# Patient Record
Sex: Female | Born: 1960 | Race: White | Hispanic: No | Smoking: Never smoker
Health system: Southern US, Community
[De-identification: ages and names within clinical notes are randomized; demographics above are authoritative.]

## PROBLEM LIST (undated history)

## (undated) DIAGNOSIS — I10 Essential (primary) hypertension: Secondary | ICD-10-CM

## (undated) DIAGNOSIS — H8109 Meniere's disease, unspecified ear: Secondary | ICD-10-CM

## (undated) DIAGNOSIS — F32A Depression, unspecified: Secondary | ICD-10-CM

## (undated) DIAGNOSIS — D649 Anemia, unspecified: Secondary | ICD-10-CM

## (undated) DIAGNOSIS — E039 Hypothyroidism, unspecified: Secondary | ICD-10-CM

## (undated) DIAGNOSIS — K529 Noninfective gastroenteritis and colitis, unspecified: Secondary | ICD-10-CM

## (undated) DIAGNOSIS — M1990 Unspecified osteoarthritis, unspecified site: Secondary | ICD-10-CM

## (undated) DIAGNOSIS — R52 Pain, unspecified: Secondary | ICD-10-CM

## (undated) DIAGNOSIS — G8929 Other chronic pain: Secondary | ICD-10-CM

## (undated) DIAGNOSIS — E1140 Type 2 diabetes mellitus with diabetic neuropathy, unspecified: Secondary | ICD-10-CM

## (undated) DIAGNOSIS — E7800 Pure hypercholesterolemia, unspecified: Secondary | ICD-10-CM

## (undated) DIAGNOSIS — F419 Anxiety disorder, unspecified: Secondary | ICD-10-CM

## (undated) DIAGNOSIS — K7460 Unspecified cirrhosis of liver: Secondary | ICD-10-CM

## (undated) DIAGNOSIS — M545 Low back pain, unspecified: Secondary | ICD-10-CM

## (undated) DIAGNOSIS — J189 Pneumonia, unspecified organism: Secondary | ICD-10-CM

## (undated) DIAGNOSIS — K5792 Diverticulitis of intestine, part unspecified, without perforation or abscess without bleeding: Secondary | ICD-10-CM

## (undated) DIAGNOSIS — K589 Irritable bowel syndrome without diarrhea: Secondary | ICD-10-CM

## (undated) DIAGNOSIS — K219 Gastro-esophageal reflux disease without esophagitis: Secondary | ICD-10-CM

## (undated) DIAGNOSIS — G629 Polyneuropathy, unspecified: Secondary | ICD-10-CM

## (undated) DIAGNOSIS — K760 Fatty (change of) liver, not elsewhere classified: Secondary | ICD-10-CM

## (undated) DIAGNOSIS — F329 Major depressive disorder, single episode, unspecified: Secondary | ICD-10-CM

## (undated) HISTORY — DX: Anxiety disorder, unspecified: F41.9

## (undated) HISTORY — DX: Other chronic pain: G89.29

## (undated) HISTORY — DX: Depression, unspecified: F32.A

## (undated) HISTORY — DX: Low back pain, unspecified: M54.50

## (undated) HISTORY — DX: Gastro-esophageal reflux disease without esophagitis: K21.9

## (undated) HISTORY — DX: Diverticulitis of intestine, part unspecified, without perforation or abscess without bleeding: K57.92

## (undated) HISTORY — PX: COLONOSCOPY: SHX174

## (undated) HISTORY — DX: Irritable bowel syndrome, unspecified: K58.9

## (undated) HISTORY — PX: OVARIAN CYST REMOVAL: SHX89

## (undated) HISTORY — DX: Fatty (change of) liver, not elsewhere classified: K76.0

## (undated) HISTORY — PX: ABDOMINAL HYSTERECTOMY: SHX81

## (undated) HISTORY — DX: Low back pain: M54.5

## (undated) HISTORY — DX: Essential (primary) hypertension: I10

## (undated) HISTORY — DX: Type 2 diabetes mellitus with diabetic neuropathy, unspecified: E11.40

## (undated) HISTORY — PX: URETERAL STENT PLACEMENT: SHX822

---
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
## 1994-10-06 HISTORY — PX: ECTOPIC PREGNANCY SURGERY: SHX613

## 2001-03-12 ENCOUNTER — Inpatient Hospital Stay (HOSPITAL_COMMUNITY): Admission: EM | Admit: 2001-03-12 | Discharge: 2001-03-15 | Payer: Self-pay | Admitting: Psychiatry

## 2001-06-03 ENCOUNTER — Inpatient Hospital Stay (HOSPITAL_COMMUNITY): Admission: EM | Admit: 2001-06-03 | Discharge: 2001-06-09 | Payer: Self-pay | Admitting: *Deleted

## 2002-09-20 ENCOUNTER — Emergency Department (HOSPITAL_COMMUNITY): Admission: EM | Admit: 2002-09-20 | Discharge: 2002-09-20 | Payer: Self-pay | Admitting: Emergency Medicine

## 2003-02-19 ENCOUNTER — Encounter: Payer: Self-pay | Admitting: Emergency Medicine

## 2003-02-19 ENCOUNTER — Emergency Department (HOSPITAL_COMMUNITY): Admission: EM | Admit: 2003-02-19 | Discharge: 2003-02-19 | Payer: Self-pay | Admitting: Emergency Medicine

## 2003-07-04 ENCOUNTER — Encounter: Payer: Self-pay | Admitting: *Deleted

## 2003-07-04 ENCOUNTER — Emergency Department (HOSPITAL_COMMUNITY): Admission: EM | Admit: 2003-07-04 | Discharge: 2003-07-04 | Payer: Self-pay | Admitting: *Deleted

## 2003-07-08 ENCOUNTER — Emergency Department (HOSPITAL_COMMUNITY): Admission: EM | Admit: 2003-07-08 | Discharge: 2003-07-08 | Payer: Self-pay | Admitting: Emergency Medicine

## 2003-09-02 ENCOUNTER — Emergency Department (HOSPITAL_COMMUNITY): Admission: EM | Admit: 2003-09-02 | Discharge: 2003-09-03 | Payer: Self-pay | Admitting: *Deleted

## 2003-10-07 HISTORY — PX: PARTIAL HYSTERECTOMY: SHX80

## 2003-10-07 HISTORY — PX: OTHER SURGICAL HISTORY: SHX169

## 2003-10-19 ENCOUNTER — Emergency Department (HOSPITAL_COMMUNITY): Admission: EM | Admit: 2003-10-19 | Discharge: 2003-10-20 | Payer: Self-pay | Admitting: *Deleted

## 2003-11-10 ENCOUNTER — Encounter: Payer: Self-pay | Admitting: Cardiology

## 2003-11-14 ENCOUNTER — Ambulatory Visit (HOSPITAL_COMMUNITY): Admission: RE | Admit: 2003-11-14 | Discharge: 2003-11-14 | Payer: Self-pay | Admitting: Urology

## 2003-11-15 ENCOUNTER — Ambulatory Visit (HOSPITAL_COMMUNITY): Admission: RE | Admit: 2003-11-15 | Discharge: 2003-11-15 | Payer: Self-pay | Admitting: Cardiology

## 2003-12-05 ENCOUNTER — Observation Stay (HOSPITAL_COMMUNITY): Admission: RE | Admit: 2003-12-05 | Discharge: 2003-12-06 | Payer: Self-pay | Admitting: Urology

## 2004-03-01 ENCOUNTER — Emergency Department (HOSPITAL_COMMUNITY): Admission: EM | Admit: 2004-03-01 | Discharge: 2004-03-01 | Payer: Self-pay | Admitting: Emergency Medicine

## 2005-04-16 ENCOUNTER — Ambulatory Visit: Payer: Self-pay | Admitting: Internal Medicine

## 2005-04-17 ENCOUNTER — Ambulatory Visit (HOSPITAL_COMMUNITY): Admission: RE | Admit: 2005-04-17 | Discharge: 2005-04-17 | Payer: Self-pay | Admitting: Internal Medicine

## 2005-05-01 ENCOUNTER — Ambulatory Visit: Payer: Self-pay | Admitting: Internal Medicine

## 2005-05-12 ENCOUNTER — Ambulatory Visit: Payer: Self-pay | Admitting: Internal Medicine

## 2006-10-06 HISTORY — PX: UPPER GASTROINTESTINAL ENDOSCOPY: SHX188

## 2007-07-20 ENCOUNTER — Ambulatory Visit: Payer: Self-pay | Admitting: Internal Medicine

## 2007-07-29 ENCOUNTER — Ambulatory Visit (HOSPITAL_COMMUNITY): Admission: RE | Admit: 2007-07-29 | Discharge: 2007-07-29 | Payer: Self-pay | Admitting: Internal Medicine

## 2007-08-04 ENCOUNTER — Ambulatory Visit (HOSPITAL_COMMUNITY): Admission: RE | Admit: 2007-08-04 | Discharge: 2007-08-04 | Payer: Self-pay | Admitting: Internal Medicine

## 2007-08-04 ENCOUNTER — Ambulatory Visit: Payer: Self-pay | Admitting: Internal Medicine

## 2007-08-04 ENCOUNTER — Encounter: Payer: Self-pay | Admitting: Internal Medicine

## 2007-08-12 ENCOUNTER — Ambulatory Visit (HOSPITAL_COMMUNITY): Admission: RE | Admit: 2007-08-12 | Discharge: 2007-08-12 | Payer: Self-pay | Admitting: Internal Medicine

## 2007-10-07 HISTORY — PX: OTHER SURGICAL HISTORY: SHX169

## 2008-09-06 ENCOUNTER — Encounter: Payer: Self-pay | Admitting: Cardiology

## 2008-09-13 ENCOUNTER — Encounter: Payer: Self-pay | Admitting: Cardiology

## 2008-12-28 ENCOUNTER — Encounter: Payer: Self-pay | Admitting: Cardiology

## 2009-01-11 ENCOUNTER — Encounter: Payer: Self-pay | Admitting: Cardiology

## 2010-05-06 HISTORY — PX: CHOLECYSTECTOMY: SHX55

## 2010-07-24 ENCOUNTER — Encounter (INDEPENDENT_AMBULATORY_CARE_PROVIDER_SITE_OTHER): Payer: Self-pay | Admitting: *Deleted

## 2010-10-27 ENCOUNTER — Encounter: Payer: Self-pay | Admitting: Internal Medicine

## 2010-11-05 NOTE — Letter (Signed)
Summary: Recall, Screening Colonoscopy Only  [facility]  [address]   [place], [place] [address]   Phone: [phone]  Fax: [phone]    July 24, 2010  GARGI BERCH [address] [place], [place]  [address] [birth year]   Dear Ms. Fugere,   Our records indicate it is time to schedule your colonoscopy.    Please call our office at [phone] and ask for the nurse.   Thank you,    Burnadette Peter, LPN Waldon Merl, [facility] Ph: [phone]   Fax: [phone]

## 2011-02-18 NOTE — H&P (Signed)
NAMEILHAM, ROUGHTON                ACCOUNT NO.:  [id number]   MEDICAL RECORD NO.:  [id number]         PATIENT TYPE:  AMB   LOCATION:  DAY                           FACILITY:  [facility]   PHYSICIAN:  R. Garfield Cornea, M.D. DATE OF BIRTH:  [birth year]   DATE OF ADMISSION:  DATE OF DISCHARGE:  LH                              HISTORY & PHYSICAL   CHIEF COMPLAINT:  Left upper quadrant pain, abdominal bloating,  diarrhea.   HISTORY OF PRESENT ILLNESS:  Ms. Jacquet is a 50 year old Caucasian  female.  She was previously a patient of Dr. Laural Golden with history of IBS.  She was noted to have Hemoccult-positive stool back in 2006.  She also  has history of chronic GERD.  She was advised to have a colonoscopy and  EGD; however, she has not yet done this.  She also had bout of hepatitis  with acute hepatitis and transaminitis possibly due to the fact that she  was on Lipitor at that time.  HCV RNA was negative. She tells me about 5  weeks ago she developed severe left upper quadrant pain along with  abdominal bloating.  She describes the pain as constant sticking pain.  She rates it 9/10 at worst. She was admitted to the hospital 3 months  ago with similar symptoms that also included nausea and diarrhea as well  as left lower quadrant pain.  She was followed by Dr. Woody Seller at [facility].  She tells me she has upwards of five to seven bowel  movements per day.  Denies any rectal bleeding or melena.  Denies any  mucus in her stool.  She continues to complain of nausea and has been  having vomiting as well.  She is diabetic. Her blood sugars have been  elevated. She describes them in the 160-200 range. She is followed by  Dr. Woody Seller for this as well.  The pain is worsened with eating or  drinking. She said it is relieved with lying prone.  She takes  omeprazole 20 mg daily for her chronic GERD which is well controlled at  this time. She also takes hyoscyamine b.i.d.  This does not seem to help  with her diarrhea.  She denies any foreign travel.  She has chronically  been on antibiotics over the last couple of months. She rarely takes  ibuprofen.  She denies any foreign travel.   PAST MEDICAL AND SURGICAL HISTORY:  1. She has chronic low back pain with her history of herniated disk.  2. Diabetes mellitus.  3. Hypertension.  4. Hyperlipidemia.  5. Anxiety.  6. Chronic GERD for over 4 years.  7. Diabetic neuropathy.  8. History of ectopic pregnancy.  9. Bladder tack.  10.Partial hysterectomy.   CURRENT MEDICATIONS:  1. Xanax  0.5 mg nightly p.r.n.  2. Ibuprofen 600 mg p.r.n.  3. Imitrex p.r.n.  4. Multivitamin daily.  5. Glyburide/metformin 5/500 mg b.i.d.  6. Gabapentin 300 mg 3 times a day.  7. Omeprazole 20 mg daily.  8. Metoprolol 25 mg daily.  9. Hyoscyamine 0.37 mg b.i.d.  10.Tekturna 30 mg  daily.  11.Triamterene/hydrochlorothiazide 37.5/25 mg daily.  12.Meninx daily.  13.TENS unit p.r.n.  14.A fluid pill.  She does not know the name but takes 1/2  p.r.n.   ALLERGIES:  No known drug allergies.   FAMILY HISTORY:  No known family history of carcinoma, liver or chronic  GI problems.  Mother, age 24. has history of coronary artery disease and  respiratory disease; and father deceased at age 53 secondary to MI.  Brother had a history of a lymphoma in remission. Sisters in good  health.   SOCIAL HISTORY:  Ms. Olden is divorced.  She has two healthy children,  ages 55 and 56.  She is a part-time school substitute.  Denies any  tobacco or drug use.  She occasionally drinks a glass of wine.   REVIEW OF SYSTEMS:  See HPI.  CONSTITUTIONAL:  Weight has remained  stable.  Otherwise negative.   PHYSICAL EXAMINATION:  VITAL SIGNS:  Weight 190.5 pounds, height 63  inches.  Temperature 98.6,  blood pressure 124/94, pulse 60.  GENERAL:  Ms. Petterson is an obese Caucasian female who is alert, pleasant,  cooperative, no acute distress.  HEENT:  Conjunctivae pink.  Oropharynx  pink and moist without lesions.  NECK:  Supple without mass or thyromegaly.  HEART:  Regular rate and  rhythm.  Normal S1 and S2. No murmurs, clicks,  rubs or gallops.  LUNGS:  clear to auscultation bilaterally.  ABDOMEN:  Positive bowel sounds x4.  No bruits auscultated.  Soft,  mildly distended.  She has been moderate tenderness left upper quadrant  on deep palpation.  There is no rebound tenderness or guarding.  She  also had tenderness to the left lower rib cage on palpation.  EXTREMITIES:  Without clubbing or edema bilaterally.  SKIN:  Pink, warm and dry.  RECTAL:  No external lesions visualized.  Good sphincter tone.  No  internal masses palpated. Small amount of light brown stool obtained  from vault which is hemoccult positive.   IMPRESSION:  Ms. Weisse is a 50 year old Caucasian female with 5-week  history of left upper quadrant pain along with diarrhea, abdominal  bloating.  I suspect her symptoms could all be related to irritable  bowel syndrome; however, she is found hemoccult positive on exam today.  She also has history of being Hemoccult positive back 2 years ago.  She  never proceeded with colonoscopy as advised.  I would like for her to  proceed with colonoscopy at this time for further evaluation to rule out  inflammatory bowel disease and colorectal carcinoma.  She also has  chronic gastroesophageal reflux disease and should undergo colonoscopy  to live for erosive reflux esophagitis, gastritis, or peptic ulcer  disease as the culprit of her symptoms as well.  We should also rule out  pancreatitis and diverticulitis, although I feel that these are least  likely.   PLAN:  1. Check CBC, LFTs, amylase, lipase, and MET-7.  2. Stool studies to include ova and parasite, culture and sensitivity,      C. difficile and lactoferrin.  3. Colonoscopy and EGD with Dr. Gala Romney in the near future.  I discussed      both procedures including risks, benefits, to include but not       limited to blood, infection, perforation, drug reaction.  This plan      accepted.      Consent obtained.  She should take one-half of her metformin the      day prior  to the procedure.  4. Vicodin 5/500 mg one p.o. q. 4-6 h p.r.n. severe pain, #10 with no      refill.   I would like to thank Dr. Woody Seller for allowing [place] to participate in the  care of Ms. Dobbs.      Vickey Huger, N.P.      Bridgette Habermann, M.D.  Electronically Signed    KJ/MEDQ  D:  07/20/2007  T:  07/20/2007  Job:  [id number]

## 2011-02-18 NOTE — Op Note (Signed)
[name], [name]                ACCOUNT NO.:  [id number]   MEDICAL RECORD NO.:  [id number]          PATIENT TYPE:  AMB   LOCATION:  DAY                           FACILITY:  [facility]   PHYSICIAN:  R. Garfield Cornea, M.D. DATE OF BIRTH:  05/04/1961   DATE OF PROCEDURE:  08/04/2007  DATE OF DISCHARGE:                               OPERATIVE REPORT   PROCEDURE:  Esophagogastroduodenoscopy with small bowel biopsy followed  by ileocolonoscopy and segmental biopsies, snare polypectomy.   INDICATIONS FOR PROCEDURE:  A 50 year old lady with upper abdominal  pain, 82-monthhistory of watery nonbloody diarrhea, Hemoccult positive.  She has diabetes. EGD and colonoscopy now being done.  This approach has  discussed with the patient at length.  Potential risks, benefits and  alternatives have been reviewed, questions answered.  She is agreeable.  Please see documentation in the medical record.   PROCEDURE NOTE:  O2 saturation, blood pressure, pulse and respirations  were monitored throughout the entire procedure.   CONSCIOUS SEDATION:  Versed 6 mg IV, Demerol 150 mg IV in divided doses.   INSTRUMENT:  Pentax video chip system.   Cetacaine spray for topical pharyngeal anesthesia.   FINDINGS:  EGD:  Examination tubular esophagus revealed no mucosal  abnormalities, EG junction easily traversed.  Stomach:  Gas cavity was emptied, insufflated well with air.  Thorough  examination of gastric mucosa including retroflex view of the proximal  stomach, esophagogastric junction demonstrated only a small hiatal  hernia.  Pylorus patent, easily traversed, examination of the bulb,  second, third portion revealed some white tiny specks overlying the  small bowel mucosa which not would not wash or rub off, please see  photos.  There was no scalloping or other abnormality.   THERAPEUTIC/DIAGNOSTIC MANEUVERS PERFORMED:  Multiple biopsies D2 and D3  were taken.  The patient tolerated the procedure well and was  prepared  for colonoscopy.   Digital rectal exam revealed no abnormalities.   ENDOSCOPIC FINDINGS:  The prep was suboptimal but doable.  Colon:  Colonic mucosa was surveyed from rectosigmoid junction through  the left, transverse, right colon to this appendiceal orifice and  ileocecal valve and cecum.  These structures well seen and photographed  for the record.  The ileocecal valve was patulous, but otherwise  appeared normal.  The terminal ileum was easily intubated 10 cm.  From  this level scope was slowly withdrawn.  All previously mentioned mucosal  surfaces were again seen.  The patient had sigmoid diverticula.  The  remainder of the colonic mucosa appeared normal except for 4 mm  pedunculated polyp mid ascending colon which was cold snared, recovered  through scope.  The terminal ileal mucosa appeared normal.  Single  biopsies of the ascending, descending and sigmoid were taken to rule out  microscopic colitis.  Scope was then pulled down to the rectum where  thorough examination of rectal mucosa including retroflex view of the  anal verge demonstrated no abnormalities.  Rectal mucosa was biopsied  separately.  The patient tolerated both procedures well and was reacted  in endoscopy.   IMPRESSION:  Esophagogastroduodenoscopy:  Normal esophagus, small hiatal  hernia, otherwise normal stomach, patent pylorus D1 through D3 closely  examined and tiny white specks, hundreds of them lining part of the  mucosa D2 and D3.  These could be prominent lymphatics versus other  abnormality status post biopsy.   COLONOSCOPY FINDINGS:  1. Normal rectum.  2. Sigmoid diverticula.  Remainder of colonic mucosa appeared normal      aside from ascending colon polyp which was removed cold snare,      status post segmental biopsy.  3. Patulous but otherwise normal-appearing ileocecal valve, normal      terminal ileum.   RECOMMENDATIONS:  Follow up on pending histology, make further   recommendations to follow in very near future.      [name], M.D.  Electronically Signed     RMR/MEDQ  D:  08/04/2007  T:  08/04/2007  Job:  [id number]   cc:   [name]  Fax: [phone]

## 2011-02-21 NOTE — Discharge Summary (Signed)
[facility]  Patient:    [name], [name]                         MRN: [id number] Adm. Date:  [id number] Disc. Date: [id number] Attending:  Carlton Adam A Dictator:   Harlin Rain. Orsini, N.P.                           Discharge Summary  PATIENT IDENTIFICATION:  This is a 50 year old divorced white female voluntarily admitted for depression and suicide attempt with patient overdosing on multiple medications.  The patient presents with a history of depression for approximately six months, having several stressors with relationships, being unemployed and poor family support. The patient feels very hopeless and worthless. The patient states she took whatever pill she could get; took three Paxil, two or three trazodone, five or six unknown white pills that her boyfriend had bought on the streets.  The patient was asleep and found by her child.  She was taken to the emergency room.  The patient reports that she only wanted "to sleep and not wake up."  There was no suicide note left.  The patient reports several other prior overdoses, never been hospitalized, with the last one prior to this admission.  The patient reports she sleeps when she takes her medications.  Her appetite has been good.  She denies any psychotic symptoms.  The patient goes to [facility] on an outpatient basis and sees Rhoderick Moody, N.P., for the past six months.  PAST MEDICAL HISTORY:  Primary care Dariona Postma is Dr. Evie Lacks, in [place], [place].  Medical problems:  None.  MEDICATIONS: 1. Paxil CR 50 mg q.d. 2. Trazodone 50 mg q.h.s.  ALLERGIES:  No known drug allergies.  PHYSICAL FINDINGS:  Urine drug screen was positive for cocaine.  CMET and CBC were within normal limits.  Urine showed positive protein, positive blood.  EKG was normal.  MENTAL STATUS EXAMINATION:  She is alert, overweight Caucasian female, casually dressed, good eye contact and cooperative.  Speech is normal and relevant.  Mood is depressed.  Affect is apathetic.  Thought processes are coherent. There is no evidence of psychosis.  No hallucinations.  No current homicidal or suicidal ideations, no paranoia.  Cognitive functioning is intact. She is oriented x 3.  Memory is good.  Judgement is poor.  Insight is poor.  Poor impulse control.  Appears to be reliable.  ADMISSION DIAGNOSES: Axis I.   1. Major depression with suicide attempt.           2. Polysubstance abuse. Axis II.  Deferred. Axis III. None. Axis IV.  Severe with problems related to primary support group,           occupation and Conservation officer, nature. Axis V.   Current is 41, past year is 60.  HOSPITAL COURSE:  This is a voluntary admission.  The patient will be monitored every 15 minutes and will resume her routine medications and obtain further labs. Will get a urine culture and sensitivity.  The patient initially was very sad and depressed and remaining apathetic with a flat affect. Discussions were undertaken with her about overdosing that were resulted in her admission.  The patient reported that she was not having thoughts about herself and talked about her multiple psychosocial stressors. The patient began to improve, becoming less depressed and brighter affect. She was denying any suicidal ideation and was wanting  to go home to be with her children and felt better able to handle her stressors.  Her Paxil was increased.  CONDITION ON DISCHARGE:  The patient was feeling much better. She had a good visit with her children and felt that she was ready for discharge and was going to follow up with outpatient therapy at [facility].  The patient was denying any homicidal or suicidal ideations.  FOLLOW-UP:  This is to be at [facility] on March 16, 2001, at 9:30 to see Lorin Glass.  DISCHARGE MEDICATIONS: 1. Paxil CR 25 mg two in the morning. 2. Trazodone 50 mg one to two p.r.n. for  sleep.  DISCHARGE DIAGNOSES: Axis I.   1. Major depression with suicide attempt.           2. Polysubstance abuse. Axis II.  Deferred. Axis III. None. Axis IV.  Severe psychosocial stressors. Axis V.   Current is 60, past year is 69. DD:  04/19/01 TD:  04/19/01 Job: [id number]

## 2011-02-21 NOTE — Procedures (Signed)
NAME:  [name], [name]                          ACCOUNT NO.:  [id number]   MEDICAL RECORD NO.:  [id number]                   PATIENT TYPE:  OUT   LOCATION:  RAD                                  FACILITY:  [facility]   PHYSICIAN:  Jacqulyn Ducking, M.D.               DATE OF BIRTH:  November 11, 1960   DATE OF PROCEDURE:  DATE OF DISCHARGE:                                  ECHOCARDIOGRAM   </REFERRING PHYSICIANS>  Dr. Oleta Mouse.  Dr. Brandon Melnick D. Fanta.   CLINICAL DATA:  A 50 year old woman with chest pain; urologic surgery  planned.   FINDINGS:  1. Baseline echocardiographic images revealed normal chamber dimensions.     The aortic, mitral and tricuspid valves are normal.  Left ventricular     size is normal with normal wall thickness and normal function.  2. Upright treadmill exercise performed to a workload of 10 mets, and a     heart rate of 194, 109% of age-predicted maximum.  Exercise discontinued     due to dyspnea and fatigue; no chest pain reported.  Blood pressure increased from a resting value of 130/85 to 200/100.  At peak  exercise, a minimally hypertensive response.  No arrhythmias noted.   BASELINE EKG:  1. Normal sinus rhythm.  2. Within normal limits.   STRESS EKG:  Insignificant upsloping ST-segment depression.   POST-EXERCISE ECHOCARDIOGRAPHIC IMAGES:  Hyperdynamic function in all  segments.   IMPRESSION:  1. Negative and adequate stress echocardiogram revealing adequate exercise     tolerance, a normal stress EKG, a mildly hypertensive response to     exertion, and no echocardiographic evidence for ischemic or infarction.  2. Other findings as noted.      ___________________________________________                                            Jacqulyn Ducking, M.D.   RR/MEDQ  D:  11/15/2003  T:  11/15/2003  Job:  [id number]

## 2011-02-21 NOTE — Op Note (Signed)
NAME:  [name], [name]                          ACCOUNT NO.:  [id number]   MEDICAL RECORD NO.:  [id number]                   PATIENT TYPE:  AMB   LOCATION:  DAY                                  FACILITY:  [facility]   PHYSICIAN:  Marissa Nestle, M.D.            DATE OF BIRTH:  1961/05/05   DATE OF PROCEDURE:  DATE OF DISCHARGE:                                 OPERATIVE REPORT   PREOPERATIVE DIAGNOSES:  Stress urinary incontinence.   POSTOPERATIVE DIAGNOSES:  Stress urinary incontinence.   PROCEDURE:  Tension free vaginal tape.   ANESTHESIA:  General.   DESCRIPTION OF PROCEDURE:  The patient was given general anesthesia, placed  in lithotomy position, abdomen prepped and draped.  The suprapubic areas  were marked at the level of the pubic tubercle.  A #20 Foley catheter  inserted and then mid urethra infiltrated with 10 mL of normal saline in the  periureteral area.  A 1 cm long incision of the urethra is made ending about  1.5 cm proximal to the ureteral meatus with blunt dissection.  The  periurethral area was developed enough to accommodate the tip of the trocar.  The trocar with the blue guard is introduced and I have removed the Foley  catheter and inserted a catheter with a Stylet in it to deflect the bladder  neck to the right side and the trocar is introduced left of the mid urethra  into the retropubic space and hugging the back of the pubic symphysis came  out at the level of the pubic tubercle.  With the trocar in place, the Foley  catheter was removed, the bladder was filled up with 300 mL of saline.  It  was inspected to make sure the trocar is outside the bladder.  Once that was  established, the trocar along with the cord is pulled in the suprapubic  area.  Part of the trocar is retained, the other part is cut and the trocar  is introduced in the same exact fashion on the right side. The blue guard is  in on both sides, I have already checked the results in the bladder.  The end  of the tension free vaginal tape is attached to the blue cord on the vaginal  side and both ends of the blue cord pulled up in the suprapubic area pulling  the ends of the tape up in the suprapubic area.  The tape is separated in  the midline and with the Mayo scissor in between the tape and the Foley  catheter, the redundancy from the tape is removed now the tape is lying  gently against the floor of the urethra over the Mayo scissors. After  removing the redundancy from the tape, adjusting the tension of the tape,  the plastic ring of the tape was removed.  The scissor is removed and the  position of the tape is checked, it is lying gently  against the urethra. The  vaginal wound is irrigated with saline and closed with three stitches of 3-0  Vicryl.  The redundant part of the tape in the suprapubic area cut flush  with the skin and Steri-Strips applied to the suprapubic incisions. The  bladder is emptied, Foley catheter is removed. The patient left the  operating room in satisfactory condition. She was given 500 mg of Levaquin  during the procedure.      ___________________________________________                                            Marissa Nestle, M.D.   MIJ/MEDQ  D:  12/05/2003  T:  12/05/2003  Job:  [id number]

## 2011-02-21 NOTE — H&P (Signed)
NAME:  [name], [name]                            ACCOUNT NO.:  [id number]   MEDICAL RECORD NO.:  [id number]                  PATIENT TYPE:   LOCATION:                                       FACILITY:   PHYSICIAN:  Marissa Nestle, M.D.            DATE OF BIRTH:   DATE OF ADMISSION:  DATE OF DISCHARGE:                                HISTORY & PHYSICAL   CHIEF COMPLAINT:  Urinary incontinence.   HISTORY OF PRESENT ILLNESS:  This 50 year old female was seen by me January  3rd with mild symptoms of incontinence, both stress and urge incontinence.  She was treated with Detrol, but had more problem with stress incontinence.  She has to use several pads every day and I have recommended that she  undergo TVT procedure.  She was scheduled and came to have the procedure;  and was found to have abnormal EKG, so the procedure was cancelled.  A  cardiology consultation was obtained and now she has been cleared,  cardiology-wise and they want me to, that I go ahead and proceed with the  surgery.  So, she is coming as an outpatient tomorrow to undergo TVT.  I  have discussed the procedure, its limitation and complications.  No  guarantees about the results. I have discussed especially urinary retention,  leading to removal of the tape.  She understands and wants me to proceed.   PAST HISTORY:  1. Ectopic pregnancy 1996.  2. Anxiety.   MEDICATIONS:  Ativan and Ambien.   SOCIAL HISTORY:  She does not smoke or drink.   REVIEW OF SYSTEMS:  Low grade hypertension, but she is not taking any  medications.  No chest pain, orthopnea, PND, nausea, or vomiting.   PHYSICAL EXAMINATION:  VITAL SIGNS:  On examination blood pressure 150/90.  Temperature is normal.  CENTRAL NERVOUS SYSTEM:  No gross neurological deficit.  HEAD AND NECK, AND ENT:  Negative.  CHEST:  Symmetrical.  HEART:  Regular sinus rhythm.  ABDOMEN:  Soft and flat.  Liver, spleen, and kidneys are not palpable.  No  CVA tenderness.  PELVIC:  No adnexal mass or tenderness.   IMPRESSION:  Stress urinary incontinence.   PLAN:  TVT under anesthesia as an outpatient.    ___________________________________________                                         Marissa Nestle, M.D.   MIJ/MEDQ  D:  12/04/2003  T:  12/04/2003  Job:  [id number]

## 2011-02-21 NOTE — H&P (Signed)
[place]  Patient:    [name], [name]                         MRN: [id number] Adm. Date:  03/12/01 Attending:  Geralyn Flash A. Sabra Heck, M.D. Dictator:   Harlin Rain. Kendrick Fries, N.P.                   Psychiatric Admission Assessment  DATE OF ADMISSION:  March 12, 2001  PATIENT IDENTIFICATION:  This is a 50 year old divorced white female voluntarily admitted on March 12, 2001, for depression and suicide attempt, the patient overdosing on multiple medications.  HISTORY OF PRESENT ILLNESS:  The patient presents with a history of depression for approximately six months.  The patient has been experiencing several stressors with relationship problems, the patient being unemployed, poor family support.  The patient is feeling very hopeless and worthless.  The patient states she took whatever pills she could get; she took three Paxil, two to three trazodone, and five to six unknown white pills that her boyfriend had bought off the street.  The patient went to sleep.  She was found by her child.  Child called her sisters and 55 and the patient was taken to the emergency room.  The patient reports that she "wanted to sleep and not wake up."  She did not leave a note.  The patient reports she has had several prior overdoses, has never been hospitalized; she had one last Friday and six weeks prior.  The patient does state that she sleeps when she takes her medications, that her appetite has been good.  She denies any auditory or visual hallucinations, no paranoia.  The patient promises safety on the unit.  PAST PSYCHIATRIC HISTORY:  The patient goes to [facility].  She has seen Chapman Moss, nurse practitioner, for the past six months, has been seeing her for depressive symptoms.  This is her first hospitalization.  The patient, as stated, does report several other overdoses. The patient just usually sleeps off her medications and she states  her intention is to sleep and not wake up.  SUBSTANCE ABUSE HISTORY:  She is a nonsmoker.  She reports a mixed drink at supper.  She states alcohol is not a problem.  She uses cocaine about $40 worth, she used two days ago.  She smokes the cocaine.  PAST MEDICAL HISTORY:  Primary care Traylen Eckels is Dr. Evie Lacks in [place].  He is an Equities trader.  Medical problems: None.  Medications: Paxil CR 50 mg every day, she has been on that for about a month, has no side effects.  Trazodone 50 mg q.h.s., she finds this effective for sleep.  Drug allergies: No known drug allergies.  Physical examination was performed at [facility]. Urine drug screen was positive for cocaine.  CMET and CBC were within normal limits.  EKG was normal.  Urine showed positive protein and positive blood.  SOCIAL HISTORY:  She is a 50 year old divorced white female.  This was her second marriage.  She has been divorced for two and a half years.  She was married for nine years with the second marriage.  She has two children ages 35 and 48.  She lives with her children.  She has been living off child support. She has no legal problems.  She has completed two years of college.  She lost her job in February when she had to take time off to take care  of her children.  Family history: None.  MENTAL STATUS EXAMINATION:  Alert, overweight Caucasian female, casually dressed.  Good eye contact, cooperative.  Speech is normal and relevant.  Mood is depressed.  Affect is apathetic.  Thought processes are coherent.  No evidence of psychosis, no auditory or visual hallucinations, no current suicidal or homicidal ideation, no paranoia.  Cognitive functioning is intact. Oriented x 3.  Memory is good.  Judgment is poor.  Insight is poor.  Poor impulse control.  Appears to be reliable.  ADMISSION DIAGNOSES: Axis I:    1. Major depression with suicidal attempt.            2. Polysubstance abuse. Axis II:   Deferred. Axis III:   None. Axis IV:   Severe with problems related to primary support group, occupation,            and Conservation officer, nature. Axis V:    Current is 74, this past year is 64.  INITIAL PLAN OF CARE:  Voluntary admission to [facility] for depression and suicide attempt.  Contract for safety, check every 15 minutes; the patient promises safety.  Will resume her routine medications.  Will obtain labs.  Will repeat her urine and get a culture and sensitivity.  Plan is to return the patient to her prior living arrangement and to improve her mood and thinking so the patient can be safe.  ESTIMATED LENGTH OF STAY:  Three to five days. DD:  03/12/01 TD:  03/12/01 Job: [id number]

## 2011-07-21 ENCOUNTER — Encounter (INDEPENDENT_AMBULATORY_CARE_PROVIDER_SITE_OTHER): Payer: Self-pay | Admitting: Internal Medicine

## 2011-07-21 ENCOUNTER — Ambulatory Visit (INDEPENDENT_AMBULATORY_CARE_PROVIDER_SITE_OTHER): Payer: Medicaid Other | Admitting: Internal Medicine

## 2011-07-21 DIAGNOSIS — K7689 Other specified diseases of liver: Secondary | ICD-10-CM

## 2011-07-21 DIAGNOSIS — R7401 Elevation of levels of liver transaminase levels: Secondary | ICD-10-CM

## 2011-07-21 DIAGNOSIS — R7402 Elevation of levels of lactic acid dehydrogenase (LDH): Secondary | ICD-10-CM

## 2011-07-21 DIAGNOSIS — K760 Fatty (change of) liver, not elsewhere classified: Secondary | ICD-10-CM

## 2011-07-21 NOTE — Patient Instructions (Signed)
Imodium OTC 2 mg at bedtime for a few days. If diarrhea persist take acting does every morning. Physician will contact you with results of your bloodwork

## 2011-07-22 ENCOUNTER — Telehealth (INDEPENDENT_AMBULATORY_CARE_PROVIDER_SITE_OTHER): Payer: Self-pay | Admitting: *Deleted

## 2011-07-22 LAB — HEPATIC FUNCTION PANEL
ALT: 47 U/L — ABNORMAL HIGH (ref 0–35)
AST: 105 U/L — ABNORMAL HIGH (ref 0–37)
Albumin: 4 g/dL (ref 3.5–5.2)
Alkaline Phosphatase: 57 U/L (ref 39–117)
Bilirubin, Direct: 0.1 mg/dL (ref 0.0–0.3)
Indirect Bilirubin: 0.2 mg/dL (ref 0.0–0.9)
Total Bilirubin: 0.3 mg/dL (ref 0.3–1.2)
Total Protein: 7.9 g/dL (ref 6.0–8.3)

## 2011-07-22 NOTE — Progress Notes (Signed)
Presenting complaint;diarrhea and fatty liver. Subjective; patient is 50 year old Caucasian female who is in for yearly visit. She continues to complain of diarrhea. Her last colonoscopy was in October 2008 with removal of 4 mm tubular adenoma. She is felt to have irritable bowel syndrome but had no benefit with dicyclomine, hyoscyamine or high fiber diet. She has 6-8 bowel movements daily.46 of these occur every morning between 4 and 8 AM. All of her stools are loose and watery. She has urgency. She denies melena or rectal bleeding or nocturnal diarrhea. She is not having any more abdominal pain or nausea and vomiting that she had last year. She says her heartburn is well controlled with therapy but off PPI her symptoms relapse within 48 hours. She has gained 5 pounds in the last 15 months. She believes is because of steroid shots for Mnire's disease.she says she's not able to do much exercise because of peripheral neuropathy. . Current medications Current Outpatient Prescriptions  Medication Sig Dispense Refill  . aliskiren (TEKTURNA) 300 MG tablet Take 300 mg by mouth daily.        . diazepam (VALIUM) 2 MG tablet Take 2 mg by mouth as needed.        Marland Kitchen estrogens, conjugated, (PREMARIN) 0.9 MG tablet Take 0.9 mg by mouth daily. Take daily for 21 days then do not take for 7 days.       Marland Kitchen glyBURIDE-metformin (GLUCOVANCE) 5-500 MG per tablet Take 1 tablet by mouth 3 (three) times daily before meals.        . meclizine (ANTIVERT) 25 MG tablet Take 25 mg by mouth as needed.        . Multiple Vitamin (ONE-DAILY MULTI VITAMINS PO) Take by mouth 1 day or 1 dose.        Marland Kitchen acetaZOLAMIDE (DIAMOX) 250 MG tablet Take 250 mg by mouth daily.        . DULoxetine (CYMBALTA) 30 MG capsule Take 30 mg by mouth daily.        . fluticasone (FLONASE) 50 MCG/ACT nasal spray Place 1 spray into the nose daily.        . metoprolol (LOPRESSOR) 50 MG tablet Take 50 mg by mouth 2 (two) times daily.        Marland Kitchen omeprazole  (PRILOSEC) 40 MG capsule Take 40 mg by mouth daily.        Past medical history. Is been diabetic for 4 years. Recent hemoglobin A1c was 7.1. Chronic GERD.EGD 4 years ago was normal. Hyperlipidemia. Stress disorder Hypertension. peripheral neuropathy. Chronic diarrhea felt to be due to IBS. Last colonoscopy was in October 2008 with removal of 4 mm tubular adenoma. Sigmoid colon biopsy negative for colitis. He also had duodenal biopsy at the time of EGD negative for celiac disease. Elevated transaminases felt to be due to fatty liver. Markers for hepatitis B and C  have been negative. Chronic  low back pain due to herniated disc.  Surgery for ectopic pregnancy, tacking up of bladder and hysterectomy Allergies to Flagyl. Objective; BP 120/74  Pulse 76  Temp(Src) 98.2 F (36.8 C) (Oral)  Resp 14  Ht 5' 2"  (1.575 m)  Wt 184 lb (83.462 kg)  BMI 33.65 kg/m2 Conjunctiva is pink. Sclerae nonicteric. Oral pharyngeal mucosa is normal. No neck masses or thyromegaly noted. Abdomen is full bowel sounds are hyperactive. Palpation reveals soft abdomen without tenderness or masses. Liver edge is indistinct. No peripheral edema noted. Assessment #1. Chronic diarrhea. She has had fairly extensive workup  in the past and felt to have IBS although she is not responded to therapy.  Diabetic diarrhea less likely given her symptoms. #2. Mildly elevated transaminases secondary to fatty liver. No stigmata of chronic liver disease. Plan Imodium OTC 2 mg daily at bedtime and if she has no improvement she'll take a second dose in the morning. LFTs.

## 2011-07-22 NOTE — Telephone Encounter (Signed)
Patient was seen in our office on 07-21-11 ,she was not sure about the medications she was taking and the mg of each. After getting home she called and gave a list of them and the correct mg, I have updated this in her chart.

## 2011-08-12 ENCOUNTER — Telehealth (INDEPENDENT_AMBULATORY_CARE_PROVIDER_SITE_OTHER): Payer: Self-pay | Admitting: *Deleted

## 2011-08-12 DIAGNOSIS — K760 Fatty (change of) liver, not elsewhere classified: Secondary | ICD-10-CM

## 2011-08-12 DIAGNOSIS — R112 Nausea with vomiting, unspecified: Secondary | ICD-10-CM

## 2011-08-12 DIAGNOSIS — K589 Irritable bowel syndrome without diarrhea: Secondary | ICD-10-CM

## 2011-08-12 NOTE — Telephone Encounter (Signed)
Per NUR the patient will need labs drawn in 6 months, labs noted for May 2013.

## 2011-09-17 HISTORY — PX: HERNIA REPAIR: SHX51

## 2011-12-09 DIAGNOSIS — H8109 Meniere's disease, unspecified ear: Secondary | ICD-10-CM | POA: Insufficient documentation

## 2012-01-08 ENCOUNTER — Encounter (INDEPENDENT_AMBULATORY_CARE_PROVIDER_SITE_OTHER): Payer: Self-pay | Admitting: *Deleted

## 2012-02-05 ENCOUNTER — Encounter (INDEPENDENT_AMBULATORY_CARE_PROVIDER_SITE_OTHER): Payer: Self-pay | Admitting: *Deleted

## 2012-02-09 ENCOUNTER — Other Ambulatory Visit (INDEPENDENT_AMBULATORY_CARE_PROVIDER_SITE_OTHER): Payer: Self-pay | Admitting: Internal Medicine

## 2012-02-09 ENCOUNTER — Other Ambulatory Visit (INDEPENDENT_AMBULATORY_CARE_PROVIDER_SITE_OTHER): Payer: Self-pay | Admitting: *Deleted

## 2012-02-09 DIAGNOSIS — R112 Nausea with vomiting, unspecified: Secondary | ICD-10-CM

## 2012-02-09 DIAGNOSIS — K589 Irritable bowel syndrome without diarrhea: Secondary | ICD-10-CM

## 2012-02-09 DIAGNOSIS — K760 Fatty (change of) liver, not elsewhere classified: Secondary | ICD-10-CM

## 2012-02-09 LAB — HEPATIC FUNCTION PANEL
ALT: 37 U/L — ABNORMAL HIGH (ref 0–35)
AST: 85 U/L — ABNORMAL HIGH (ref 0–37)
Albumin: 3.8 g/dL (ref 3.5–5.2)
Alkaline Phosphatase: 51 U/L (ref 39–117)
Bilirubin, Direct: <0.1 mg/dL (ref 0.0–0.3)
Total Bilirubin: 0.2 mg/dL — ABNORMAL LOW (ref 0.3–1.2)
Total Protein: 7.6 g/dL (ref 6.0–8.3)

## 2012-02-24 ENCOUNTER — Ambulatory Visit (INDEPENDENT_AMBULATORY_CARE_PROVIDER_SITE_OTHER): Payer: Medicaid Other | Admitting: Internal Medicine

## 2012-02-24 ENCOUNTER — Encounter (INDEPENDENT_AMBULATORY_CARE_PROVIDER_SITE_OTHER): Payer: Self-pay | Admitting: Internal Medicine

## 2012-02-24 VITALS — BP 112/70 | HR 76 | Temp 97.8°F | Resp 20 | Ht 62.0 in | Wt 170.2 lb

## 2012-02-24 DIAGNOSIS — R7402 Elevation of levels of lactic acid dehydrogenase (LDH): Secondary | ICD-10-CM

## 2012-02-24 DIAGNOSIS — K760 Fatty (change of) liver, not elsewhere classified: Secondary | ICD-10-CM | POA: Insufficient documentation

## 2012-02-24 DIAGNOSIS — K589 Irritable bowel syndrome without diarrhea: Secondary | ICD-10-CM | POA: Insufficient documentation

## 2012-02-24 DIAGNOSIS — E1165 Type 2 diabetes mellitus with hyperglycemia: Secondary | ICD-10-CM | POA: Insufficient documentation

## 2012-02-24 DIAGNOSIS — K7689 Other specified diseases of liver: Secondary | ICD-10-CM

## 2012-02-24 DIAGNOSIS — IMO0002 Reserved for concepts with insufficient information to code with codable children: Secondary | ICD-10-CM | POA: Insufficient documentation

## 2012-02-24 MED ORDER — RIFAXIMIN 200 MG PO TABS: 400.0000 mg | ORAL_TABLET | Freq: Three times a day (TID) | ORAL | Status: AC

## 2012-02-24 NOTE — Patient Instructions (Addendum)
Call with progress report when you finish antibiotic. LFTs in 6 months or earlier if you haven't lost another 5 pounds his Xifaxan 400 mg by mouth 3 times a day for 14 days

## 2012-02-24 NOTE — Progress Notes (Signed)
Presenting complaint;  Follow for elevated transaminases abdominal pain and diarrhea. Subjective:  [name] is a 51 year old Caucasian female is here for scheduled visit. She was last seen in October 2012. Since that visit she has lost 14 pounds. She says she has quit eating fast foods and drinks water and also eating less bread. She believes Victoza also has help with weight loss. Her last hemoglobin was 6.1. She is having problems with vertigo and has been referred by her ENT specialist or neurologist at [facility]. She continues to complain of left-sided abdominal pain and diarrhea with 3-4 bowel movements per day urgency and accidents. She denies melena or rectal bleeding. She remains with good appetite. She is on fiber rich diet. She occasionally takes ibuprofen for back pain. She has never been constipated.   Current Medications: Current Outpatient Prescriptions  Medication Sig Dispense Refill  . acetaZOLAMIDE (DIAMOX) 250 MG tablet Take 250 mg by mouth daily.        Marland Kitchen aliskiren (TEKTURNA) 300 MG tablet Take 300 mg by mouth daily.        . diazepam (VALIUM) 2 MG tablet Take 2 mg by mouth as needed.        . DULoxetine (CYMBALTA) 30 MG capsule Take 30 mg by mouth daily.        Marland Kitchen estrogens, conjugated, (PREMARIN) 0.9 MG tablet Take 0.9 mg by mouth daily.       . fluticasone (FLONASE) 50 MCG/ACT nasal spray Place 1 spray into the nose daily.        Marland Kitchen glyBURIDE-metformin (GLUCOVANCE) 5-500 MG per tablet Take 1 tablet by mouth 3 (three) times daily before meals.        Marland Kitchen ibuprofen (ADVIL,MOTRIN) 600 MG tablet Take 500 mg by mouth as needed.       . meclizine (ANTIVERT) 25 MG tablet Take 25 mg by mouth as needed.        . metoprolol (LOPRESSOR) 50 MG tablet Take 50 mg by mouth. Patient takes 1/2 tablet daily      . Multiple Vitamin (ONE-DAILY MULTI VITAMINS PO) Take by mouth 1 day or 1 dose.        Marland Kitchen omeprazole (PRILOSEC) 40 MG capsule Take 40 mg by mouth daily.        . ondansetron (ZOFRAN-ODT) 8 MG  disintegrating tablet 8 mg as needed.       Marland Kitchen VICTOZA 18 MG/3ML SOLN Inject 1.2 mg into the skin daily.       . rifaximin (XIFAXAN) 200 MG tablet Take 2 tablets (400 mg total) by mouth 3 (three) times daily.  84 tablet  0     Objective: Blood pressure 112/70, pulse 76, temperature 97.8 F (36.6 C), temperature source Oral, resp. rate 20, height 5' 2"  (1.575 m), weight 170 lb 3.2 oz (77.202 kg). Conjunctiva is pink. Sclera is nonicteric Oropharyngeal mucosa is normal. No neck masses or thyromegaly noted. Cardiac exam with regular rhythm normal S1 and S2. No murmur or gallop noted. Lungs are clear to auscultation. Abdomen is soft. Mild tenderness noted in LLQ. No organomegaly or masses noted. Liver edges and is staying and span is 14-15 cm pre-  No LE edema or clubbing noted.  Labs/studies Results: LFTs from 02/09/2012 bilirubin oh 0.2, AP 51, AST 85, ALT 37 albumin 3.8. AST and ALT were 105 and 47 respectively on 07/21/2011.    Assessment:  #1. Elevated transaminases secondary to fatty liver. These are first noted about 6 years ago and all biochemical markers  were negative. No stigmata of chronic liver disease. Will continue to monitor. #2. IBS. She has not responded to any therapy. Duodenal biopsy in October 2008 was negative for celiac disease and colonic biopsy was negative for collagenous or microscopic colitis. She has failed anti-spasmodic Imodium and Lomotil. We will try her on Xifaxan prior to considering TCAs.   Plan:  Xifaxan 400 mg by mouth 3 times a day for 14 days. Progress report for an antibiotic finished. LFTs in 6 months or earlier if she has lost another 5 pounds. Office visit in one year.

## 2012-04-22 ENCOUNTER — Other Ambulatory Visit (INDEPENDENT_AMBULATORY_CARE_PROVIDER_SITE_OTHER): Payer: Self-pay | Admitting: Internal Medicine

## 2012-04-22 MED ORDER — DICYCLOMINE HCL 10 MG PO CAPS: 10.0000 mg | ORAL_CAPSULE | Freq: Two times a day (BID) | ORAL | Status: DC

## 2013-02-16 ENCOUNTER — Encounter (INDEPENDENT_AMBULATORY_CARE_PROVIDER_SITE_OTHER): Payer: Self-pay | Admitting: *Deleted

## 2013-03-03 ENCOUNTER — Ambulatory Visit (INDEPENDENT_AMBULATORY_CARE_PROVIDER_SITE_OTHER): Payer: Medicaid Other | Admitting: Internal Medicine

## 2013-05-01 DIAGNOSIS — R079 Chest pain, unspecified: Secondary | ICD-10-CM

## 2013-08-03 ENCOUNTER — Encounter (INDEPENDENT_AMBULATORY_CARE_PROVIDER_SITE_OTHER): Payer: Self-pay | Admitting: *Deleted

## 2013-08-15 ENCOUNTER — Ambulatory Visit (INDEPENDENT_AMBULATORY_CARE_PROVIDER_SITE_OTHER): Payer: Medicaid Other | Admitting: Internal Medicine

## 2013-08-15 ENCOUNTER — Encounter (INDEPENDENT_AMBULATORY_CARE_PROVIDER_SITE_OTHER): Payer: Self-pay | Admitting: Internal Medicine

## 2013-08-15 VITALS — BP 118/90 | HR 76 | Temp 97.4°F | Resp 18 | Ht 62.0 in | Wt 170.8 lb

## 2013-08-15 DIAGNOSIS — K589 Irritable bowel syndrome without diarrhea: Secondary | ICD-10-CM

## 2013-08-15 DIAGNOSIS — K760 Fatty (change of) liver, not elsewhere classified: Secondary | ICD-10-CM

## 2013-08-15 DIAGNOSIS — R197 Diarrhea, unspecified: Secondary | ICD-10-CM

## 2013-08-15 DIAGNOSIS — K7689 Other specified diseases of liver: Secondary | ICD-10-CM

## 2013-08-15 MED ORDER — DIPHENOXYLATE-ATROPINE 2.5-0.025 MG PO TABS: 1.0000 | ORAL_TABLET | Freq: Two times a day (BID) | ORAL | Status: DC

## 2013-08-15 MED ORDER — DICYCLOMINE HCL 10 MG PO CAPS: 10.0000 mg | ORAL_CAPSULE | Freq: Two times a day (BID) | ORAL | Status: DC

## 2013-08-15 NOTE — Patient Instructions (Addendum)
Keep stool diary as to frequency and consistency of stools until next visit. If you develop constipation use suppository or fleet enema but do not take OTC laxatives. Please also keep record of walking on treadmill until next office visit

## 2013-08-15 NOTE — Progress Notes (Signed)
Presenting complaint;  Diarrhea, right lower quadrant abdominal pain and elevated transaminases.  Subjective:  Patient is 52 year old Caucasian female who is here for scheduled visit. She is well-known to me from previous evaluations. She has chronic diarrhea felt to be secondary to IBS and elevated transaminases felt to be secondary to fatty liver. She was last seen in May 2013. Patient now presents with a recurrent right lower quadrant abdominal pain and diarrhea. On her worst day she has 8-10 stools per day. However since she has been Lomotil she is having few stools per day. Yesterday she only had one stool. She denies melena or rectal bleeding. She does complain of nausea but without vomiting. She states she had colonoscopy last year at [facility] and was found to have diverticulosis. She was admitted to [facility] last month for abdominal pain and was seen by Dr. Doristine Mango. His nodes mentions that she had 9 abdominal CTs in the last 2 years. He recommended referral to Dr. Lillie Columbia at [facility]. She was told to bring $600 as they do not do with insurance. Asian states she does not have discomfort [place] therefore canceled her appointment. She denies dysuria or hematuria. Her weight has been stable since her last visit of March 2013. She states heartburn is well controlled with therapy.  Current Medications: Current Outpatient Prescriptions  Medication Sig Dispense Refill  . acetaZOLAMIDE (DIAMOX) 250 MG tablet Take 250 mg by mouth daily.        Marland Kitchen aliskiren (TEKTURNA) 300 MG tablet Take 300 mg by mouth daily.        . diazepam (VALIUM) 2 MG tablet Take 2 mg by mouth as needed.        Marland Kitchen DIPHENOXYLATE-ATROPINE PO Take by mouth as needed.      . DULoxetine (CYMBALTA) 30 MG capsule Take 30 mg by mouth daily.        Marland Kitchen estrogens, conjugated, (PREMARIN) 0.9 MG tablet Take 0.9 mg by mouth daily.       . fluticasone (FLONASE) 50 MCG/ACT nasal spray Place 1 spray into the nose daily.        Marland Kitchen  glyBURIDE-metformin (GLUCOVANCE) 5-500 MG per tablet Take 1 tablet by mouth 2 (two) times daily.       . meclizine (ANTIVERT) 25 MG tablet Take 25 mg by mouth as needed.        . metoprolol (LOPRESSOR) 50 MG tablet Take 25 mg by mouth. Patient takes 1/2 tablet daily      . omeprazole (PRILOSEC) 40 MG capsule Take 40 mg by mouth daily.        . ondansetron (ZOFRAN-ODT) 8 MG disintegrating tablet 8 mg as needed.       Marland Kitchen VICTOZA 18 MG/3ML SOLN Inject 0.06 mg into the skin daily.       Marland Kitchen dicyclomine (BENTYL) 10 MG capsule Take 1 capsule (10 mg total) by mouth 2 (two) times daily before a meal.  60 capsule  1   No current facility-administered medications for this visit.   Past medical history; Past Medical History  Diagnosis Date  . Diabetes mellitus   . GERD (gastroesophageal reflux disease)   . Anxiety disorder   . Chronic low back pain   . Fatty liver   . Hypertension   . Diabetic neuropathy   . Irritable bowel syndrome   . Diverticulitis    Past Surgical History  Procedure Laterality Date  . Upper gastrointestinal endoscopy  2008  . Ectopic pregnancy surgery  1996  .  Bladder tact  2005  . Partial hysterectomy  2005  . Cholecystectomy  08/11  . [place]  2009    Preformed on the right shoulder  . Hernia repair  09/17/11  . Colonoscopy  2208  . Colonoscopy  In of September 2014    @ [facility]/Dr,.Benson     Objective: Blood pressure 118/90, pulse 76, temperature 97.4 F (36.3 C), temperature source Oral, resp. rate 18, height 5' 2"  (1.575 m), weight 170 lb 12.8 oz (77.474 kg). Patient is alert and in no acute distress. Conjunctiva is pink. Sclera is nonicteric Oropharyngeal mucosa is normal. No neck masses or thyromegaly noted. Cardiac exam with regular rhythm normal S1 and S2. No murmur or gallop noted. Lungs are clear to auscultation. Abdomen is symmetrical. Bowel sounds are normal. Abdomen is soft without hepatosplenomegaly. She has mild tenderness in the right lower  quadrant. No guarding or rebound noted. No LE edema or clubbing noted.  Labs/studies Results: AST 55 and 06/01/2013. ALT 27 on 06/01/2013.  Assessment:  #1. Diarrhea and RLQ abdominal pain appears to be secondary to irritable bowel syndrome. She was recently hospitalized at [facility] and had another negative CT. She did not see Dr. Donovan Kail as recommended by Dr. Doristine Mango because she could not afford to pay $600 up front. I believe he should be able to control her diarrhea with medication. #2. Mildly elevated transaminases secondary to NAFLD.    Plan:  Dicyclomine 10 mg by mouth twice a day. Lomotil 1 tablet by mouth twice a day. Stool diary until office visit in 8 weeks. Patient advised to use Dulcolax suppository or Fleet enema if she becomes constipated and should refrain from using OTC laxatives. Will request colonoscopy records from [facility] for review.

## 2013-09-24 ENCOUNTER — Other Ambulatory Visit (INDEPENDENT_AMBULATORY_CARE_PROVIDER_SITE_OTHER): Payer: Self-pay | Admitting: Internal Medicine

## 2013-10-25 ENCOUNTER — Encounter (INDEPENDENT_AMBULATORY_CARE_PROVIDER_SITE_OTHER): Payer: Self-pay | Admitting: Internal Medicine

## 2013-10-25 ENCOUNTER — Ambulatory Visit (INDEPENDENT_AMBULATORY_CARE_PROVIDER_SITE_OTHER): Payer: Medicare Other | Admitting: Internal Medicine

## 2013-10-25 VITALS — BP 118/76 | HR 72 | Temp 100.2°F | Resp 18 | Ht 62.0 in | Wt 174.5 lb

## 2013-10-25 DIAGNOSIS — K7689 Other specified diseases of liver: Secondary | ICD-10-CM

## 2013-10-25 DIAGNOSIS — K760 Fatty (change of) liver, not elsewhere classified: Secondary | ICD-10-CM

## 2013-10-25 DIAGNOSIS — I10 Essential (primary) hypertension: Secondary | ICD-10-CM | POA: Insufficient documentation

## 2013-10-25 DIAGNOSIS — K589 Irritable bowel syndrome without diarrhea: Secondary | ICD-10-CM

## 2013-10-25 MED ORDER — DICYCLOMINE HCL 10 MG PO CAPS: 20.0000 mg | ORAL_CAPSULE | Freq: Two times a day (BID) | ORAL | Status: DC

## 2013-10-25 NOTE — Patient Instructions (Signed)
Keep stool diary for another month. LFTs to be done in one month.

## 2013-10-25 NOTE — Progress Notes (Signed)
Presenting complaint;  Followup for diarrhea abdominal pain and elevated transaminases.  Subjective:  Patient is 53 year old Caucasian female who presents for scheduled visit. She was last seen on 08/15/2013 and asked to keep a stool diary. For the 37 days that she the record 4 she had 38 bowel movements and 25 of which were loose and 13 were soft to hard. On one day she had 6 loose stools and then she went 4 days without a bowel movement. She is having less pain and right low quadrant of her abdomen. She's not been able to determine which foods trigger her diarrhea. Her weight is up by 4 pounds since her last visit which he states is due eating rich food during holidays. She also has Corrected of days she's been walking on a treadmill. In one month she did 10 times. She is not having any side effects with dicyclomine.   Current Medications: Current Outpatient Prescriptions  Medication Sig Dispense Refill  . acetaZOLAMIDE (DIAMOX) 250 MG tablet Take 250 mg by mouth daily.        Marland Kitchen aliskiren (TEKTURNA) 300 MG tablet Take 300 mg by mouth daily.        . diazepam (VALIUM) 2 MG tablet Take 2 mg by mouth as needed.        . dicyclomine (BENTYL) 10 MG capsule Take 1 capsule (10 mg total) by mouth 2 (two) times daily before a meal.  60 capsule  5  . diphenoxylate-atropine (LOMOTIL) 2.5-0.025 MG per tablet TAKE ONE TABLET BY MOUTH TWICE DAILY  60 tablet  1  . DULoxetine (CYMBALTA) 30 MG capsule Take 60 mg by mouth daily.       Marland Kitchen estrogens, conjugated, (PREMARIN) 0.9 MG tablet Take 0.9 mg by mouth daily.       . fluticasone (FLONASE) 50 MCG/ACT nasal spray Place 1 spray into the nose daily.        Marland Kitchen glyBURIDE-metformin (GLUCOVANCE) 5-500 MG per tablet Take 1 tablet by mouth 2 (two) times daily.       . meclizine (ANTIVERT) 25 MG tablet Take 25 mg by mouth as needed.        . metoprolol (LOPRESSOR) 50 MG tablet Take 25 mg by mouth. Patient takes 1/2 tablet daily      . omeprazole (PRILOSEC) 40 MG  capsule Take 40 mg by mouth daily.        . ondansetron (ZOFRAN-ODT) 8 MG disintegrating tablet 8 mg as needed.       Marland Kitchen VICTOZA 18 MG/3ML SOLN Inject 0.06 mg into the skin daily.        No current facility-administered medications for this visit.     Objective: Blood pressure 118/76, pulse 72, temperature 100.2 F (37.9 C), temperature source Oral, resp. rate 18, height 5' 2"  (1.575 m), weight 174 lb 8 oz (79.153 kg). Patient is alert and in no acute distress. Conjunctiva is pink. Sclera is nonicteric Oropharyngeal mucosa is normal. No neck masses or thyromegaly noted. Cardiac exam with regular rhythm normal S1 and S2. No murmur or gallop noted. Lungs are clear to auscultation. Abdomen is full. Bowel sounds are normal. It is soft with mild tenderness in right lower quadrant but no guarding or rebound. No organomegaly or masses. No LE edema or clubbing noted.   Assessment:  #1. Irritable bowel syndrome. She is still having intermittent diarrhea and constipation. There is room for improvement. #2. Elevated transaminases secondary to fatty liver. It is reassuring to know that she is walking  on treadmill for 30 to45 minutes at least 3 times a week.  Plan:  Increase dicyclomine to 20 mg twice a day. Continue Lomotil at 1 by mouth twice a day. Fiber supplement 4 g by mouth each bedtime. LFTs in one month. Progress report in one month. Office visit in 6 months.

## 2013-11-03 ENCOUNTER — Other Ambulatory Visit (INDEPENDENT_AMBULATORY_CARE_PROVIDER_SITE_OTHER): Payer: Self-pay | Admitting: *Deleted

## 2013-11-03 ENCOUNTER — Encounter (INDEPENDENT_AMBULATORY_CARE_PROVIDER_SITE_OTHER): Payer: Self-pay | Admitting: *Deleted

## 2013-11-03 DIAGNOSIS — K760 Fatty (change of) liver, not elsewhere classified: Secondary | ICD-10-CM

## 2013-11-24 ENCOUNTER — Telehealth (INDEPENDENT_AMBULATORY_CARE_PROVIDER_SITE_OTHER): Payer: Self-pay | Admitting: *Deleted

## 2013-11-24 NOTE — Telephone Encounter (Signed)
Patient presented to office and states she went to [facility] ED on 2/15 and was admitted for severe diarrhea (IBS) with fever and epigastric pain, had lab work done and CT w/out dye --  it was determined she was having a severe IBS  flare - she is still having severe diarrhea and having to use adult pull ups, she is taking bentyl and lomotil (prescribed by you) which isn't helping, she wants to know what can be done, I did offer her an appointment with Terri tomorrow (2/20), patient refused appt and said she wanted to deal directly with you -- she can be reached at [phone]

## 2013-11-24 NOTE — Telephone Encounter (Signed)
Patient was called and given Dr.Rehman's recommendations. She was made aware that her office appointment with Dr.Rehman is 12-06-13 @ 11:45 pm. I also told her that we were going to get her records from her recent admission to [facility],(11/20/13). Patient may need to have Celiac Antibody Panel done, we are going to review her records to see if she has had one. She will be contacted if we need to get one.

## 2013-11-24 NOTE — Telephone Encounter (Signed)
Patient was just seen in the office one month ago and was having both diarrhea and constipation. Please tell patient not to take any laxatives by mouth; she can use suppository or enema she gets constipated. She can increase dicyclomine to 20 mg 3 times a day; She should keep stool diary for 2 weeks before office visit. Please review chart and if she's not had celiac antibody panel this needs to be done.

## 2013-11-25 ENCOUNTER — Telehealth (INDEPENDENT_AMBULATORY_CARE_PROVIDER_SITE_OTHER): Payer: Self-pay | Admitting: *Deleted

## 2013-11-25 DIAGNOSIS — K589 Irritable bowel syndrome without diarrhea: Secondary | ICD-10-CM

## 2013-11-25 DIAGNOSIS — R197 Diarrhea, unspecified: Secondary | ICD-10-CM

## 2013-11-25 DIAGNOSIS — K7689 Other specified diseases of liver: Secondary | ICD-10-CM

## 2013-11-25 LAB — HEPATIC FUNCTION PANEL
ALT: 33 U/L (ref 0–35)
AST: 91 U/L — ABNORMAL HIGH (ref 0–37)
Albumin: 3.4 g/dL — ABNORMAL LOW (ref 3.5–5.2)
Alkaline Phosphatase: 47 U/L (ref 39–117)
BILIRUBIN DIRECT: 0.1 mg/dL (ref 0.0–0.3)
Indirect Bilirubin: 0.1 mg/dL — ABNORMAL LOW (ref 0.2–1.2)
TOTAL PROTEIN: 6.7 g/dL (ref 6.0–8.3)
Total Bilirubin: 0.2 mg/dL (ref 0.2–1.2)

## 2013-11-25 NOTE — Telephone Encounter (Signed)
Per Dr.Rehman the patient will need to have labs drawn. Patient made aware.

## 2013-11-29 LAB — RETICULIN ANTIBODIES, IGA W TITER: Reticulin Ab, IgA: NEGATIVE

## 2013-11-30 LAB — GLIADIN ANTIBODIES, SERUM
GLIADIN IGA: 12.3 U/mL (ref <20)
Gliadin IgG: 5.8 U/mL (ref <20)

## 2013-11-30 LAB — TISSUE TRANSGLUTAMINASE, IGA: Tissue Transglutaminase Ab, IgA: 5.5 U/mL (ref <20)

## 2013-12-06 ENCOUNTER — Ambulatory Visit (INDEPENDENT_AMBULATORY_CARE_PROVIDER_SITE_OTHER): Payer: Medicaid Other | Admitting: Internal Medicine

## 2013-12-06 ENCOUNTER — Encounter (INDEPENDENT_AMBULATORY_CARE_PROVIDER_SITE_OTHER): Payer: Self-pay | Admitting: Internal Medicine

## 2013-12-06 VITALS — BP 110/70 | HR 74 | Temp 97.4°F | Resp 18 | Ht 62.0 in | Wt 168.8 lb

## 2013-12-06 DIAGNOSIS — K760 Fatty (change of) liver, not elsewhere classified: Secondary | ICD-10-CM

## 2013-12-06 DIAGNOSIS — K7689 Other specified diseases of liver: Secondary | ICD-10-CM

## 2013-12-06 DIAGNOSIS — K589 Irritable bowel syndrome without diarrhea: Secondary | ICD-10-CM

## 2013-12-06 DIAGNOSIS — R252 Cramp and spasm: Secondary | ICD-10-CM

## 2013-12-06 MED ORDER — MAGNESIUM 100 MG PO TABS: 1.0000 | ORAL_TABLET | Freq: Every day | ORAL | Status: DC

## 2013-12-06 MED ORDER — GLYCERIN (LAXATIVE) 2 G RE SUPP: 1.0000 | Freq: Once | RECTAL | Status: DC | PRN

## 2013-12-06 NOTE — Patient Instructions (Addendum)
Use glycerin suppository after breakfast daily as needed.  Can double up on magnesium tablet if muscle cramps persist. Remember to take fiber supplement 2 g by mouth twice daily.  Yogurt with probiotic one cup daily LFTs in 3 months.

## 2013-12-06 NOTE — Progress Notes (Signed)
Presenting complaint;  Followup for irritable bowel syndrome and elevated transaminases.  Subjective  Patient is 53 year old Caucasian female who has history of IBS and elevated transaminases secondary to fatty liver. She was seen in the office on 10/25/2013 and appeared to be doing better. Patient called office 3 weeks ago stating that she had an episode of severe diarrhea with fever and abdominal pain. She was seen in emergency room at [facility] on 11/20/2013. Abdominal pelvic CT was unremarkable. She was advised to followup with [place]. Patient was advised to increase dicyclomine to 20 mg 2 or 3 times a day. Celiac antibody panel was checked and was negative. She has Stools artery as recommended. She had 31 stools and 26 days. She has had anywhere from 0-4 stools per day. Stool consistency varies from loose to hard stool to She has cramping lower abdominal pain which usually precedes an episode of diarrhea. She has had urgency but no accidents. She denies melena or rectal bleeding. She has lost 6 pounds since her last visit. She says she is walking or treadmill at least 3-4 times a week and each time she does 30 minutes. She also complains abdominal wall cramping when she turns in certain positions.   Current Medications: Current Outpatient Prescriptions  Medication Sig Dispense Refill  . acetaZOLAMIDE (DIAMOX) 250 MG tablet Take 250 mg by mouth daily.        Marland Kitchen aliskiren (TEKTURNA) 300 MG tablet Take 300 mg by mouth daily.        . diazepam (VALIUM) 2 MG tablet Take 2 mg by mouth as needed.        . dicyclomine (BENTYL) 10 MG capsule Take 2 capsules (20 mg total) by mouth 2 (two) times daily before a meal.  120 capsule  5  . diphenoxylate-atropine (LOMOTIL) 2.5-0.025 MG per tablet TAKE ONE TABLET BY MOUTH TWICE DAILY  60 tablet  1  . DULoxetine (CYMBALTA) 30 MG capsule Take 60 mg by mouth daily.       Marland Kitchen estrogens, conjugated, (PREMARIN) 0.9 MG tablet Take 0.9 mg by mouth daily.       Marland Kitchen FIBER FORMULA PO  Take by mouth. Patient takes 1 tablet daily. [place] Brand      . fluticasone (FLONASE) 50 MCG/ACT nasal spray Place 1 spray into the nose daily.        Marland Kitchen glyBURIDE-metformin (GLUCOVANCE) 5-500 MG per tablet Take 1 tablet by mouth 2 (two) times daily.       . meclizine (ANTIVERT) 25 MG tablet Take 25 mg by mouth as needed.        . metoprolol (LOPRESSOR) 50 MG tablet Take 25 mg by mouth. Patient takes 1/2 tablet daily      . omeprazole (PRILOSEC) 40 MG capsule Take 40 mg by mouth daily.        . ondansetron (ZOFRAN-ODT) 8 MG disintegrating tablet 8 mg as needed.       Marland Kitchen VICTOZA 18 MG/3ML SOLN Inject 0.06 mg into the skin daily.        No current facility-administered medications for this visit.     Objective: Blood pressure 110/70, pulse 74, temperature 97.4 F (36.3 C), temperature source Oral, resp. rate 18, height 5' 2"  (1.575 m), weight 168 lb 12.8 oz (76.567 kg). Patient is alert and in no acute distress. Conjunctiva is pink. Sclera is nonicteric Oropharyngeal mucosa is normal. No neck masses or thyromegaly noted. Cardiac exam with regular rhythm normal S1 and S2. No murmur or gallop  noted. Lungs are clear to auscultation. Abdomen. Bowel sounds are normal. On palpation abdomen is soft and nontender without organomegaly or masses. No LE edema or clubbing noted.  Labs/studies Results: Celiac antibody panel on 11/28/2013 negative. LFTs from 11/24/2013. Bilirubin 0.2, AB 47, AST 91, ALT 33, protein 6.7 albumin 3.4    Assessment:  #1. Irritable bowel syndrome. Stool diary reveals an average of 1.2 bowel movements per day but she is all over the place. She is having diarrhea normal stools as well as hard stools. #2. Mildly elevated transaminases secondary to fatty liver. Reassuring to know that she is walking on treadmill at least 3 times a week.   Plan:  Increase fiber supplement to 2 g by mouth twice a day. Yogurt with probiotic one cup daily. Magnesium 100 mg by mouth  daily. Glycerin suppository PR daily when necessary. Office visit in 3 months.

## 2013-12-10 ENCOUNTER — Other Ambulatory Visit (INDEPENDENT_AMBULATORY_CARE_PROVIDER_SITE_OTHER): Payer: Self-pay | Admitting: Internal Medicine

## 2014-01-04 ENCOUNTER — Other Ambulatory Visit (INDEPENDENT_AMBULATORY_CARE_PROVIDER_SITE_OTHER): Payer: Self-pay | Admitting: Internal Medicine

## 2014-01-04 ENCOUNTER — Telehealth (INDEPENDENT_AMBULATORY_CARE_PROVIDER_SITE_OTHER): Payer: Self-pay | Admitting: *Deleted

## 2014-01-04 DIAGNOSIS — R197 Diarrhea, unspecified: Secondary | ICD-10-CM

## 2014-01-04 DIAGNOSIS — R109 Unspecified abdominal pain: Secondary | ICD-10-CM

## 2014-01-04 DIAGNOSIS — R112 Nausea with vomiting, unspecified: Secondary | ICD-10-CM

## 2014-01-04 NOTE — Telephone Encounter (Signed)
Please schedule patient for upper GI with small bowel follow-through and then office visit. Reason is nausea vomiting diarrhea and abdominal pain Patient had EGD with duodenal biopsy in 2008 and was negative for celiac disease

## 2014-01-04 NOTE — Telephone Encounter (Signed)
UGI w/ SBFT sch'd 01/05/14 @ 10:00 (945), left message for patient

## 2014-01-04 NOTE — Telephone Encounter (Signed)
Patient states that she was seen @ [facility] last night. Very sick,blood sugar was 54. Vomiting every 15 minute and had lower right abdominal pain. She was told that her WBC was elevated and her Liver Enzymes were also. She did not want to have CAT Scan. They gave her IV's and Zofran. Still not feeling well today. She is to see her Development worker, international aid as soon as possible. I am getting records from [facility].

## 2014-01-04 NOTE — Telephone Encounter (Signed)
Per Tammy, hold on to message. Dr. Laural Golden is going to arrange test to be done before an apt.

## 2014-01-05 ENCOUNTER — Ambulatory Visit (HOSPITAL_COMMUNITY)
Admission: RE | Admit: 2014-01-05 | Discharge: 2014-01-05 | Disposition: A | Discharge status: Home / Self Care | Payer: Medicaid Other | Source: Ambulatory Visit | Attending: Internal Medicine | Admitting: Internal Medicine

## 2014-01-05 ENCOUNTER — Telehealth (INDEPENDENT_AMBULATORY_CARE_PROVIDER_SITE_OTHER): Payer: Self-pay | Admitting: *Deleted

## 2014-01-05 DIAGNOSIS — R109 Unspecified abdominal pain: Secondary | ICD-10-CM

## 2014-01-05 DIAGNOSIS — R112 Nausea with vomiting, unspecified: Secondary | ICD-10-CM

## 2014-01-05 DIAGNOSIS — R197 Diarrhea, unspecified: Secondary | ICD-10-CM

## 2014-01-05 NOTE — Telephone Encounter (Signed)
[name] came by the office after leaving the radiology apt at [facility]. We know radiology has made you aware she is on the motility drug and could not do the UGI/SBFT today. Patient came in angry demanded someone to return her call on why she is having the UGI w/ SBFT verses a CT. Please note patient has had 2 CT's, one on 07/13/13 then one on 11/22/13 at [facility]. Copies available at office with Lelon Frohlich. Also patient brought labs done on 01/03/14  At [facility]. On your desk.

## 2014-01-09 NOTE — Telephone Encounter (Signed)
Patient's call returned. She is having this study in order to find out a recurrent source of her nausea vomiting abdominal pain and diarrhea. She is scheduled for small bowel follow-through in a.m.

## 2014-01-10 ENCOUNTER — Ambulatory Visit (HOSPITAL_COMMUNITY)
Admission: RE | Admit: 2014-01-10 | Discharge: 2014-01-10 | Disposition: A | Discharge status: Home / Self Care | Payer: Medicaid Other | Source: Ambulatory Visit | Attending: Internal Medicine | Admitting: Internal Medicine

## 2014-01-10 DIAGNOSIS — I10 Essential (primary) hypertension: Secondary | ICD-10-CM | POA: Insufficient documentation

## 2014-01-10 DIAGNOSIS — R197 Diarrhea, unspecified: Secondary | ICD-10-CM | POA: Insufficient documentation

## 2014-01-10 DIAGNOSIS — K589 Irritable bowel syndrome without diarrhea: Secondary | ICD-10-CM | POA: Insufficient documentation

## 2014-01-10 DIAGNOSIS — R109 Unspecified abdominal pain: Secondary | ICD-10-CM | POA: Insufficient documentation

## 2014-01-10 DIAGNOSIS — E119 Type 2 diabetes mellitus without complications: Secondary | ICD-10-CM | POA: Insufficient documentation

## 2014-01-10 DIAGNOSIS — K219 Gastro-esophageal reflux disease without esophagitis: Secondary | ICD-10-CM | POA: Insufficient documentation

## 2014-01-10 DIAGNOSIS — R112 Nausea with vomiting, unspecified: Secondary | ICD-10-CM | POA: Insufficient documentation

## 2014-01-11 NOTE — Progress Notes (Signed)
Apt has been scheduled for 01/12/14 with Dr. Laural Golden

## 2014-01-12 ENCOUNTER — Ambulatory Visit (INDEPENDENT_AMBULATORY_CARE_PROVIDER_SITE_OTHER): Payer: Medicaid Other | Admitting: Internal Medicine

## 2014-01-16 ENCOUNTER — Encounter (INDEPENDENT_AMBULATORY_CARE_PROVIDER_SITE_OTHER): Payer: Self-pay | Admitting: *Deleted

## 2014-01-16 ENCOUNTER — Other Ambulatory Visit (INDEPENDENT_AMBULATORY_CARE_PROVIDER_SITE_OTHER): Payer: Self-pay | Admitting: *Deleted

## 2014-01-16 ENCOUNTER — Encounter (INDEPENDENT_AMBULATORY_CARE_PROVIDER_SITE_OTHER): Payer: Self-pay | Admitting: Internal Medicine

## 2014-01-16 ENCOUNTER — Ambulatory Visit (INDEPENDENT_AMBULATORY_CARE_PROVIDER_SITE_OTHER): Payer: Medicaid Other | Admitting: Internal Medicine

## 2014-01-16 VITALS — BP 110/80 | HR 76 | Temp 97.5°F | Resp 18 | Ht 62.0 in | Wt 171.9 lb

## 2014-01-16 DIAGNOSIS — R112 Nausea with vomiting, unspecified: Secondary | ICD-10-CM

## 2014-01-16 DIAGNOSIS — K760 Fatty (change of) liver, not elsewhere classified: Secondary | ICD-10-CM

## 2014-01-16 DIAGNOSIS — R1031 Right lower quadrant pain: Secondary | ICD-10-CM

## 2014-01-16 DIAGNOSIS — R109 Unspecified abdominal pain: Secondary | ICD-10-CM

## 2014-01-16 DIAGNOSIS — K7689 Other specified diseases of liver: Secondary | ICD-10-CM

## 2014-01-16 NOTE — Patient Instructions (Signed)
Keep symptom diary. Smart pill study to be scheduled.

## 2014-01-16 NOTE — Progress Notes (Signed)
Presenting complaint;  Nausea vomiting abdominal pain and diarrhea.  Subjective:  Patient is 53 year old Caucasian female who was last seen on 12/06/2013 following trip to the emergency room middle of February 2015. She had another episode of nausea vomiting and abdominal pain and was seen in emergency room on 01/03/2014 and advised to followup with [place]. She was treated and discharged. Lab studies are pertinent for AST of 100 and ALT of 39. Serum lipase was mildly elevated at 54(22-55). She also had mild leukocytosis with WBC of 13.2. Urinalysis and acute abdominal series are unremarkable. She was scheduled for small bowel follow-through prior to this visit. She recalls that recent illness began after eating pizza with mid abdominal pain which radiated bilaterally and was followed by excessive belching and passing flatus and the next day she developed vomiting and she vomited food and fluid but no blood. Now she is having pain in right lower quadrant of her abdomen where she is at pain off and on for several months. She has been every day but is more pronounced at times and described to be throbbing. She has been nauseated at times but hasn't had any more episodes of vomiting since visit to the emergency room. She does not feel that dicyclomine for Lomotil has helped. However she has not had diarrhea since she has been on fiber supplement. She is having one to 2 formed stools daily but she does not have sense of complete evacuation. She denies melena or rectal bleeding. She had colonoscopy on 06/14/2012 by Dr. Sherrlyn Hock revealing sigmoid colon diverticulosis and a small polyp. She states she is walking regularly. She has been on probiotic for one month but cannot tell any difference. She is taking Cymbalta for peripheral neuropathy.  Current Medications: Outpatient Encounter Prescriptions as of 01/16/2014  Medication Sig  . acetaZOLAMIDE (DIAMOX) 250 MG tablet Take 250 mg by mouth daily.    Marland Kitchen  aliskiren (TEKTURNA) 300 MG tablet Take 300 mg by mouth daily.    . diazepam (VALIUM) 2 MG tablet Take 2 mg by mouth as needed.    . dicyclomine (BENTYL) 10 MG capsule Take 2 capsules (20 mg total) by mouth 2 (two) times daily before a meal.  . diphenoxylate-atropine (LOMOTIL) 2.5-0.025 MG per tablet TAKE ONE TABLET BY MOUTH TWICE DAILY  . DULoxetine (CYMBALTA) 30 MG capsule Take 60 mg by mouth daily.   Marland Kitchen estrogens, conjugated, (PREMARIN) 0.9 MG tablet Take 0.9 mg by mouth daily.   Marland Kitchen FIBER FORMULA PO Take 2 tablets by mouth 2 (two) times daily. Patient takes 1 tablet daily. [place] Brand  . fluticasone (FLONASE) 50 MCG/ACT nasal spray Place 1 spray into the nose daily.    Marland Kitchen glyBURIDE-metformin (GLUCOVANCE) 5-500 MG per tablet Take 1 tablet by mouth 2 (two) times daily.   Marland Kitchen glycerin adult (GLYCERIN ADULT) 2 G SUPP Place 1 suppository rectally once as needed for moderate constipation.  . Magnesium 100 MG TABS Take 1 tablet (100 mg total) by mouth daily.  . meclizine (ANTIVERT) 25 MG tablet Take 25 mg by mouth as needed.    . metoprolol (LOPRESSOR) 50 MG tablet Take 25 mg by mouth. Patient takes 1/2 tablet daily  . omeprazole (PRILOSEC) 40 MG capsule Take 40 mg by mouth daily.    . ondansetron (ZOFRAN-ODT) 8 MG disintegrating tablet 8 mg as needed.   Marland Kitchen VICTOZA 18 MG/3ML SOLN Inject 0.06 mg into the skin daily.      Objective: Blood pressure 110/80, pulse 76, temperature  97.5 F (36.4 C), temperature source Oral, resp. rate 18, height 5' 2"  (1.575 m), weight 171 lb 14.4 oz (77.973 kg). The patient is alert and in no acute distress. Conjunctiva is pink. Sclera is nonicteric Oropharyngeal mucosa is normal. No neck masses or thyromegaly noted. Cardiac exam with regular rhythm normal S1 and S2. No murmur or gallop noted. Lungs are clear to auscultation. Abdomen symmetrical. Bowel sounds are normal. No bruits noted. Abdomen is soft with mild tenderness in the epigastrium and right lower quadrant  but no organomegaly or masses noted.  No LE edema or clubbing noted.  Labs/studies Results: Lab data as above. Small bowel follow-through was performed for a 2015 Revealing food debris in the stomach slow transit of contrast through small bowel and right colonic wall thickening possibly due to poor distention.  Assessment:  #1. Recurrent spells of nausea and vomiting. She may have an element of gastroparesis he is given that she had food debris in her stomach even though she been fasting for 12 months prior to the study. She was noted to have mildly elevated serum lipase which would appear to be nonspecific. Quick recovery would argue up just her having pancreatitis. #2. Right lower quadrant abdominal pain felt to be secondary to irritable bowel syndrome. She had negative colonoscopy last year as well as negative CT in August and October 2014 and February 2015. Unfortunate she has not responded to antispasmodic therapy. #3. Diarrhea has improved with fiber supplement. #4. Elevated transaminases felt to be secondary to NAFLD. Recent bump in AST to 100 most likely secondary from  nonhepatic source. Multiple imaging studies have been negative for dilated bile duct.   Plan:  Discontinue dicyclomine and Lomotil as sees medications are not helping according to patient. Smart pill study to assess gastric and small bowel motility. Patient will keep symptom diary. Office visit in 3 months.

## 2014-01-17 ENCOUNTER — Encounter (HOSPITAL_COMMUNITY): Payer: Self-pay | Admitting: Pharmacy Technician

## 2014-01-17 DIAGNOSIS — R112 Nausea with vomiting, unspecified: Secondary | ICD-10-CM | POA: Insufficient documentation

## 2014-01-23 ENCOUNTER — Ambulatory Visit (HOSPITAL_COMMUNITY)
Admission: RE | Admit: 2014-01-23 | Discharge: 2014-01-23 | Disposition: A | Discharge status: Home / Self Care | Payer: Medicaid Other | Source: Ambulatory Visit | Attending: Internal Medicine | Admitting: Internal Medicine

## 2014-01-23 ENCOUNTER — Encounter (HOSPITAL_COMMUNITY)
Admission: RE | Disposition: A | Discharge status: Home / Self Care | Payer: Self-pay | Source: Ambulatory Visit | Attending: Internal Medicine

## 2014-01-23 DIAGNOSIS — F411 Generalized anxiety disorder: Secondary | ICD-10-CM | POA: Insufficient documentation

## 2014-01-23 DIAGNOSIS — E119 Type 2 diabetes mellitus without complications: Secondary | ICD-10-CM | POA: Insufficient documentation

## 2014-01-23 DIAGNOSIS — R112 Nausea with vomiting, unspecified: Secondary | ICD-10-CM | POA: Insufficient documentation

## 2014-01-23 DIAGNOSIS — R1031 Right lower quadrant pain: Secondary | ICD-10-CM | POA: Insufficient documentation

## 2014-01-23 DIAGNOSIS — I10 Essential (primary) hypertension: Secondary | ICD-10-CM | POA: Insufficient documentation

## 2014-01-23 DIAGNOSIS — Z79899 Other long term (current) drug therapy: Secondary | ICD-10-CM | POA: Insufficient documentation

## 2014-01-23 SURGERY — CAPSULE ENDOSCOPY, USING SMARTPILL MOTILITY TESTING SYSTEM

## 2014-01-23 NOTE — H&P (Signed)
[name] is an 53 y.o. female.   Chief Complaint: Patient is here to undergo smock spill study. HPI: Patient is 53 year old Caucasian female with chronic GERD maintenance PPI who has recurrent spells of nausea and vomiting. 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 She has not responded well to therapy. Patient's last EGD was in October 2000 and  it was normal other than small sliding hiatal hernia. She recently had upper GI series and noted to have some food debris in the stomach. 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 She is therefore undergoing this study.  Past Medical History  Diagnosis Date  . Diabetes mellitus   . GERD (gastroesophageal reflux disease)   . Anxiety disorder   . Chronic low back pain   . Fatty liver   . Hypertension   . Diabetic neuropathy   . Irritable bowel syndrome   . Diverticulitis     Past Surgical History  Procedure Laterality Date  . Upper gastrointestinal endoscopy  2008  . Ectopic pregnancy surgery  1996  . Bladder tact  2005  . Partial hysterectomy  2005  . Cholecystectomy  08/11  . [place]  2009    Preformed on the right shoulder  . Hernia repair  09/17/11  . Colonoscopy  2208  . Colonoscopy  In of September 2014    @ [facility]/Dr,.[name]    Family History  Problem Relation Age of Onset  . Healthy Mother   . Heart disease Father   . Healthy Sister   . Esophageal cancer Brother   . Healthy Son   . Healthy Son    Social History:  reports that she has never smoked. She has never used smokeless tobacco. She reports that she does not drink alcohol or use illicit drugs.  Allergies:  Allergies  Allergen Reactions  . Flagyl [Metronidazole Hcl] Itching    Medications Prior to Admission  Medication Sig Dispense Refill  . acetaZOLAMIDE (DIAMOX) 250 MG tablet Take 250 mg by mouth daily.        Marland Kitchen aliskiren (TEKTURNA) 300 MG tablet Take 300 mg by mouth  daily.        . diazepam (VALIUM) 2 MG tablet Take 2 mg by mouth at bedtime.       . DULoxetine (CYMBALTA) 60 MG capsule Take 60 mg by mouth daily.      Marland Kitchen estrogens, conjugated, (PREMARIN) 0.9 MG tablet Take 0.9 mg by mouth daily.       Marland Kitchen FIBER FORMULA PO Take 2 tablets by mouth 2 (two) times daily.       . fluticasone (FLONASE) 50 MCG/ACT nasal spray Place 2 sprays into both nostrils daily.       Marland Kitchen glyBURIDE-metformin (GLUCOVANCE) 5-500 MG per tablet Take 1 tablet by mouth 2 (two) times daily.       . metoprolol (LOPRESSOR) 50 MG tablet Take 25 mg by mouth. Patient takes 1/2 tablet daily      . VICTOZA 18 MG/3ML SOLN Inject 0.06 mg into the skin daily.       Marland Kitchen glycerin adult (GLYCERIN ADULT) 2 G SUPP Place 1 suppository rectally once as needed for moderate constipation.    0  . meclizine (ANTIVERT) 25 MG tablet Take 25 mg by mouth daily as needed for dizziness.       Marland Kitchen omeprazole (PRILOSEC) 40 MG capsule Take 40 mg by mouth daily.        Marland Kitchen  ondansetron (ZOFRAN-ODT) 8 MG disintegrating tablet 8 mg as needed.         No results found for this or any previous visit (from the past 48 hour(s)). No results found.  ROS  Height 5' 2"  (1.575 m), weight 167 lb (75.751 kg). Physical Exam   Assessment/Plan Patient with recurrent spells of nausea and vomiting. He is diabetic and suspected to have gastroparesis. She is therefore undergoing smock spill study.  Najeeb U Rehman 01/23/2014, 5:10 PM

## 2014-01-23 NOTE — OR Nursing (Signed)
Patient in for Smart Pill. States that she started having painful swallowing yesterday. Patient has been off of her Prilosec for 1 week. Dr. Laural Golden notified and said patient could resume Prilosec today with her evening meal. Patient informed and verbalized understanding. Dr. Laural Golden also notified of patient having a history of diverticulitis and said ok to proceed with Smart Pill procedure.

## 2014-02-02 ENCOUNTER — Telehealth (INDEPENDENT_AMBULATORY_CARE_PROVIDER_SITE_OTHER): Payer: Self-pay | Admitting: *Deleted

## 2014-02-02 NOTE — Telephone Encounter (Signed)
Patient called and states that she just wanted to be sure that if she is sent for a second opinion that the workup will be different than what Dr.Rehman has already done. She also wants to know since Dr.Rehman made the change in her medication (Diazepam 2 mg) , will he write her another prescription. Dr.Rehman wants her to take it twice a day. This will be addressed with Dr.Rehman.

## 2014-02-04 NOTE — Op Note (Signed)
  Procedure;   GI motility study with smart pill.  Date of procedure; 01/23/2014.  Indication; patient is 53 year old Caucasian female with recurrent spells of nausea vomiting and abdominal pain. She underwent upper GI series with small bowel follow-through. She was noted to have food debris in her stomach and therefore suspected to have gastroparesis. Procedure risks were reviewed with the patient and informed consent was obtained.  Findings;  Gastric emptying time(GET)             1 hour and 14 minutes            (normal 4:10)  Small bowel transit time;                    3 hours and 13 minutes         (NA)  Colonic transit time;                            49 hours.                                (NA)  Small and large bowel transit time;      52 hours and 14 minutes       (49:10)  Whole gut transit time;                          53 hours and 29 minutes       (53:20).   Assessment;  Gastric emptying time is well within normal limits. Small and large bowel transit time is slightly prolonged but no significance.  Recommendations; Will try patient on IBgard before referral to tertiary center for further evaluation.

## 2014-02-09 ENCOUNTER — Other Ambulatory Visit (INDEPENDENT_AMBULATORY_CARE_PROVIDER_SITE_OTHER): Payer: Self-pay | Admitting: *Deleted

## 2014-02-09 MED ORDER — DIAZEPAM 2 MG PO TABS: 2.0000 mg | ORAL_TABLET | Freq: Two times a day (BID) | ORAL | Status: DC

## 2014-02-09 NOTE — Telephone Encounter (Signed)
Patient has been switched to two daily

## 2014-02-10 NOTE — Telephone Encounter (Signed)
This has been addressed. Patient is to come and get prescription.

## 2014-02-13 ENCOUNTER — Encounter (INDEPENDENT_AMBULATORY_CARE_PROVIDER_SITE_OTHER): Payer: Self-pay

## 2014-03-15 ENCOUNTER — Other Ambulatory Visit (INDEPENDENT_AMBULATORY_CARE_PROVIDER_SITE_OTHER): Payer: Self-pay | Admitting: *Deleted

## 2014-03-15 DIAGNOSIS — K760 Fatty (change of) liver, not elsewhere classified: Secondary | ICD-10-CM

## 2014-03-15 LAB — HEPATIC FUNCTION PANEL
ALK PHOS: 56 U/L (ref 39–117)
ALT: 25 U/L (ref 0–35)
AST: 69 U/L — ABNORMAL HIGH (ref 0–37)
Albumin: 3.6 g/dL (ref 3.5–5.2)
Bilirubin, Direct: 0.1 mg/dL (ref 0.0–0.3)
Indirect Bilirubin: 0.1 mg/dL — ABNORMAL LOW (ref 0.2–1.2)
TOTAL PROTEIN: 7.3 g/dL (ref 6.0–8.3)
Total Bilirubin: 0.2 mg/dL (ref 0.2–1.2)

## 2014-03-18 ENCOUNTER — Emergency Department (HOSPITAL_COMMUNITY)
Admission: EM | Admit: 2014-03-18 | Discharge: 2014-03-19 | Disposition: A | Discharge status: Home / Self Care | Payer: Medicaid Other | Attending: Emergency Medicine | Admitting: Emergency Medicine

## 2014-03-18 ENCOUNTER — Encounter (HOSPITAL_COMMUNITY): Payer: Self-pay | Admitting: Emergency Medicine

## 2014-03-18 DIAGNOSIS — F411 Generalized anxiety disorder: Secondary | ICD-10-CM | POA: Insufficient documentation

## 2014-03-18 DIAGNOSIS — IMO0002 Reserved for concepts with insufficient information to code with codable children: Secondary | ICD-10-CM | POA: Insufficient documentation

## 2014-03-18 DIAGNOSIS — R109 Unspecified abdominal pain: Secondary | ICD-10-CM

## 2014-03-18 DIAGNOSIS — Z79899 Other long term (current) drug therapy: Secondary | ICD-10-CM | POA: Insufficient documentation

## 2014-03-18 DIAGNOSIS — R110 Nausea: Secondary | ICD-10-CM | POA: Insufficient documentation

## 2014-03-18 DIAGNOSIS — K921 Melena: Secondary | ICD-10-CM | POA: Insufficient documentation

## 2014-03-18 DIAGNOSIS — Z90710 Acquired absence of both cervix and uterus: Secondary | ICD-10-CM | POA: Insufficient documentation

## 2014-03-18 DIAGNOSIS — E1142 Type 2 diabetes mellitus with diabetic polyneuropathy: Secondary | ICD-10-CM | POA: Insufficient documentation

## 2014-03-18 DIAGNOSIS — Z9889 Other specified postprocedural states: Secondary | ICD-10-CM | POA: Insufficient documentation

## 2014-03-18 DIAGNOSIS — K219 Gastro-esophageal reflux disease without esophagitis: Secondary | ICD-10-CM | POA: Insufficient documentation

## 2014-03-18 DIAGNOSIS — Z9089 Acquired absence of other organs: Secondary | ICD-10-CM | POA: Insufficient documentation

## 2014-03-18 DIAGNOSIS — E1149 Type 2 diabetes mellitus with other diabetic neurological complication: Secondary | ICD-10-CM | POA: Insufficient documentation

## 2014-03-18 DIAGNOSIS — K589 Irritable bowel syndrome without diarrhea: Secondary | ICD-10-CM | POA: Insufficient documentation

## 2014-03-18 DIAGNOSIS — G8929 Other chronic pain: Secondary | ICD-10-CM | POA: Insufficient documentation

## 2014-03-18 DIAGNOSIS — I10 Essential (primary) hypertension: Secondary | ICD-10-CM | POA: Insufficient documentation

## 2014-03-18 LAB — COMPREHENSIVE METABOLIC PANEL
ALBUMIN: 3 g/dL — AB (ref 3.5–5.2)
ALT: 35 U/L (ref 0–35)
AST: 98 U/L — ABNORMAL HIGH (ref 0–37)
Alkaline Phosphatase: 63 U/L (ref 39–117)
BILIRUBIN TOTAL: 0.1 mg/dL — AB (ref 0.3–1.2)
BUN: 15 mg/dL (ref 6–23)
CHLORIDE: 101 meq/L (ref 96–112)
CO2: 22 mEq/L (ref 19–32)
Calcium: 8.6 mg/dL (ref 8.4–10.5)
Creatinine, Ser: 0.71 mg/dL (ref 0.50–1.10)
GFR calc non Af Amer: >90 mL/min (ref >90)
GLUCOSE: 261 mg/dL — AB (ref 70–99)
POTASSIUM: 4 meq/L (ref 3.7–5.3)
Sodium: 137 mEq/L (ref 137–147)
TOTAL PROTEIN: 7.8 g/dL (ref 6.0–8.3)

## 2014-03-18 LAB — CBC WITH DIFFERENTIAL/PLATELET
Basophils Absolute: 0 10*3/uL (ref 0.0–0.1)
Basophils Relative: 0 % (ref 0–1)
Eosinophils Absolute: 0.2 10*3/uL (ref 0.0–0.7)
Eosinophils Relative: 2 % (ref 0–5)
HCT: 35.2 % — ABNORMAL LOW (ref 36.0–46.0)
Hemoglobin: 11.5 g/dL — ABNORMAL LOW (ref 12.0–15.0)
LYMPHS ABS: 3.9 10*3/uL (ref 0.7–4.0)
LYMPHS PCT: 37 % (ref 12–46)
MCH: 28.5 pg (ref 26.0–34.0)
MCHC: 32.7 g/dL (ref 30.0–36.0)
MCV: 87.1 fL (ref 78.0–100.0)
Monocytes Absolute: 0.6 10*3/uL (ref 0.1–1.0)
Monocytes Relative: 6 % (ref 3–12)
Neutro Abs: 6 10*3/uL (ref 1.7–7.7)
Neutrophils Relative %: 55 % (ref 43–77)
Platelets: 222 10*3/uL (ref 150–400)
RBC: 4.04 MIL/uL (ref 3.87–5.11)
RDW: 14.5 % (ref 11.5–15.5)
WBC: 10.7 10*3/uL — AB (ref 4.0–10.5)

## 2014-03-18 LAB — TYPE AND SCREEN
ABO/RH(D): A NEG
Antibody Screen: NEGATIVE

## 2014-03-18 MED ORDER — ONDANSETRON HCL 4 MG/2ML IJ SOLN
4.0000 mg | Freq: Once | INTRAMUSCULAR | Status: AC
  Administered 2014-03-18: 4 mg via INTRAVENOUS

## 2014-03-18 MED ORDER — SODIUM CHLORIDE 0.9 % IV SOLN
INTRAVENOUS | Status: DC
  Administered 2014-03-18: 125 mL/h via INTRAVENOUS

## 2014-03-18 MED ORDER — ONDANSETRON HCL 4 MG/2ML IJ SOLN
4.0000 mg | Freq: Once | INTRAMUSCULAR | Status: DC
  Filled 2014-03-18: qty 2

## 2014-03-18 MED ORDER — HYDROMORPHONE HCL PF 1 MG/ML IJ SOLN
1.0000 mg | Freq: Once | INTRAMUSCULAR | Status: AC
  Administered 2014-03-18: 1 mg via INTRAVENOUS
  Filled 2014-03-18: qty 1

## 2014-03-18 NOTE — ED Provider Notes (Signed)
Patient seen/examined in the Emergency Department in conjunction with Midlevel Provider [facility] Patient reports abdominal pain and rectal bleeding, last episode over 3 hrs ago Exam : awake/alert, no distress Plan: labs pending, but she is stable at this time, and may be amenable for close GI followup in 2 days   Sharyon Cable, MD 03/18/14 2352

## 2014-03-18 NOTE — ED Provider Notes (Signed)
CSN: [id number]     Arrival date & time 03/18/14  2135 History   First MD Initiated Contact with Patient 03/18/14 2221     Chief Complaint  Patient presents with  . Abdominal Pain  . Melena     (Consider location/radiation/quality/duration/timing/severity/associated sxs/prior Treatment) Patient is a 53 y.o. female presenting with abdominal pain. The history is provided by the patient.  Abdominal Pain Pain location:  LLQ and RLQ Pain quality: bloating and sharp   Pain radiates to:  Back Onset quality:  Gradual Timing:  Constant Chronicity:  Chronic Relieved by:  Nothing Worsened by:  Movement Associated symptoms: diarrhea, melena and nausea   Associated symptoms: no chest pain, no chills, no dysuria and no vomiting    [name] is a 53 y.o. female who presents to the ED with abdominal pain. The pain started 3 days ago as pressure she was evaluated at [facility] ED earlier this week and treated with IV Morphine, did a CT scan of abdomen and she was discharged home with Tramadol. The pain was a little better until today when she started having stabbing sharp pain and feeling full. She has a history of IBS with diarrhea, Diverticulitis and fatty liver. She is followed by Dr. Laural Golden for her GI problems.   Past Medical History  Diagnosis Date  . Diabetes mellitus   . GERD (gastroesophageal reflux disease)   . Anxiety disorder   . Chronic low back pain   . Fatty liver   . Hypertension   . Diabetic neuropathy   . Irritable bowel syndrome   . Diverticulitis    Past Surgical History  Procedure Laterality Date  . Upper gastrointestinal endoscopy  2008  . Ectopic pregnancy surgery  1996  . Bladder tact  2005  . Partial hysterectomy  2005  . Cholecystectomy  08/11  . [place]  2009    Preformed on the right shoulder  . Hernia repair  09/17/11  . Colonoscopy  2208  . Colonoscopy  In of September 2014    @ [facility]/Dr,.[name]   Family History  Problem Relation Age of Onset  .  Healthy Mother   . Heart disease Father   . Healthy Sister   . Esophageal cancer Brother   . Healthy Son   . Healthy Son    History  Substance Use Topics  . Smoking status: Never Smoker   . Smokeless tobacco: Never Used  . Alcohol Use: No   OB History   Grav Para Term Preterm Abortions TAB SAB Ect Mult Living                 Review of Systems  Constitutional: Negative for chills.  HENT: Negative.   Cardiovascular: Negative for chest pain.  Gastrointestinal: Positive for nausea, abdominal pain, diarrhea and melena. Negative for vomiting.  Genitourinary: Negative for dysuria.  Musculoskeletal: Negative for myalgias.  Skin: Negative for rash.  Neurological: Negative for headaches.  Psychiatric/Behavioral: Negative for confusion.      Allergies  Flagyl  Home Medications   Prior to Admission medications   Medication Sig Start Date End Date Taking? Authorizing Provider  acetaZOLAMIDE (DIAMOX) 250 MG tablet Take 250 mg by mouth daily.     Yes Historical Provider, MD  aliskiren (TEKTURNA) 300 MG tablet Take 300 mg by mouth daily.     Yes Historical Provider, MD  diazepam (VALIUM) 2 MG tablet Take 1 tablet (2 mg total) by mouth 2 (two) times daily. 02/09/14  Yes [name],  MD  DULoxetine (CYMBALTA) 60 MG capsule Take 60 mg by mouth daily.   Yes Historical Provider, MD  estrogens, conjugated, (PREMARIN) 0.9 MG tablet Take 0.9 mg by mouth daily.    Yes Historical Provider, MD  FIBER FORMULA PO Take 2 tablets by mouth every morning.    Yes Historical Provider, MD  fluticasone (FLONASE) 50 MCG/ACT nasal spray Place 2 sprays into both nostrils every morning.    Yes Historical Provider, MD  glyBURIDE-metformin (GLUCOVANCE) 5-500 MG per tablet Take 1 tablet by mouth 2 (two) times daily.    Yes Historical Provider, MD  HYDROcodone-acetaminophen (NORCO/VICODIN) 5-325 MG per tablet Take 1 tablet by mouth daily as needed for moderate pain.   Yes Historical Provider, MD  metoprolol  (LOPRESSOR) 50 MG tablet Take 25 mg by mouth every morning. Patient takes 1/2 tablet daily   Yes Historical Provider, MD  omeprazole (PRILOSEC) 40 MG capsule Take 40 mg by mouth every morning.    Yes Historical Provider, MD  ondansetron (ZOFRAN-ODT) 8 MG disintegrating tablet Take 8 mg by mouth daily as needed for nausea or vomiting.  12/20/11  Yes Historical Provider, MD  VICTOZA 18 MG/3ML SOLN Inject 0.06 mg into the skin daily after supper.  01/30/12  Yes Historical Provider, MD   BP 114/81  Pulse 104  Temp(Src) 98.2 F (36.8 C) (Oral)  Resp 18  Ht 5' 2"  (1.575 m)  Wt 168 lb (76.204 kg)  BMI 30.72 kg/m2  SpO2 96% Physical Exam  Nursing note and vitals reviewed. Constitutional: She is oriented to person, place, and time. She appears well-developed and well-nourished.  HENT:  Head: Normocephalic.  Eyes: EOM are normal.  Neck: Neck supple.  Cardiovascular: Normal rate.   Pulmonary/Chest: Effort normal.  Abdominal: Soft. Bowel sounds are normal. There is tenderness in the right lower quadrant and suprapubic area. There is no rebound, no guarding and no CVA tenderness.  Genitourinary: Rectal exam shows no external hemorrhoid, no internal hemorrhoid, no fissure, no mass and no tenderness. Guaiac positive stool.  Musculoskeletal: Normal range of motion.  Neurological: She is alert and oriented to person, place, and time. No cranial nerve deficit.  Skin: Skin is warm and dry.  Psychiatric: She has a normal mood and affect. Her behavior is normal.    ED Course: IV hydration, Zofran, Dilaudid  Procedures Results for orders placed during the hospital encounter of 03/18/14 (from the past 24 hour(s))  COMPREHENSIVE METABOLIC PANEL     Status: Abnormal   Collection Time    03/18/14 10:15 PM      Result Value Ref Range   Sodium 137  137 - 147 mEq/L   Potassium 4.0  3.7 - 5.3 mEq/L   Chloride 101  96 - 112 mEq/L   CO2 22  19 - 32 mEq/L   Glucose, Bld 261 (*) 70 - 99 mg/dL   BUN 15  6 - 23  mg/dL   Creatinine, Ser 0.71  0.50 - 1.10 mg/dL   Calcium 8.6  8.4 - 10.5 mg/dL   Total Protein 7.8  6.0 - 8.3 g/dL   Albumin 3.0 (*) 3.5 - 5.2 g/dL   AST 98 (*) 0 - 37 U/L   ALT 35  0 - 35 U/L   Alkaline Phosphatase 63  39 - 117 U/L   Total Bilirubin 0.1 (*) 0.3 - 1.2 mg/dL   GFR calc non Af Amer >90  >90 mL/min   GFR calc Af Amer >90  >90 mL/min  CBC  WITH DIFFERENTIAL     Status: Abnormal   Collection Time    03/18/14 10:15 PM      Result Value Ref Range   WBC 10.7 (*) 4.0 - 10.5 K/uL   RBC 4.04  3.87 - 5.11 MIL/uL   Hemoglobin 11.5 (*) 12.0 - 15.0 g/dL   HCT 35.2 (*) 36.0 - 46.0 %   MCV 87.1  78.0 - 100.0 fL   MCH 28.5  26.0 - 34.0 pg   MCHC 32.7  30.0 - 36.0 g/dL   RDW 14.5  11.5 - 15.5 %   Platelets 222  150 - 400 K/uL   Neutrophils Relative % 55  43 - 77 %   Neutro Abs 6.0  1.7 - 7.7 K/uL   Lymphocytes Relative 37  12 - 46 %   Lymphs Abs 3.9  0.7 - 4.0 K/uL   Monocytes Relative 6  3 - 12 %   Monocytes Absolute 0.6  0.1 - 1.0 K/uL   Eosinophils Relative 2  0 - 5 %   Eosinophils Absolute 0.2  0.0 - 0.7 K/uL   Basophils Relative 0  0 - 1 %   Basophils Absolute 0.0  0.0 - 0.1 K/uL  TYPE AND SCREEN     Status: None   Collection Time    03/18/14 10:15 PM      Result Value Ref Range   ABO/RH(D) A NEG     Antibody Screen NEG     Sample Expiration 03/21/2014      @ 0015 patient started vomiting and having pain again. Phenergan 12.5 mg IV given. MDM  Will plan to repeat CBC at 2:30 am Dr. Lynford Citizen to assume care of the patient.     [place], [place] 03/19/14 [phone]

## 2014-03-18 NOTE — ED Notes (Signed)
Pt had work up at [employer] this week, has not followed up with Dr Laural Golden. States she see bright red blood with bowel movements, states she has 8 to 12 a day

## 2014-03-18 NOTE — ED Notes (Signed)
Patient reports lower abdominal pain and pressure x 3 days. States also has seen blood in stool today and has been vomiting.

## 2014-03-19 LAB — CBC WITH DIFFERENTIAL/PLATELET
BASOS ABS: 0 10*3/uL (ref 0.0–0.1)
Basophils Relative: 0 % (ref 0–1)
EOS ABS: 0.3 10*3/uL (ref 0.0–0.7)
Eosinophils Relative: 2 % (ref 0–5)
HCT: 33.3 % — ABNORMAL LOW (ref 36.0–46.0)
HEMOGLOBIN: 10.9 g/dL — AB (ref 12.0–15.0)
Lymphocytes Relative: 41 % (ref 12–46)
Lymphs Abs: 4.6 10*3/uL — ABNORMAL HIGH (ref 0.7–4.0)
MCH: 28.5 pg (ref 26.0–34.0)
MCHC: 32.7 g/dL (ref 30.0–36.0)
MCV: 86.9 fL (ref 78.0–100.0)
MONOS PCT: 6 % (ref 3–12)
Monocytes Absolute: 0.6 10*3/uL (ref 0.1–1.0)
NEUTROS PCT: 51 % (ref 43–77)
Neutro Abs: 5.6 10*3/uL (ref 1.7–7.7)
PLATELETS: 201 10*3/uL (ref 150–400)
RBC: 3.83 MIL/uL — ABNORMAL LOW (ref 3.87–5.11)
RDW: 14.5 % (ref 11.5–15.5)
WBC: 11.1 10*3/uL — ABNORMAL HIGH (ref 4.0–10.5)

## 2014-03-19 LAB — URINALYSIS, ROUTINE W REFLEX MICROSCOPIC
Bilirubin Urine: NEGATIVE
Glucose, UA: NEGATIVE mg/dL
Hgb urine dipstick: NEGATIVE
KETONES UR: NEGATIVE mg/dL
Leukocytes, UA: NEGATIVE
NITRITE: NEGATIVE
Protein, ur: NEGATIVE mg/dL
Specific Gravity, Urine: 1.01 (ref 1.005–1.030)
UROBILINOGEN UA: 1 mg/dL (ref 0.0–1.0)
pH: 8 (ref 5.0–8.0)

## 2014-03-19 MED ORDER — HYDROMORPHONE HCL PF 1 MG/ML IJ SOLN
1.0000 mg | Freq: Once | INTRAMUSCULAR | Status: AC
  Administered 2014-03-19: 1 mg via INTRAVENOUS
  Filled 2014-03-19: qty 1

## 2014-03-19 MED ORDER — PROMETHAZINE HCL 25 MG/ML IJ SOLN
12.5000 mg | Freq: Once | INTRAMUSCULAR | Status: AC
  Administered 2014-03-19: 12.5 mg via INTRAVENOUS
  Filled 2014-03-19: qty 1

## 2014-03-19 MED ORDER — HYDROCODONE-ACETAMINOPHEN 5-325 MG PO TABS: 1.0000 | ORAL_TABLET | Freq: Four times a day (QID) | ORAL | Status: DC | PRN

## 2014-03-19 MED ORDER — ONDANSETRON 8 MG PO TBDP: 8.0000 mg | ORAL_TABLET | Freq: Three times a day (TID) | ORAL | Status: DC | PRN

## 2014-03-19 MED ORDER — HYDROCODONE-ACETAMINOPHEN 5-325 MG PO TABS
2.0000 | ORAL_TABLET | Freq: Once | ORAL | Status: AC
  Administered 2014-03-19: 2 via ORAL
  Filled 2014-03-19: qty 2

## 2014-03-19 NOTE — ED Notes (Signed)
Pt complaining of nausea and vomiting, PA aware,orders given

## 2014-03-19 NOTE — ED Provider Notes (Signed)
Medical screening examination/treatment/procedure(s) were conducted as a shared visit with non-physician practitioner(s) and myself.  I personally evaluated the patient during the encounter.   EKG Interpretation None      Pt w/ abd pain. Hx of IBS and diverticular dz. Pt has chronic pain - which has gotten worse recently. Had CT scan at OSH - and it was benign just 2 days back. No peritoneal signs on our exam. Pt also c/o dark stools - Hb x 2 ordered, and stayed stable with no melena in the ER. Stable for d/c. Pain control via oral meds. GI f/u requested.  Varney Biles, MD 03/19/14 [phone]

## 2014-03-19 NOTE — Discharge Instructions (Signed)
Abdominal Pain, Women °Abdominal (stomach, pelvic, or belly) pain can be caused by many things. It is important to tell your doctor: °· The location of the pain. °· Does it come and go or is it present all the time? °· Are there things that start the pain (eating certain foods, exercise)? °· Are there other symptoms associated with the pain (fever, nausea, vomiting, diarrhea)? °All of this is helpful to know when trying to find the cause of the pain. °CAUSES  °· Stomach: virus or bacteria infection, or ulcer. °· Intestine: appendicitis (inflamed appendix), regional ileitis (Crohn's disease), ulcerative colitis (inflamed colon), irritable bowel syndrome, diverticulitis (inflamed diverticulum of the colon), or cancer of the stomach or intestine. °· Gallbladder disease or stones in the gallbladder. °· Kidney disease, kidney stones, or infection. °· Pancreas infection or cancer. °· Fibromyalgia (pain disorder). °· Diseases of the female organs: °· Uterus: fibroid (non-cancerous) tumors or infection. °· Fallopian tubes: infection or tubal pregnancy. °· Ovary: cysts or tumors. °· Pelvic adhesions (scar tissue). °· Endometriosis (uterus lining tissue growing in the pelvis and on the pelvic organs). °· Pelvic congestion syndrome (female organs filling up with blood just before the menstrual period). °· Pain with the menstrual period. °· Pain with ovulation (producing an egg). °· Pain with an IUD (intrauterine device, birth control) in the uterus. °· Cancer of the female organs. °· Functional pain (pain not caused by a disease, may improve without treatment). °· Psychological pain. °· Depression. °DIAGNOSIS  °Your doctor will decide the seriousness of your pain by doing an examination. °· Blood tests. °· X-rays. °· Ultrasound. °· CT scan (computed tomography, special type of X-ray). °· MRI (magnetic resonance imaging). °· Cultures, for infection. °· Barium enema (dye inserted in the large intestine, to better view it with  X-rays). °· Colonoscopy (looking in intestine with a lighted tube). °· Laparoscopy (minor surgery, looking in abdomen with a lighted tube). °· Major abdominal exploratory surgery (looking in abdomen with a large incision). °TREATMENT  °The treatment will depend on the cause of the pain.  °· Many cases can be observed and treated at home. °· Over-the-counter medicines recommended by your caregiver. °· Prescription medicine. °· Antibiotics, for infection. °· Birth control pills, for painful periods or for ovulation pain. °· Hormone treatment, for endometriosis. °· Nerve blocking injections. °· Physical therapy. °· Antidepressants. °· Counseling with a psychologist or psychiatrist. °· Minor or major surgery. °HOME CARE INSTRUCTIONS  °· Do not take laxatives, unless directed by your caregiver. °· Take over-the-counter pain medicine only if ordered by your caregiver. Do not take aspirin because it can cause an upset stomach or bleeding. °· Try a clear liquid diet (broth or water) as ordered by your caregiver. Slowly move to a bland diet, as tolerated, if the pain is related to the stomach or intestine. °· Have a thermometer and take your temperature several times a day, and record it. °· Bed rest and sleep, if it helps the pain. °· Avoid sexual intercourse, if it causes pain. °· Avoid stressful situations. °· Keep your follow-up appointments and tests, as your caregiver orders. °· If the pain does not go away with medicine or surgery, you may try: °· Acupuncture. °· Relaxation exercises (yoga, meditation). °· Group therapy. °· Counseling. °SEEK MEDICAL CARE IF:  °· You notice certain foods cause stomach pain. °· Your home care treatment is not helping your pain. °· You need stronger pain medicine. °· You want your IUD removed. °· You feel faint or   lightheaded. °· You develop nausea and vomiting. °· You develop a rash. °· You are having side effects or an allergy to your medicine. °SEEK IMMEDIATE MEDICAL CARE IF:  °· Your  pain does not go away or gets worse. °· You have a fever. °· Your pain is felt only in portions of the abdomen. The right side could possibly be appendicitis. The left lower portion of the abdomen could be colitis or diverticulitis. °· You are passing blood in your stools (bright red or black tarry stools, with or without vomiting). °· You have blood in your urine. °· You develop chills, with or without a fever. °· You pass out. °MAKE SURE YOU:  °· Understand these instructions. °· Will watch your condition. °· Will get help right away if you are not doing well or get worse. °Document Released: 07/20/2007 Document Revised: 12/15/2011 Document Reviewed: 08/09/2009 °[employer]® Patient Information ©2014 [employer]. ° °

## 2014-03-19 NOTE — ED Notes (Signed)
Patient given discharge instruction, verbalized understand. IV removed, band aid applied. Patient ambulatory out of the department.

## 2014-03-20 ENCOUNTER — Ambulatory Visit (INDEPENDENT_AMBULATORY_CARE_PROVIDER_SITE_OTHER): Payer: Medicaid Other | Admitting: Internal Medicine

## 2014-03-20 LAB — POC OCCULT BLOOD, ED: FECAL OCCULT BLD: POSITIVE — AB

## 2014-05-02 ENCOUNTER — Ambulatory Visit (INDEPENDENT_AMBULATORY_CARE_PROVIDER_SITE_OTHER): Payer: Medicaid Other | Admitting: Internal Medicine

## 2014-05-02 ENCOUNTER — Encounter (INDEPENDENT_AMBULATORY_CARE_PROVIDER_SITE_OTHER): Payer: Self-pay | Admitting: Internal Medicine

## 2014-05-02 VITALS — BP 110/82 | HR 76 | Temp 97.5°F | Resp 18 | Ht 62.0 in | Wt 173.6 lb

## 2014-05-02 DIAGNOSIS — K589 Irritable bowel syndrome without diarrhea: Secondary | ICD-10-CM

## 2014-05-02 DIAGNOSIS — K760 Fatty (change of) liver, not elsewhere classified: Secondary | ICD-10-CM

## 2014-05-02 DIAGNOSIS — K7689 Other specified diseases of liver: Secondary | ICD-10-CM

## 2014-05-02 DIAGNOSIS — R112 Nausea with vomiting, unspecified: Secondary | ICD-10-CM

## 2014-05-02 NOTE — Patient Instructions (Signed)
IBgard one capsule by mouth up to 3 times a day. Check for Dr. Woody Seller if you could come off metformin to see if GI symptoms would improve. If you do come off metformin call us with progress report in 8 weeks.

## 2014-05-02 NOTE — Progress Notes (Signed)
Presenting complaint;  Followup for GERD nausea vomiting abdominal pain and diarrhea.  Subjective:  Patient is 53 year old Caucasian female who has multiple GI problems and is here for scheduled visit. She was last seen on 01/16/2014. Following that visit she underwent Smart pill study and gastric emptying study was within normal limits. She was given samples of IBgard which she has finished using. She has noted less abdominal pain and less diarrhea with this medication. However she continues to experience nausea every day and vomiting occurs in spells. Last episode was 6 weeks ago. She is having less diarrhea and now has 2-3 bowel movements on most days. She denies constipation. Her appetite is fair and her weight has been stable. She has not been able to exercise since she is sprained left ankle about a month ago. She states she is under a lot of stress as she has been in custody battle for her grandson. She was seen in emergency room and [facility] about 7 weeks ago and in ER at [facility] 6 weeks ago for abdominal pain. She had abdominopelvic CT at [facility] in no acute abnormalities noted. She was treated and discharged.    Current Medications: Outpatient Encounter Prescriptions as of 05/02/2014  Medication Sig  . acetaZOLAMIDE (DIAMOX) 250 MG tablet Take 250 mg by mouth daily.    Marland Kitchen aliskiren (TEKTURNA) 300 MG tablet Take 300 mg by mouth daily.    . diazepam (VALIUM) 2 MG tablet Take 1 tablet (2 mg total) by mouth 2 (two) times daily.  . DULoxetine (CYMBALTA) 60 MG capsule Take 60 mg by mouth daily.  Marland Kitchen estrogens, conjugated, (PREMARIN) 0.9 MG tablet Take 0.9 mg by mouth daily.   Marland Kitchen FIBER FORMULA PO Take 2 tablets by mouth every morning.   . fluticasone (FLONASE) 50 MCG/ACT nasal spray Place 2 sprays into both nostrils every morning.   . glyBURIDE-metformin (GLUCOVANCE) 5-500 MG per tablet Take 1 tablet by mouth 2 (two) times daily.   Marland Kitchen HYDROcodone-acetaminophen (NORCO/VICODIN) 5-325 MG per tablet Take 1  tablet by mouth daily as needed for moderate pain.  . metoprolol (LOPRESSOR) 50 MG tablet Take 25 mg by mouth every morning. Patient takes 1/2 tablet daily  . omeprazole (PRILOSEC) 40 MG capsule Take 40 mg by mouth every morning.   . ondansetron (ZOFRAN ODT) 8 MG disintegrating tablet Take 1 tablet (8 mg total) by mouth every 8 (eight) hours as needed for nausea.  Marland Kitchen VICTOZA 18 MG/3ML SOLN Inject 0.06 mg into the skin daily after supper.   . [DISCONTINUED] HYDROcodone-acetaminophen (NORCO/VICODIN) 5-325 MG per tablet Take 1 tablet by mouth every 6 (six) hours as needed.  . [DISCONTINUED] ondansetron (ZOFRAN-ODT) 8 MG disintegrating tablet Take 8 mg by mouth daily as needed for nausea or vomiting.      Objective: Blood pressure 110/82, pulse 76, temperature 97.5 F (36.4 C), temperature source Oral, resp. rate 18, height 5' 2"  (1.575 m), weight 173 lb 9.6 oz (78.744 kg). Patient is alert and in no acute distress. Conjunctiva is pink. Sclera is nonicteric Oropharyngeal mucosa is normal. No neck masses or thyromegaly noted. Cardiac exam with regular rhythm normal S1 and S2. No murmur or gallop noted. Lungs are clear to auscultation. Abdomen is full. Bowel sounds are normal. On palpation abdomen is soft with mild epigastric tenderness. No organomegaly or masses  No LE edema or clubbing noted.  Labs/studies Results: Lab results from 03/18/2014 noted. WBCs 10.7 and H&H was 11.5 and 35.2 and platelet count  222K AST was 98  and ALT 35     Assessment:  #1. Nausea and vomiting. She has episodic symptoms without clear etiology. She was thought to have gastroparesis but gastric emptying was normal with Smart pill study. Heartburn is well controlled with therapy. #2. Irritable bowel syndrome. She seemed to have responded to IBgard. It has helped for the diarrhea and abdominal pain. She therefore can use it on an as-needed basis. #3. Elevated transaminases secondary to fatty  liver.   Plan:  Use IBgard 1 capsule by mouth 3 times a day when necessary. Check with Dr. Woody Seller if you could come off metformin; If metformin discontinued please call office with progress report in 4 weeks. Office visit in 6 months.

## 2014-06-06 ENCOUNTER — Encounter (INDEPENDENT_AMBULATORY_CARE_PROVIDER_SITE_OTHER): Payer: Self-pay

## 2014-06-25 ENCOUNTER — Emergency Department (HOSPITAL_COMMUNITY)
Admission: EM | Admit: 2014-06-25 | Discharge: 2014-06-25 | Disposition: A | Discharge status: Home / Self Care | Payer: Medicaid Other | Attending: Emergency Medicine | Admitting: Emergency Medicine

## 2014-06-25 ENCOUNTER — Encounter (HOSPITAL_COMMUNITY): Payer: Self-pay | Admitting: Emergency Medicine

## 2014-06-25 DIAGNOSIS — E1142 Type 2 diabetes mellitus with diabetic polyneuropathy: Secondary | ICD-10-CM | POA: Diagnosis not present

## 2014-06-25 DIAGNOSIS — E1149 Type 2 diabetes mellitus with other diabetic neurological complication: Secondary | ICD-10-CM | POA: Diagnosis not present

## 2014-06-25 DIAGNOSIS — R1031 Right lower quadrant pain: Secondary | ICD-10-CM | POA: Diagnosis not present

## 2014-06-25 DIAGNOSIS — Z87442 Personal history of urinary calculi: Secondary | ICD-10-CM | POA: Diagnosis not present

## 2014-06-25 DIAGNOSIS — G8929 Other chronic pain: Secondary | ICD-10-CM | POA: Diagnosis not present

## 2014-06-25 DIAGNOSIS — Z79899 Other long term (current) drug therapy: Secondary | ICD-10-CM | POA: Diagnosis not present

## 2014-06-25 DIAGNOSIS — F411 Generalized anxiety disorder: Secondary | ICD-10-CM | POA: Diagnosis not present

## 2014-06-25 DIAGNOSIS — IMO0002 Reserved for concepts with insufficient information to code with codable children: Secondary | ICD-10-CM | POA: Diagnosis not present

## 2014-06-25 DIAGNOSIS — I10 Essential (primary) hypertension: Secondary | ICD-10-CM | POA: Diagnosis not present

## 2014-06-25 DIAGNOSIS — K219 Gastro-esophageal reflux disease without esophagitis: Secondary | ICD-10-CM | POA: Diagnosis not present

## 2014-06-25 DIAGNOSIS — R109 Unspecified abdominal pain: Secondary | ICD-10-CM | POA: Diagnosis present

## 2014-06-25 DIAGNOSIS — R319 Hematuria, unspecified: Secondary | ICD-10-CM | POA: Insufficient documentation

## 2014-06-25 DIAGNOSIS — K589 Irritable bowel syndrome without diarrhea: Secondary | ICD-10-CM | POA: Insufficient documentation

## 2014-06-25 HISTORY — DX: Noninfective gastroenteritis and colitis, unspecified: K52.9

## 2014-06-25 HISTORY — DX: Meniere's disease, unspecified ear: H81.09

## 2014-06-25 LAB — URINALYSIS, ROUTINE W REFLEX MICROSCOPIC
Bilirubin Urine: NEGATIVE
GLUCOSE, UA: NEGATIVE mg/dL
KETONES UR: NEGATIVE mg/dL
LEUKOCYTES UA: NEGATIVE
Nitrite: NEGATIVE
Specific Gravity, Urine: 1.02 (ref 1.005–1.030)
Urobilinogen, UA: 0.2 mg/dL (ref 0.0–1.0)
pH: 6 (ref 5.0–8.0)

## 2014-06-25 LAB — URINE MICROSCOPIC-ADD ON

## 2014-06-25 MED ORDER — OXYCODONE-ACETAMINOPHEN 5-325 MG PO TABS
2.0000 | ORAL_TABLET | Freq: Once | ORAL | Status: AC
  Administered 2014-06-25: 2 via ORAL
  Filled 2014-06-25: qty 2

## 2014-06-25 NOTE — ED Notes (Signed)
Patient had ureteral stent removed Thursday d/t UTI.  Has been hurting since and now pain is severe.  Hx of renal stones that she has been unable to pass. Stent was done by Dr. Exie Parody.

## 2014-06-25 NOTE — ED Provider Notes (Addendum)
CSN: [id number]     Arrival date & time 06/25/14  1642 History   First MD Initiated Contact with Patient 06/25/14 1727     Chief Complaint  Patient presents with  . Abdominal Pain     (Consider location/radiation/quality/duration/timing/severity/associated sxs/prior Treatment) HPI Comments: 53 year old female, history of recent kidney stone, was admitted at [facility] because of a kidney stone on the right side which was too big to pass spontaneously, was admitted to the hospital and underwent cystoscopy with basket removal and stent placement. The patient states that she was in significant discomfort, was readmitted to the hospital, the stent was removed and she was discharged on ciprofloxacin for treatment of possible urinary tract infection. Since being discharged in the last 2 days she has had ongoing intermittent pain on the right side of her abdomen which is persistent but seems to respond to medications such as ibuprofen and hydrocodone which she has been using regularly. As the medicine wears off the pain gets worse but it is not associated with fevers, chills, nausea, vomiting. The pain is always located in the right mid to lower abdomen. She denies dysuria, denies left-sided abdominal pain.  Patient is a 53 y.o. female presenting with abdominal pain. The history is provided by the patient.  Abdominal Pain   Past Medical History  Diagnosis Date  . Diabetes mellitus   . GERD (gastroesophageal reflux disease)   . Anxiety disorder   . Chronic low back pain   . Fatty liver   . Hypertension   . Diabetic neuropathy   . Irritable bowel syndrome   . Diverticulitis   . Colitis   . Meniere's disease    Past Surgical History  Procedure Laterality Date  . Upper gastrointestinal endoscopy  2008  . Ectopic pregnancy surgery  1996  . Bladder tact  2005  . Partial hysterectomy  2005  . Cholecystectomy  08/11  . [place]  2009    Preformed on the right shoulder  . Hernia repair   09/17/11  . Colonoscopy  2208  . Colonoscopy  In of September 2014    @ [facility]/Dr,.[name]  . Ureteral stent placement     Family History  Problem Relation Age of Onset  . Healthy Mother   . Heart disease Father   . Healthy Sister   . Esophageal cancer Brother   . Healthy Son   . Healthy Son    History  Substance Use Topics  . Smoking status: Never Smoker   . Smokeless tobacco: Never Used  . Alcohol Use: No   OB History   Grav Para Term Preterm Abortions TAB SAB Ect Mult Living                 Review of Systems  Gastrointestinal: Positive for abdominal pain.  All other systems reviewed and are negative.     Allergies  Flagyl  Home Medications   Prior to Admission medications   Medication Sig Start Date End Date Taking? Authorizing Provider  acetaZOLAMIDE (DIAMOX) 250 MG tablet Take 250 mg by mouth daily.      Historical Provider, MD  aliskiren (TEKTURNA) 300 MG tablet Take 300 mg by mouth daily.      Historical Provider, MD  diazepam (VALIUM) 2 MG tablet Take 1 tablet (2 mg total) by mouth 2 (two) times daily. 02/09/14   [name], MD  DULoxetine (CYMBALTA) 60 MG capsule Take 60 mg by mouth daily.    Historical Provider, MD  estrogens, conjugated, (  PREMARIN) 0.9 MG tablet Take 0.9 mg by mouth daily.     Historical Provider, MD  FIBER FORMULA PO Take 2 tablets by mouth every morning.     Historical Provider, MD  fluticasone (FLONASE) 50 MCG/ACT nasal spray Place 2 sprays into both nostrils every morning.     Historical Provider, MD  glyBURIDE-metformin (GLUCOVANCE) 5-500 MG per tablet Take 1 tablet by mouth 2 (two) times daily.     Historical Provider, MD  metoprolol (LOPRESSOR) 50 MG tablet Take 25 mg by mouth every morning. Patient takes 1/2 tablet daily    Historical Provider, MD  omeprazole (PRILOSEC) 40 MG capsule Take 40 mg by mouth every morning.     Historical Provider, MD  ondansetron (ZOFRAN ODT) 8 MG disintegrating tablet Take 1 tablet (8 mg total) by mouth  every 8 (eight) hours as needed for nausea. 03/19/14   Varney Biles, MD  VICTOZA 18 MG/3ML SOLN Inject 0.06 mg into the skin daily after supper.  01/30/12   Historical Provider, MD   BP 114/70  Pulse 83  Temp(Src) 98.5 F (36.9 C) (Oral)  Resp 18  Ht 5' 2"  (1.575 m)  Wt 167 lb (75.751 kg)  BMI 30.54 kg/m2  SpO2 98% Physical Exam  Nursing note and vitals reviewed. Constitutional: She appears well-developed and well-nourished. No distress.  HENT:  Head: Normocephalic and atraumatic.  Mouth/Throat: Oropharynx is clear and moist. No oropharyngeal exudate.  Eyes: Conjunctivae and EOM are normal. Pupils are equal, round, and reactive to light. Right eye exhibits no discharge. Left eye exhibits no discharge. No scleral icterus.  Neck: Normal range of motion. Neck supple. No JVD present. No thyromegaly present.  Cardiovascular: Normal rate, regular rhythm, normal heart sounds and intact distal pulses.  Exam reveals no gallop and no friction rub.   No murmur heard. Pulmonary/Chest: Effort normal and breath sounds normal. No respiratory distress. She has no wheezes. She has no rales.  Abdominal: Soft. Bowel sounds are normal. She exhibits no distension and no mass. There is tenderness ( minimal ttp in the R lower abd / R mid abd.  no guarding, no CVA ttp).  Musculoskeletal: Normal range of motion. She exhibits no edema and no tenderness.  Lymphadenopathy:    She has no cervical adenopathy.  Neurological: She is alert. Coordination normal.  Skin: Skin is warm and dry. No rash noted. No erythema.  Psychiatric: She has a normal mood and affect. Her behavior is normal.    ED Course  Procedures (including critical care time) Labs Review Labs Reviewed  URINALYSIS, ROUTINE W REFLEX MICROSCOPIC - Abnormal; Notable for the following:    APPearance CLOUDY (*)    Hgb urine dipstick LARGE (*)    Protein, ur TRACE (*)    All other components within normal limits  URINE MICROSCOPIC-ADD ON -  Abnormal; Notable for the following:    Squamous Epithelial / LPF FEW (*)    All other components within normal limits    Imaging Review No results found.   MDM   Final diagnoses:  Right lower quadrant abdominal pain  Flank pain  Hematuria    At this time the patient has ongoing pain but has totally normal vital signs. Check urinalysis to evaluate for ongoing infection, I suspect there would be some postoperative post stent removal pain, she has had many CT scans in the last month according to her report and at this time I do not see the need to perform another CT scan.  CT  scan from 9/16 reviewed - no hydro on CT.  No acute findings on CT  Ultrasound reveals no signs of hydronephrosis, hematuria found on urinalysis but no significant infection, patient can continue on ciprofloxacin and follow up with her urologist. These findings were discussed with the patient who is in agreement.  She already has hydrocodone 10 mg tablets, no other pain medications will be given for the patient to take home  Out more significant source of pain such as appendicitis as the patient has tenderness that wraps around to her flank. In fact her pain is very superficial in that even light touch of the right flank causes the pain. This makes appendicitis much less likely.  Meds given in ED:  Medications  oxyCODONE-acetaminophen (PERCOCET/ROXICET) 5-325 MG per tablet 2 tablet (2 tablets Oral Given 06/25/14 1819)    New Prescriptions   No medications on file      Johnna Acosta, MD 06/25/14 1828  Johnna Acosta, MD 06/25/14 [id number]

## 2014-09-06 ENCOUNTER — Encounter (INDEPENDENT_AMBULATORY_CARE_PROVIDER_SITE_OTHER): Payer: Self-pay | Admitting: *Deleted

## 2014-09-21 ENCOUNTER — Ambulatory Visit (INDEPENDENT_AMBULATORY_CARE_PROVIDER_SITE_OTHER): Payer: Medicare Other | Admitting: Internal Medicine

## 2014-11-07 ENCOUNTER — Ambulatory Visit (INDEPENDENT_AMBULATORY_CARE_PROVIDER_SITE_OTHER): Payer: Medicaid Other | Admitting: Internal Medicine

## 2014-11-28 ENCOUNTER — Encounter (HOSPITAL_COMMUNITY): Payer: Self-pay | Admitting: *Deleted

## 2014-11-28 ENCOUNTER — Emergency Department (HOSPITAL_COMMUNITY)
Admission: EM | Admit: 2014-11-28 | Discharge: 2014-11-28 | Disposition: A | Discharge status: Home / Self Care | Payer: Medicare Other | Attending: Emergency Medicine | Admitting: Emergency Medicine

## 2014-11-28 ENCOUNTER — Emergency Department (HOSPITAL_COMMUNITY): Payer: Medicare Other

## 2014-11-28 DIAGNOSIS — I10 Essential (primary) hypertension: Secondary | ICD-10-CM | POA: Diagnosis not present

## 2014-11-28 DIAGNOSIS — F419 Anxiety disorder, unspecified: Secondary | ICD-10-CM | POA: Diagnosis not present

## 2014-11-28 DIAGNOSIS — E1140 Type 2 diabetes mellitus with diabetic neuropathy, unspecified: Secondary | ICD-10-CM | POA: Insufficient documentation

## 2014-11-28 DIAGNOSIS — K219 Gastro-esophageal reflux disease without esophagitis: Secondary | ICD-10-CM | POA: Insufficient documentation

## 2014-11-28 DIAGNOSIS — W1839XA Other fall on same level, initial encounter: Secondary | ICD-10-CM | POA: Insufficient documentation

## 2014-11-28 DIAGNOSIS — S060X0A Concussion without loss of consciousness, initial encounter: Secondary | ICD-10-CM | POA: Insufficient documentation

## 2014-11-28 DIAGNOSIS — Y998 Other external cause status: Secondary | ICD-10-CM | POA: Insufficient documentation

## 2014-11-28 DIAGNOSIS — S0990XA Unspecified injury of head, initial encounter: Secondary | ICD-10-CM | POA: Diagnosis present

## 2014-11-28 DIAGNOSIS — Y9289 Other specified places as the place of occurrence of the external cause: Secondary | ICD-10-CM | POA: Diagnosis not present

## 2014-11-28 DIAGNOSIS — G8929 Other chronic pain: Secondary | ICD-10-CM | POA: Insufficient documentation

## 2014-11-28 DIAGNOSIS — Y9389 Activity, other specified: Secondary | ICD-10-CM | POA: Insufficient documentation

## 2014-11-28 MED ORDER — HYDROCODONE-ACETAMINOPHEN 5-325 MG PO TABS: 1.0000 | ORAL_TABLET | ORAL | Status: DC | PRN

## 2014-11-28 MED ORDER — OXYCODONE-ACETAMINOPHEN 5-325 MG PO TABS
1.0000 | ORAL_TABLET | Freq: Once | ORAL | Status: AC
  Administered 2014-11-28: 1 via ORAL
  Filled 2014-11-28: qty 1

## 2014-11-28 MED ORDER — ONDANSETRON 4 MG PO TBDP
4.0000 mg | ORAL_TABLET | Freq: Once | ORAL | Status: AC
  Administered 2014-11-28: 4 mg via ORAL
  Filled 2014-11-28: qty 1

## 2014-11-28 NOTE — ED Notes (Signed)
Pt has Minere's disease, became dizzy and fell. Struck head on table. No LOC. "seeing spots". Nausea now. Both arms hurt.  And rt leg.  Pt was at [facility] and left after 3 hours

## 2014-11-28 NOTE — Discharge Instructions (Signed)
Concussion  A concussion, or closed-head injury, is a brain injury caused by a direct blow to the head or by a quick and sudden movement (jolt) of the head or neck. Concussions are usually not life-threatening. Even so, the effects of a concussion can be serious. If you have had a concussion before, you are more likely to experience concussion-like symptoms after a direct blow to the head.   CAUSES  · Direct blow to the head, such as from running into another player during a soccer game, being hit in a fight, or hitting your head on a hard surface.  · A jolt of the head or neck that causes the brain to move back and forth inside the skull, such as in a car crash.  SIGNS AND SYMPTOMS  The signs of a concussion can be hard to notice. Early on, they may be missed by you, family members, and health care providers. You may look fine but act or feel differently.  Symptoms are usually temporary, but they may last for days, weeks, or even longer. Some symptoms may appear right away while others may not show up for hours or days. Every head injury is different. Symptoms include:  · Mild to moderate headaches that will not go away.  · A feeling of pressure inside your head.  · Having more trouble than usual:  ¨ Learning or remembering things you have heard.  ¨ Answering questions.  ¨ Paying attention or concentrating.  ¨ Organizing daily tasks.  ¨ Making decisions and solving problems.  · Slowness in thinking, acting or reacting, speaking, or reading.  · Getting lost or being easily confused.  · Feeling tired all the time or lacking energy (fatigued).  · Feeling drowsy.  · Sleep disturbances.  ¨ Sleeping more than usual.  ¨ Sleeping less than usual.  ¨ Trouble falling asleep.  ¨ Trouble sleeping (insomnia).  · Loss of balance or feeling lightheaded or dizzy.  · Nausea or vomiting.  · Numbness or tingling.  · Increased sensitivity to:  ¨ Sounds.  ¨ Lights.  ¨ Distractions.  · Vision problems or eyes that tire  easily.  · Diminished sense of taste or smell.  · Ringing in the ears.  · Mood changes such as feeling sad or anxious.  · Becoming easily irritated or angry for little or no reason.  · Lack of motivation.  · Seeing or hearing things other people do not see or hear (hallucinations).  DIAGNOSIS  Your health care provider can usually diagnose a concussion based on a description of your injury and symptoms. He or she will ask whether you passed out (lost consciousness) and whether you are having trouble remembering events that happened right before and during your injury.  Your evaluation might include:  · A brain scan to look for signs of injury to the brain. Even if the test shows no injury, you may still have a concussion.  · Blood tests to be sure other problems are not present.  TREATMENT  · Concussions are usually treated in an emergency department, in urgent care, or at a clinic. You may need to stay in the hospital overnight for further treatment.  · Tell your health care provider if you are taking any medicines, including prescription medicines, over-the-counter medicines, and natural remedies. Some medicines, such as blood thinners (anticoagulants) and aspirin, may increase the chance of complications. Also tell your health care provider whether you have had alcohol or are taking illegal drugs. This information   may affect treatment.  · Your health care provider will send you home with important instructions to follow.  · How fast you will recover from a concussion depends on many factors. These factors include how severe your concussion is, what part of your brain was injured, your age, and how healthy you were before the concussion.  · Most people with mild injuries recover fully. Recovery can take time. In general, recovery is slower in older persons. Also, persons who have had a concussion in the past or have other medical problems may find that it takes longer to recover from their current injury.  HOME  CARE INSTRUCTIONS  General Instructions  · Carefully follow the directions your health care provider gave you.  · Only take over-the-counter or prescription medicines for pain, discomfort, or fever as directed by your health care provider.  · Take only those medicines that your health care provider has approved.  · Do not drink alcohol until your health care provider says you are well enough to do so. Alcohol and certain other drugs may slow your recovery and can put you at risk of further injury.  · If it is harder than usual to remember things, write them down.  · If you are easily distracted, try to do one thing at a time. For example, do not try to watch TV while fixing dinner.  · Talk with family members or close friends when making important decisions.  · Keep all follow-up appointments. Repeated evaluation of your symptoms is recommended for your recovery.  · Watch your symptoms and tell others to do the same. Complications sometimes occur after a concussion. Older adults with a brain injury may have a higher risk of serious complications, such as a blood clot on the brain.  · Tell your teachers, school nurse, school counselor, coach, athletic trainer, or work manager about your injury, symptoms, and restrictions. Tell them about what you can or cannot do. They should watch for:  ¨ Increased problems with attention or concentration.  ¨ Increased difficulty remembering or learning new information.  ¨ Increased time needed to complete tasks or assignments.  ¨ Increased irritability or decreased ability to cope with stress.  ¨ Increased symptoms.  · Rest. Rest helps the brain to heal. Make sure you:  ¨ Get plenty of sleep at night. Avoid staying up late at night.  ¨ Keep the same bedtime hours on weekends and weekdays.  ¨ Rest during the day. Take daytime naps or rest breaks when you feel tired.  · Limit activities that require a lot of thought or concentration. These include:  ¨ Doing homework or job-related  work.  ¨ Watching TV.  ¨ Working on the computer.  · Avoid any situation where there is potential for another head injury (football, hockey, soccer, basketball, martial arts, downhill snow sports and horseback riding). Your condition will get worse every time you experience a concussion. You should avoid these activities until you are evaluated by the appropriate follow-up health care providers.  Returning To Your Regular Activities  You will need to return to your normal activities slowly, not all at once. You must give your body and brain enough time for recovery.  · Do not return to sports or other athletic activities until your health care provider tells you it is safe to do so.  · Ask your health care provider when you can drive, ride a bicycle, or operate heavy machinery. Your ability to react may be slower after a   brain injury. Never do these activities if you are dizzy.  · Ask your health care provider about when you can return to work or school.  Preventing Another Concussion  It is very important to avoid another brain injury, especially before you have recovered. In rare cases, another injury can lead to permanent brain damage, brain swelling, or death. The risk of this is greatest during the first 7-10 days after a head injury. Avoid injuries by:  · Wearing a seat belt when riding in a car.  · Drinking alcohol only in moderation.  · Wearing a helmet when biking, skiing, skateboarding, skating, or doing similar activities.  · Avoiding activities that could lead to a second concussion, such as contact or recreational sports, until your health care provider says it is okay.  · Taking safety measures in your home.  ¨ Remove clutter and tripping hazards from floors and stairways.  ¨ Use grab bars in bathrooms and handrails by stairs.  ¨ Place non-slip mats on floors and in bathtubs.  ¨ Improve lighting in dim areas.  SEEK MEDICAL CARE IF:  · You have increased problems paying attention or  concentrating.  · You have increased difficulty remembering or learning new information.  · You need more time to complete tasks or assignments than before.  · You have increased irritability or decreased ability to cope with stress.  · You have more symptoms than before.  Seek medical care if you have any of the following symptoms for more than 2 weeks after your injury:  · Lasting (chronic) headaches.  · Dizziness or balance problems.  · Nausea.  · Vision problems.  · Increased sensitivity to noise or light.  · Depression or mood swings.  · Anxiety or irritability.  · Memory problems.  · Difficulty concentrating or paying attention.  · Sleep problems.  · Feeling tired all the time.  SEEK IMMEDIATE MEDICAL CARE IF:  · You have severe or worsening headaches. These may be a sign of a blood clot in the brain.  · You have weakness (even if only in one hand, leg, or part of the face).  · You have numbness.  · You have decreased coordination.  · You vomit repeatedly.  · You have increased sleepiness.  · One pupil is larger than the other.  · You have convulsions.  · You have slurred speech.  · You have increased confusion. This may be a sign of a blood clot in the brain.  · You have increased restlessness, agitation, or irritability.  · You are unable to recognize people or places.  · You have neck pain.  · It is difficult to wake you up.  · You have unusual behavior changes.  · You lose consciousness.  MAKE SURE YOU:  · Understand these instructions.  · Will watch your condition.  · Will get help right away if you are not doing well or get worse.  Document Released: 12/13/2003 Document Revised: 09/27/2013 Document Reviewed: 04/14/2013  [employer]® Patient Information ©2015 [employer]. This information is not intended to replace advice given to you by your health care provider. Make sure you discuss any questions you have with your health care provider.

## 2014-11-28 NOTE — ED Notes (Signed)
Discharge instructions given and reviewed with patient.  Patient verbalized understanding of sedating effects of Vicodin and not to drive while taking.  Patient ambulatory; discharged home in good condition.  Son to drive home.

## 2014-11-28 NOTE — ED Notes (Signed)
MD at bedside. 

## 2014-11-28 NOTE — ED Provider Notes (Signed)
CSN: [id number]     Arrival date & time 11/28/14  1723 History  This chart was scribed for Heather Rank, MD by Delphia Grates, ED Scribe. This patient was seen in room APA05/APA05 and the patient's care was started at 7:10 PM.   Chief Complaint  Patient presents with  . Fall    The history is provided by the patient. No language interpreter was used.     HPI Comments: [name] is a 54 y.o. female, with history of DM, GERD, HTN, who presents to the Emergency Department complaining of fall that occurred PTA. Patient reports history of Miniere's Disease and states she became dizzy when she fell and hit her head on a table. She denies LOC. There is associated nausea, HA, bilateral arm pain and right leg pain. Patient notes family history of aneurysms and is concerned for head trauma.   Past Medical History  Diagnosis Date  . Diabetes mellitus   . GERD (gastroesophageal reflux disease)   . Anxiety disorder   . Chronic low back pain   . Fatty liver   . Hypertension   . Diabetic neuropathy   . Irritable bowel syndrome   . Diverticulitis   . Colitis   . Meniere's disease    Past Surgical History  Procedure Laterality Date  . Upper gastrointestinal endoscopy  2008  . Ectopic pregnancy surgery  1996  . Bladder tact  2005  . Partial hysterectomy  2005  . Cholecystectomy  08/11  . Mumford  2009    Preformed on the right shoulder  . Hernia repair  09/17/11  . Colonoscopy  2208  . Colonoscopy  In of September 2014    @ [facility]/Dr,.[name]  . Ureteral stent placement    . Abdominal hysterectomy     Family History  Problem Relation Age of Onset  . Healthy Mother   . Heart disease Father   . Healthy Sister   . Esophageal cancer Brother   . Healthy Son   . Healthy Son    History  Substance Use Topics  . Smoking status: Never Smoker   . Smokeless tobacco: Never Used  . Alcohol Use: No   OB History    No data available     Review of Systems  A complete 10 system review  of systems was obtained and all systems are negative except as noted in the HPI and PMH.    Allergies  Flagyl  Home Medications   Prior to Admission medications   Medication Sig Start Date End Date Taking? Authorizing Provider  acetaZOLAMIDE (DIAMOX) 250 MG tablet Take 250 mg by mouth at bedtime.    Yes Historical Provider, MD  aliskiren (TEKTURNA) 300 MG tablet Take 300 mg by mouth at bedtime.    Yes Historical Provider, MD  DULoxetine (CYMBALTA) 60 MG capsule Take 60 mg by mouth at bedtime.    Yes Historical Provider, MD  estrogens, conjugated, (PREMARIN) 0.9 MG tablet Take 0.9 mg by mouth at bedtime.    Yes Historical Provider, MD  fluticasone (FLONASE) 50 MCG/ACT nasal spray Place 2 sprays into both nostrils every morning.    Yes Historical Provider, MD  glyBURIDE-metformin (GLUCOVANCE) 5-500 MG per tablet Take 1 tablet by mouth 2 (two) times daily.    Yes Historical Provider, MD  ibuprofen (ADVIL,MOTRIN) 200 MG tablet Take 600 mg by mouth daily as needed for mild pain or moderate pain.   Yes Historical Provider, MD  ibuprofen (ADVIL,MOTRIN) 600 MG tablet Take 600 mg  by mouth every 6 (six) hours as needed for moderate pain.  11/13/14  Yes Historical Provider, MD  metoprolol (LOPRESSOR) 50 MG tablet Take 25 mg by mouth every morning. Patient takes 1/2 tablet daily   Yes Historical Provider, MD  Nutritional Supplements (ESTROVEN PO) Take 1 tablet by mouth every evening.   Yes Historical Provider, MD  ondansetron (ZOFRAN ODT) 8 MG disintegrating tablet Take 1 tablet (8 mg total) by mouth every 8 (eight) hours as needed for nausea. Patient taking differently: Take 8 mg by mouth every 8 (eight) hours as needed for nausea. FOR NAUSEA 03/19/14  Yes Ankit Nanavati, MD  pantoprazole (PROTONIX) 40 MG tablet Take 40 mg by mouth daily. 11/04/14  Yes Historical Provider, MD  diazepam (VALIUM) 2 MG tablet Take 1 tablet (2 mg total) by mouth 2 (two) times daily. Patient not taking: Reported on 11/28/2014  02/09/14   [name], MD   Triage Vitals: BP 153/87 mmHg  Pulse 95  Temp(Src) 97.8 F (36.6 C) (Oral)  Resp 18  Ht 5' 2"  (1.575 m)  Wt 178 lb (80.74 kg)  BMI 32.55 kg/m2  SpO2 98%  Physical Exam  Constitutional: She appears well-developed and well-nourished. No distress.  HENT:  Head: Normocephalic and atraumatic.  Right Ear: External ear normal.  Left Ear: External ear normal.  Eyes: Conjunctivae are normal. Right eye exhibits no discharge. Left eye exhibits no discharge. No scleral icterus.  Neck: Neck supple. No tracheal deviation present.  Cardiovascular: Normal rate, regular rhythm and intact distal pulses.   Pulmonary/Chest: Effort normal and breath sounds normal. No stridor. No respiratory distress. She has no wheezes. She has no rales.  Abdominal: Soft. Bowel sounds are normal. She exhibits no distension. There is no tenderness. There is no rebound and no guarding.  Musculoskeletal: She exhibits no edema or tenderness.  No spinal tenderness. Full ROM. No tenderness in all 4 extremities.  Neurological: She is alert. She has normal strength. No cranial nerve deficit (no facial droop, extraocular movements intact, no slurred speech) or sensory deficit. She exhibits normal muscle tone. She displays no seizure activity. Coordination normal.  5/5 strength in BLE and BUE. Normal sensation throughout.  Skin: Skin is warm and dry. No rash noted.  Psychiatric: She has a normal mood and affect.  Nursing note and vitals reviewed.   ED Course  Procedures (including critical care time)  DIAGNOSTIC STUDIES: Oxygen Saturation is 98% on room air, normal by my interpretation.    COORDINATION OF CARE: At 1915 Discussed treatment plan with patient. Patient agrees.   Labs Review Labs Reviewed - No data to display  Imaging Review Ct Head Wo Contrast  11/28/2014   CLINICAL DATA:  Dizziness; posttraumatic headache after falling down stairs. No report of loss of consciousness.  EXAM:  CT HEAD WITHOUT CONTRAST  TECHNIQUE: Contiguous axial images were obtained from the base of the skull through the vertex without intravenous contrast.  COMPARISON:  CT scan of Feb 13, 2010.  FINDINGS: Bony calvarium appears intact. No mass effect or midline shift is noted. Ventricular size is within normal limits. There is no evidence of mass lesion, hemorrhage or acute infarction.  IMPRESSION: No acute intracranial abnormality seen.   Electronically Signed   By: Marijo Conception, M.D.   On: 11/28/2014 20:28     MDM   Final diagnoses:  Concussion, without loss of consciousness, initial encounter    No serious injury noted on CT scan.  Most likely mild concussion symptoms  associated with her fall and head injury.  Normal neuro exam.  Doubt stroke.  I personally performed the services described in this documentation, which was scribed in my presence.  The recorded information has been reviewed and is accurate.   Heather Rank, MD 11/28/14 2053

## 2015-01-02 ENCOUNTER — Ambulatory Visit (INDEPENDENT_AMBULATORY_CARE_PROVIDER_SITE_OTHER): Payer: Medicaid Other | Admitting: Internal Medicine

## 2015-03-07 ENCOUNTER — Encounter (INDEPENDENT_AMBULATORY_CARE_PROVIDER_SITE_OTHER): Payer: Self-pay | Admitting: *Deleted

## 2015-05-02 ENCOUNTER — Ambulatory Visit (INDEPENDENT_AMBULATORY_CARE_PROVIDER_SITE_OTHER): Payer: Medicaid Other | Admitting: Internal Medicine

## 2015-05-08 ENCOUNTER — Encounter (INDEPENDENT_AMBULATORY_CARE_PROVIDER_SITE_OTHER): Payer: Self-pay | Admitting: *Deleted

## 2015-05-08 ENCOUNTER — Encounter (INDEPENDENT_AMBULATORY_CARE_PROVIDER_SITE_OTHER): Payer: Self-pay | Admitting: Internal Medicine

## 2015-05-08 ENCOUNTER — Ambulatory Visit (INDEPENDENT_AMBULATORY_CARE_PROVIDER_SITE_OTHER): Payer: Medicare Other | Admitting: Internal Medicine

## 2015-05-08 VITALS — BP 128/80 | HR 84 | Temp 97.0°F | Ht 62.0 in | Wt 183.9 lb

## 2015-05-08 DIAGNOSIS — K760 Fatty (change of) liver, not elsewhere classified: Secondary | ICD-10-CM | POA: Diagnosis not present

## 2015-05-08 DIAGNOSIS — R748 Abnormal levels of other serum enzymes: Secondary | ICD-10-CM | POA: Diagnosis not present

## 2015-05-08 NOTE — Progress Notes (Addendum)
Subjective:    Patient ID: [name], female    DOB: 12-02-1960, 54 y.o.   MRN: [id number]  HPI Here today for f/u. She says she went to Dr. Ladona Horns.  She says he checked her CT scan from 2015 and she was told she had cirrhosis.  She saw him for "hernia pain".  She says today she has constant mid abdominal pain.  She has been hurting for years. Hx of hernia repair in 09/17/2011. Her appetite is okay. She has gained 11 pounds since her last visit in January. No acid reflux.  She usually has a BM every 3 days. No melena or BRRB. Last colonoscopy in 2013 by  Dr. Yaakov Guthrie and was normal. Benign polyps.   Prior work up per Dr. Laural Golden notes was negative for Hepatitis B and C. Hepatitis A antibiody positive indicating previous exposure.   Hx of fatty liver and elevated transaminases Hepatic Function Panel     Component Value Date/Time   PROT 7.8 03/18/2014 2215   ALBUMIN 3.0* 03/18/2014 2215   AST 98* 03/18/2014 2215   ALT 35 03/18/2014 2215   ALKPHOS 63 03/18/2014 2215   BILITOT 0.1* 03/18/2014 2215   BILIDIR 0.1 03/15/2014 0001   IBILI 0.1* 03/15/2014 0001     05/15/2014 CT abdomen/pelvis with CM: abdominal pain, distention, epigastric region Mild hepatic steatosis. Mild hepatomegaly stable.  08/14/2007 MRI  IMPRESSION:  1. Normal MR appearance of the pancreaticobiliary tree.  2. Mild fatty change involving the pancreas but no masses or inflammation.  3. Small peripancreatic lymph nodes of uncertain significance. However, these are unchanged since the CT scan from 2006.  4. Diffuse fatty infiltration of the liver.  5. Normal gallbladder and no gallstones.  6. Mild fatty change involving the pancreas but no pancreatic masses or inflammation.   Review of Systems Past Medical History  Diagnosis Date  . Diabetes mellitus   . GERD (gastroesophageal reflux disease)   . Anxiety disorder   . Chronic low back pain   . Fatty liver   . Hypertension   . Diabetic neuropathy    . Irritable bowel syndrome   . Diverticulitis   . Colitis   . Meniere's disease     Past Surgical History  Procedure Laterality Date  . Upper gastrointestinal endoscopy  2008  . Ectopic pregnancy surgery  1996  . Bladder tact  2005  . Partial hysterectomy  2005  . Cholecystectomy  08/11  . [place]ford  2009    Preformed on the right shoulder  . Hernia repair  09/17/11  . Colonoscopy  2208  . Colonoscopy  In of September 2014    @ [facility]/Dr,.[name]  . Ureteral stent placement    . Abdominal hysterectomy      Allergies  Allergen Reactions  . Flagyl [Metronidazole Hcl] Itching    Current Outpatient Prescriptions on File Prior to Visit  Medication Sig Dispense Refill  . fluticasone (FLONASE) 50 MCG/ACT nasal spray Place 2 sprays into both nostrils every morning.     . glyBURIDE-metformin (GLUCOVANCE) 5-500 MG per tablet Take 1 tablet by mouth 2 (two) times daily.     . metoprolol (LOPRESSOR) 50 MG tablet Take 25 mg by mouth every morning. Patient takes 1/2 tablet daily    . ondansetron (ZOFRAN ODT) 8 MG disintegrating tablet Take 1 tablet (8 mg total) by mouth every 8 (eight) hours as needed for nausea. (Patient taking differently: Take 8 mg by mouth every 8 (eight) hours as needed for  nausea. FOR NAUSEA) 20 tablet 0   No current facility-administered medications on file prior to visit.        Objective:   Physical ExamBlood pressure 128/80, pulse 84, temperature 97 F (36.1 C), height 5' 2"  (1.575 m), weight 183 lb 14.4 oz (83.416 kg).  Alert and oriented. Skin warm and dry. Oral mucosa is moist.   . Sclera anicteric, conjunctivae is pink. Thyroid not enlarged. No cervical lymphadenopathy. Lungs clear. Heart regular rate and rhythm.  Abdomen is soft. Bowel sounds are positive. No hepatomegaly. No abdominal masses felt. Slight mid abdominal tenderness (chronic).   No edema to lower extremities.         Assessment & Plan:  Hx of fatty liver. US abdomen elastrography Will get  a Hepatic function, CBC Further recommendations to follow.

## 2015-05-08 NOTE — Patient Instructions (Signed)
CBC, Hepatic Function, US abdomen elasto. OV in 3 months

## 2015-05-09 ENCOUNTER — Telehealth (INDEPENDENT_AMBULATORY_CARE_PROVIDER_SITE_OTHER): Payer: Self-pay | Admitting: Internal Medicine

## 2015-05-09 LAB — CBC WITH DIFFERENTIAL/PLATELET
BASOS PCT: 0 % (ref 0–1)
Basophils Absolute: 0 10*3/uL (ref 0.0–0.1)
EOS ABS: 0.1 10*3/uL (ref 0.0–0.7)
Eosinophils Relative: 1 % (ref 0–5)
HEMATOCRIT: 32.4 % — AB (ref 36.0–46.0)
Hemoglobin: 10.1 g/dL — ABNORMAL LOW (ref 12.0–15.0)
LYMPHS ABS: 2.8 10*3/uL (ref 0.7–4.0)
LYMPHS PCT: 25 % (ref 12–46)
MCH: 26.3 pg (ref 26.0–34.0)
MCHC: 31.2 g/dL (ref 30.0–36.0)
MCV: 84.4 fL (ref 78.0–100.0)
MONOS PCT: 5 % (ref 3–12)
MPV: 10.3 fL (ref 8.6–12.4)
Monocytes Absolute: 0.6 10*3/uL (ref 0.1–1.0)
NEUTROS PCT: 69 % (ref 43–77)
Neutro Abs: 7.7 10*3/uL (ref 1.7–7.7)
PLATELETS: 265 10*3/uL (ref 150–400)
RBC: 3.84 MIL/uL — ABNORMAL LOW (ref 3.87–5.11)
RDW: 16 % — AB (ref 11.5–15.5)
WBC: 11.2 10*3/uL — ABNORMAL HIGH (ref 4.0–10.5)

## 2015-05-09 LAB — HEPATIC FUNCTION PANEL
ALBUMIN: 3.2 g/dL — AB (ref 3.6–5.1)
ALT: 30 U/L — AB (ref 6–29)
AST: 118 U/L — ABNORMAL HIGH (ref 10–35)
Alkaline Phosphatase: 115 U/L (ref 33–130)
BILIRUBIN INDIRECT: 0.3 mg/dL (ref 0.2–1.2)
Bilirubin, Direct: 0.2 mg/dL (ref <=0.2)
TOTAL PROTEIN: 8.1 g/dL (ref 6.1–8.1)
Total Bilirubin: 0.5 mg/dL (ref 0.2–1.2)

## 2015-05-09 NOTE — Telephone Encounter (Signed)
[name], OV in 3 months

## 2015-05-10 NOTE — Telephone Encounter (Signed)
Apt has been scheduled for 08/09/15 with Deberah Castle, NP.

## 2015-05-17 ENCOUNTER — Ambulatory Visit (HOSPITAL_COMMUNITY): Admission: RE | Admit: 2015-05-17 | Payer: Medicare Other | Source: Ambulatory Visit

## 2015-05-24 ENCOUNTER — Ambulatory Visit (HOSPITAL_COMMUNITY)
Admission: RE | Admit: 2015-05-24 | Discharge: 2015-05-24 | Disposition: A | Discharge status: Home / Self Care | Payer: Medicare Other | Source: Ambulatory Visit | Attending: Internal Medicine | Admitting: Internal Medicine

## 2015-05-24 DIAGNOSIS — R748 Abnormal levels of other serum enzymes: Secondary | ICD-10-CM | POA: Insufficient documentation

## 2015-05-24 DIAGNOSIS — Z9049 Acquired absence of other specified parts of digestive tract: Secondary | ICD-10-CM | POA: Diagnosis not present

## 2015-05-24 DIAGNOSIS — K760 Fatty (change of) liver, not elsewhere classified: Secondary | ICD-10-CM | POA: Diagnosis not present

## 2015-05-24 DIAGNOSIS — R1084 Generalized abdominal pain: Secondary | ICD-10-CM | POA: Diagnosis not present

## 2015-05-24 DIAGNOSIS — R161 Splenomegaly, not elsewhere classified: Secondary | ICD-10-CM | POA: Diagnosis not present

## 2015-07-26 LAB — HEMOGLOBIN A1C: Hemoglobin A1C: 8.4

## 2015-08-09 ENCOUNTER — Ambulatory Visit (INDEPENDENT_AMBULATORY_CARE_PROVIDER_SITE_OTHER): Payer: Medicaid Other | Admitting: Internal Medicine

## 2015-08-14 ENCOUNTER — Encounter (INDEPENDENT_AMBULATORY_CARE_PROVIDER_SITE_OTHER): Payer: Self-pay | Admitting: *Deleted

## 2015-09-20 ENCOUNTER — Ambulatory Visit (INDEPENDENT_AMBULATORY_CARE_PROVIDER_SITE_OTHER): Payer: Medicare Other | Admitting: Internal Medicine

## 2015-09-26 ENCOUNTER — Encounter (INDEPENDENT_AMBULATORY_CARE_PROVIDER_SITE_OTHER): Payer: Self-pay | Admitting: *Deleted

## 2015-09-26 ENCOUNTER — Ambulatory Visit (INDEPENDENT_AMBULATORY_CARE_PROVIDER_SITE_OTHER): Payer: Medicare Other | Admitting: Internal Medicine

## 2015-10-15 DIAGNOSIS — R1031 Right lower quadrant pain: Secondary | ICD-10-CM | POA: Diagnosis not present

## 2015-10-15 DIAGNOSIS — Z883 Allergy status to other anti-infective agents status: Secondary | ICD-10-CM | POA: Diagnosis not present

## 2015-10-15 DIAGNOSIS — K219 Gastro-esophageal reflux disease without esophagitis: Secondary | ICD-10-CM | POA: Diagnosis not present

## 2015-10-15 DIAGNOSIS — Z79899 Other long term (current) drug therapy: Secondary | ICD-10-CM | POA: Diagnosis not present

## 2015-10-15 DIAGNOSIS — Z794 Long term (current) use of insulin: Secondary | ICD-10-CM | POA: Diagnosis not present

## 2015-10-15 DIAGNOSIS — K7460 Unspecified cirrhosis of liver: Secondary | ICD-10-CM | POA: Diagnosis not present

## 2015-10-15 DIAGNOSIS — Z7984 Long term (current) use of oral hypoglycemic drugs: Secondary | ICD-10-CM | POA: Diagnosis not present

## 2015-10-15 DIAGNOSIS — M1990 Unspecified osteoarthritis, unspecified site: Secondary | ICD-10-CM | POA: Diagnosis not present

## 2015-10-15 DIAGNOSIS — H8109 Meniere's disease, unspecified ear: Secondary | ICD-10-CM | POA: Diagnosis present

## 2015-10-15 DIAGNOSIS — Z809 Family history of malignant neoplasm, unspecified: Secondary | ICD-10-CM | POA: Diagnosis not present

## 2015-10-15 DIAGNOSIS — G629 Polyneuropathy, unspecified: Secondary | ICD-10-CM | POA: Diagnosis present

## 2015-10-15 DIAGNOSIS — I10 Essential (primary) hypertension: Secondary | ICD-10-CM | POA: Diagnosis not present

## 2015-10-15 DIAGNOSIS — Z888 Allergy status to other drugs, medicaments and biological substances status: Secondary | ICD-10-CM | POA: Diagnosis not present

## 2015-10-15 DIAGNOSIS — Z8249 Family history of ischemic heart disease and other diseases of the circulatory system: Secondary | ICD-10-CM | POA: Diagnosis not present

## 2015-10-15 DIAGNOSIS — E1165 Type 2 diabetes mellitus with hyperglycemia: Secondary | ICD-10-CM | POA: Diagnosis not present

## 2015-10-15 DIAGNOSIS — K529 Noninfective gastroenteritis and colitis, unspecified: Secondary | ICD-10-CM | POA: Diagnosis not present

## 2015-10-15 DIAGNOSIS — K766 Portal hypertension: Secondary | ICD-10-CM | POA: Diagnosis not present

## 2015-10-15 DIAGNOSIS — Z881 Allergy status to other antibiotic agents status: Secondary | ICD-10-CM | POA: Diagnosis not present

## 2015-10-15 DIAGNOSIS — Z833 Family history of diabetes mellitus: Secondary | ICD-10-CM | POA: Diagnosis not present

## 2015-10-15 DIAGNOSIS — E7800 Pure hypercholesterolemia, unspecified: Secondary | ICD-10-CM | POA: Diagnosis present

## 2015-10-15 DIAGNOSIS — K589 Irritable bowel syndrome without diarrhea: Secondary | ICD-10-CM | POA: Diagnosis present

## 2015-11-08 DIAGNOSIS — K529 Noninfective gastroenteritis and colitis, unspecified: Secondary | ICD-10-CM | POA: Diagnosis not present

## 2015-11-08 DIAGNOSIS — M25561 Pain in right knee: Secondary | ICD-10-CM | POA: Diagnosis not present

## 2015-11-08 DIAGNOSIS — E1142 Type 2 diabetes mellitus with diabetic polyneuropathy: Secondary | ICD-10-CM | POA: Diagnosis not present

## 2015-11-08 DIAGNOSIS — R5383 Other fatigue: Secondary | ICD-10-CM | POA: Diagnosis not present

## 2015-11-16 DIAGNOSIS — Z87442 Personal history of urinary calculi: Secondary | ICD-10-CM | POA: Diagnosis not present

## 2015-11-16 DIAGNOSIS — R1084 Generalized abdominal pain: Secondary | ICD-10-CM | POA: Diagnosis not present

## 2015-11-16 DIAGNOSIS — I10 Essential (primary) hypertension: Secondary | ICD-10-CM | POA: Diagnosis not present

## 2015-11-16 DIAGNOSIS — Z79899 Other long term (current) drug therapy: Secondary | ICD-10-CM | POA: Diagnosis not present

## 2015-11-16 DIAGNOSIS — E119 Type 2 diabetes mellitus without complications: Secondary | ICD-10-CM | POA: Diagnosis not present

## 2015-11-16 DIAGNOSIS — K219 Gastro-esophageal reflux disease without esophagitis: Secondary | ICD-10-CM | POA: Diagnosis not present

## 2015-11-16 DIAGNOSIS — Z794 Long term (current) use of insulin: Secondary | ICD-10-CM | POA: Diagnosis not present

## 2015-11-19 DIAGNOSIS — J069 Acute upper respiratory infection, unspecified: Secondary | ICD-10-CM | POA: Diagnosis not present

## 2015-11-22 DIAGNOSIS — K529 Noninfective gastroenteritis and colitis, unspecified: Secondary | ICD-10-CM | POA: Diagnosis not present

## 2015-11-22 DIAGNOSIS — J069 Acute upper respiratory infection, unspecified: Secondary | ICD-10-CM | POA: Diagnosis not present

## 2015-11-29 DIAGNOSIS — Z79899 Other long term (current) drug therapy: Secondary | ICD-10-CM | POA: Diagnosis not present

## 2015-11-29 DIAGNOSIS — K219 Gastro-esophageal reflux disease without esophagitis: Secondary | ICD-10-CM | POA: Diagnosis not present

## 2015-11-29 DIAGNOSIS — I10 Essential (primary) hypertension: Secondary | ICD-10-CM | POA: Diagnosis not present

## 2015-11-29 DIAGNOSIS — Z794 Long term (current) use of insulin: Secondary | ICD-10-CM | POA: Diagnosis not present

## 2015-11-29 DIAGNOSIS — J3489 Other specified disorders of nose and nasal sinuses: Secondary | ICD-10-CM | POA: Diagnosis not present

## 2015-11-29 DIAGNOSIS — S0083XA Contusion of other part of head, initial encounter: Secondary | ICD-10-CM | POA: Diagnosis not present

## 2015-11-29 DIAGNOSIS — E119 Type 2 diabetes mellitus without complications: Secondary | ICD-10-CM | POA: Diagnosis not present

## 2015-11-30 DIAGNOSIS — J3489 Other specified disorders of nose and nasal sinuses: Secondary | ICD-10-CM | POA: Diagnosis not present

## 2015-12-03 DIAGNOSIS — I10 Essential (primary) hypertension: Secondary | ICD-10-CM | POA: Diagnosis not present

## 2015-12-03 DIAGNOSIS — J069 Acute upper respiratory infection, unspecified: Secondary | ICD-10-CM | POA: Diagnosis not present

## 2015-12-03 DIAGNOSIS — E1165 Type 2 diabetes mellitus with hyperglycemia: Secondary | ICD-10-CM | POA: Diagnosis not present

## 2015-12-06 DIAGNOSIS — M2241 Chondromalacia patellae, right knee: Secondary | ICD-10-CM | POA: Diagnosis not present

## 2015-12-06 DIAGNOSIS — M25562 Pain in left knee: Secondary | ICD-10-CM | POA: Diagnosis not present

## 2015-12-06 DIAGNOSIS — M2242 Chondromalacia patellae, left knee: Secondary | ICD-10-CM | POA: Diagnosis not present

## 2015-12-06 DIAGNOSIS — M25561 Pain in right knee: Secondary | ICD-10-CM | POA: Diagnosis not present

## 2015-12-11 DIAGNOSIS — M6281 Muscle weakness (generalized): Secondary | ICD-10-CM | POA: Diagnosis not present

## 2015-12-11 DIAGNOSIS — M25561 Pain in right knee: Secondary | ICD-10-CM | POA: Diagnosis not present

## 2015-12-11 DIAGNOSIS — M25562 Pain in left knee: Secondary | ICD-10-CM | POA: Diagnosis not present

## 2015-12-13 DIAGNOSIS — M6281 Muscle weakness (generalized): Secondary | ICD-10-CM | POA: Diagnosis not present

## 2015-12-13 DIAGNOSIS — M25562 Pain in left knee: Secondary | ICD-10-CM | POA: Diagnosis not present

## 2015-12-13 DIAGNOSIS — M25561 Pain in right knee: Secondary | ICD-10-CM | POA: Diagnosis not present

## 2015-12-14 DIAGNOSIS — M25561 Pain in right knee: Secondary | ICD-10-CM | POA: Diagnosis not present

## 2015-12-14 DIAGNOSIS — M25562 Pain in left knee: Secondary | ICD-10-CM | POA: Diagnosis not present

## 2015-12-14 DIAGNOSIS — M6281 Muscle weakness (generalized): Secondary | ICD-10-CM | POA: Diagnosis not present

## 2015-12-16 DIAGNOSIS — Z881 Allergy status to other antibiotic agents status: Secondary | ICD-10-CM | POA: Diagnosis not present

## 2015-12-16 DIAGNOSIS — R51 Headache: Secondary | ICD-10-CM | POA: Diagnosis not present

## 2015-12-16 DIAGNOSIS — S060X9D Concussion with loss of consciousness of unspecified duration, subsequent encounter: Secondary | ICD-10-CM | POA: Diagnosis not present

## 2015-12-16 DIAGNOSIS — F0781 Postconcussional syndrome: Secondary | ICD-10-CM | POA: Diagnosis not present

## 2015-12-16 DIAGNOSIS — F17210 Nicotine dependence, cigarettes, uncomplicated: Secondary | ICD-10-CM | POA: Diagnosis not present

## 2015-12-18 DIAGNOSIS — M25561 Pain in right knee: Secondary | ICD-10-CM | POA: Diagnosis not present

## 2015-12-18 DIAGNOSIS — M25562 Pain in left knee: Secondary | ICD-10-CM | POA: Diagnosis not present

## 2015-12-18 DIAGNOSIS — M6281 Muscle weakness (generalized): Secondary | ICD-10-CM | POA: Diagnosis not present

## 2015-12-19 DIAGNOSIS — M25561 Pain in right knee: Secondary | ICD-10-CM | POA: Diagnosis not present

## 2015-12-19 DIAGNOSIS — Z299 Encounter for prophylactic measures, unspecified: Secondary | ICD-10-CM | POA: Diagnosis not present

## 2015-12-19 DIAGNOSIS — L918 Other hypertrophic disorders of the skin: Secondary | ICD-10-CM | POA: Diagnosis not present

## 2015-12-19 DIAGNOSIS — M6281 Muscle weakness (generalized): Secondary | ICD-10-CM | POA: Diagnosis not present

## 2015-12-19 DIAGNOSIS — M25562 Pain in left knee: Secondary | ICD-10-CM | POA: Diagnosis not present

## 2015-12-19 DIAGNOSIS — Z789 Other specified health status: Secondary | ICD-10-CM | POA: Diagnosis not present

## 2015-12-21 DIAGNOSIS — M25561 Pain in right knee: Secondary | ICD-10-CM | POA: Diagnosis not present

## 2015-12-21 DIAGNOSIS — M25562 Pain in left knee: Secondary | ICD-10-CM | POA: Diagnosis not present

## 2015-12-21 DIAGNOSIS — M6281 Muscle weakness (generalized): Secondary | ICD-10-CM | POA: Diagnosis not present

## 2015-12-24 DIAGNOSIS — M25562 Pain in left knee: Secondary | ICD-10-CM | POA: Diagnosis not present

## 2015-12-24 DIAGNOSIS — M25561 Pain in right knee: Secondary | ICD-10-CM | POA: Diagnosis not present

## 2015-12-24 DIAGNOSIS — M6281 Muscle weakness (generalized): Secondary | ICD-10-CM | POA: Diagnosis not present

## 2015-12-25 DIAGNOSIS — M6281 Muscle weakness (generalized): Secondary | ICD-10-CM | POA: Diagnosis not present

## 2015-12-25 DIAGNOSIS — M25562 Pain in left knee: Secondary | ICD-10-CM | POA: Diagnosis not present

## 2015-12-25 DIAGNOSIS — M25561 Pain in right knee: Secondary | ICD-10-CM | POA: Diagnosis not present

## 2015-12-28 DIAGNOSIS — M25562 Pain in left knee: Secondary | ICD-10-CM | POA: Diagnosis not present

## 2015-12-28 DIAGNOSIS — M6281 Muscle weakness (generalized): Secondary | ICD-10-CM | POA: Diagnosis not present

## 2015-12-28 DIAGNOSIS — M25561 Pain in right knee: Secondary | ICD-10-CM | POA: Diagnosis not present

## 2016-01-03 DIAGNOSIS — M25561 Pain in right knee: Secondary | ICD-10-CM | POA: Diagnosis not present

## 2016-01-03 DIAGNOSIS — M6281 Muscle weakness (generalized): Secondary | ICD-10-CM | POA: Diagnosis not present

## 2016-01-03 DIAGNOSIS — M25562 Pain in left knee: Secondary | ICD-10-CM | POA: Diagnosis not present

## 2016-01-04 DIAGNOSIS — M25561 Pain in right knee: Secondary | ICD-10-CM | POA: Diagnosis not present

## 2016-01-04 DIAGNOSIS — M25562 Pain in left knee: Secondary | ICD-10-CM | POA: Diagnosis not present

## 2016-01-04 DIAGNOSIS — M6281 Muscle weakness (generalized): Secondary | ICD-10-CM | POA: Diagnosis not present

## 2016-01-07 DIAGNOSIS — M25561 Pain in right knee: Secondary | ICD-10-CM | POA: Diagnosis not present

## 2016-01-07 DIAGNOSIS — M25562 Pain in left knee: Secondary | ICD-10-CM | POA: Diagnosis not present

## 2016-01-08 DIAGNOSIS — K219 Gastro-esophageal reflux disease without esophagitis: Secondary | ICD-10-CM | POA: Diagnosis not present

## 2016-01-08 DIAGNOSIS — M19012 Primary osteoarthritis, left shoulder: Secondary | ICD-10-CM | POA: Diagnosis not present

## 2016-01-08 DIAGNOSIS — Z794 Long term (current) use of insulin: Secondary | ICD-10-CM | POA: Diagnosis not present

## 2016-01-08 DIAGNOSIS — E119 Type 2 diabetes mellitus without complications: Secondary | ICD-10-CM | POA: Diagnosis not present

## 2016-01-08 DIAGNOSIS — Z79899 Other long term (current) drug therapy: Secondary | ICD-10-CM | POA: Diagnosis not present

## 2016-01-08 DIAGNOSIS — Z7984 Long term (current) use of oral hypoglycemic drugs: Secondary | ICD-10-CM | POA: Diagnosis not present

## 2016-01-08 DIAGNOSIS — M25512 Pain in left shoulder: Secondary | ICD-10-CM | POA: Diagnosis not present

## 2016-01-08 DIAGNOSIS — I10 Essential (primary) hypertension: Secondary | ICD-10-CM | POA: Diagnosis not present

## 2016-01-08 DIAGNOSIS — M7552 Bursitis of left shoulder: Secondary | ICD-10-CM | POA: Diagnosis not present

## 2016-01-09 DIAGNOSIS — M7550 Bursitis of unspecified shoulder: Secondary | ICD-10-CM | POA: Diagnosis not present

## 2016-01-09 DIAGNOSIS — M25562 Pain in left knee: Secondary | ICD-10-CM | POA: Diagnosis not present

## 2016-01-09 DIAGNOSIS — M25512 Pain in left shoulder: Secondary | ICD-10-CM | POA: Diagnosis not present

## 2016-01-09 DIAGNOSIS — R109 Unspecified abdominal pain: Secondary | ICD-10-CM | POA: Diagnosis not present

## 2016-01-09 DIAGNOSIS — M25561 Pain in right knee: Secondary | ICD-10-CM | POA: Diagnosis not present

## 2016-01-09 DIAGNOSIS — E669 Obesity, unspecified: Secondary | ICD-10-CM | POA: Diagnosis not present

## 2016-01-11 DIAGNOSIS — M25562 Pain in left knee: Secondary | ICD-10-CM | POA: Diagnosis not present

## 2016-01-11 DIAGNOSIS — M25561 Pain in right knee: Secondary | ICD-10-CM | POA: Diagnosis not present

## 2016-01-16 DIAGNOSIS — J069 Acute upper respiratory infection, unspecified: Secondary | ICD-10-CM | POA: Diagnosis not present

## 2016-01-25 DIAGNOSIS — M25561 Pain in right knee: Secondary | ICD-10-CM | POA: Diagnosis not present

## 2016-01-25 DIAGNOSIS — M25562 Pain in left knee: Secondary | ICD-10-CM | POA: Diagnosis not present

## 2016-01-29 DIAGNOSIS — M2241 Chondromalacia patellae, right knee: Secondary | ICD-10-CM | POA: Diagnosis not present

## 2016-01-29 DIAGNOSIS — M25562 Pain in left knee: Secondary | ICD-10-CM | POA: Diagnosis not present

## 2016-01-29 DIAGNOSIS — M7121 Synovial cyst of popliteal space [Baker], right knee: Secondary | ICD-10-CM | POA: Diagnosis not present

## 2016-01-29 DIAGNOSIS — M25561 Pain in right knee: Secondary | ICD-10-CM | POA: Diagnosis not present

## 2016-02-08 DIAGNOSIS — I10 Essential (primary) hypertension: Secondary | ICD-10-CM | POA: Diagnosis not present

## 2016-02-08 DIAGNOSIS — M159 Polyosteoarthritis, unspecified: Secondary | ICD-10-CM | POA: Diagnosis not present

## 2016-02-08 DIAGNOSIS — E119 Type 2 diabetes mellitus without complications: Secondary | ICD-10-CM | POA: Diagnosis not present

## 2016-02-13 DIAGNOSIS — E1143 Type 2 diabetes mellitus with diabetic autonomic (poly)neuropathy: Secondary | ICD-10-CM | POA: Diagnosis not present

## 2016-02-13 DIAGNOSIS — K3184 Gastroparesis: Secondary | ICD-10-CM | POA: Diagnosis not present

## 2016-02-13 LAB — HEMOGLOBIN A1C: HEMOGLOBIN A1C: 8.5

## 2016-02-18 DIAGNOSIS — M25561 Pain in right knee: Secondary | ICD-10-CM | POA: Diagnosis not present

## 2016-02-18 DIAGNOSIS — I10 Essential (primary) hypertension: Secondary | ICD-10-CM | POA: Diagnosis not present

## 2016-02-18 DIAGNOSIS — K589 Irritable bowel syndrome without diarrhea: Secondary | ICD-10-CM | POA: Diagnosis not present

## 2016-02-25 DIAGNOSIS — R5383 Other fatigue: Secondary | ICD-10-CM | POA: Diagnosis not present

## 2016-02-25 DIAGNOSIS — R110 Nausea: Secondary | ICD-10-CM | POA: Diagnosis not present

## 2016-03-03 DIAGNOSIS — M25521 Pain in right elbow: Secondary | ICD-10-CM | POA: Diagnosis not present

## 2016-03-03 DIAGNOSIS — Z794 Long term (current) use of insulin: Secondary | ICD-10-CM | POA: Diagnosis not present

## 2016-03-03 DIAGNOSIS — M25561 Pain in right knee: Secondary | ICD-10-CM | POA: Diagnosis not present

## 2016-03-03 DIAGNOSIS — I10 Essential (primary) hypertension: Secondary | ICD-10-CM | POA: Diagnosis not present

## 2016-03-03 DIAGNOSIS — W010XXA Fall on same level from slipping, tripping and stumbling without subsequent striking against object, initial encounter: Secondary | ICD-10-CM | POA: Diagnosis not present

## 2016-03-03 DIAGNOSIS — M79671 Pain in right foot: Secondary | ICD-10-CM | POA: Diagnosis not present

## 2016-03-03 DIAGNOSIS — S99921A Unspecified injury of right foot, initial encounter: Secondary | ICD-10-CM | POA: Diagnosis not present

## 2016-03-03 DIAGNOSIS — E1140 Type 2 diabetes mellitus with diabetic neuropathy, unspecified: Secondary | ICD-10-CM | POA: Diagnosis not present

## 2016-03-03 DIAGNOSIS — Z79899 Other long term (current) drug therapy: Secondary | ICD-10-CM | POA: Diagnosis not present

## 2016-03-05 DIAGNOSIS — M79671 Pain in right foot: Secondary | ICD-10-CM | POA: Diagnosis not present

## 2016-03-05 DIAGNOSIS — M25561 Pain in right knee: Secondary | ICD-10-CM | POA: Diagnosis not present

## 2016-03-14 DIAGNOSIS — E119 Type 2 diabetes mellitus without complications: Secondary | ICD-10-CM | POA: Diagnosis not present

## 2016-03-14 DIAGNOSIS — M159 Polyosteoarthritis, unspecified: Secondary | ICD-10-CM | POA: Diagnosis not present

## 2016-03-14 DIAGNOSIS — I10 Essential (primary) hypertension: Secondary | ICD-10-CM | POA: Diagnosis not present

## 2016-03-19 DIAGNOSIS — I10 Essential (primary) hypertension: Secondary | ICD-10-CM | POA: Diagnosis not present

## 2016-03-19 DIAGNOSIS — Z6834 Body mass index (BMI) 34.0-34.9, adult: Secondary | ICD-10-CM | POA: Diagnosis not present

## 2016-03-19 DIAGNOSIS — M545 Low back pain: Secondary | ICD-10-CM | POA: Diagnosis not present

## 2016-03-19 DIAGNOSIS — E1140 Type 2 diabetes mellitus with diabetic neuropathy, unspecified: Secondary | ICD-10-CM | POA: Diagnosis not present

## 2016-04-06 DIAGNOSIS — W19XXXA Unspecified fall, initial encounter: Secondary | ICD-10-CM | POA: Diagnosis not present

## 2016-04-06 DIAGNOSIS — Z79899 Other long term (current) drug therapy: Secondary | ICD-10-CM | POA: Diagnosis not present

## 2016-04-06 DIAGNOSIS — E119 Type 2 diabetes mellitus without complications: Secondary | ICD-10-CM | POA: Diagnosis not present

## 2016-04-06 DIAGNOSIS — K219 Gastro-esophageal reflux disease without esophagitis: Secondary | ICD-10-CM | POA: Diagnosis not present

## 2016-04-06 DIAGNOSIS — M1990 Unspecified osteoarthritis, unspecified site: Secondary | ICD-10-CM | POA: Diagnosis not present

## 2016-04-06 DIAGNOSIS — M545 Low back pain: Secondary | ICD-10-CM | POA: Diagnosis not present

## 2016-04-06 DIAGNOSIS — E669 Obesity, unspecified: Secondary | ICD-10-CM | POA: Diagnosis not present

## 2016-04-06 DIAGNOSIS — Z794 Long term (current) use of insulin: Secondary | ICD-10-CM | POA: Diagnosis not present

## 2016-04-06 DIAGNOSIS — I10 Essential (primary) hypertension: Secondary | ICD-10-CM | POA: Diagnosis not present

## 2016-04-06 DIAGNOSIS — S3992XA Unspecified injury of lower back, initial encounter: Secondary | ICD-10-CM | POA: Diagnosis not present

## 2016-04-06 DIAGNOSIS — S335XXA Sprain of ligaments of lumbar spine, initial encounter: Secondary | ICD-10-CM | POA: Diagnosis not present

## 2016-04-11 DIAGNOSIS — Z299 Encounter for prophylactic measures, unspecified: Secondary | ICD-10-CM | POA: Diagnosis not present

## 2016-04-11 DIAGNOSIS — M545 Low back pain: Secondary | ICD-10-CM | POA: Diagnosis not present

## 2016-04-11 DIAGNOSIS — H00019 Hordeolum externum unspecified eye, unspecified eyelid: Secondary | ICD-10-CM | POA: Diagnosis not present

## 2016-04-29 ENCOUNTER — Encounter: Payer: Self-pay | Admitting: "Endocrinology

## 2016-04-29 ENCOUNTER — Ambulatory Visit (INDEPENDENT_AMBULATORY_CARE_PROVIDER_SITE_OTHER): Payer: Medicare Other | Admitting: "Endocrinology

## 2016-04-29 VITALS — BP 136/82 | Resp 18 | Ht 61.5 in | Wt 200.0 lb

## 2016-04-29 DIAGNOSIS — E6609 Other obesity due to excess calories: Secondary | ICD-10-CM | POA: Insufficient documentation

## 2016-04-29 DIAGNOSIS — Z794 Long term (current) use of insulin: Secondary | ICD-10-CM | POA: Diagnosis not present

## 2016-04-29 DIAGNOSIS — I10 Essential (primary) hypertension: Secondary | ICD-10-CM | POA: Diagnosis not present

## 2016-04-29 DIAGNOSIS — E1165 Type 2 diabetes mellitus with hyperglycemia: Secondary | ICD-10-CM

## 2016-04-29 DIAGNOSIS — E118 Type 2 diabetes mellitus with unspecified complications: Secondary | ICD-10-CM

## 2016-04-29 DIAGNOSIS — Z6833 Body mass index (BMI) 33.0-33.9, adult: Secondary | ICD-10-CM

## 2016-04-29 DIAGNOSIS — IMO0002 Reserved for concepts with insufficient information to code with codable children: Secondary | ICD-10-CM

## 2016-04-29 MED ORDER — METFORMIN HCL 500 MG PO TABS: 500.0000 mg | ORAL_TABLET | Freq: Two times a day (BID) | ORAL | 3 refills | Status: DC

## 2016-04-29 NOTE — Progress Notes (Signed)
Subjective:    Patient ID: [name], female    DOB: 1961-06-04. Patient is being seen in consultation for management of diabetes requested by  Glenda Chroman, MD  Past Medical History:  Diagnosis Date  . Anxiety disorder   . Chronic low back pain   . Colitis   . Diabetes mellitus   . Diabetic neuropathy ([place])   . Diverticulitis   . Fatty liver   . GERD (gastroesophageal reflux disease)   . Hypertension   . Irritable bowel syndrome   . Meniere's disease    Past Surgical History:  Procedure Laterality Date  . ABDOMINAL HYSTERECTOMY    . bladder tact  2005  . CHOLECYSTECTOMY  08/11  . COLONOSCOPY  2208  . COLONOSCOPY  In of September 2014   @ [facility]/Dr,.[name]  . [place]  . HERNIA REPAIR  09/17/11  . mumford  2009   Preformed on the right shoulder  . PARTIAL HYSTERECTOMY  2005  . UPPER GASTROINTESTINAL ENDOSCOPY  2008  . URETERAL STENT PLACEMENT     Social History   Social History  . Marital status: Divorced    Spouse name: N/A  . Number of children: N/A  . Years of education: N/A   Social History Main Topics  . Smoking status: Never Smoker  . Smokeless tobacco: Never Used  . Alcohol use No  . Drug use: No  . Sexual activity: Yes    Birth control/ protection: Surgical   Other Topics Concern  . Not on file   Social History Narrative  . No narrative on file   Outpatient Encounter Prescriptions as of 04/29/2016  Medication Sig  . acetaZOLAMIDE (DIAMOX) 250 MG tablet Take 250 mg by mouth once.  . cyanocobalamin (,VITAMIN B-12,) 1000 MCG/ML injection Inject 1,000 mcg into the muscle every 30 (thirty) days.  . diazepam (VALIUM) 5 MG tablet Take 2.5 mg by mouth as needed.  . dicyclomine (BENTYL) 10 MG capsule Take 10 mg by mouth 2 (two) times daily as needed.  . DULoxetine (CYMBALTA) 60 MG capsule Take 60 mg by mouth daily.  Marland Kitchen estradiol (ESTRACE) 0.5 MG tablet Take 0.5 mg by mouth daily.  . fluticasone (FLONASE) 50 MCG/ACT nasal spray  Place 2 sprays into both nostrils every morning.   Marland Kitchen ibuprofen (ADVIL,MOTRIN) 600 MG tablet Take 600 mg by mouth as needed.  . Insulin Aspart (NOVOLOG [place]) Inject 10-16 Units into the skin 3 (three) times daily with meals. 10unit each meal   . Insulin Degludec (TRESIBA FLEXTOUCH) 100 UNIT/ML SOPN Inject 60 Units into the skin at bedtime.  . Meclizine HCl 25 MG CHEW Chew 25 mg by mouth.  . metoprolol (LOPRESSOR) 50 MG tablet Take 25 mg by mouth every morning. Patient takes 1/2 tablet daily  . ondansetron (ZOFRAN ODT) 8 MG disintegrating tablet Take 1 tablet (8 mg total) by mouth every 8 (eight) hours as needed for nausea. (Patient taking differently: Take 8 mg by mouth every 8 (eight) hours as needed for nausea. FOR NAUSEA)  . oxazepam (SERAX) 10 MG capsule Take 10 mg by mouth at bedtime.  . pantoprazole (PROTONIX) 40 MG tablet Take 40 mg by mouth once.  . promethazine (PHENERGAN) 25 MG tablet Take 25 mg by mouth daily as needed.  . traMADol (ULTRAM) 50 MG tablet Take 50 mg by mouth 3 (three) times daily as needed.  . [DISCONTINUED] glyBURIDE-metformin (GLUCOVANCE) 5-500 MG per tablet Take 1 tablet by mouth 2 (two)  times daily.   . [DISCONTINUED] pioglitazone (ACTOS) 15 MG tablet Take 15 mg by mouth daily.  . metFORMIN (GLUCOPHAGE) 500 MG tablet Take 1 tablet (500 mg total) by mouth 2 (two) times daily with a meal.   No facility-administered encounter medications on file as of 04/29/2016.    ALLERGIES: Allergies  Allergen Reactions  . Flagyl [Metronidazole Hcl] Itching   VACCINATION STATUS:  There is no immunization history on file for this patient.  Diabetes  She presents for her initial diabetic visit. She has type 2 diabetes mellitus. Onset time: She was diagnosed approximately at age 31 years. Her disease course has been worsening. There are no hypoglycemic associated symptoms. Pertinent negatives for hypoglycemia include no confusion, headaches, pallor or seizures. Associated symptoms  include fatigue, polydipsia and polyuria. Pertinent negatives for diabetes include no chest pain and no polyphagia. There are no hypoglycemic complications. Symptoms are worsening. There are no diabetic complications. Risk factors for coronary artery disease include diabetes mellitus, dyslipidemia, hypertension, obesity, sedentary lifestyle and family history. Current diabetic treatment includes insulin injections and oral agent (dual therapy) (She is on Tresiba 130 units every morning, NovoLog 15 units 3 times a day before meals, Glucovance 5/500 3 times a day, Actos 15 mg by mouth daily.). Her weight is increasing steadily. She is following a generally unhealthy diet. When asked about meal planning, she reported none. She has not had a previous visit with a dietitian. She never participates in exercise. Home blood sugar record trend: She did not bring her meter nor logs to review today. She admits she is not monitoring regularly. An ACE inhibitor/angiotensin II receptor blocker is not being taken. Eye exam is current.  Hypertension  This is a chronic problem. The current episode started more than 1 year ago. The problem is controlled. Pertinent negatives include no chest pain, headaches, palpitations or shortness of breath. Risk factors for coronary artery disease include diabetes mellitus, obesity and sedentary lifestyle. Past treatments include beta blockers. Compliance problems include diet and exercise.     .   Review of Systems  Constitutional: Positive for fatigue. Negative for unexpected weight change.  HENT: Negative for trouble swallowing and voice change.   Eyes: Negative for visual disturbance.  Respiratory: Negative for cough, shortness of breath and wheezing.   Cardiovascular: Negative for chest pain, palpitations and leg swelling.  Gastrointestinal: Negative for diarrhea, nausea and vomiting.  Endocrine: Positive for polydipsia and polyuria. Negative for cold intolerance, heat  intolerance and polyphagia.  Genitourinary: Positive for frequency. Negative for dysuria and flank pain.  Musculoskeletal: Negative for arthralgias and myalgias.  Skin: Negative for color change, pallor, rash and wound.  Neurological: Negative for seizures and headaches.  Psychiatric/Behavioral: Negative for confusion and suicidal ideas.    Objective:    BP 136/82 (BP Location: Left Arm, Patient Position: Sitting, Cuff Size: Large)   Resp 18   Ht 5' 1.5" (1.562 m)   Wt 200 lb (90.7 kg)   BMI 37.18 kg/m   Wt Readings from Last 3 Encounters:  04/29/16 200 lb (90.7 kg)  05/08/15 183 lb 14.4 oz (83.4 kg)  11/28/14 178 lb (80.7 kg)    Physical Exam  Constitutional: She is oriented to person, place, and time. She appears well-developed.  Obese.  HENT:  Head: Normocephalic and atraumatic.  Eyes: EOM are normal.  Neck: Normal range of motion. Neck supple. No tracheal deviation present. No thyromegaly present.  Cardiovascular: Normal rate and regular rhythm.   Pulmonary/Chest: Effort normal and  breath sounds normal.  Abdominal: Soft. Bowel sounds are normal. There is no tenderness. There is no guarding.  Musculoskeletal: Normal range of motion. She exhibits no edema.  Neurological: She is alert and oriented to person, place, and time. She has normal reflexes. No cranial nerve deficit. Coordination normal.  Skin: Skin is warm and dry. No rash noted. No erythema. No pallor.  Psychiatric: She has a normal mood and affect. Judgment normal.     CMP ( most recent) CMP     Component Value Date/Time   NA 137 03/18/2014 2215   K 4.0 03/18/2014 2215   CL 101 03/18/2014 2215   CO2 22 03/18/2014 2215   GLUCOSE 261 (H) 03/18/2014 2215   BUN 15 03/18/2014 2215   CREATININE 0.71 03/18/2014 2215   CALCIUM 8.6 03/18/2014 2215   PROT 8.1 05/08/2015 1543   ALBUMIN 3.2 (L) 05/08/2015 1543   AST 118 (H) 05/08/2015 1543   ALT 30 (H) 05/08/2015 1543   ALKPHOS 115 05/08/2015 1543   BILITOT 0.5  05/08/2015 1543   GFRNONAA >90 03/18/2014 2215   GFRAA >90 03/18/2014 2215     Diabetic Labs (most recent): Lab Results  Component Value Date   HGBA1C 8.5 02/13/2016   HGBA1C 8.4 07/26/2015      Assessment & Plan:   1. Uncontrolled type 2 diabetes mellitus with complication, with long-term current use of insulin ([place]) - Patient has currently uncontrolled symptomatic type 2 DM since  55 years of age,  with most recent A1c of 8.7 %. Recent labs reviewed.   Her diabetes is complicated by obesity, insulin resistance, and patient remains at a high risk for more acute and chronic complications of diabetes which include CAD, CVA, CKD, retinopathy, and neuropathy. These are all discussed in detail with the patient.  - I have counseled the patient on diet management and weight loss, by adopting a carbohydrate restricted/protein rich diet.  - Suggestion is made for patient to avoid simple carbohydrates   from their diet including Cakes , Desserts, Ice Cream,  Soda (  diet and regular) , Sweet Tea , Candies,  Chips, Cookies, Artificial Sweeteners,   and "Sugar-free" Products . This will help patient to have stable blood glucose profile and potentially avoid unintended weight gain.  - I encouraged the patient to switch to  unprocessed or minimally processed complex starch and increased protein intake (animal or plant source), fruits, and vegetables.  - Patient is advised to stick to a routine mealtimes to eat 3 meals  a day and avoid unnecessary snacks ( to snack only to correct hypoglycemia).  - The patient will be scheduled with Jearld Fenton, RDN, CDE for individualized DM education.  - I have approached patient with the following individualized plan to manage diabetes and patient agrees:   - It appears that she has huge in insulin resistance however her current dose of insulin is too high to continue without properly documented blood glucose profile.  - I  will proceed to adjust her basal  insulinTresiba to 60 units QHS, and prandial insulin log to 10  units TIDAC for pre-meal BG readings of 90-162m/dl, plus patient specific correction dose for unexpected hyperglycemia above 1569mdl, associated with strict monitoring of glucose  AC and HS. - Patient is warned not to take insulin without proper monitoring per orders. -Adjustment parameters are given for hypo and hyperglycemia in writing. -Patient is encouraged to call clinic for blood glucose levels less than 70 or above 300 mg /dl. -  I will  start metformin 500 mg by mouth twice a day ,therapeutically suitable for patient.. - I will discontinue Glucovance and pioglitazone, risk outweighs benefit for this patient. -  She has history of intolerance for [place], likely would not tolerate other  incretin therapy.  - Patient specific target  A1c;  LDL, HDL, Triglycerides, and  Waist Circumference were discussed in detail.  2) BP/HTN:  controlled. Continue current medications . 3) Lipids/HPL:   Lipid panel unknown, she is not statins. 4)  Weight/Diet: CDE Consult will be initiated , exercise, and detailed carbohydrates information provided.  5) Chronic Care/Health Maintenance:  -Patient  will be considered for ACE inhibitor's and statins as appropriate, and encouraged to continue to follow up with Ophthalmology, Podiatrist at least yearly or according to recommendations, and advised to   stay away from smoking. I have recommended yearly flu vaccine and pneumonia vaccination at least every 5 years; moderate intensity exercise for up to 150 minutes weekly; and  sleep for at least 7 hours a day.  - 60 minutes of time was spent on the care of this patient , 50% of which was applied for counseling on diabetes complications and their preventions.  - Patient to bring meter and  blood glucose logs during their next visit.   - I advised patient to maintain close follow up with Glenda Chroman, MD for primary care needs.  Follow up plan: -  Return in about 1 week (around 05/06/2016) for follow up with meter and logs- no labs.  Glade Lloyd, MD Phone: [phone]  Fax: [phone]   04/29/2016, 9:37 AM

## 2016-04-30 DIAGNOSIS — M159 Polyosteoarthritis, unspecified: Secondary | ICD-10-CM | POA: Diagnosis not present

## 2016-04-30 DIAGNOSIS — I10 Essential (primary) hypertension: Secondary | ICD-10-CM | POA: Diagnosis not present

## 2016-04-30 DIAGNOSIS — E119 Type 2 diabetes mellitus without complications: Secondary | ICD-10-CM | POA: Diagnosis not present

## 2016-05-06 ENCOUNTER — Ambulatory Visit: Payer: Medicare Other | Admitting: "Endocrinology

## 2016-05-13 ENCOUNTER — Encounter: Payer: Self-pay | Admitting: "Endocrinology

## 2016-05-14 ENCOUNTER — Encounter: Payer: Medicare Other | Attending: "Endocrinology | Admitting: Nutrition

## 2016-05-14 ENCOUNTER — Ambulatory Visit (INDEPENDENT_AMBULATORY_CARE_PROVIDER_SITE_OTHER): Payer: Medicare Other | Admitting: "Endocrinology

## 2016-05-14 ENCOUNTER — Encounter: Payer: Self-pay | Admitting: "Endocrinology

## 2016-05-14 ENCOUNTER — Encounter: Payer: Self-pay | Admitting: Nutrition

## 2016-05-14 VITALS — Ht 61.5 in | Wt 194.0 lb

## 2016-05-14 VITALS — BP 126/84 | HR 106 | Ht 61.5 in | Wt 194.0 lb

## 2016-05-14 DIAGNOSIS — Z794 Long term (current) use of insulin: Secondary | ICD-10-CM

## 2016-05-14 DIAGNOSIS — E119 Type 2 diabetes mellitus without complications: Secondary | ICD-10-CM | POA: Insufficient documentation

## 2016-05-14 DIAGNOSIS — Z6836 Body mass index (BMI) 36.0-36.9, adult: Secondary | ICD-10-CM | POA: Diagnosis not present

## 2016-05-14 DIAGNOSIS — I10 Essential (primary) hypertension: Secondary | ICD-10-CM | POA: Diagnosis not present

## 2016-05-14 DIAGNOSIS — E118 Type 2 diabetes mellitus with unspecified complications: Secondary | ICD-10-CM

## 2016-05-14 DIAGNOSIS — Z713 Dietary counseling and surveillance: Secondary | ICD-10-CM | POA: Insufficient documentation

## 2016-05-14 DIAGNOSIS — IMO0002 Reserved for concepts with insufficient information to code with codable children: Secondary | ICD-10-CM

## 2016-05-14 DIAGNOSIS — E1165 Type 2 diabetes mellitus with hyperglycemia: Secondary | ICD-10-CM

## 2016-05-14 NOTE — Patient Instructions (Signed)
Goals 1. Follow My Plate 2. Increase fresh fruits and vegetables 3. Drink only water 4. Do not skip meals. 5. Eat meals on time. 6. No snacks between meals 7. Walk 30 minutes per day 8. Get A1C to

## 2016-05-14 NOTE — Progress Notes (Signed)
Diabetes Self-Management Education  Visit Type: First/Initial  Appt. Start Time: 1015ppt. End Time: 1030  05/14/2016  Ms. Desiree Lucy, identified by name and date of birth, is a 55 y.o. female with a diagnosis of Diabetes: Type 2.   ASSESSMENT  Height 5' 1.5" (1.562 m), weight 194 lb (88 kg). Body mass index is 36.06 kg/m.      Diabetes Self-Management Education - 05/14/16 1250      Visit Information   Visit Type First/Initial     Initial Visit   Diabetes Type Type 2   Are you currently following a meal plan? Yes   What type of meal plan do you follow? low carb   Are you taking your medications as prescribed? Yes   Date Diagnosed 2000     Health Coping   How would you rate your overall health? Fair     Psychosocial Assessment   Patient Belief/Attitude about Diabetes Motivated to manage diabetes   Self-care barriers None   Self-management support Friends;Family   Other persons present Patient   Patient Concerns Nutrition/Meal planning;Medication;Monitoring;Healthy Lifestyle;Glycemic Control;Problem Solving;Weight Control   Special Needs None   Preferred Learning Style No preference indicated   Learning Readiness Ready   How often do you need to have someone help you when you read instructions, pamphlets, or other written materials from your doctor or pharmacy? 1 - Never   What is the last grade level you completed in school? 12     Pre-Education Assessment   Patient understands the diabetes disease and treatment process. Needs Instruction   Patient understands incorporating nutritional management into lifestyle. Needs Instruction   Patient undertands incorporating physical activity into lifestyle. Needs Instruction   Patient understands using medications safely. Needs Instruction   Patient understands monitoring blood glucose, interpreting and using results Needs Instruction   Patient understands prevention, detection, and treatment of acute complications. Needs  Instruction   Patient understands prevention, detection, and treatment of chronic complications. Needs Instruction   Patient understands how to develop strategies to address psychosocial issues. Needs Instruction   Patient understands how to develop strategies to promote health/change behavior. Needs Instruction     Complications   Last HgB A1C per patient/outside source 8.9 %   How often do you check your blood sugar? 3-4 times/day   Fasting Blood glucose range (mg/dL) 180-200;130-179   Number of hypoglycemic episodes per month 0   Number of hyperglycemic episodes per week 10   Can you tell when your blood sugar is high? Yes  tired and thirsty   What do you do if your blood sugar is high? take meds, water   Have you had a dilated eye exam in the past 12 months? No   Have you had a dental exam in the past 12 months? No   Are you checking your feet? No     Dietary Intake   Breakfast Cereal and milk   Lunch skipped or meat, fruit   Dinner meat, vegetables, water or crystal light   Snack (evening) craves sweets; pb crackers   Beverage(s) water, crystal light,     Exercise   How many days per week to you exercise? 0     Patient Education   Previous Diabetes Education No   Disease state  Definition of diabetes, type 1 and 2, and the diagnosis of diabetes;Explored patient's options for treatment of their diabetes;Factors that contribute to the development of diabetes   Nutrition management  Role of diet in the treatment  of diabetes and the relationship between the three main macronutrients and blood glucose level;Carbohydrate counting;Information on hints to eating out and maintain blood glucose control.   Physical activity and exercise  Role of exercise on diabetes management, blood pressure control and cardiac health.;Identified with patient nutritional and/or medication changes necessary with exercise.;Helped patient identify appropriate exercises in relation to his/her diabetes,  diabetes complications and other health issue.   Medications Taught/reviewed insulin injection, site rotation, insulin storage and needle disposal.;Reviewed patients medication for diabetes, action, purpose, timing of dose and side effects.;Reviewed medication adjustment guidelines for hyperglycemia and sick days.   Monitoring Purpose and frequency of SMBG.   Acute complications Taught treatment of hypoglycemia - the 15 rule.;Discussed and identified patients' treatment of hyperglycemia.   Chronic complications Relationship between chronic complications and blood glucose control;Identified and discussed with patient  current chronic complications;Nephropathy, what it is, prevention of, the use of ACE, ARB's and early detection of through urine microalbumia.;Reviewed with patient heart disease, higher risk of, and prevention   Psychosocial adjustment Worked with patient to identify barriers to care and solutions;Role of stress on diabetes   Personal strategies to promote health Lifestyle issues that need to be addressed for better diabetes care;Review risk of smoking and offered smoking cessation     Individualized Goals (developed by patient)   Nutrition Follow meal plan discussed;General guidelines for healthy choices and portions discussed;Adjust meds/carbs with exercise as discussed   Physical Activity Exercise 3-5 times per week;15 minutes per day   Medications take my medication as prescribed   Monitoring  test my blood glucose as discussed;send in my blood glucose log as discussed;test blood glucose pre and post meals as discussed   Problem Solving Don't skip meals   Reducing Risk examine blood glucose patterns;get labs drawn;treat hypoglycemia with 15 grams of carbs if blood glucose less than 68m/dL;increase portions of nuts and seeds;increase portions of olive oil in diet;increase portions of healthy fats   Health Coping --  CHo counting or meal planning     Post-Education Assessment    Patient understands the diabetes disease and treatment process. Needs Review   Patient understands incorporating nutritional management into lifestyle. Needs Review   Patient undertands incorporating physical activity into lifestyle. Needs Review   Patient understands using medications safely. Needs Review   Patient understands monitoring blood glucose, interpreting and using results Needs Review   Patient understands prevention, detection, and treatment of acute complications. Needs Review   Patient understands prevention, detection, and treatment of chronic complications. Needs Review   Patient understands how to develop strategies to address psychosocial issues. Needs Review   Patient understands how to develop strategies to promote health/change behavior. Needs Review     Outcomes   Expected Outcomes Demonstrated interest in learning. Expect positive outcomes   Future DMSE 4-6 wks   Program Status Completed      Individualized Plan for Diabetes Self-Management Training:   Learning Objective:  Patient will have a greater understanding of diabetes self-management. Patient education plan is to attend individual and/or group sessions per assessed needs and concerns.   Plan:   Patient Instructions  Goals 1. Follow My Plate 2. Increase fresh fruits and vegetables 3. Drink only water 4. Do not skip meals. 5. Eat meals on time. 6. No snacks between meals 7. Walk 30 minutes per day 8. Get A1C to   Expected Outcomes:  Demonstrated interest in learning. Expect positive outcomes  Education material provided: Living Well with Diabetes, Meal plan card,  My Plate and Carbohydrate counting sheet  If problems or questions, patient to contact team via:  Phone and Email  Future DSME appointment: 4-6 wks

## 2016-05-14 NOTE — Patient Instructions (Signed)

## 2016-05-14 NOTE — Progress Notes (Signed)
Subjective:    Patient ID: [name], female    DOB: Sep 16, 1961. Patient is being seen in consultation for management of diabetes requested by  Glenda Chroman, MD  Past Medical History:  Diagnosis Date  . Anxiety disorder   . Chronic low back pain   . Colitis   . Diabetes mellitus   . Diabetic neuropathy ([place])   . Diverticulitis   . Fatty liver   . GERD (gastroesophageal reflux disease)   . Hypertension   . Irritable bowel syndrome   . Meniere's disease    Past Surgical History:  Procedure Laterality Date  . ABDOMINAL HYSTERECTOMY    . bladder tact  2005  . CHOLECYSTECTOMY  08/11  . COLONOSCOPY  2208  . COLONOSCOPY  In of September 2014   @ [facility]/Dr,.[name]  . [facility]  . HERNIA REPAIR  09/17/11  . [place]  2009   Preformed on the right shoulder  . PARTIAL HYSTERECTOMY  2005  . UPPER GASTROINTESTINAL ENDOSCOPY  2008  . URETERAL STENT PLACEMENT     Social History   Social History  . Marital status: Divorced    Spouse name: N/A  . Number of children: N/A  . Years of education: N/A   Social History Main Topics  . Smoking status: Never Smoker  . Smokeless tobacco: Never Used  . Alcohol use No  . Drug use: No  . Sexual activity: Yes    Birth control/ protection: Surgical   Other Topics Concern  . None   Social History Narrative  . None   Outpatient Encounter Prescriptions as of 05/14/2016  Medication Sig  . acetaZOLAMIDE (DIAMOX) 250 MG tablet Take 250 mg by mouth once.  . cyanocobalamin (,VITAMIN B-12,) 1000 MCG/ML injection Inject 1,000 mcg into the muscle every 30 (thirty) days.  . diazepam (VALIUM) 5 MG tablet Take 2.5 mg by mouth as needed.  . dicyclomine (BENTYL) 10 MG capsule Take 10 mg by mouth 2 (two) times daily as needed.  . DULoxetine (CYMBALTA) 60 MG capsule Take 60 mg by mouth daily.  Marland Kitchen estradiol (ESTRACE) 0.5 MG tablet Take 0.5 mg by mouth daily.  . fluticasone (FLONASE) 50 MCG/ACT nasal spray Place 2 sprays into both  nostrils every morning.   Marland Kitchen ibuprofen (ADVIL,MOTRIN) 600 MG tablet Take 600 mg by mouth as needed.  . Insulin Aspart (NOVOLOG [place]) Inject 5-11 Units into the skin 3 (three) times daily with meals. 10unit each meal   . Insulin Degludec (TRESIBA FLEXTOUCH) 100 UNIT/ML SOPN Inject 50 Units into the skin at bedtime.  . Meclizine HCl 25 MG CHEW Chew 25 mg by mouth.  . metFORMIN (GLUCOPHAGE) 500 MG tablet Take 1 tablet (500 mg total) by mouth 2 (two) times daily with a meal.  . metoprolol (LOPRESSOR) 50 MG tablet Take 25 mg by mouth every morning. Patient takes 1/2 tablet daily  . ondansetron (ZOFRAN ODT) 8 MG disintegrating tablet Take 1 tablet (8 mg total) by mouth every 8 (eight) hours as needed for nausea. (Patient taking differently: Take 8 mg by mouth every 8 (eight) hours as needed for nausea. FOR NAUSEA)  . oxazepam (SERAX) 10 MG capsule Take 10 mg by mouth at bedtime.  . pantoprazole (PROTONIX) 40 MG tablet Take 40 mg by mouth once.  . promethazine (PHENERGAN) 25 MG tablet Take 25 mg by mouth daily as needed.  . traMADol (ULTRAM) 50 MG tablet Take 50 mg by mouth 3 (three) times daily as  needed.   No facility-administered encounter medications on file as of 05/14/2016.    ALLERGIES: Allergies  Allergen Reactions  . Flagyl [Metronidazole Hcl] Itching   VACCINATION STATUS:  There is no immunization history on file for this patient.  Diabetes  She presents for her follow-up diabetic visit. She has type 2 diabetes mellitus. Onset time: She was diagnosed approximately at age 33 years. Her disease course has been improving. There are no hypoglycemic associated symptoms. Pertinent negatives for hypoglycemia include no confusion, headaches, pallor or seizures. Associated symptoms include fatigue, polydipsia and polyuria. Pertinent negatives for diabetes include no chest pain and no polyphagia. There are no hypoglycemic complications. Symptoms are improving. There are no diabetic complications. Risk  factors for coronary artery disease include diabetes mellitus, dyslipidemia, hypertension, obesity, sedentary lifestyle and family history. Current diabetic treatment includes oral agent (monotherapy) and intensive insulin program (She is on Tresiba 130 units every morning, NovoLog 15 units 3 times a day before meals, Glucovance 5/500 3 times a day, Actos 15 mg by mouth daily.). Her weight is decreasing steadily. She is following a generally unhealthy diet. When asked about meal planning, she reported none. She has not had a previous visit with a dietitian. She never participates in exercise. Her breakfast blood glucose range is generally 140-180 mg/dl. Her lunch blood glucose range is generally 140-180 mg/dl. Her dinner blood glucose range is generally 140-180 mg/dl. Her overall blood glucose range is 140-180 mg/dl. An ACE inhibitor/angiotensin II receptor blocker is not being taken. Eye exam is current.  Hypertension  This is a chronic problem. The current episode started more than 1 year ago. The problem is controlled. Pertinent negatives include no chest pain, headaches, palpitations or shortness of breath. Risk factors for coronary artery disease include diabetes mellitus, obesity and sedentary lifestyle. Past treatments include beta blockers. Compliance problems include diet and exercise.      Review of Systems  Constitutional: Positive for fatigue. Negative for unexpected weight change.  HENT: Negative for trouble swallowing and voice change.   Eyes: Negative for visual disturbance.  Respiratory: Negative for cough, shortness of breath and wheezing.   Cardiovascular: Negative for chest pain, palpitations and leg swelling.  Gastrointestinal: Negative for diarrhea, nausea and vomiting.  Endocrine: Positive for polydipsia and polyuria. Negative for cold intolerance, heat intolerance and polyphagia.  Genitourinary: Positive for frequency. Negative for dysuria and flank pain.  Musculoskeletal:  Negative for arthralgias and myalgias.  Skin: Negative for color change, pallor, rash and wound.  Neurological: Negative for seizures and headaches.  Psychiatric/Behavioral: Negative for confusion and suicidal ideas.    Objective:    BP 126/84   Pulse (!) 106   Ht 5' 1.5" (1.562 m)   Wt 194 lb (88 kg)   BMI 36.06 kg/m   Wt Readings from Last 3 Encounters:  05/14/16 194 lb (88 kg)  04/29/16 200 lb (90.7 kg)  05/08/15 183 lb 14.4 oz (83.4 kg)    Physical Exam  Constitutional: She is oriented to person, place, and time. She appears well-developed.  Obese.  HENT:  Head: Normocephalic and atraumatic.  Eyes: EOM are normal.  Neck: Normal range of motion. Neck supple. No tracheal deviation present. No thyromegaly present.  Cardiovascular: Normal rate and regular rhythm.   Pulmonary/Chest: Effort normal and breath sounds normal.  Abdominal: Soft. Bowel sounds are normal. There is no tenderness. There is no guarding.  Musculoskeletal: Normal range of motion. She exhibits no edema.  Neurological: She is alert and oriented to person,  place, and time. She has normal reflexes. No cranial nerve deficit. Coordination normal.  Skin: Skin is warm and dry. No rash noted. No erythema. No pallor.  Psychiatric: She has a normal mood and affect. Judgment normal.     CMP     Component Value Date/Time   NA 137 03/18/2014 2215   K 4.0 03/18/2014 2215   CL 101 03/18/2014 2215   CO2 22 03/18/2014 2215   GLUCOSE 261 (H) 03/18/2014 2215   BUN 15 03/18/2014 2215   CREATININE 0.71 03/18/2014 2215   CALCIUM 8.6 03/18/2014 2215   PROT 8.1 05/08/2015 1543   ALBUMIN 3.2 (L) 05/08/2015 1543   AST 118 (H) 05/08/2015 1543   ALT 30 (H) 05/08/2015 1543   ALKPHOS 115 05/08/2015 1543   BILITOT 0.5 05/08/2015 1543   GFRNONAA >90 03/18/2014 2215   GFRAA >90 03/18/2014 2215     Diabetic Labs (most recent): Lab Results  Component Value Date   HGBA1C 8.5 02/13/2016   HGBA1C 8.4 07/26/2015      Assessment & Plan:   1. Uncontrolled type 2 diabetes mellitus with complication, with long-term current use of insulin ([place]) - Patient has currently uncontrolled symptomatic type 2 DM since  55 years of age,  with most recent A1c of 8.7 %. Recent labs reviewed.   Her diabetes is complicated by obesity, insulin resistance, and patient remains at a high risk for more acute and chronic complications of diabetes which include CAD, CVA, CKD, retinopathy, and neuropathy. These are all discussed in detail with the patient.  - I have counseled the patient on diet management and weight loss, by adopting a carbohydrate restricted/protein rich diet.  - Suggestion is made for patient to avoid simple carbohydrates   from their diet including Cakes , Desserts, Ice Cream,  Soda (  diet and regular) , Sweet Tea , Candies,  Chips, Cookies, Artificial Sweeteners,   and "Sugar-free" Products . This will help patient to have stable blood glucose profile and potentially avoid unintended weight gain.  - I encouraged the patient to switch to  unprocessed or minimally processed complex starch and increased protein intake (animal or plant source), fruits, and vegetables.  - Patient is advised to stick to a routine mealtimes to eat 3 meals  a day and avoid unnecessary snacks ( to snack only to correct hypoglycemia).  - The patient will be scheduled with Jearld Fenton, RDN, CDE for individualized DM education.  - I have approached patient with the following individualized plan to manage diabetes and patient agrees:   -  She returns with the much better blood glucose profile despite significant reduction in the insulin dose during her last visit.  - She has lost 6 pounds.  - I  will proceed to adjust her basal insulinTresiba to 50 units QHS ( previously took 130 units of basal insulin and 45 units of prandial insulin daily), and prandial insulin log to 5  units TIDAC for pre-meal BG readings of 90-117m/dl, plus  patient specific correction dose for unexpected hyperglycemia above 1563mdl, associated with strict monitoring of glucose  AC and HS. - Patient is warned not to take insulin without proper monitoring per orders. -Adjustment parameters are given for hypo and hyperglycemia in writing. -Patient is encouraged to call clinic for blood glucose levels less than 70 or above 300 mg /dl. - I will continue metformin 500 mg by mouth twice a day ,therapeutically suitable for patient.  -  She has history of intolerance for  [place], likely would not tolerate other  incretin therapy.  - Patient specific target  A1c;  LDL, HDL, Triglycerides, and  Waist Circumference were discussed in detail.  2) BP/HTN:  controlled. Continue current medications . 3) Lipids/HPL:   Lipid panel unknown, she is not statins. 4)  Weight/Diet: CDE Consult will be initiated , she has lost 6 pounds since last visit, exercise, and detailed carbohydrates information provided.  5) Chronic Care/Health Maintenance:  -Patient  will be considered for ACE inhibitor's and statins as appropriate, and encouraged to continue to follow up with Ophthalmology, Podiatrist at least yearly or according to recommendations, and advised to   stay away from smoking. I have recommended yearly flu vaccine and pneumonia vaccination at least every 5 years; moderate intensity exercise for up to 150 minutes weekly; and  sleep for at least 7 hours a day.  - 25 minutes of time was spent on the care of this patient , 50% of which was applied for counseling on diabetes complications and their preventions.  - Patient to bring meter and  blood glucose logs during their next visit.   - I advised patient to maintain close follow up with Glenda Chroman, MD for primary care needs.  Follow up plan: - Return in about 8 weeks (around 07/09/2016) for follow up with pre-visit labs, meter, and logs.  Glade Lloyd, MD Phone: [phone]  Fax: [phone]   05/14/2016, 10:27  AM

## 2016-05-15 ENCOUNTER — Encounter (HOSPITAL_COMMUNITY): Payer: Self-pay | Admitting: Emergency Medicine

## 2016-05-15 ENCOUNTER — Emergency Department (HOSPITAL_COMMUNITY): Payer: Medicare Other

## 2016-05-15 ENCOUNTER — Emergency Department (HOSPITAL_COMMUNITY)
Admission: EM | Admit: 2016-05-15 | Discharge: 2016-05-15 | Disposition: A | Discharge status: Home / Self Care | Payer: Medicare Other | Attending: Emergency Medicine | Admitting: Emergency Medicine

## 2016-05-15 DIAGNOSIS — I10 Essential (primary) hypertension: Secondary | ICD-10-CM | POA: Diagnosis not present

## 2016-05-15 DIAGNOSIS — S99911A Unspecified injury of right ankle, initial encounter: Secondary | ICD-10-CM | POA: Diagnosis present

## 2016-05-15 DIAGNOSIS — Z79899 Other long term (current) drug therapy: Secondary | ICD-10-CM | POA: Diagnosis not present

## 2016-05-15 DIAGNOSIS — Z7984 Long term (current) use of oral hypoglycemic drugs: Secondary | ICD-10-CM | POA: Insufficient documentation

## 2016-05-15 DIAGNOSIS — Y999 Unspecified external cause status: Secondary | ICD-10-CM | POA: Insufficient documentation

## 2016-05-15 DIAGNOSIS — S92001A Unspecified fracture of right calcaneus, initial encounter for closed fracture: Secondary | ICD-10-CM | POA: Diagnosis not present

## 2016-05-15 DIAGNOSIS — Y939 Activity, unspecified: Secondary | ICD-10-CM | POA: Insufficient documentation

## 2016-05-15 DIAGNOSIS — W108XXA Fall (on) (from) other stairs and steps, initial encounter: Secondary | ICD-10-CM | POA: Diagnosis not present

## 2016-05-15 DIAGNOSIS — E119 Type 2 diabetes mellitus without complications: Secondary | ICD-10-CM | POA: Diagnosis not present

## 2016-05-15 DIAGNOSIS — S82891A Other fracture of right lower leg, initial encounter for closed fracture: Secondary | ICD-10-CM

## 2016-05-15 DIAGNOSIS — S92031A Displaced avulsion fracture of tuberosity of right calcaneus, initial encounter for closed fracture: Secondary | ICD-10-CM | POA: Insufficient documentation

## 2016-05-15 DIAGNOSIS — Y929 Unspecified place or not applicable: Secondary | ICD-10-CM | POA: Insufficient documentation

## 2016-05-15 DIAGNOSIS — M25571 Pain in right ankle and joints of right foot: Secondary | ICD-10-CM | POA: Diagnosis not present

## 2016-05-15 DIAGNOSIS — M7989 Other specified soft tissue disorders: Secondary | ICD-10-CM | POA: Diagnosis not present

## 2016-05-15 MED ORDER — HYDROCODONE-ACETAMINOPHEN 5-325 MG PO TABS: 1.0000 | ORAL_TABLET | ORAL | 0 refills | Status: DC | PRN

## 2016-05-15 MED ORDER — IBUPROFEN 400 MG PO TABS
400.0000 mg | ORAL_TABLET | Freq: Once | ORAL | Status: AC
  Administered 2016-05-15: 400 mg via ORAL
  Filled 2016-05-15: qty 1

## 2016-05-15 MED ORDER — IBUPROFEN 600 MG PO TABS: 600.0000 mg | ORAL_TABLET | Freq: Four times a day (QID) | ORAL | 0 refills | Status: DC | PRN

## 2016-05-15 MED ORDER — ACETAMINOPHEN 325 MG PO TABS
650.0000 mg | ORAL_TABLET | Freq: Once | ORAL | Status: AC
  Administered 2016-05-15: 650 mg via ORAL
  Filled 2016-05-15: qty 2

## 2016-05-15 NOTE — ED Provider Notes (Signed)
[facility] DEPT Provider Note   CSN: [id number] Arrival date & time: 05/15/16  2018  First Provider Contact:  None       History   Chief Complaint Chief Complaint  Patient presents with  . Ankle Injury    HPI [name] is a 55 y.o. female.  Patient is a 55 year old female who presents to the emergency department with a complaint of right ankle pain.  The patient states that she missed 2 steps earlier this evening, around 2:00 PM. She noted immediate swelling. She tried to take care of it at home, but states the pain and the swelling kept getting progressively worse. The patient denies being on any anticoagulation medications on. She's not had any previous operations or procedures of the right ankle.      Past Medical History:  Diagnosis Date  . Anxiety disorder   . Chronic low back pain   . Colitis   . Diabetes mellitus   . Diabetic neuropathy ([place])   . Diverticulitis   . Fatty liver   . GERD (gastroesophageal reflux disease)   . Hypertension   . Irritable bowel syndrome   . Meniere's disease     Patient Active Problem List   Diagnosis Date Noted  . Morbid obesity due to excess calories ([place]) 04/29/2016  . Nausea and vomiting 01/17/2014  . HTN (hypertension) 10/25/2013  . Fatty liver disease, nonalcoholic 02/77/4128  . IBS (irritable bowel syndrome) 02/24/2012  . Type 2 diabetes mellitus, uncontrolled ([place]) 02/24/2012    Past Surgical History:  Procedure Laterality Date  . ABDOMINAL HYSTERECTOMY    . bladder tact  2005  . CHOLECYSTECTOMY  08/11  . COLONOSCOPY  2208  . COLONOSCOPY  In of September 2014   @ [facility]/Dr,.[name]  . [place]  . HERNIA REPAIR  09/17/11  . [facility]  2009   Preformed on the right shoulder  . PARTIAL HYSTERECTOMY  2005  . UPPER GASTROINTESTINAL ENDOSCOPY  2008  . URETERAL STENT PLACEMENT      OB History    No data available       Home Medications    Prior to Admission medications     Medication Sig Start Date End Date Taking? Authorizing Provider  acetaminophen (TYLENOL) 500 MG tablet Take 500 mg by mouth every 6 (six) hours as needed for mild pain.   Yes Historical Provider, MD  acetaZOLAMIDE (DIAMOX) 250 MG tablet Take 250 mg by mouth daily.  04/28/16  Yes Historical Provider, MD  cyanocobalamin (,VITAMIN B-12,) 1000 MCG/ML injection Inject 1,000 mcg into the muscle every 30 (thirty) days. 04/14/16  Yes Historical Provider, MD  diazepam (VALIUM) 5 MG tablet Take 2.5 mg by mouth every 12 (twelve) hours as needed for anxiety.  04/07/16  Yes Historical Provider, MD  dicyclomine (BENTYL) 10 MG capsule Take 10 mg by mouth 2 (two) times daily as needed for spasms.  02/18/16  Yes Historical Provider, MD  DULoxetine (CYMBALTA) 60 MG capsule Take 60 mg by mouth daily. 04/28/16  Yes Historical Provider, MD  estradiol (ESTRACE) 0.5 MG tablet Take 0.5 mg by mouth daily. 04/28/16  Yes Historical Provider, MD  fluticasone (FLONASE) 50 MCG/ACT nasal spray Place 2 sprays into both nostrils every morning.    Yes Historical Provider, MD  ibuprofen (ADVIL,MOTRIN) 600 MG tablet Take 600 mg by mouth every 6 (six) hours as needed for moderate pain.  03/28/16  Yes Historical Provider, MD  Meclizine HCl 25 MG CHEW Chew 25 mg  by mouth 2 (two) times daily as needed (dizziness).    Yes Historical Provider, MD  metFORMIN (GLUCOPHAGE) 500 MG tablet Take 1 tablet (500 mg total) by mouth 2 (two) times daily with a meal. 04/29/16  Yes Cassandria Anger, MD  metoprolol (LOPRESSOR) 50 MG tablet Take 25 mg by mouth every evening. Patient takes 1/2 tablet daily   Yes Historical Provider, MD  NOVOLOG FLEXPEN 100 UNIT/ML FlexPen Inject 2-11 Units into the skin 3 (three) times daily with meals. 5 units with meals three times daily.  Also has sliding scale of 2-11 units. 04/14/16  Yes Historical Provider, MD  oxazepam (SERAX) 10 MG capsule Take 10 mg by mouth at bedtime. 04/07/16  Yes Historical Provider, MD  pantoprazole  (PROTONIX) 40 MG tablet Take 40 mg by mouth daily.  04/28/16  Yes Historical Provider, MD  promethazine (PHENERGAN) 25 MG tablet Take 25 mg by mouth daily as needed for nausea or vomiting.  02/25/16  Yes Historical Provider, MD  traMADol (ULTRAM) 50 MG tablet Take 50 mg by mouth 3 (three) times daily as needed for moderate pain or severe pain.  02/18/16  Yes Historical Provider, MD  TRESIBA FLEXTOUCH 200 UNIT/ML SOPN INJECT 50 UNITS SUBCUTANEOUSLY AT BEDTIME. 04/28/16  Yes Historical Provider, MD    Family History Family History  Problem Relation Age of Onset  . Healthy Mother   . Heart disease Father   . Esophageal cancer Brother   . Healthy Son   . Healthy Son     Social History Social History  Substance Use Topics  . Smoking status: Never Smoker  . Smokeless tobacco: Never Used  . Alcohol use No     Allergies   Flagyl [metronidazole hcl]   Review of Systems Review of Systems   Physical Exam Updated Vital Signs BP 166/74 (BP Location: Left Arm)   Pulse 100   Temp 98 F (36.7 C) (Oral)   Resp 16   Ht 5' 1"  (1.549 m)   Wt 88 kg   SpO2 98%   BMI 36.66 kg/m   Physical Exam  Constitutional: She is oriented to person, place, and time. She appears well-developed and well-nourished.  Non-toxic appearance.  HENT:  Head: Normocephalic.  Right Ear: Tympanic membrane and external ear normal.  Left Ear: Tympanic membrane and external ear normal.  Eyes: EOM and lids are normal. Pupils are equal, round, and reactive to light.  Neck: Normal range of motion. Neck supple. Carotid bruit is not present.  Cardiovascular: Normal rate, regular rhythm, normal heart sounds, intact distal pulses and normal pulses.   Pulmonary/Chest: Breath sounds normal. No respiratory distress.  Abdominal: Soft. Bowel sounds are normal. There is no tenderness. There is no guarding.  Musculoskeletal:       Right ankle: She exhibits decreased range of motion and swelling. Tenderness. Lateral malleolus  tenderness found.  Lymphadenopathy:       Head (right side): No submandibular adenopathy present.       Head (left side): No submandibular adenopathy present.    She has no cervical adenopathy.  Neurological: She is alert and oriented to person, place, and time. She has normal strength. No cranial nerve deficit or sensory deficit.  Skin: Skin is warm and dry.  Psychiatric: She has a normal mood and affect. Her speech is normal.  Nursing note and vitals reviewed.    ED Treatments / Results  Labs (all labs ordered are listed, but only abnormal results are displayed) Labs Reviewed - No  data to display  EKG  EKG Interpretation None       Radiology Dg Ankle Complete Right  Result Date: 05/15/2016 CLINICAL DATA:  Acute right ankle pain and swelling following fall today. Initial encounter. EXAM: RIGHT ANKLE - COMPLETE 3+ VIEW COMPARISON:  03/03/2016 right foot radiographs FINDINGS: A probable avulsion fracture of the lateral calcaneus identified. Lateral soft tissue swelling is present. No other fracture, subluxation or dislocation identified. IMPRESSION: Probable lateral calcaneal avulsion fracture. Electronically Signed   By: Margarette Canada M.D.   On: 05/15/2016 21:14    Procedures Procedures (including critical care time) FRACTURE CARE RIGHT ANKLE. Patient is a 55 year old female who sustained injury to the right ankle. Review the x-ray shows a avulsion type fracture of the right calcaneus. I reviewed the x-ray with the patient in terms which he understands. I discussed with patient the plan of treatment in terms which she understands and she is in agreement. The patient was fitted with an ankle stirrup splint and crutches. Ice pack was applied. The patient was treated with ibuprofen and Norco for pain on. The patient tolerated the procedure without problem. Medications Ordered in ED Medications  ibuprofen (ADVIL,MOTRIN) tablet 400 mg (not administered)  acetaminophen (TYLENOL) tablet  650 mg (not administered)     Initial Impression / Assessment and Plan / ED Course  I have reviewed the triage vital signs and the nursing notes.  Pertinent labs & imaging results that were available during my care of the patient were reviewed by me and considered in my medical decision making (see chart for details).  Clinical Course    **I have reviewed nursing notes, vital signs, and all appropriate lab and imaging results for this patient.  Final Clinical Impressions(s) / ED Diagnoses Pt has a calcaneal avulsion fracture. Fracture care carried out. Pt to follow up with Dr Aline Brochure. Rx for norco given to patient for pain.   Final diagnoses:  Avulsion fracture of ankle, right, closed, initial encounter    New Prescriptions New Prescriptions   No medications on file     Lily Kocher, PA-C 05/16/16 0048    Noemi Chapel, MD 05/16/16 1316

## 2016-05-15 NOTE — ED Triage Notes (Signed)
Patient states "I missed the last two steps and fell hurting my ankle today." Patient has swelling and bruising noted to right ankle at triage.

## 2016-05-15 NOTE — Discharge Instructions (Signed)
Your x-ray reveals an avulsion fracture of your calcaneal bone in your ankle. Please use the ankle splint on until seen by the orthopedic specialist. Please keep your ankle elevated above your waist, and apply ice. Use 600 mg of ibuprofen with breakfast, lunch, dinner, and at bedtime. Use Norco for more severe pain. Please see Dr. Aline Brochure for orthopedic evaluation as soon as possible.

## 2016-05-19 ENCOUNTER — Ambulatory Visit (INDEPENDENT_AMBULATORY_CARE_PROVIDER_SITE_OTHER): Payer: Medicare Other | Admitting: Orthopedic Surgery

## 2016-05-19 DIAGNOSIS — S93401A Sprain of unspecified ligament of right ankle, initial encounter: Secondary | ICD-10-CM

## 2016-05-19 DIAGNOSIS — S82891A Other fracture of right lower leg, initial encounter for closed fracture: Secondary | ICD-10-CM | POA: Diagnosis not present

## 2016-05-19 DIAGNOSIS — Z299 Encounter for prophylactic measures, unspecified: Secondary | ICD-10-CM | POA: Diagnosis not present

## 2016-05-19 NOTE — Patient Instructions (Signed)
Ankle exercises

## 2016-05-19 NOTE — Progress Notes (Signed)
Chief complaint pain right ankle   HPI  55 year old female injured her right ankle when she was at [employer] in [place]. She said there were 2 step she backed up twisted her right ankle and bruised her left knee  X-rays of the ankle show probable lateral process avulsion fracture from talus. She was placed in an ASO and weightbearing as tolerated  Location of pain right lateral ankle. Quality dull ache. Severity mild to moderate. Duration 4 days injury date 05/15/2016 timing apparently worse with weightbearing associated with swelling  Left knee pain is mild there is a bruise over the left medial knee area.  She indicates that she has Mnire's disease and frequently falls and has injured both ankles in the past with multiple sprains of the left  Review of Systems  Constitutional: Negative for chills and fever.  Musculoskeletal: Positive for joint pain.  Neurological: Negative for tingling.    Past Medical History:  Diagnosis Date  . Anxiety disorder   . Chronic low back pain   . Colitis   . Diabetes mellitus   . Diabetic neuropathy ([place])   . Diverticulitis   . Fatty liver   . GERD (gastroesophageal reflux disease)   . Hypertension   . Irritable bowel syndrome   . Meniere's disease     Past Surgical History:  Procedure Laterality Date  . ABDOMINAL HYSTERECTOMY    . bladder tact  2005  . CHOLECYSTECTOMY  08/11  . COLONOSCOPY  2208  . COLONOSCOPY  In of September 2014   @ [facility]/Dr,.[name]  . [place]  . HERNIA REPAIR  09/17/11  . [place]  2009   Preformed on the right shoulder  . PARTIAL HYSTERECTOMY  2005  . UPPER GASTROINTESTINAL ENDOSCOPY  2008  . URETERAL STENT PLACEMENT     Family History  Problem Relation Age of Onset  . Healthy Mother   . Heart disease Father   . Esophageal cancer Brother   . Healthy Son   . Healthy Son    Social History  Substance Use Topics  . Smoking status: Never Smoker  . Smokeless tobacco:  Never Used  . Alcohol use No    Current Outpatient Prescriptions:  .  acetaminophen (TYLENOL) 500 MG tablet, Take 500 mg by mouth every 6 (six) hours as needed for mild pain., Disp: , Rfl:  .  acetaZOLAMIDE (DIAMOX) 250 MG tablet, Take 250 mg by mouth daily. , Disp: , Rfl:  .  cyanocobalamin (,VITAMIN B-12,) 1000 MCG/ML injection, Inject 1,000 mcg into the muscle every 30 (thirty) days., Disp: , Rfl: 5 .  diazepam (VALIUM) 5 MG tablet, Take 2.5 mg by mouth every 12 (twelve) hours as needed for anxiety. , Disp: , Rfl:  .  dicyclomine (BENTYL) 10 MG capsule, Take 10 mg by mouth 2 (two) times daily as needed for spasms. , Disp: , Rfl:  .  DULoxetine (CYMBALTA) 60 MG capsule, Take 60 mg by mouth daily., Disp: , Rfl:  .  estradiol (ESTRACE) 0.5 MG tablet, Take 0.5 mg by mouth daily., Disp: , Rfl:  .  fluticasone (FLONASE) 50 MCG/ACT nasal spray, Place 2 sprays into both nostrils every morning. , Disp: , Rfl:  .  HYDROcodone-acetaminophen (NORCO/VICODIN) 5-325 MG tablet, Take 1 tablet by mouth every 4 (four) hours as needed., Disp: 15 tablet, Rfl: 0 .  ibuprofen (ADVIL,MOTRIN) 600 MG tablet, Take 1 tablet (600 mg total) by mouth every 6 (six) hours as needed., Disp: 30 tablet,  Rfl: 0 .  Meclizine HCl 25 MG CHEW, Chew 25 mg by mouth 2 (two) times daily as needed (dizziness). , Disp: , Rfl:  .  metFORMIN (GLUCOPHAGE) 500 MG tablet, Take 1 tablet (500 mg total) by mouth 2 (two) times daily with a meal., Disp: 60 tablet, Rfl: 3 .  metoprolol (LOPRESSOR) 50 MG tablet, Take 25 mg by mouth every evening. Patient takes 1/2 tablet daily, Disp: , Rfl:  .  NOVOLOG FLEXPEN 100 UNIT/ML FlexPen, Inject 2-11 Units into the skin 3 (three) times daily with meals. 5 units with meals three times daily.  Also has sliding scale of 2-11 units., Disp: , Rfl:  .  oxazepam (SERAX) 10 MG capsule, Take 10 mg by mouth at bedtime., Disp: , Rfl:  .  pantoprazole (PROTONIX) 40 MG tablet, Take 40 mg by mouth daily. , Disp: , Rfl:  .   promethazine (PHENERGAN) 25 MG tablet, Take 25 mg by mouth daily as needed for nausea or vomiting. , Disp: , Rfl:  .  traMADol (ULTRAM) 50 MG tablet, Take 50 mg by mouth 3 (three) times daily as needed for moderate pain or severe pain. , Disp: , Rfl:  .  TRESIBA FLEXTOUCH 200 UNIT/ML SOPN, INJECT 50 UNITS SUBCUTANEOUSLY AT BEDTIME., Disp: , Rfl: 4  There were no vitals taken for this visit.  Physical Exam  Constitutional: She is oriented to person, place, and time. She appears well-developed and well-nourished. No distress.  Cardiovascular: Normal rate and intact distal pulses.   Neurological: She is alert and oriented to person, place, and time. She has normal reflexes. She exhibits normal muscle tone. Coordination normal.  Skin: Skin is warm and dry. No rash noted. She is not diaphoretic. No erythema. No pallor.  Psychiatric: She has a normal mood and affect. Her behavior is normal. Judgment and thought content normal.    Ortho Exam Left knee ligaments are stable range of motion is normal she has a bruise over the medial aspect of the knee and proximal tibia there are no strength deficits and there is mild tenderness  Right ankle is swollen and tender especially on the lateral side. Dorsiflexion is 5 plantar flexion is 15 drawer test is stable skin is slightly ecchymotic pulses are normal and sensation is normal  ASSESSMENT: My personal interpretation of the images:  The hospital films show a small avulsion fracture from the lateral process probably of the calcaneus or talus most likely calcaneus.  Plan is to start active range of motion exercises as instructed in wear ASO for 6 weeks. The patient is concerned that she may fall again and would like a more significant device we placed her in a short [place], MD 05/19/2016 5:26 PM

## 2016-05-22 DIAGNOSIS — Z1231 Encounter for screening mammogram for malignant neoplasm of breast: Secondary | ICD-10-CM | POA: Diagnosis not present

## 2016-06-11 ENCOUNTER — Encounter: Payer: Self-pay | Admitting: Orthopedic Surgery

## 2016-06-11 ENCOUNTER — Ambulatory Visit (INDEPENDENT_AMBULATORY_CARE_PROVIDER_SITE_OTHER): Payer: Medicare Other | Admitting: Orthopedic Surgery

## 2016-06-11 VITALS — BP 122/79 | HR 83 | Ht 61.0 in | Wt 191.0 lb

## 2016-06-11 DIAGNOSIS — S93401A Sprain of unspecified ligament of right ankle, initial encounter: Secondary | ICD-10-CM | POA: Diagnosis not present

## 2016-06-11 MED ORDER — HYDROCODONE-ACETAMINOPHEN 5-325 MG PO TABS: 1.0000 | ORAL_TABLET | Freq: Four times a day (QID) | ORAL | 0 refills | Status: DC | PRN

## 2016-06-11 NOTE — Progress Notes (Addendum)
Patient ID: JAILYNN LAVALAIS, female   DOB: 1961-06-08, 55 y.o.   MRN: [id number]  No chief complaint on file.   HPI MILLENIA WALDVOGEL is a 55 y.o. female.  Patient has history of avulsion fracture from the lateral surface of the foot probably calcaneus lateral process HPI  Presents 3 weeks early for evaluation of pain questionable increasing  She is in a Cam Walker  Increased pain around the ankle  Review of Systems Review of Systems  Aching at the level of the shin   Physical Exam BP 122/79   Pulse 83   Ht 5' 1"  (1.549 m)   Wt 191 lb (86.6 kg)   BMI 36.09 kg/m  She is awake alert and oriented 3 mood and affect normal she is in some distress. She is ambulatory with a limp and a short Cam Walker Skin remains intact Lateral ankle swelling and tenderness including subtalar joint anterior talofibular ligament. Tenderness at the top of the area where the boot stops Normal pulse no sensory abnormalities   Encounter Diagnosis  Name Primary?  Marland Kitchen Ankle sprain, right, initial encounter Yes   Recommend continue ibuprofen Add Epsom salts soaks And Norco 5 mg for pain  Keep appointment for 3 weeks

## 2016-06-11 NOTE — Patient Instructions (Signed)
Epsom salt soak twice a day for 20 min  Continue ibuprofen   Start hydrococodone

## 2016-06-13 ENCOUNTER — Ambulatory Visit: Payer: Medicare Other | Admitting: Nutrition

## 2016-06-16 DIAGNOSIS — E11319 Type 2 diabetes mellitus with unspecified diabetic retinopathy without macular edema: Secondary | ICD-10-CM | POA: Diagnosis not present

## 2016-06-19 ENCOUNTER — Other Ambulatory Visit: Payer: Self-pay | Admitting: "Endocrinology

## 2016-06-19 ENCOUNTER — Telehealth: Payer: Self-pay | Admitting: "Endocrinology

## 2016-06-19 MED ORDER — NOVOLOG FLEXPEN 100 UNIT/ML ~~LOC~~ SOPN: 5.0000 [IU] | PEN_INJECTOR | Freq: Three times a day (TID) | SUBCUTANEOUS | 2 refills | Status: DC

## 2016-06-19 NOTE — Telephone Encounter (Signed)
Patient is requesting a new RX for her Novalog to be sent to [facility] in [place]. Pt states Dr.Nida has not written this RX for her yet?

## 2016-06-21 DIAGNOSIS — K219 Gastro-esophageal reflux disease without esophagitis: Secondary | ICD-10-CM | POA: Diagnosis not present

## 2016-06-21 DIAGNOSIS — K5792 Diverticulitis of intestine, part unspecified, without perforation or abscess without bleeding: Secondary | ICD-10-CM | POA: Diagnosis not present

## 2016-06-21 DIAGNOSIS — E1140 Type 2 diabetes mellitus with diabetic neuropathy, unspecified: Secondary | ICD-10-CM | POA: Diagnosis not present

## 2016-06-21 DIAGNOSIS — Z79899 Other long term (current) drug therapy: Secondary | ICD-10-CM | POA: Diagnosis not present

## 2016-06-21 DIAGNOSIS — R1084 Generalized abdominal pain: Secondary | ICD-10-CM | POA: Diagnosis not present

## 2016-06-21 DIAGNOSIS — I10 Essential (primary) hypertension: Secondary | ICD-10-CM | POA: Diagnosis not present

## 2016-06-21 DIAGNOSIS — Z794 Long term (current) use of insulin: Secondary | ICD-10-CM | POA: Diagnosis not present

## 2016-06-23 DIAGNOSIS — K5732 Diverticulitis of large intestine without perforation or abscess without bleeding: Secondary | ICD-10-CM | POA: Diagnosis not present

## 2016-06-23 DIAGNOSIS — M545 Low back pain: Secondary | ICD-10-CM | POA: Diagnosis not present

## 2016-06-23 DIAGNOSIS — E1142 Type 2 diabetes mellitus with diabetic polyneuropathy: Secondary | ICD-10-CM | POA: Diagnosis not present

## 2016-06-24 DIAGNOSIS — R5383 Other fatigue: Secondary | ICD-10-CM | POA: Diagnosis not present

## 2016-06-25 ENCOUNTER — Emergency Department (HOSPITAL_COMMUNITY): Payer: Medicare Other

## 2016-06-25 ENCOUNTER — Encounter (HOSPITAL_COMMUNITY): Payer: Self-pay

## 2016-06-25 ENCOUNTER — Emergency Department (HOSPITAL_COMMUNITY)
Admission: EM | Admit: 2016-06-25 | Discharge: 2016-06-26 | Disposition: A | Discharge status: Home / Self Care | Payer: Medicare Other | Attending: Emergency Medicine | Admitting: Emergency Medicine

## 2016-06-25 DIAGNOSIS — N838 Other noninflammatory disorders of ovary, fallopian tube and broad ligament: Secondary | ICD-10-CM

## 2016-06-25 DIAGNOSIS — Z791 Long term (current) use of non-steroidal anti-inflammatories (NSAID): Secondary | ICD-10-CM | POA: Diagnosis not present

## 2016-06-25 DIAGNOSIS — Y939 Activity, unspecified: Secondary | ICD-10-CM | POA: Insufficient documentation

## 2016-06-25 DIAGNOSIS — D649 Anemia, unspecified: Secondary | ICD-10-CM | POA: Diagnosis not present

## 2016-06-25 DIAGNOSIS — I10 Essential (primary) hypertension: Secondary | ICD-10-CM | POA: Diagnosis not present

## 2016-06-25 DIAGNOSIS — R1031 Right lower quadrant pain: Secondary | ICD-10-CM

## 2016-06-25 DIAGNOSIS — M25572 Pain in left ankle and joints of left foot: Secondary | ICD-10-CM | POA: Diagnosis not present

## 2016-06-25 DIAGNOSIS — Z79899 Other long term (current) drug therapy: Secondary | ICD-10-CM | POA: Diagnosis not present

## 2016-06-25 DIAGNOSIS — Y999 Unspecified external cause status: Secondary | ICD-10-CM | POA: Diagnosis not present

## 2016-06-25 DIAGNOSIS — X58XXXA Exposure to other specified factors, initial encounter: Secondary | ICD-10-CM | POA: Diagnosis not present

## 2016-06-25 DIAGNOSIS — K5790 Diverticulosis of intestine, part unspecified, without perforation or abscess without bleeding: Secondary | ICD-10-CM | POA: Diagnosis not present

## 2016-06-25 DIAGNOSIS — Z794 Long term (current) use of insulin: Secondary | ICD-10-CM | POA: Diagnosis not present

## 2016-06-25 DIAGNOSIS — E119 Type 2 diabetes mellitus without complications: Secondary | ICD-10-CM | POA: Diagnosis not present

## 2016-06-25 DIAGNOSIS — M7989 Other specified soft tissue disorders: Secondary | ICD-10-CM | POA: Diagnosis not present

## 2016-06-25 DIAGNOSIS — N83201 Unspecified ovarian cyst, right side: Secondary | ICD-10-CM | POA: Insufficient documentation

## 2016-06-25 DIAGNOSIS — S93402A Sprain of unspecified ligament of left ankle, initial encounter: Secondary | ICD-10-CM | POA: Diagnosis not present

## 2016-06-25 DIAGNOSIS — Y929 Unspecified place or not applicable: Secondary | ICD-10-CM | POA: Insufficient documentation

## 2016-06-25 DIAGNOSIS — Z7984 Long term (current) use of oral hypoglycemic drugs: Secondary | ICD-10-CM | POA: Diagnosis not present

## 2016-06-25 HISTORY — DX: Pain, unspecified: R52

## 2016-06-25 LAB — CBC
HEMATOCRIT: 31.4 % — AB (ref 36.0–46.0)
Hemoglobin: 9.8 g/dL — ABNORMAL LOW (ref 12.0–15.0)
MCH: 24.7 pg — ABNORMAL LOW (ref 26.0–34.0)
MCHC: 31.2 g/dL (ref 30.0–36.0)
MCV: 79.3 fL (ref 78.0–100.0)
PLATELETS: 206 10*3/uL (ref 150–400)
RBC: 3.96 MIL/uL (ref 3.87–5.11)
RDW: 18.1 % — AB (ref 11.5–15.5)
WBC: 10.7 10*3/uL — ABNORMAL HIGH (ref 4.0–10.5)

## 2016-06-25 LAB — URINE MICROSCOPIC-ADD ON: RBC / HPF: NONE SEEN RBC/hpf (ref 0–5)

## 2016-06-25 LAB — COMPREHENSIVE METABOLIC PANEL
ALBUMIN: 3.3 g/dL — AB (ref 3.5–5.0)
ALK PHOS: 73 U/L (ref 38–126)
ALT: 19 U/L (ref 14–54)
AST: 37 U/L (ref 15–41)
Anion gap: 5 (ref 5–15)
BILIRUBIN TOTAL: 0.4 mg/dL (ref 0.3–1.2)
BUN: 12 mg/dL (ref 6–20)
CALCIUM: 8.6 mg/dL — AB (ref 8.9–10.3)
CO2: 23 mmol/L (ref 22–32)
CREATININE: 0.59 mg/dL (ref 0.44–1.00)
Chloride: 110 mmol/L (ref 101–111)
GFR calc Af Amer: >60 mL/min (ref >60)
GFR calc non Af Amer: >60 mL/min (ref >60)
GLUCOSE: 77 mg/dL (ref 65–99)
POTASSIUM: 3.3 mmol/L — AB (ref 3.5–5.1)
Sodium: 138 mmol/L (ref 135–145)
TOTAL PROTEIN: 7.6 g/dL (ref 6.5–8.1)

## 2016-06-25 LAB — URINALYSIS, ROUTINE W REFLEX MICROSCOPIC
BILIRUBIN URINE: NEGATIVE
GLUCOSE, UA: NEGATIVE mg/dL
Hgb urine dipstick: NEGATIVE
KETONES UR: NEGATIVE mg/dL
NITRITE: NEGATIVE
PH: 7.5 (ref 5.0–8.0)
Protein, ur: NEGATIVE mg/dL
Specific Gravity, Urine: 1.01 (ref 1.005–1.030)

## 2016-06-25 LAB — LIPASE, BLOOD: Lipase: 31 U/L (ref 11–51)

## 2016-06-25 MED ORDER — ONDANSETRON HCL 4 MG/2ML IJ SOLN
4.0000 mg | Freq: Once | INTRAMUSCULAR | Status: AC
  Administered 2016-06-25: 4 mg via INTRAVENOUS
  Filled 2016-06-25: qty 2

## 2016-06-25 MED ORDER — MORPHINE SULFATE (PF) 4 MG/ML IV SOLN
4.0000 mg | Freq: Once | INTRAVENOUS | Status: AC
  Administered 2016-06-25: 4 mg via INTRAVENOUS
  Filled 2016-06-25: qty 1

## 2016-06-25 MED ORDER — DIATRIZOATE MEGLUMINE & SODIUM 66-10 % PO SOLN
ORAL | Status: AC
  Administered 2016-06-25: 23:00:00
  Filled 2016-06-25: qty 30

## 2016-06-25 MED ORDER — SODIUM CHLORIDE 0.9 % IV BOLUS (SEPSIS)
1000.0000 mL | Freq: Once | INTRAVENOUS | Status: AC
  Administered 2016-06-25: 1000 mL via INTRAVENOUS

## 2016-06-25 MED ORDER — IOPAMIDOL (ISOVUE-300) INJECTION 61%
100.0000 mL | Freq: Once | INTRAVENOUS | Status: AC | PRN
  Administered 2016-06-25: 100 mL via INTRAVENOUS

## 2016-06-25 NOTE — ED Triage Notes (Signed)
Patient ambulatory in and out of triage without deficit

## 2016-06-25 NOTE — ED Triage Notes (Signed)
Patient states she was seen and treated at [facility] last week for diverticulitis. Patient states she is on ABX for treatment for that. Patient states tonight she is having RLQ pain. Patient also c/o pain to multiple sites: lower back, right ankle, bilateral feet, and left ankle. Patient states she has been taking hydrocodone for pain without relief.

## 2016-06-26 ENCOUNTER — Other Ambulatory Visit (HOSPITAL_COMMUNITY): Payer: Self-pay | Admitting: Emergency Medicine

## 2016-06-26 ENCOUNTER — Ambulatory Visit (HOSPITAL_COMMUNITY)
Admission: RE | Admit: 2016-06-26 | Discharge: 2016-06-26 | Disposition: A | Discharge status: Home / Self Care | Payer: Medicare Other | Source: Ambulatory Visit | Attending: Emergency Medicine | Admitting: Emergency Medicine

## 2016-06-26 DIAGNOSIS — N839 Noninflammatory disorder of ovary, fallopian tube and broad ligament, unspecified: Secondary | ICD-10-CM

## 2016-06-26 DIAGNOSIS — R1031 Right lower quadrant pain: Secondary | ICD-10-CM | POA: Insufficient documentation

## 2016-06-26 DIAGNOSIS — N83201 Unspecified ovarian cyst, right side: Secondary | ICD-10-CM | POA: Diagnosis not present

## 2016-06-26 DIAGNOSIS — Z90710 Acquired absence of both cervix and uterus: Secondary | ICD-10-CM | POA: Insufficient documentation

## 2016-06-26 DIAGNOSIS — N838 Other noninflammatory disorders of ovary, fallopian tube and broad ligament: Secondary | ICD-10-CM

## 2016-06-26 MED ORDER — FERROUS SULFATE 325 (65 FE) MG PO TABS
325.0000 mg | ORAL_TABLET | Freq: Every day | ORAL | 0 refills | Status: DC
Start: 2016-06-26 — End: 2018-09-24

## 2016-06-26 MED ORDER — MORPHINE SULFATE (PF) 4 MG/ML IV SOLN
4.0000 mg | Freq: Once | INTRAVENOUS | Status: AC
  Administered 2016-06-26: 4 mg via INTRAVENOUS
  Filled 2016-06-26: qty 1

## 2016-06-26 NOTE — ED Provider Notes (Signed)
[place] DEPT Provider Note   CSN: [id number] Arrival date & time: 06/25/16  2040     History   Chief Complaint Chief Complaint  Patient presents with  . Abdominal Pain    HPI [name] is a 55 y.o. female.  HPI   [name] is a 55 y.o. female who presents to the Emergency Department complaining of right lower quadrant pain for one day.  She states that she was seen at [facility] last Saturday and treated with Cipro for diverticulitis.  Tonight, she describes a constant pain to her right lower abdomen that is not associated with food intake or vomiting.  She also complains of pain to her left ankle or lower back.  She was seen here last month after a fall in which she suffered an avulsion fx of right ankle. She reports continued pain since the fall.  She denies fever, chills, chest pain, numbness or weakness of the LE's, urine or bowel changes.    Past Medical History:  Diagnosis Date  . Anxiety disorder   . Chronic low back pain   . Colitis   . Diabetes mellitus   . Diabetic neuropathy ([place])   . Diverticulitis   . Fatty liver   . GERD (gastroesophageal reflux disease)   . Hypertension   . Irritable bowel syndrome   . Meniere's disease   . Pain     Patient Active Problem List   Diagnosis Date Noted  . Morbid obesity due to excess calories ([place]) 04/29/2016  . Nausea and vomiting 01/17/2014  . HTN (hypertension) 10/25/2013  . Fatty liver disease, nonalcoholic 54/00/8676  . IBS (irritable bowel syndrome) 02/24/2012  . Type 2 diabetes mellitus, uncontrolled ([place]) 02/24/2012    Past Surgical History:  Procedure Laterality Date  . ABDOMINAL HYSTERECTOMY    . bladder tact  2005  . CHOLECYSTECTOMY  08/11  . COLONOSCOPY  2208  . COLONOSCOPY  In of September 2014   @ [facility]/Dr,.[name]  . [place]  . HERNIA REPAIR  09/17/11  . [place]  2009   Preformed on the right shoulder  . PARTIAL HYSTERECTOMY  2005  . UPPER GASTROINTESTINAL  ENDOSCOPY  2008  . URETERAL STENT PLACEMENT      OB History    No data available       Home Medications    Prior to Admission medications   Medication Sig Start Date End Date Taking? Authorizing Provider  acetaminophen (TYLENOL) 500 MG tablet Take 500 mg by mouth every 6 (six) hours as needed for mild pain.   Yes Historical Provider, MD  acetaZOLAMIDE (DIAMOX) 250 MG tablet Take 250 mg by mouth daily.  04/28/16  Yes Historical Provider, MD  acyclovir (ZOVIRAX) 400 MG tablet Take 1 tablet by mouth 3 (three) times daily as needed (fever blisters).  06/25/16  Yes Historical Provider, MD  ciprofloxacin (CIPRO) 500 MG tablet Take 1 tablet by mouth 2 (two) times daily. Starting 06/23/2016 x 10 days.  For diverticulitis. 06/23/16  Yes Historical Provider, MD  cyanocobalamin (,VITAMIN B-12,) 1000 MCG/ML injection Inject 1,000 mcg into the muscle every 30 (thirty) days. 04/14/16  Yes Historical Provider, MD  diazepam (VALIUM) 5 MG tablet Take 2.5 mg by mouth every 12 (twelve) hours as needed for anxiety.  04/07/16  Yes Historical Provider, MD  dicyclomine (BENTYL) 10 MG capsule Take 10 mg by mouth 2 (two) times daily as needed for spasms.  02/18/16  Yes Historical Provider, MD  DULoxetine (CYMBALTA)  60 MG capsule Take 60 mg by mouth daily. 04/28/16  Yes Historical Provider, MD  estradiol (ESTRACE) 0.5 MG tablet Take 0.5 mg by mouth daily. 04/28/16  Yes Historical Provider, MD  fluticasone (FLONASE) 50 MCG/ACT nasal spray Place 2 sprays into both nostrils every morning.    Yes Historical Provider, MD  HYDROcodone-acetaminophen (NORCO/VICODIN) 5-325 MG tablet Take 1 tablet by mouth every 6 (six) hours as needed. Patient taking differently: Take 1 tablet by mouth every 6 (six) hours as needed for moderate pain.  06/11/16  Yes Carole Civil, MD  ibuprofen (ADVIL,MOTRIN) 600 MG tablet Take 1 tablet (600 mg total) by mouth every 6 (six) hours as needed. Patient taking differently: Take 600 mg by mouth every 6  (six) hours as needed for moderate pain.  05/15/16  Yes Lily Kocher, PA-C  Meclizine HCl 25 MG CHEW Chew 25 mg by mouth 2 (two) times daily as needed (dizziness).    Yes Historical Provider, MD  metFORMIN (GLUCOPHAGE) 500 MG tablet Take 1 tablet (500 mg total) by mouth 2 (two) times daily with a meal. 04/29/16  Yes Cassandria Anger, MD  metoprolol (LOPRESSOR) 50 MG tablet Take 25 mg by mouth daily. Patient takes 1/2 tablet daily   Yes Historical Provider, MD  NOVOLOG FLEXPEN 100 UNIT/ML FlexPen Inject 5-11 Units into the skin 3 (three) times daily with meals. Patient taking differently: Inject 11-15 Units into the skin 3 (three) times daily with meals.  06/19/16  Yes Cassandria Anger, MD  ondansetron (ZOFRAN) 4 MG tablet Take 1 tablet by mouth 3 (three) times daily as needed for nausea/vomiting. 06/06/16  Yes Historical Provider, MD  oxazepam (SERAX) 10 MG capsule Take 10 mg by mouth at bedtime. 04/07/16  Yes Historical Provider, MD  pantoprazole (PROTONIX) 40 MG tablet Take 40 mg by mouth daily.  04/28/16  Yes Historical Provider, MD  phentermine 15 MG capsule Take 15 mg by mouth daily. 06/05/16  Yes Historical Provider, MD  promethazine (PHENERGAN) 25 MG tablet Take 25 mg by mouth daily as needed for nausea or vomiting.  02/25/16  Yes Historical Provider, MD  traMADol (ULTRAM) 50 MG tablet Take 50 mg by mouth 3 (three) times daily as needed for moderate pain or severe pain.  02/18/16  Yes Historical Provider, MD  TRESIBA FLEXTOUCH 200 UNIT/ML SOPN INJECT 50 UNITS SUBCUTANEOUSLY AT BEDTIME. 04/28/16  Yes Historical Provider, MD    Family History Family History  Problem Relation Age of Onset  . Healthy Mother   . Heart disease Father   . Esophageal cancer Brother   . Healthy Son   . Healthy Son     Social History Social History  Substance Use Topics  . Smoking status: Never Smoker  . Smokeless tobacco: Never Used  . Alcohol use No     Allergies   Flagyl [metronidazole  hcl]   Review of Systems Review of Systems  Constitutional: Negative for appetite change, chills and fever.  Respiratory: Negative for shortness of breath.   Cardiovascular: Negative for chest pain.  Gastrointestinal: Positive for abdominal pain and nausea. Negative for blood in stool, diarrhea and vomiting.  Genitourinary: Negative for decreased urine volume, difficulty urinating, dysuria and flank pain.  Musculoskeletal: Positive for arthralgias (left ankle pain) and back pain (mid low back pain). Negative for joint swelling.  Skin: Negative for color change and rash.  Neurological: Negative for dizziness, weakness and numbness.  Hematological: Negative for adenopathy.  All other systems reviewed and are negative.  Physical Exam Updated Vital Signs BP 126/89   Pulse 97   Temp 98.3 F (36.8 C) (Oral)   Resp 16   Ht 5' 2"  (1.575 m)   Wt 86.2 kg   SpO2 97%   BMI 34.75 kg/m   Physical Exam  Constitutional: She is oriented to person, place, and time. She appears well-developed and well-nourished. No distress.  HENT:  Head: Normocephalic and atraumatic.  Mouth/Throat: Oropharynx is clear and moist.  Cardiovascular: Normal rate, regular rhythm, normal heart sounds and intact distal pulses.   No murmur heard. Pulmonary/Chest: Effort normal and breath sounds normal. No respiratory distress.  Abdominal: Soft. Bowel sounds are normal. She exhibits no distension and no mass. There is no hepatosplenomegaly. There is tenderness in the right lower quadrant. There is no rebound and no guarding.    Mild ttp of the RLQ.  No guarding or rebound tenderness.  Musculoskeletal: Normal range of motion. She exhibits no edema.  Neurological: She is alert and oriented to person, place, and time. She exhibits normal muscle tone. Coordination normal.  Skin: Skin is warm and dry.  Nursing note and vitals reviewed.    ED Treatments / Results  Labs (all labs ordered are listed, but only  abnormal results are displayed) Labs Reviewed  COMPREHENSIVE METABOLIC PANEL - Abnormal; Notable for the following:       Result Value   Potassium 3.3 (*)    Calcium 8.6 (*)    Albumin 3.3 (*)    All other components within normal limits  CBC - Abnormal; Notable for the following:    WBC 10.7 (*)    Hemoglobin 9.8 (*)    HCT 31.4 (*)    MCH 24.7 (*)    RDW 18.1 (*)    All other components within normal limits  URINALYSIS, ROUTINE W REFLEX MICROSCOPIC (NOT AT [facility]) - Abnormal; Notable for the following:    Leukocytes, UA TRACE (*)    All other components within normal limits  URINE MICROSCOPIC-ADD ON - Abnormal; Notable for the following:    Squamous Epithelial / LPF 0-5 (*)    Bacteria, UA RARE (*)    All other components within normal limits  LIPASE, BLOOD    EKG  EKG Interpretation None       Radiology Dg Ankle Complete Left  Result Date: 06/25/2016 CLINICAL DATA:  [place] 1 month ago, LEFT ankle pain began several days afterwards. Pain now severe. EXAM: LEFT ANKLE COMPLETE - 3+ VIEW COMPARISON:  LEFT ankle radiograph Mar 01, 2004 FINDINGS: No fracture deformity nor dislocation. The ankle mortise appears congruent and the tibiofibular syndesmosis intact. No destructive bony lesions. Mild lateral ankle soft tissue swelling without subcutaneous gas or radiopaque foreign bodies. Small plantar calcaneal spur. IMPRESSION: Lateral ankle soft tissue swelling.  No acute osseous process. Electronically Signed   By: Elon Alas M.D.   On: 06/25/2016 22:14   Ct Abdomen Pelvis W Contrast  Result Date: 06/26/2016 CLINICAL DATA:  Acute onset of right lower quadrant abdominal pain. Recently treated for diverticulitis. Initial encounter. EXAM: CT ABDOMEN AND PELVIS WITH CONTRAST TECHNIQUE: Multidetector CT imaging of the abdomen and pelvis was performed using the standard protocol following bolus administration of intravenous contrast. CONTRAST:  170m ISOVUE-300 IOPAMIDOL (ISOVUE-300)  INJECTION 61% COMPARISON:  CT of the abdomen and pelvis from 10/15/2015 FINDINGS: Lower chest: The visualized lung bases are grossly clear. The visualized portions of the mediastinum are unremarkable. Hepatobiliary: There is a mildly nodular contour to the liver, raising  concern for hepatic cirrhosis. Corresponding prominent varices are seen tracking along the omentum, with mild splenic varices noted. The patient is status post cholecystectomy, with clips noted at the gallbladder fossa. The common bile duct remains normal in caliber. Pancreas: The pancreas is within normal limits. Spleen: The spleen is unremarkable in appearance. Adrenals/Urinary Tract: The adrenal glands are unremarkable in appearance. The kidneys are within normal limits. There is no evidence of hydronephrosis. No renal or ureteral stones are identified, though evaluation for renal stones is limited given contrast in the renal calyces. No perinephric stranding is seen. Stomach/Bowel: The stomach is unremarkable in appearance. The small bowel is within normal limits. The appendix is normal in caliber, without evidence of appendicitis. Mild diverticulosis is noted along the distal descending and sigmoid colon, without evidence of diverticulitis. Vascular/Lymphatic: Scattered calcification is seen along the abdominal aorta and its branches. The abdominal aorta is otherwise grossly unremarkable. The inferior vena cava is grossly unremarkable. No retroperitoneal lymphadenopathy is seen. No pelvic sidewall lymphadenopathy is identified. Reproductive: Mild contrast is noted within the bladder. The patient is status post hysterectomy. The right ovary is asymmetrically enlarged, measuring 4.6 x 4.2 cm. The left ovary is unremarkable in appearance. Other: An anterior abdominal wall mesh is noted at the umbilicus, with underlying chronic soft tissue inflammation and prominent vasculature. There is mild bulging of the mesh, without evidence of recurrent  hernia. Musculoskeletal: No acute osseous abnormalities are identified. Mild facet disease is noted at the lower lumbar spine. The visualized musculature is unremarkable in appearance. IMPRESSION: 1. No acute abnormality seen to explain the patient's symptoms. 2. Findings of hepatic cirrhosis, with mild splenic varices. 3. Mild diverticulosis along the distal descending and sigmoid colon, without evidence of diverticulitis. 4. Chronic soft tissue inflammation noted at the patient's anterior abdominal wall mesh about the umbilicus, with associated prominent vasculature. No evidence of significant recurrent hernia. 5. **An incidental finding of potential clinical significance has been found. 4.6 x 4.2 cm enlarged right ovary noted; this has increased gradually in prominence over the past year, and an underlying mass cannot be excluded. Pelvic ultrasound is recommended for further evaluation, when and as deemed clinically appropriate.** Electronically Signed   By: Garald Balding M.D.   On: 06/26/2016 00:31    Procedures Procedures (including critical care time)  Medications Ordered in ED Medications  sodium chloride 0.9 % bolus 1,000 mL (1,000 mLs Intravenous New Bag/Given 06/25/16 2213)  ondansetron (ZOFRAN) injection 4 mg (4 mg Intravenous Given 06/25/16 2213)  morphine 4 MG/ML injection 4 mg (4 mg Intravenous Given 06/25/16 2213)  iopamidol (ISOVUE-300) 61 % injection 100 mL (100 mLs Intravenous Contrast Given 06/25/16 2311)  diatrizoate meglumine-sodium (GASTROGRAFIN) 66-10 % solution (  Given 06/25/16 2311)  morphine 4 MG/ML injection 4 mg (4 mg Intravenous Given 06/26/16 0045)     Initial Impression / Assessment and Plan / ED Course  I have reviewed the triage vital signs and the nursing notes.  Pertinent labs & imaging results that were available during my care of the patient were reviewed by me and considered in my medical decision making (see chart for details).  Clinical Course    Labs  reviewed.  CT abd/pelvis results concerning for right ovarian mass.  I have discussed this with the patient and scheduled an outpatient pelvic US for today, 06/26/16 at 10:30 am.  Pt will then follow up with her GYN in [place].  Pt has hx of anemia and she is aware of importance of iron,  but does not take it. Rx written     She is feeling better and appears stable for d/c.  Offered ASO for left ankle, but states she has one at home.    Final Clinical Impressions(s) / ED Diagnoses   Final diagnoses:  RLQ abdominal pain  Mass of right ovary  Ankle sprain, left, initial encounter  Anemia, unspecified anemia type    New Prescriptions New Prescriptions   No medications on file     Bufford Lope 06/26/16 0119    Milton Ferguson, MD 06/27/16 1500

## 2016-06-26 NOTE — Discharge Instructions (Signed)
You have an appt in the morning at 10:30 am for an ultrasound of your pelvis.  Try to arrive 15-20 minutes early to register and you will need to drink 32 oz of water 1 hr before your appt and do not urinate before arrival.  Also, follow-up with your GYN doctor regarding your test results.

## 2016-06-27 ENCOUNTER — Other Ambulatory Visit: Payer: Self-pay | Admitting: "Endocrinology

## 2016-06-27 DIAGNOSIS — E1165 Type 2 diabetes mellitus with hyperglycemia: Secondary | ICD-10-CM

## 2016-06-27 DIAGNOSIS — E1169 Type 2 diabetes mellitus with other specified complication: Principal | ICD-10-CM

## 2016-06-27 DIAGNOSIS — IMO0002 Reserved for concepts with insufficient information to code with codable children: Secondary | ICD-10-CM

## 2016-06-30 ENCOUNTER — Encounter: Payer: Self-pay | Admitting: Nutrition

## 2016-06-30 ENCOUNTER — Encounter: Payer: Medicare Other | Attending: "Endocrinology | Admitting: Nutrition

## 2016-06-30 ENCOUNTER — Ambulatory Visit (INDEPENDENT_AMBULATORY_CARE_PROVIDER_SITE_OTHER): Payer: Medicare Other | Admitting: Orthopedic Surgery

## 2016-06-30 ENCOUNTER — Telehealth: Payer: Self-pay | Admitting: Nutrition

## 2016-06-30 ENCOUNTER — Other Ambulatory Visit: Payer: Self-pay | Admitting: "Endocrinology

## 2016-06-30 ENCOUNTER — Encounter: Payer: Self-pay | Admitting: Orthopedic Surgery

## 2016-06-30 VITALS — BP 139/89 | HR 99 | Wt 190.0 lb

## 2016-06-30 VITALS — Ht 61.5 in | Wt 190.8 lb

## 2016-06-30 DIAGNOSIS — Z6836 Body mass index (BMI) 36.0-36.9, adult: Secondary | ICD-10-CM | POA: Diagnosis not present

## 2016-06-30 DIAGNOSIS — Z794 Long term (current) use of insulin: Secondary | ICD-10-CM | POA: Diagnosis not present

## 2016-06-30 DIAGNOSIS — E119 Type 2 diabetes mellitus without complications: Secondary | ICD-10-CM | POA: Diagnosis not present

## 2016-06-30 DIAGNOSIS — E118 Type 2 diabetes mellitus with unspecified complications: Secondary | ICD-10-CM | POA: Diagnosis not present

## 2016-06-30 DIAGNOSIS — E1165 Type 2 diabetes mellitus with hyperglycemia: Secondary | ICD-10-CM | POA: Diagnosis not present

## 2016-06-30 DIAGNOSIS — Z713 Dietary counseling and surveillance: Secondary | ICD-10-CM | POA: Diagnosis not present

## 2016-06-30 DIAGNOSIS — S93401A Sprain of unspecified ligament of right ankle, initial encounter: Secondary | ICD-10-CM | POA: Diagnosis not present

## 2016-06-30 DIAGNOSIS — IMO0002 Reserved for concepts with insufficient information to code with codable children: Secondary | ICD-10-CM

## 2016-06-30 DIAGNOSIS — E669 Obesity, unspecified: Secondary | ICD-10-CM

## 2016-06-30 LAB — HEMOGLOBIN A1C
HEMOGLOBIN A1C: 7.5 % — AB (ref <5.7)
MEAN PLASMA GLUCOSE: 169 mg/dL

## 2016-06-30 LAB — COMPLETE METABOLIC PANEL WITH GFR
ALBUMIN: 3.6 g/dL (ref 3.6–5.1)
ALK PHOS: 85 U/L (ref 33–130)
ALT: 18 U/L (ref 6–29)
AST: 36 U/L — AB (ref 10–35)
BUN: 10 mg/dL (ref 7–25)
CALCIUM: 9.3 mg/dL (ref 8.6–10.4)
CO2: 23 mmol/L (ref 20–31)
Chloride: 106 mmol/L (ref 98–110)
Creat: 0.68 mg/dL (ref 0.50–1.05)
GFR, Est Non African American: >89 mL/min (ref >=60)
Glucose, Bld: 100 mg/dL — ABNORMAL HIGH (ref 65–99)
POTASSIUM: 3.9 mmol/L (ref 3.5–5.3)
Sodium: 139 mmol/L (ref 135–146)
Total Bilirubin: 0.6 mg/dL (ref 0.2–1.2)
Total Protein: 7.8 g/dL (ref 6.1–8.1)

## 2016-06-30 LAB — LIPID PANEL
CHOLESTEROL: 181 mg/dL (ref 125–200)
HDL: 33 mg/dL — ABNORMAL LOW (ref >=46)
LDL CALC: 119 mg/dL (ref <130)
Total CHOL/HDL Ratio: 5.5 Ratio — ABNORMAL HIGH (ref <=5.0)
Triglycerides: 146 mg/dL (ref <150)
VLDL: 29 mg/dL (ref <30)

## 2016-06-30 LAB — T4, FREE: FREE T4: 0.9 ng/dL (ref 0.8–1.8)

## 2016-06-30 LAB — TSH: TSH: 3.36 mIU/L

## 2016-06-30 NOTE — Progress Notes (Addendum)
d Diabetes Self-Management Education  Visit Type:  Follow-up  Appt. Start Time:11009/25/2017  [name], identified by name and date of birth, is a 55 y.o. female with a diagnosis of Diabetes:  Marland Kitchen Going to get lab work done after this visit.  Sees Dr. Dorris Fetch next week or so. Was testing blood sugars with expired strips. Has a True Result meter to test blood sugars that Medicaid covers cost of strips. Testing 4 times per day. BS up and Down. Recently told she has a 'cookie' size mass on her ovary causing some abdominal pain. Lost a family member and having difficulity coping. Wants to go see [facility] for counseling, number given.. Will see Dr. Evie Lacks for follow up on ovary mass in 2 weeks 07/16/16.   ASSESSMENT  Height 5' 1.5" (1.562 m), weight 190 lb 12.8 oz (86.5 kg). Body mass index is 35.47 kg/m.       Diabetes Self-Management Education - 06/30/16 1212      Health Coping   How would you rate your overall health? Good     Dietary Intake   Breakfast  Scr egg, toast, fruit and skim milk   Lunch ham,  green beans, and  mashed potaotes and fruit, Water   Dinner Taco-beef 1, skim milk and half banana   Snack (evening)  PB and gram cracker, or apple   Beverage(s) water, milk,      Exercise   Exercise Type Light (walking / raking leaves);Other (comment)   walking some due to boot of foot     Patient Education   Disease state  Explored patient's options for treatment of their diabetes   Monitoring Taught/evaluated SMBG meter.   Had expired strips   Psychosocial adjustment Identified and addressed patients feelings and concerns about diabetes   Personal strategies to promote health Lifestyle issues that need to be addressed for better diabetes care     Individualized Goals (developed by patient)   Physical Activity --  when released from Dr. Aline Brochure   Medications take my medication as prescribed   Monitoring  test my blood glucose as discussed   Problem Solving dont skip  meals   Reducing Risk increase portions of healthy fats;get labs drawn     Patient Self-Evaluation of Goals - Patient rates self as meeting previously set goals (% of time)   Nutrition >75%   Physical Activity < 25%   Medications >75%   Monitoring >75%   Problem Solving >75%   Reducing Risk >75%   Health Coping >75%     Post-Education Assessment   Patient understands the diabetes disease and treatment process. Demonstrates understanding / competency   Patient understands incorporating nutritional management into lifestyle. Demonstrates understanding / competency   Patient undertands incorporating physical activity into lifestyle. Demonstrates understanding / competency   Patient understands using medications safely. Demonstrates understanding / competency   Patient understands monitoring blood glucose, interpreting and using results Demonstrates understanding / competency   Patient understands prevention, detection, and treatment of acute complications. Demonstrates understanding / competency   Patient understands prevention, detection, and treatment of chronic complications. Demonstrates understanding / competency   Patient understands how to develop strategies to address psychosocial issues. Demonstrates understanding / competency   Patient understands how to develop strategies to promote health/change behavior. Demonstrates understanding / competency     Outcomes   Program Status Completed      Learning Objective:  Patient will have a greater understanding of diabetes self-management. Patient education plan is to  attend individual and/or group sessions per assessed needs and concerns.   Plan:   Patient Instructions  1. Call [facility] and schedule appt to speak with counselor 2. Call Dr. Evie Lacks office and talk to nurse about concerns. 3. Eat 2-3 cabs per meal. Increase low carb vegetables. Get A1C to 7% or less Lose 1 lb per week    Expected Outcomes:     Education  material provided: Food label handouts, A1C conversion sheet and My Plate  If problems or questions, patient to contact team via:  Phone and Email  Future DSME appointment: - 3-4 months

## 2016-06-30 NOTE — Telephone Encounter (Signed)
Noted  

## 2016-06-30 NOTE — Progress Notes (Signed)
Patient ID: [name], female   DOB: December 12, 1960, 55 y.o.   MRN: [id number]  Chief Complaint  Patient presents with  . Follow-up    RIGHT ANKLE ~ 05-15-2016    HPI NICOLA HEINEMANN is a 55 y.o. female.   HPI  Status post right ankle injury. Had a lateral process of the calcaneus fracture. Still complains of pain subtalar area  Review of Systems Review of Systems  Neurological: Negative for numbness.    Physical Exam BP 139/89   Pulse 99   Wt 190 lb (86.2 kg)   BMI 35.32 kg/m    Tenderness in the subtalar area. She ambulates with a walking boot.Physical Exam  Constitutional: She is oriented to person, place, and time. She appears well-developed and well-nourished. No distress.  Cardiovascular: Normal rate and intact distal pulses.   Neurological: She is alert and oriented to person, place, and time. She has normal reflexes. She exhibits normal muscle tone. Coordination normal.  Skin: Skin is warm and dry. No rash noted. She is not diaphoretic. No erythema. No pallor.  Psychiatric: She has a normal mood and affect. Her behavior is normal. Judgment and thought content normal.   No instability on the drawer test. The opposite foot shows similar subtalar region adipose tissue.  Inject subtalar joint  A steroid injection was performed at RT SUBTALAR AREA  using 1% plain Lidocaine and 40  mg of DEPOMEDROL . This was well tolerated.  COME BACK 4 WEEKS

## 2016-06-30 NOTE — Telephone Encounter (Signed)
TC from pt. She went to see Dr. Aline Brochure after visit with me today. He gave her a cortisone shot in her ankle today and wanted myself and Dr. Dorris Fetch to know. Expect BS to be elevated for a few days. Encouraged her to document that on her BS log for today so it will reflect on her BS log and BS readings. She verbalized understanding.   She will have the boot on her foot for another 2 weeks and then follow back up with Dr. Aline Brochure.Marland Kitchen

## 2016-06-30 NOTE — Patient Instructions (Addendum)
1. Call [facility] and schedule appt to speak with counselor 2. Call Dr. Evie Lacks office and talk to nurse about concerns. 3. Eat 2-3 cabs per meal. Increase low carb vegetables. Get A1C to 7% or less Lose 1 lb per week

## 2016-06-30 NOTE — Patient Instructions (Addendum)
You have received an injection of steroids into the joint. 15% of patients will have increased pain within the 24 hours postinjection.   This is transient and will go away.   We recommend that you use ice packs on the injection site for 20 minutes every 2 hours and extra strength Tylenol 2 tablets every 8 as needed until the pain resolves.  If you continue to have pain after taking the Tylenol and using the ice please call the office for further instructions.   CONTINUE WALKING BOOT    RET 4 WEEKS

## 2016-07-01 ENCOUNTER — Other Ambulatory Visit: Payer: Self-pay

## 2016-07-01 LAB — MICROALBUMIN / CREATININE URINE RATIO
CREATININE, URINE: 262 mg/dL (ref 20–320)
MICROALB UR: 1.2 mg/dL
Microalb Creat Ratio: 5 mcg/mg creat (ref <30)

## 2016-07-01 MED ORDER — GLUCOSE BLOOD VI STRP: ORAL_STRIP | 5 refills | Status: DC

## 2016-07-07 DIAGNOSIS — E119 Type 2 diabetes mellitus without complications: Secondary | ICD-10-CM | POA: Diagnosis not present

## 2016-07-07 DIAGNOSIS — I10 Essential (primary) hypertension: Secondary | ICD-10-CM | POA: Diagnosis not present

## 2016-07-07 DIAGNOSIS — M159 Polyosteoarthritis, unspecified: Secondary | ICD-10-CM | POA: Diagnosis not present

## 2016-07-07 DIAGNOSIS — N839 Noninflammatory disorder of ovary, fallopian tube and broad ligament, unspecified: Secondary | ICD-10-CM | POA: Diagnosis not present

## 2016-07-09 DIAGNOSIS — R109 Unspecified abdominal pain: Secondary | ICD-10-CM | POA: Diagnosis not present

## 2016-07-09 DIAGNOSIS — R1031 Right lower quadrant pain: Secondary | ICD-10-CM | POA: Diagnosis not present

## 2016-07-09 DIAGNOSIS — Z6834 Body mass index (BMI) 34.0-34.9, adult: Secondary | ICD-10-CM | POA: Diagnosis not present

## 2016-07-09 DIAGNOSIS — Z713 Dietary counseling and surveillance: Secondary | ICD-10-CM | POA: Diagnosis not present

## 2016-07-10 ENCOUNTER — Ambulatory Visit: Payer: Medicare Other | Admitting: "Endocrinology

## 2016-07-15 ENCOUNTER — Encounter: Payer: Self-pay | Admitting: "Endocrinology

## 2016-07-15 ENCOUNTER — Ambulatory Visit (INDEPENDENT_AMBULATORY_CARE_PROVIDER_SITE_OTHER): Payer: Medicare Other | Admitting: "Endocrinology

## 2016-07-15 VITALS — BP 118/78 | HR 84 | Ht 61.0 in | Wt 191.0 lb

## 2016-07-15 DIAGNOSIS — E7800 Pure hypercholesterolemia, unspecified: Secondary | ICD-10-CM | POA: Insufficient documentation

## 2016-07-15 DIAGNOSIS — E118 Type 2 diabetes mellitus with unspecified complications: Secondary | ICD-10-CM | POA: Diagnosis not present

## 2016-07-15 DIAGNOSIS — IMO0002 Reserved for concepts with insufficient information to code with codable children: Secondary | ICD-10-CM

## 2016-07-15 DIAGNOSIS — Z794 Long term (current) use of insulin: Secondary | ICD-10-CM | POA: Diagnosis not present

## 2016-07-15 DIAGNOSIS — I10 Essential (primary) hypertension: Secondary | ICD-10-CM

## 2016-07-15 DIAGNOSIS — E1165 Type 2 diabetes mellitus with hyperglycemia: Secondary | ICD-10-CM

## 2016-07-15 MED ORDER — ATORVASTATIN CALCIUM 20 MG PO TABS: 20.0000 mg | ORAL_TABLET | Freq: Every day | ORAL | 3 refills | Status: DC

## 2016-07-15 MED ORDER — METFORMIN HCL 1000 MG PO TABS: 1000.0000 mg | ORAL_TABLET | Freq: Two times a day (BID) | ORAL | 2 refills | Status: DC

## 2016-07-15 NOTE — Patient Instructions (Signed)

## 2016-07-15 NOTE — Progress Notes (Signed)
Subjective:    Patient ID: [name], female    DOB: [birth year]. Patient is being seen in consultation for management of diabetes requested by  Glenda Chroman, MD  Past Medical History:  Diagnosis Date  . Anxiety disorder   . Chronic low back pain   . Colitis   . Diabetes mellitus   . Diabetic neuropathy ([place])   . Diverticulitis   . Fatty liver   . GERD (gastroesophageal reflux disease)   . Hypertension   . Irritable bowel syndrome   . Meniere's disease   . Pain    Past Surgical History:  Procedure Laterality Date  . ABDOMINAL HYSTERECTOMY    . bladder tact  2005  . CHOLECYSTECTOMY  08/11  . COLONOSCOPY  2208  . COLONOSCOPY  In of September 2014   @ [facility]/Dr,.[name]  . [place]  . HERNIA REPAIR  09/17/11  . [place]  2009   Preformed on the right shoulder  . PARTIAL HYSTERECTOMY  2005  . UPPER GASTROINTESTINAL ENDOSCOPY  2008  . URETERAL STENT PLACEMENT     Social History   Social History  . Marital status: Divorced    Spouse name: N/A  . Number of children: N/A  . Years of education: N/A   Social History Main Topics  . Smoking status: Never Smoker  . Smokeless tobacco: Never Used  . Alcohol use No  . Drug use: No  . Sexual activity: Yes    Birth control/ protection: Surgical   Other Topics Concern  . None   Social History Narrative  . None   Outpatient Encounter Prescriptions as of 07/15/2016  Medication Sig  . acetaminophen (TYLENOL) 500 MG tablet Take 500 mg by mouth every 6 (six) hours as needed for mild pain.  Marland Kitchen acetaZOLAMIDE (DIAMOX) 250 MG tablet Take 250 mg by mouth daily.   Marland Kitchen acyclovir (ZOVIRAX) 400 MG tablet Take 1 tablet by mouth 3 (three) times daily as needed (fever blisters).   Marland Kitchen atorvastatin (LIPITOR) 20 MG tablet Take 1 tablet (20 mg total) by mouth daily.  . cyanocobalamin (,VITAMIN B-12,) 1000 MCG/ML injection Inject 1,000 mcg into the muscle every 30 (thirty) days.  . diazepam (VALIUM) 5 MG tablet Take 2.5  mg by mouth every 12 (twelve) hours as needed for anxiety.   . DULoxetine (CYMBALTA) 60 MG capsule Take 60 mg by mouth daily.  . ferrous sulfate 325 (65 FE) MG tablet Take 1 tablet (325 mg total) by mouth daily.  . fluticasone (FLONASE) 50 MCG/ACT nasal spray Place 2 sprays into both nostrils every morning.   Marland Kitchen glucose blood test strip Use as instructed 4 times daily. E11.65. TrueTrack test strips  . HYDROcodone-acetaminophen (NORCO/VICODIN) 5-325 MG tablet Take 1 tablet by mouth every 6 (six) hours as needed. (Patient taking differently: Take 1 tablet by mouth every 6 (six) hours as needed for moderate pain. )  . ibuprofen (ADVIL,MOTRIN) 600 MG tablet Take 1 tablet (600 mg total) by mouth every 6 (six) hours as needed. (Patient taking differently: Take 600 mg by mouth every 6 (six) hours as needed for moderate pain. )  . Meclizine HCl 25 MG CHEW Chew 25 mg by mouth 2 (two) times daily as needed (dizziness).   . metFORMIN (GLUCOPHAGE) 500 MG tablet Take 1 tablet (500 mg total) by mouth 2 (two) times daily with a meal.  . metoprolol (LOPRESSOR) 50 MG tablet Take 25 mg by mouth daily. Patient takes 1/2 tablet daily  .  ondansetron (ZOFRAN) 4 MG tablet Take 1 tablet by mouth 3 (three) times daily as needed for nausea/vomiting.  Marland Kitchen oxazepam (SERAX) 10 MG capsule Take 10 mg by mouth at bedtime.  . pantoprazole (PROTONIX) 40 MG tablet Take 40 mg by mouth daily.   . traMADol (ULTRAM) 50 MG tablet Take 50 mg by mouth 3 (three) times daily as needed for moderate pain or severe pain.   . TRESIBA FLEXTOUCH 200 UNIT/ML SOPN INJECT 50 UNITS SUBCUTANEOUSLY AT BEDTIME.  . [DISCONTINUED] NOVOLOG FLEXPEN 100 UNIT/ML FlexPen Inject 5-11 Units into the skin 3 (three) times daily with meals. (Patient taking differently: Inject 11-15 Units into the skin 3 (three) times daily with meals. )   No facility-administered encounter medications on file as of 07/15/2016.    ALLERGIES: Allergies  Allergen Reactions  . Flagyl  [Metronidazole Hcl] Itching   VACCINATION STATUS:  There is no immunization history on file for this patient.  Diabetes  She presents for her follow-up diabetic visit. She has type 2 diabetes mellitus. Onset time: She was diagnosed approximately at age 55 years. Her disease course has been improving. There are no hypoglycemic associated symptoms. Pertinent negatives for hypoglycemia include no confusion, headaches, pallor or seizures. Associated symptoms include fatigue, polydipsia and polyuria. Pertinent negatives for diabetes include no chest pain and no polyphagia. There are no hypoglycemic complications. Symptoms are improving. There are no diabetic complications. Risk factors for coronary artery disease include diabetes mellitus, dyslipidemia, hypertension, obesity, sedentary lifestyle and family history. Current diabetic treatment includes oral agent (monotherapy) and intensive insulin program (She is on Tresiba 130 units every morning, NovoLog 15 units 3 times a day before meals, Glucovance 5/500 3 times a day, Actos 15 mg by mouth daily.). Her weight is decreasing steadily. She is following a generally unhealthy diet. When asked about meal planning, she reported none. She has not had a previous visit with a dietitian. She never participates in exercise. Her home blood glucose trend is fluctuating dramatically. Her breakfast blood glucose range is generally 140-180 mg/dl. Her lunch blood glucose range is generally 140-180 mg/dl. Her dinner blood glucose range is generally 140-180 mg/dl. Her overall blood glucose range is 140-180 mg/dl. An ACE inhibitor/angiotensin II receptor blocker is not being taken. Eye exam is current.  Hypertension  This is a chronic problem. The current episode started more than 1 year ago. The problem is controlled. Pertinent negatives include no chest pain, headaches, palpitations or shortness of breath. Risk factors for coronary artery disease include diabetes mellitus,  obesity and sedentary lifestyle. Past treatments include beta blockers. Compliance problems include diet and exercise.   Hyperlipidemia  Pertinent negatives include no chest pain, myalgias or shortness of breath.     Review of Systems  Constitutional: Positive for fatigue. Negative for unexpected weight change.  HENT: Negative for trouble swallowing and voice change.   Eyes: Negative for visual disturbance.  Respiratory: Negative for cough, shortness of breath and wheezing.   Cardiovascular: Negative for chest pain, palpitations and leg swelling.  Gastrointestinal: Negative for diarrhea, nausea and vomiting.  Endocrine: Positive for polydipsia and polyuria. Negative for cold intolerance, heat intolerance and polyphagia.  Genitourinary: Positive for frequency. Negative for dysuria and flank pain.  Musculoskeletal: Negative for arthralgias and myalgias.  Skin: Negative for color change, pallor, rash and wound.  Neurological: Negative for seizures and headaches.  Psychiatric/Behavioral: Negative for confusion and suicidal ideas.    Objective:    BP 118/78   Pulse 84   Ht 5'  1" (1.549 m)   Wt 191 lb (86.6 kg)   BMI 36.09 kg/m   Wt Readings from Last 3 Encounters:  07/15/16 191 lb (86.6 kg)  06/30/16 190 lb (86.2 kg)  06/30/16 190 lb 12.8 oz (86.5 kg)    Physical Exam  Constitutional: She is oriented to person, place, and time. She appears well-developed.  Obese.  HENT:  Head: Normocephalic and atraumatic.  Eyes: EOM are normal.  Neck: Normal range of motion. Neck supple. No tracheal deviation present. No thyromegaly present.  Cardiovascular: Normal rate and regular rhythm.   Pulmonary/Chest: Effort normal and breath sounds normal.  Abdominal: Soft. Bowel sounds are normal. There is no tenderness. There is no guarding.  Musculoskeletal: Normal range of motion. She exhibits no edema.  Neurological: She is alert and oriented to person, place, and time. She has normal reflexes.  No cranial nerve deficit. Coordination normal.  Skin: Skin is warm and dry. No rash noted. No erythema. No pallor.  Psychiatric: She has a normal mood and affect. Judgment normal.     CMP     Component Value Date/Time   NA 139 06/30/2016 1251   K 3.9 06/30/2016 1251   CL 106 06/30/2016 1251   CO2 23 06/30/2016 1251   GLUCOSE 100 (H) 06/30/2016 1251   BUN 10 06/30/2016 1251   CREATININE 0.68 06/30/2016 1251   CALCIUM 9.3 06/30/2016 1251   PROT 7.8 06/30/2016 1251   ALBUMIN 3.6 06/30/2016 1251   AST 36 (H) 06/30/2016 1251   ALT 18 06/30/2016 1251   ALKPHOS 85 06/30/2016 1251   BILITOT 0.6 06/30/2016 1251   GFRNONAA >89 06/30/2016 1251   GFRAA >89 06/30/2016 1251     Diabetic Labs (most recent): Lab Results  Component Value Date   HGBA1C 7.5 (H) 06/30/2016   HGBA1C 8.5 02/13/2016   HGBA1C 8.4 07/26/2015     Assessment & Plan:   1. Uncontrolled type 2 diabetes mellitus with complication, with long-term current use of insulin ([place]) - Patient has currently uncontrolled symptomatic type 2 DM since  55 years of age. - She came with improved blood glucose profile, A1c improving to 7.5% from 8.7%.  Recent labs reviewed, showing hyperlipidemia and normal renal function.   Her diabetes is complicated by obesity, insulin resistance, and patient remains at a high risk for more acute and chronic complications of diabetes which include CAD, CVA, CKD, retinopathy, and neuropathy. These are all discussed in detail with the patient.  - I have counseled the patient on diet management and weight loss, by adopting a carbohydrate restricted/protein rich diet.  - Suggestion is made for patient to avoid simple carbohydrates   from their diet including Cakes , Desserts, Ice Cream,  Soda (  diet and regular) , Sweet Tea , Candies,  Chips, Cookies, Artificial Sweeteners,   and "Sugar-free" Products . This will help patient to have stable blood glucose profile and potentially avoid unintended  weight gain.  - I encouraged the patient to switch to  unprocessed or minimally processed complex starch and increased protein intake (animal or plant source), fruits, and vegetables.  - Patient is advised to stick to a routine mealtimes to eat 3 meals  a day and avoid unnecessary snacks ( to snack only to correct hypoglycemia).  - The patient will be scheduled with Jearld Fenton, RDN, CDE for individualized DM education.  - I have approached patient with the following individualized plan to manage diabetes and patient agrees:   -  She  returns with  blood glucose profile fluctuating significantly with some prayer random postprandial hypoglycemia but consistently near or above target fasting glucose levels.  - I  will continue basal insulinTresiba  50 units QHS ( previously took 130 units of basal insulin and 45 units of prandial insulin daily), and hold prandial insulin for now, and continue monitoring of glucose  AC  breakfast  and HS. - Patient is warned not to take insulin without proper monitoring per orders. -Adjustment parameters are given for hypo and hyperglycemia in writing. -Patient is encouraged to call clinic for blood glucose levels less than 70 or above 200 mg /dl. - I will increase metformin to 1000 mg by mouth twice a day ,therapeutically suitable for patient.  -  She has history of intolerance for [place], likely would not tolerate other  incretin therapy.  - Patient specific target  A1c;  LDL, HDL, Triglycerides, and  Waist Circumference were discussed in detail.  2) BP/HTN:  controlled. Continue current medications . 3) Lipids/HPL:   Uncontrolled, LDL 141, I discussed and initiated atorvastatin 20 mg by mouth daily at bedtime. Side effects and precautions discussed with her.  4)  Weight/Diet: CDE Consult has been initiated , she has lost 6 pounds since last visit, exercise, and detailed carbohydrates information provided.  5) Chronic Care/Health Maintenance:  -Patient   will be considered for ACE inhibitor's and statins as appropriate, and encouraged to continue to follow up with Ophthalmology, Podiatrist at least yearly or according to recommendations, and advised to   stay away from smoking. I have recommended yearly flu vaccine and pneumonia vaccination at least every 5 years; moderate intensity exercise for up to 150 minutes weekly; and  sleep for at least 7 hours a day.  - 25 minutes of time was spent on the care of this patient , 50% of which was applied for counseling on diabetes complications and their preventions.  - Patient to bring meter and  blood glucose logs during their next visit.   - I advised patient to maintain close follow up with Glenda Chroman, MD for primary care needs.  Follow up plan: - Return in about 3 months (around 10/15/2016) for meter, and logs.  Glade Lloyd, MD Phone: [phone]  Fax: [phone]   07/15/2016, 2:15 PM

## 2016-07-22 ENCOUNTER — Encounter: Payer: Self-pay | Admitting: Orthopedic Surgery

## 2016-07-22 ENCOUNTER — Ambulatory Visit (INDEPENDENT_AMBULATORY_CARE_PROVIDER_SITE_OTHER): Payer: Medicare Other | Admitting: Orthopedic Surgery

## 2016-07-22 DIAGNOSIS — M25571 Pain in right ankle and joints of right foot: Secondary | ICD-10-CM

## 2016-07-22 NOTE — Progress Notes (Signed)
Follow-up visit  Injured right ankle lateral process calcaneus fracture pain subtalar area gave injection  Still has lateral ankle pain mild improvement with the injection and also complains of left foot pain no trauma pain over the dorsal lateral aspect of the foot  Past Medical History:  Diagnosis Date  . Anxiety disorder   . Chronic low back pain   . Colitis   . Diabetes mellitus   . Diabetic neuropathy ([place])   . Diverticulitis   . Fatty liver   . GERD (gastroesophageal reflux disease)   . Hypertension   . Irritable bowel syndrome   . Meniere's disease   . Pain     System review no numbness or tingling  Continued pain after lateral process calcaneus fracture  Recommend foot orthotics  If no improvement after 6 weeks recommend CT scan reexamine fracture for healing and/or undiagnosed fracture

## 2016-07-23 ENCOUNTER — Telehealth: Payer: Self-pay | Admitting: Orthopedic Surgery

## 2016-07-23 NOTE — Telephone Encounter (Signed)
Patient wants to speak to you regarding getting a foot orthotic

## 2016-07-23 NOTE — Telephone Encounter (Signed)
Patient states orthotics not covered by insurance and she cannot afford them... $35 Would like to know if there is another suggestion?

## 2016-07-23 NOTE — Telephone Encounter (Signed)
Called patient, no answer 

## 2016-07-24 NOTE — Telephone Encounter (Signed)
[facility] MAKE AN OTC VERSION BUT NOT AS GOOD

## 2016-07-24 NOTE — Telephone Encounter (Signed)
Patient aware.

## 2016-07-25 DIAGNOSIS — K219 Gastro-esophageal reflux disease without esophagitis: Secondary | ICD-10-CM | POA: Diagnosis not present

## 2016-07-25 DIAGNOSIS — F419 Anxiety disorder, unspecified: Secondary | ICD-10-CM | POA: Diagnosis not present

## 2016-07-25 DIAGNOSIS — E119 Type 2 diabetes mellitus without complications: Secondary | ICD-10-CM | POA: Diagnosis not present

## 2016-07-25 DIAGNOSIS — H8109 Meniere's disease, unspecified ear: Secondary | ICD-10-CM | POA: Diagnosis not present

## 2016-07-25 DIAGNOSIS — N83201 Unspecified ovarian cyst, right side: Secondary | ICD-10-CM | POA: Diagnosis not present

## 2016-07-25 DIAGNOSIS — K5792 Diverticulitis of intestine, part unspecified, without perforation or abscess without bleeding: Secondary | ICD-10-CM | POA: Diagnosis not present

## 2016-07-25 DIAGNOSIS — G629 Polyneuropathy, unspecified: Secondary | ICD-10-CM | POA: Diagnosis not present

## 2016-07-25 DIAGNOSIS — I10 Essential (primary) hypertension: Secondary | ICD-10-CM | POA: Diagnosis not present

## 2016-07-25 DIAGNOSIS — N200 Calculus of kidney: Secondary | ICD-10-CM | POA: Diagnosis not present

## 2016-07-25 DIAGNOSIS — K760 Fatty (change of) liver, not elsewhere classified: Secondary | ICD-10-CM | POA: Diagnosis not present

## 2016-07-28 ENCOUNTER — Ambulatory Visit: Payer: Medicare Other | Admitting: Orthopedic Surgery

## 2016-07-28 DIAGNOSIS — N83201 Unspecified ovarian cyst, right side: Secondary | ICD-10-CM | POA: Diagnosis not present

## 2016-07-28 DIAGNOSIS — N200 Calculus of kidney: Secondary | ICD-10-CM | POA: Diagnosis not present

## 2016-07-28 DIAGNOSIS — F419 Anxiety disorder, unspecified: Secondary | ICD-10-CM | POA: Diagnosis not present

## 2016-07-28 DIAGNOSIS — K5792 Diverticulitis of intestine, part unspecified, without perforation or abscess without bleeding: Secondary | ICD-10-CM | POA: Diagnosis not present

## 2016-07-28 DIAGNOSIS — G629 Polyneuropathy, unspecified: Secondary | ICD-10-CM | POA: Diagnosis not present

## 2016-07-28 DIAGNOSIS — K760 Fatty (change of) liver, not elsewhere classified: Secondary | ICD-10-CM | POA: Diagnosis not present

## 2016-07-28 DIAGNOSIS — E119 Type 2 diabetes mellitus without complications: Secondary | ICD-10-CM | POA: Diagnosis not present

## 2016-07-28 DIAGNOSIS — H8109 Meniere's disease, unspecified ear: Secondary | ICD-10-CM | POA: Diagnosis not present

## 2016-07-28 DIAGNOSIS — D270 Benign neoplasm of right ovary: Secondary | ICD-10-CM | POA: Diagnosis not present

## 2016-07-28 DIAGNOSIS — K219 Gastro-esophageal reflux disease without esophagitis: Secondary | ICD-10-CM | POA: Diagnosis not present

## 2016-07-28 DIAGNOSIS — I10 Essential (primary) hypertension: Secondary | ICD-10-CM | POA: Diagnosis not present

## 2016-07-29 DIAGNOSIS — K760 Fatty (change of) liver, not elsewhere classified: Secondary | ICD-10-CM | POA: Diagnosis not present

## 2016-07-29 DIAGNOSIS — G629 Polyneuropathy, unspecified: Secondary | ICD-10-CM | POA: Diagnosis not present

## 2016-07-29 DIAGNOSIS — I10 Essential (primary) hypertension: Secondary | ICD-10-CM | POA: Diagnosis not present

## 2016-07-29 DIAGNOSIS — K219 Gastro-esophageal reflux disease without esophagitis: Secondary | ICD-10-CM | POA: Diagnosis not present

## 2016-07-29 DIAGNOSIS — E119 Type 2 diabetes mellitus without complications: Secondary | ICD-10-CM | POA: Diagnosis not present

## 2016-07-29 DIAGNOSIS — F419 Anxiety disorder, unspecified: Secondary | ICD-10-CM | POA: Diagnosis not present

## 2016-07-29 DIAGNOSIS — Z23 Encounter for immunization: Secondary | ICD-10-CM | POA: Diagnosis not present

## 2016-07-29 DIAGNOSIS — N200 Calculus of kidney: Secondary | ICD-10-CM | POA: Diagnosis not present

## 2016-07-29 DIAGNOSIS — K5792 Diverticulitis of intestine, part unspecified, without perforation or abscess without bleeding: Secondary | ICD-10-CM | POA: Diagnosis not present

## 2016-07-29 DIAGNOSIS — H8109 Meniere's disease, unspecified ear: Secondary | ICD-10-CM | POA: Diagnosis not present

## 2016-07-29 DIAGNOSIS — N83201 Unspecified ovarian cyst, right side: Secondary | ICD-10-CM | POA: Diagnosis not present

## 2016-07-31 DIAGNOSIS — Z48816 Encounter for surgical aftercare following surgery on the genitourinary system: Secondary | ICD-10-CM | POA: Diagnosis not present

## 2016-07-31 DIAGNOSIS — F419 Anxiety disorder, unspecified: Secondary | ICD-10-CM | POA: Diagnosis not present

## 2016-07-31 DIAGNOSIS — H8109 Meniere's disease, unspecified ear: Secondary | ICD-10-CM | POA: Diagnosis not present

## 2016-07-31 DIAGNOSIS — I10 Essential (primary) hypertension: Secondary | ICD-10-CM | POA: Diagnosis not present

## 2016-07-31 DIAGNOSIS — K760 Fatty (change of) liver, not elsewhere classified: Secondary | ICD-10-CM | POA: Diagnosis not present

## 2016-07-31 DIAGNOSIS — G629 Polyneuropathy, unspecified: Secondary | ICD-10-CM | POA: Diagnosis not present

## 2016-07-31 DIAGNOSIS — Z6837 Body mass index (BMI) 37.0-37.9, adult: Secondary | ICD-10-CM | POA: Diagnosis not present

## 2016-07-31 DIAGNOSIS — E119 Type 2 diabetes mellitus without complications: Secondary | ICD-10-CM | POA: Diagnosis not present

## 2016-07-31 DIAGNOSIS — Z5181 Encounter for therapeutic drug level monitoring: Secondary | ICD-10-CM | POA: Diagnosis not present

## 2016-07-31 DIAGNOSIS — Z794 Long term (current) use of insulin: Secondary | ICD-10-CM | POA: Diagnosis not present

## 2016-07-31 DIAGNOSIS — K219 Gastro-esophageal reflux disease without esophagitis: Secondary | ICD-10-CM | POA: Diagnosis not present

## 2016-07-31 DIAGNOSIS — N83291 Other ovarian cyst, right side: Secondary | ICD-10-CM | POA: Diagnosis not present

## 2016-07-31 DIAGNOSIS — E669 Obesity, unspecified: Secondary | ICD-10-CM | POA: Diagnosis not present

## 2016-08-03 DIAGNOSIS — B379 Candidiasis, unspecified: Secondary | ICD-10-CM | POA: Diagnosis not present

## 2016-08-03 DIAGNOSIS — G8918 Other acute postprocedural pain: Secondary | ICD-10-CM | POA: Diagnosis not present

## 2016-08-03 DIAGNOSIS — Z794 Long term (current) use of insulin: Secondary | ICD-10-CM | POA: Diagnosis not present

## 2016-08-03 DIAGNOSIS — I10 Essential (primary) hypertension: Secondary | ICD-10-CM | POA: Diagnosis not present

## 2016-08-03 DIAGNOSIS — B372 Candidiasis of skin and nail: Secondary | ICD-10-CM | POA: Diagnosis not present

## 2016-08-03 DIAGNOSIS — E119 Type 2 diabetes mellitus without complications: Secondary | ICD-10-CM | POA: Diagnosis not present

## 2016-08-03 DIAGNOSIS — Z833 Family history of diabetes mellitus: Secondary | ICD-10-CM | POA: Diagnosis not present

## 2016-08-03 DIAGNOSIS — M1990 Unspecified osteoarthritis, unspecified site: Secondary | ICD-10-CM | POA: Diagnosis not present

## 2016-08-03 DIAGNOSIS — R1030 Lower abdominal pain, unspecified: Secondary | ICD-10-CM | POA: Diagnosis not present

## 2016-08-03 DIAGNOSIS — Z79899 Other long term (current) drug therapy: Secondary | ICD-10-CM | POA: Diagnosis not present

## 2016-08-04 DIAGNOSIS — Z6835 Body mass index (BMI) 35.0-35.9, adult: Secondary | ICD-10-CM | POA: Diagnosis not present

## 2016-08-04 DIAGNOSIS — N83291 Other ovarian cyst, right side: Secondary | ICD-10-CM | POA: Diagnosis not present

## 2016-08-04 DIAGNOSIS — I10 Essential (primary) hypertension: Secondary | ICD-10-CM | POA: Diagnosis not present

## 2016-08-04 DIAGNOSIS — E119 Type 2 diabetes mellitus without complications: Secondary | ICD-10-CM | POA: Diagnosis not present

## 2016-08-04 DIAGNOSIS — G629 Polyneuropathy, unspecified: Secondary | ICD-10-CM | POA: Diagnosis not present

## 2016-08-04 DIAGNOSIS — Z48816 Encounter for surgical aftercare following surgery on the genitourinary system: Secondary | ICD-10-CM | POA: Diagnosis not present

## 2016-08-04 DIAGNOSIS — Z01419 Encounter for gynecological examination (general) (routine) without abnormal findings: Secondary | ICD-10-CM | POA: Diagnosis not present

## 2016-08-04 DIAGNOSIS — Z1272 Encounter for screening for malignant neoplasm of vagina: Secondary | ICD-10-CM | POA: Diagnosis not present

## 2016-08-04 DIAGNOSIS — K760 Fatty (change of) liver, not elsewhere classified: Secondary | ICD-10-CM | POA: Diagnosis not present

## 2016-08-11 ENCOUNTER — Ambulatory Visit (INDEPENDENT_AMBULATORY_CARE_PROVIDER_SITE_OTHER): Payer: Medicare Other | Admitting: Internal Medicine

## 2016-08-11 ENCOUNTER — Other Ambulatory Visit (INDEPENDENT_AMBULATORY_CARE_PROVIDER_SITE_OTHER): Payer: Self-pay | Admitting: *Deleted

## 2016-08-11 ENCOUNTER — Encounter (INDEPENDENT_AMBULATORY_CARE_PROVIDER_SITE_OTHER): Payer: Self-pay | Admitting: Internal Medicine

## 2016-08-11 VITALS — BP 110/72 | HR 72 | Temp 98.1°F | Ht 61.0 in | Wt 187.3 lb

## 2016-08-11 DIAGNOSIS — Z48816 Encounter for surgical aftercare following surgery on the genitourinary system: Secondary | ICD-10-CM | POA: Diagnosis not present

## 2016-08-11 DIAGNOSIS — K760 Fatty (change of) liver, not elsewhere classified: Secondary | ICD-10-CM | POA: Diagnosis not present

## 2016-08-11 DIAGNOSIS — E119 Type 2 diabetes mellitus without complications: Secondary | ICD-10-CM | POA: Diagnosis not present

## 2016-08-11 DIAGNOSIS — N83291 Other ovarian cyst, right side: Secondary | ICD-10-CM | POA: Diagnosis not present

## 2016-08-11 DIAGNOSIS — D508 Other iron deficiency anemias: Secondary | ICD-10-CM

## 2016-08-11 DIAGNOSIS — I10 Essential (primary) hypertension: Secondary | ICD-10-CM | POA: Diagnosis not present

## 2016-08-11 DIAGNOSIS — G629 Polyneuropathy, unspecified: Secondary | ICD-10-CM | POA: Diagnosis not present

## 2016-08-11 NOTE — Progress Notes (Signed)
Subjective:    Patient ID: [name], female    DOB: 01/15/1961, 55 y.o.   MRN: [id number]  HPI Here today for f/u. Hx of elevated liver enzymes and NAFLD.  She tells me she is seeing a specialist for her diabetes (Dr. Dorris Fetch).  Her BS are running 126. She takes in the am and pm.  Her last weight was 186. Her weight today is 187.3.  She tells me she is exercising now.  Her appetite is good. She is trying walk.  She has a BM dailyx  Prior work up per Dr. Laural Golden notes was negative for Hepatitis B and C. Hepatitis A antibiody positive indicating previous exposure.  07/28/2007 Auto immune process ruled out  Hx of fatty liver and elevated transaminases She underwent rt oophorectomy 2 weeks ago for an enlarged rt ovary which was benign (Dr. Evie Lacks).   CBC    Component Value Date/Time   WBC 10.7 (H) 06/25/2016 2206   RBC 3.96 06/25/2016 2206   HGB 9.8 (L) 06/25/2016 2206   HCT 31.4 (L) 06/25/2016 2206   PLT 206 06/25/2016 2206   MCV 79.3 06/25/2016 2206   MCH 24.7 (L) 06/25/2016 2206   MCHC 31.2 06/25/2016 2206   RDW 18.1 (H) 06/25/2016 2206   LYMPHSABS 2.8 05/08/2015 1543   MONOABS 0.6 05/08/2015 1543   EOSABS 0.1 05/08/2015 1543   BASOSABS 0.0 05/08/2015 1543     Hepatic Function Latest Ref Rng & Units 06/30/2016 06/25/2016 05/08/2015  Total Protein 6.1 - 8.1 g/dL 7.8 7.6 8.1  Albumin 3.6 - 5.1 g/dL 3.6 3.3(L) 3.2(L)  AST 10 - 35 U/L 36(H) 37 118(H)  ALT 6 - 29 U/L 18 19 30(H)  Alk Phosphatase 33 - 130 U/L 85 73 115  Total Bilirubin 0.2 - 1.2 mg/dL 0.6 0.4 0.5  Bilirubin, Direct <=0.2 mg/dL - - 0.2     06/26/2016 CT abdomen/pelvis with CM:  1. No acute abnormality seen to explain the patient's symptoms. 2. Findings of hepatic cirrhosis, with mild splenic varices. 3. Mild diverticulosis along the distal descending and sigmoid colon, without evidence of diverticulitis. 4. Chronic soft tissue inflammation noted at the patient's anterior abdominal wall mesh about the  umbilicus, with associated prominent vasculature. No evidence of significant recurrent hernia. 5. **An incidental finding of potential clinical significance has been found. 4.6 x 4.2 cm enlarged right ovary noted; this has increased gradually in prominence over the past year, and an underlying mass cannot be excluded. Pelvic ultrasound is recommended for further evaluation, when and as deemed clinically appropriate.**      06/14/2012 Colonoscopy with cold biopsy polypectomy. Indications: Diverticulitis Impression; No colonic neoplasms noted. Diverticula scattered in the sigmoid colon. Sessile polyp, proximal transverse. Redundant sigmoid colon.  Will find biopsy.  Review of Systems Past Medical History:  Diagnosis Date  . Anxiety disorder   . Chronic low back pain   . Colitis   . Diabetes mellitus   . Diabetic neuropathy ([place])   . Diverticulitis   . Fatty liver   . GERD (gastroesophageal reflux disease)   . Hypertension   . Irritable bowel syndrome   . Meniere's disease   . Pain     Past Surgical History:  Procedure Laterality Date  . ABDOMINAL HYSTERECTOMY    . bladder tact  2005  . CHOLECYSTECTOMY  08/11  . COLONOSCOPY  2208  . COLONOSCOPY  In of September 2014   @ [facility]/Dr,.[name]  . [place]  .  HERNIA REPAIR  09/17/11  . [place]  2009   Preformed on the right shoulder  . PARTIAL HYSTERECTOMY  2005  . UPPER GASTROINTESTINAL ENDOSCOPY  2008  . URETERAL STENT PLACEMENT      Allergies  Allergen Reactions  . Flagyl [Metronidazole Hcl] Itching    Current Outpatient Prescriptions on File Prior to Visit  Medication Sig Dispense Refill  . acetaminophen (TYLENOL) 500 MG tablet Take 500 mg by mouth every 6 (six) hours as needed for mild pain.    Marland Kitchen acetaZOLAMIDE (DIAMOX) 250 MG tablet Take 250 mg by mouth daily.     Marland Kitchen acyclovir (ZOVIRAX) 400 MG tablet Take 1 tablet by mouth 3 (three) times daily as needed (fever blisters).     Marland Kitchen atorvastatin  (LIPITOR) 20 MG tablet Take 1 tablet (20 mg total) by mouth daily. 90 tablet 3  . cyanocobalamin (,VITAMIN B-12,) 1000 MCG/ML injection Inject 1,000 mcg into the muscle every 30 (thirty) days.  5  . diazepam (VALIUM) 5 MG tablet Take 2.5 mg by mouth every 12 (twelve) hours as needed for anxiety.     . DULoxetine (CYMBALTA) 60 MG capsule Take 60 mg by mouth daily.    . ferrous sulfate 325 (65 FE) MG tablet Take 1 tablet (325 mg total) by mouth daily. 30 tablet 0  . fluticasone (FLONASE) 50 MCG/ACT nasal spray Place 2 sprays into both nostrils every morning.     Marland Kitchen HYDROcodone-acetaminophen (NORCO/VICODIN) 5-325 MG tablet Take 1 tablet by mouth every 6 (six) hours as needed. (Patient taking differently: Take 1 tablet by mouth every 6 (six) hours as needed for moderate pain. ) 30 tablet 0  . ibuprofen (ADVIL,MOTRIN) 600 MG tablet Take 1 tablet (600 mg total) by mouth every 6 (six) hours as needed. (Patient taking differently: Take 600 mg by mouth every 6 (six) hours as needed for moderate pain. ) 30 tablet 0  . metFORMIN (GLUCOPHAGE) 1000 MG tablet Take 1 tablet (1,000 mg total) by mouth 2 (two) times daily with a meal. 60 tablet 2  . metoprolol (LOPRESSOR) 50 MG tablet Take 25 mg by mouth daily. Patient takes 1/2 tablet daily    . ondansetron (ZOFRAN) 4 MG tablet Take 1 tablet by mouth 3 (three) times daily as needed for nausea/vomiting.    . pantoprazole (PROTONIX) 40 MG tablet Take 40 mg by mouth daily.     . traMADol (ULTRAM) 50 MG tablet Take 50 mg by mouth 3 (three) times daily as needed for moderate pain or severe pain.     . TRESIBA FLEXTOUCH 200 UNIT/ML SOPN INJECT 50 UNITS SUBCUTANEOUSLY AT BEDTIME.  4  . glucose blood test strip Use as instructed 4 times daily. E11.65. TrueTrack test strips 150 each 5   No current facility-administered medications on file prior to visit.        Objective:   Physical Exam Blood pressure 110/72, pulse 72, temperature 98.1 F (36.7 C), height 5' 1"  (1.549  m), weight 187 lb 4.8 oz (85 kg). Alert and oriented. Skin warm and dry. Oral mucosa is moist.   . Sclera anicteric, conjunctivae is pink. Thyroid not enlarged. No cervical lymphadenopathy. Lungs clear. Heart regular rate and rhythm.  Abdomen is soft. Bowel sounds are positive. No hepatomegaly. No abdominal masses felt. No tenderness.  No edema to lower extremities.  Stool brown and guaiac negative.       Assessment & Plan:  Cirrhosis: Needs to diet and exercise. Liver enzymes are normal.  Anemia: Ferritin, Iron.  Stool was guaiac negative. OV in 6 months.

## 2016-08-11 NOTE — Patient Instructions (Addendum)
OV in 6 months. CBC today., Iron, Ferritin OV in 6 months.

## 2016-08-12 LAB — CBC WITH DIFFERENTIAL/PLATELET
BASOS PCT: 0 %
Basophils Absolute: 0 cells/uL (ref 0–200)
EOS PCT: 3 %
Eosinophils Absolute: 348 cells/uL (ref 15–500)
HEMATOCRIT: 36.9 % (ref 35.0–45.0)
Hemoglobin: 11.6 g/dL — ABNORMAL LOW (ref 11.7–15.5)
LYMPHS PCT: 40 %
Lymphs Abs: 4640 cells/uL — ABNORMAL HIGH (ref 850–3900)
MCH: 26.1 pg — ABNORMAL LOW (ref 27.0–33.0)
MCHC: 31.4 g/dL — AB (ref 32.0–36.0)
MCV: 83.1 fL (ref 80.0–100.0)
MONOS PCT: 6 %
MPV: 10.2 fL (ref 7.5–12.5)
Monocytes Absolute: 696 cells/uL (ref 200–950)
NEUTROS PCT: 51 %
Neutro Abs: 5916 cells/uL (ref 1500–7800)
PLATELETS: 281 10*3/uL (ref 140–400)
RBC: 4.44 MIL/uL (ref 3.80–5.10)
RDW: 20.1 % — AB (ref 11.0–15.0)
WBC: 11.6 10*3/uL — AB (ref 3.8–10.8)

## 2016-08-12 LAB — IRON: IRON: 67 ug/dL (ref 45–160)

## 2016-08-12 LAB — FERRITIN: Ferritin: 18 ng/mL (ref 10–232)

## 2016-08-13 ENCOUNTER — Telehealth (INDEPENDENT_AMBULATORY_CARE_PROVIDER_SITE_OTHER): Payer: Self-pay | Admitting: Internal Medicine

## 2016-08-13 NOTE — Telephone Encounter (Signed)
Patient called, stated that she was returning Dr. Olevia Perches call regarding her labs.  [phone]

## 2016-08-13 NOTE — Telephone Encounter (Signed)
Per Terri she has called the patient and left a message on her voicemail. She is going to call the patient back to discuss her results.

## 2016-08-14 ENCOUNTER — Other Ambulatory Visit (INDEPENDENT_AMBULATORY_CARE_PROVIDER_SITE_OTHER): Payer: Self-pay | Admitting: *Deleted

## 2016-08-14 DIAGNOSIS — R112 Nausea with vomiting, unspecified: Secondary | ICD-10-CM

## 2016-08-14 DIAGNOSIS — E119 Type 2 diabetes mellitus without complications: Secondary | ICD-10-CM | POA: Diagnosis not present

## 2016-08-14 DIAGNOSIS — K760 Fatty (change of) liver, not elsewhere classified: Secondary | ICD-10-CM

## 2016-08-14 DIAGNOSIS — K589 Irritable bowel syndrome without diarrhea: Secondary | ICD-10-CM

## 2016-08-14 DIAGNOSIS — I10 Essential (primary) hypertension: Secondary | ICD-10-CM | POA: Diagnosis not present

## 2016-08-14 DIAGNOSIS — M159 Polyosteoarthritis, unspecified: Secondary | ICD-10-CM | POA: Diagnosis not present

## 2016-08-19 DIAGNOSIS — N83291 Other ovarian cyst, right side: Secondary | ICD-10-CM | POA: Diagnosis not present

## 2016-08-19 DIAGNOSIS — G629 Polyneuropathy, unspecified: Secondary | ICD-10-CM | POA: Diagnosis not present

## 2016-08-19 DIAGNOSIS — Z48816 Encounter for surgical aftercare following surgery on the genitourinary system: Secondary | ICD-10-CM | POA: Diagnosis not present

## 2016-08-19 DIAGNOSIS — E119 Type 2 diabetes mellitus without complications: Secondary | ICD-10-CM | POA: Diagnosis not present

## 2016-08-19 DIAGNOSIS — K760 Fatty (change of) liver, not elsewhere classified: Secondary | ICD-10-CM | POA: Diagnosis not present

## 2016-08-19 DIAGNOSIS — I10 Essential (primary) hypertension: Secondary | ICD-10-CM | POA: Diagnosis not present

## 2016-08-25 ENCOUNTER — Encounter (INDEPENDENT_AMBULATORY_CARE_PROVIDER_SITE_OTHER): Payer: Self-pay | Admitting: *Deleted

## 2016-08-25 ENCOUNTER — Other Ambulatory Visit (INDEPENDENT_AMBULATORY_CARE_PROVIDER_SITE_OTHER): Payer: Self-pay | Admitting: *Deleted

## 2016-08-25 DIAGNOSIS — R112 Nausea with vomiting, unspecified: Secondary | ICD-10-CM

## 2016-08-25 DIAGNOSIS — N951 Menopausal and female climacteric states: Secondary | ICD-10-CM | POA: Diagnosis not present

## 2016-08-25 DIAGNOSIS — R5383 Other fatigue: Secondary | ICD-10-CM | POA: Diagnosis not present

## 2016-08-25 DIAGNOSIS — E119 Type 2 diabetes mellitus without complications: Secondary | ICD-10-CM | POA: Diagnosis not present

## 2016-08-25 DIAGNOSIS — I10 Essential (primary) hypertension: Secondary | ICD-10-CM | POA: Diagnosis not present

## 2016-08-25 DIAGNOSIS — K760 Fatty (change of) liver, not elsewhere classified: Secondary | ICD-10-CM | POA: Diagnosis not present

## 2016-08-25 DIAGNOSIS — Z48816 Encounter for surgical aftercare following surgery on the genitourinary system: Secondary | ICD-10-CM | POA: Diagnosis not present

## 2016-08-25 DIAGNOSIS — N83291 Other ovarian cyst, right side: Secondary | ICD-10-CM | POA: Diagnosis not present

## 2016-08-25 DIAGNOSIS — K589 Irritable bowel syndrome without diarrhea: Secondary | ICD-10-CM

## 2016-08-25 DIAGNOSIS — G629 Polyneuropathy, unspecified: Secondary | ICD-10-CM | POA: Diagnosis not present

## 2016-08-26 DIAGNOSIS — Z7189 Other specified counseling: Secondary | ICD-10-CM | POA: Diagnosis not present

## 2016-08-26 DIAGNOSIS — Z1211 Encounter for screening for malignant neoplasm of colon: Secondary | ICD-10-CM | POA: Diagnosis not present

## 2016-08-26 DIAGNOSIS — R5383 Other fatigue: Secondary | ICD-10-CM | POA: Diagnosis not present

## 2016-08-26 DIAGNOSIS — Z299 Encounter for prophylactic measures, unspecified: Secondary | ICD-10-CM | POA: Diagnosis not present

## 2016-08-26 DIAGNOSIS — Z1389 Encounter for screening for other disorder: Secondary | ICD-10-CM | POA: Diagnosis not present

## 2016-08-26 DIAGNOSIS — H8109 Meniere's disease, unspecified ear: Secondary | ICD-10-CM | POA: Diagnosis not present

## 2016-08-26 DIAGNOSIS — E1143 Type 2 diabetes mellitus with diabetic autonomic (poly)neuropathy: Secondary | ICD-10-CM | POA: Diagnosis not present

## 2016-08-26 DIAGNOSIS — E7800 Pure hypercholesterolemia, unspecified: Secondary | ICD-10-CM | POA: Diagnosis not present

## 2016-08-26 DIAGNOSIS — Z0000 Encounter for general adult medical examination without abnormal findings: Secondary | ICD-10-CM | POA: Diagnosis not present

## 2016-09-01 DIAGNOSIS — Z79899 Other long term (current) drug therapy: Secondary | ICD-10-CM | POA: Diagnosis not present

## 2016-09-01 DIAGNOSIS — R5383 Other fatigue: Secondary | ICD-10-CM | POA: Diagnosis not present

## 2016-09-01 DIAGNOSIS — E7800 Pure hypercholesterolemia, unspecified: Secondary | ICD-10-CM | POA: Diagnosis not present

## 2016-09-02 ENCOUNTER — Encounter: Payer: Self-pay | Admitting: Orthopedic Surgery

## 2016-09-02 ENCOUNTER — Ambulatory Visit (INDEPENDENT_AMBULATORY_CARE_PROVIDER_SITE_OTHER): Payer: Medicare Other | Admitting: Orthopedic Surgery

## 2016-09-02 DIAGNOSIS — M19071 Primary osteoarthritis, right ankle and foot: Secondary | ICD-10-CM | POA: Diagnosis not present

## 2016-09-02 DIAGNOSIS — M19072 Primary osteoarthritis, left ankle and foot: Secondary | ICD-10-CM

## 2016-09-02 DIAGNOSIS — M659 Synovitis and tenosynovitis, unspecified: Secondary | ICD-10-CM | POA: Diagnosis not present

## 2016-09-02 NOTE — Progress Notes (Signed)
Patient ID: [name], female   DOB: May 15, 1961, 55 y.o.   MRN: [id number]  Chief Complaint  Patient presents with  . Follow-up    FOOT AND ANKLE PAIN    HPI [name] is a 55 y.o. female.  Presents for reevaluation of her right foot which is still hurting after injury back in October around the 17th. She had a subtalar lateral process calcaneus fracture  She now complains of pain in that area as well as new onset of pain in the subtalar joint on the left foot and ankle with no new trauma.  It is a dull ache associated with weightbearing pain. On the left side it does radiate to her left small toe  The subtalar pain on the left started about 2 weeks after her initial injury in October  Review of Systems Review of Systems  Cardiovascular: Negative.   Gastrointestinal: Negative.   Neurological: Negative for weakness and numbness.   Past Medical History:  Diagnosis Date  . Anxiety disorder   . Chronic low back pain   . Colitis   . Diabetes mellitus   . Diabetic neuropathy ([place])   . Diverticulitis   . Fatty liver   . GERD (gastroesophageal reflux disease)   . Hypertension   . Irritable bowel syndrome   . Meniere's disease   . Pain     Past Surgical History:  Procedure Laterality Date  . ABDOMINAL HYSTERECTOMY    . bladder tact  2005  . CHOLECYSTECTOMY  08/11  . COLONOSCOPY  2208  . COLONOSCOPY  In of September 2014   @ [facility]/Dr,.[name]  . [place]  . HERNIA REPAIR  09/17/11  . [place]  2009   Preformed on the right shoulder  . PARTIAL HYSTERECTOMY  2005  . UPPER GASTROINTESTINAL ENDOSCOPY  2008  . URETERAL STENT PLACEMENT      Social History Social History  Substance Use Topics  . Smoking status: Never Smoker  . Smokeless tobacco: Never Used  . Alcohol use No    Allergies  Allergen Reactions  . Flagyl [Metronidazole Hcl] Itching    Current Meds  Medication Sig  . acetaminophen (TYLENOL) 500 MG tablet Take 500 mg by mouth  every 6 (six) hours as needed for mild pain.  Marland Kitchen acetaZOLAMIDE (DIAMOX) 250 MG tablet Take 250 mg by mouth daily.   Marland Kitchen acyclovir (ZOVIRAX) 400 MG tablet Take 1 tablet by mouth 3 (three) times daily as needed (fever blisters).   Marland Kitchen atorvastatin (LIPITOR) 20 MG tablet Take 1 tablet (20 mg total) by mouth daily.  . cyanocobalamin (,VITAMIN B-12,) 1000 MCG/ML injection Inject 1,000 mcg into the muscle every 30 (thirty) days.  . diazepam (VALIUM) 5 MG tablet Take 2.5 mg by mouth every 12 (twelve) hours as needed for anxiety.   . DULoxetine (CYMBALTA) 60 MG capsule Take 60 mg by mouth daily.  . ferrous sulfate 325 (65 FE) MG tablet Take 1 tablet (325 mg total) by mouth daily.  . fluticasone (FLONASE) 50 MCG/ACT nasal spray Place 2 sprays into both nostrils every morning.   Marland Kitchen glucose blood test strip Use as instructed 4 times daily. E11.65. TrueTrack test strips  . HYDROcodone-acetaminophen (NORCO/VICODIN) 5-325 MG tablet Take 1 tablet by mouth every 6 (six) hours as needed. (Patient taking differently: Take 1 tablet by mouth every 6 (six) hours as needed for moderate pain. )  . ibuprofen (ADVIL,MOTRIN) 600 MG tablet Take 1 tablet (600 mg total) by mouth every  6 (six) hours as needed. (Patient taking differently: Take 600 mg by mouth every 6 (six) hours as needed for moderate pain. )  . metFORMIN (GLUCOPHAGE) 1000 MG tablet Take 1 tablet (1,000 mg total) by mouth 2 (two) times daily with a meal.  . metoprolol (LOPRESSOR) 50 MG tablet Take 25 mg by mouth daily. Patient takes 1/2 tablet daily  . ondansetron (ZOFRAN) 4 MG tablet Take 1 tablet by mouth 3 (three) times daily as needed for nausea/vomiting.  . pantoprazole (PROTONIX) 40 MG tablet Take 40 mg by mouth daily.   . traMADol (ULTRAM) 50 MG tablet Take 50 mg by mouth 3 (three) times daily as needed for moderate pain or severe pain.   . TRESIBA FLEXTOUCH 200 UNIT/ML SOPN INJECT 50 UNITS SUBCUTANEOUSLY AT BEDTIME.      Physical Exam Physical  Exam There were no vitals taken for this visit.  Gen. appearance. The patient is well-developed and well-nourished, grooming and hygiene are normal. There are no gross congenital abnormalities  The patient is alert and oriented to person place and time  Mood and affect are normal  Ambulation Slightly antalgic gait favoring both lower extremities  Examination reveals the following: On inspection we find tenderness in the subtalar joint of the right and left foot with swelling and no erythema  With the range of motion of  normal in the right and left ankle and subtalar joint on the right and left  Stability tests were normal  right and left ankle  Strength tests revealed grade 5 motor strength right and left foot and ankle  Skin we find no rash ulceration or erythema right and left foot  Sensation remains intact right and left foot  Impression vascular system shows no peripheral edema in either right or left foot   Data Reviewed Right ankle x-ray left ankle x-ray was reviewed and the right ankle/foot shows a lateral process fracture: X-rays done August 10 on the right and September 20 on the left  Assessment    Encounter Diagnoses  Name Primary?  Marland Kitchen Arthritis of ankle or foot, degenerative, right Yes  . Primary osteoarthritis of left foot   . Synovitis of left foot   . Synovitis of right foot        Plan    I injected both subtalar joints and placed in a [place] Walker on the left  Follow-up 6 weeks      SUBATLAR JOINT INJECTION   MEDS: DEPOMEDROL 40 MG             LIDOCAINE 1% 3 CC   TECHNIQUE: VERBAL CONSENT WAS GIVEN AND TIME WAS TAKEN TO CONFIRM INJECTION SITE: 25-gauge needle was used to inject the medication into the subtalar joint on the right foot  We then repeated the same process on the left foot   Arther Abbott 09/02/2016, 10:27 AM

## 2016-09-02 NOTE — Patient Instructions (Signed)
You have received an injection of steroids into the joint. 15% of patients will have increased pain within the 24 hours postinjection.   This is transient and will go away.   We recommend that you use ice packs on the injection site for 20 minutes every 2 hours and extra strength Tylenol 2 tablets every 8 as needed until the pain resolves.  If you continue to have pain after taking the Tylenol and using the ice please call the office for further instructions.  SOAK FOOT IN EPSOM SALT FOR 20 MIN EVERY NIGHT

## 2016-09-04 ENCOUNTER — Telehealth (INDEPENDENT_AMBULATORY_CARE_PROVIDER_SITE_OTHER): Payer: Self-pay | Admitting: Internal Medicine

## 2016-09-04 NOTE — Telephone Encounter (Signed)
Patient called, stated that she is having a lot of pain in her upper left and middle abdomen, nauseated and bloated.  She wants to know if this has anything to do with her cirrhosis of the liver.  [phone]

## 2016-09-10 NOTE — Telephone Encounter (Signed)
No answer at home #

## 2016-09-10 NOTE — Telephone Encounter (Signed)
Forwarded you a message from the patient.

## 2016-09-10 NOTE — Telephone Encounter (Signed)
Message left on answering machine

## 2016-09-11 ENCOUNTER — Other Ambulatory Visit (INDEPENDENT_AMBULATORY_CARE_PROVIDER_SITE_OTHER): Payer: Self-pay | Admitting: *Deleted

## 2016-09-11 DIAGNOSIS — R112 Nausea with vomiting, unspecified: Secondary | ICD-10-CM | POA: Diagnosis not present

## 2016-09-11 DIAGNOSIS — K588 Other irritable bowel syndrome: Secondary | ICD-10-CM | POA: Diagnosis not present

## 2016-09-11 DIAGNOSIS — E7800 Pure hypercholesterolemia, unspecified: Secondary | ICD-10-CM | POA: Diagnosis not present

## 2016-09-11 DIAGNOSIS — K760 Fatty (change of) liver, not elsewhere classified: Secondary | ICD-10-CM | POA: Diagnosis not present

## 2016-09-11 LAB — CBC
HCT: 37.8 % (ref 35.0–45.0)
Hemoglobin: 12.2 g/dL (ref 11.7–15.5)
MCH: 27.8 pg (ref 27.0–33.0)
MCHC: 32.3 g/dL (ref 32.0–36.0)
MCV: 86.1 fL (ref 80.0–100.0)
MPV: 10 fL (ref 7.5–12.5)
PLATELETS: 226 10*3/uL (ref 140–400)
RBC: 4.39 MIL/uL (ref 3.80–5.10)
RDW: 17.8 % — AB (ref 11.0–15.0)
WBC: 11.5 10*3/uL — ABNORMAL HIGH (ref 3.8–10.8)

## 2016-09-11 LAB — HEPATIC FUNCTION PANEL
ALBUMIN: 3.6 g/dL (ref 3.6–5.1)
ALT: 23 U/L (ref 6–29)
AST: 32 U/L (ref 10–35)
Alkaline Phosphatase: 108 U/L (ref 33–130)
BILIRUBIN TOTAL: 0.4 mg/dL (ref 0.2–1.2)
Bilirubin, Direct: 0.1 mg/dL (ref <=0.2)
Indirect Bilirubin: 0.3 mg/dL (ref 0.2–1.2)
Total Protein: 7.5 g/dL (ref 6.1–8.1)

## 2016-09-12 LAB — HEPATITIS PANEL, ACUTE
HCV AB: NEGATIVE
HEP A IGM: NONREACTIVE
HEP B C IGM: NONREACTIVE
Hepatitis B Surface Ag: NEGATIVE

## 2016-09-12 LAB — ANTI-SMOOTH MUSCLE ANTIBODY, IGG

## 2016-09-12 LAB — ANA: Anti Nuclear Antibody(ANA): NEGATIVE

## 2016-09-13 ENCOUNTER — Other Ambulatory Visit: Payer: Self-pay | Admitting: "Endocrinology

## 2016-09-15 ENCOUNTER — Other Ambulatory Visit (INDEPENDENT_AMBULATORY_CARE_PROVIDER_SITE_OTHER): Payer: Self-pay | Admitting: *Deleted

## 2016-09-15 DIAGNOSIS — R109 Unspecified abdominal pain: Secondary | ICD-10-CM | POA: Diagnosis not present

## 2016-09-15 DIAGNOSIS — K760 Fatty (change of) liver, not elsewhere classified: Secondary | ICD-10-CM

## 2016-09-15 DIAGNOSIS — K589 Irritable bowel syndrome without diarrhea: Secondary | ICD-10-CM

## 2016-09-15 DIAGNOSIS — E7800 Pure hypercholesterolemia, unspecified: Secondary | ICD-10-CM

## 2016-09-15 DIAGNOSIS — R1013 Epigastric pain: Secondary | ICD-10-CM | POA: Diagnosis not present

## 2016-09-15 DIAGNOSIS — I10 Essential (primary) hypertension: Secondary | ICD-10-CM | POA: Diagnosis not present

## 2016-09-15 LAB — ALPHA-1-ANTITRYPSIN: A1 ANTITRYPSIN SER: 155 mg/dL (ref 83–199)

## 2016-09-15 LAB — CERULOPLASMIN: Ceruloplasmin: 33 mg/dL (ref 18–53)

## 2016-09-15 NOTE — Telephone Encounter (Signed)
I have spoken with patient 

## 2016-09-17 DIAGNOSIS — I10 Essential (primary) hypertension: Secondary | ICD-10-CM | POA: Diagnosis not present

## 2016-09-17 DIAGNOSIS — Z833 Family history of diabetes mellitus: Secondary | ICD-10-CM | POA: Diagnosis not present

## 2016-09-17 DIAGNOSIS — Z79899 Other long term (current) drug therapy: Secondary | ICD-10-CM | POA: Diagnosis not present

## 2016-09-17 DIAGNOSIS — Z794 Long term (current) use of insulin: Secondary | ICD-10-CM | POA: Diagnosis not present

## 2016-09-17 DIAGNOSIS — K5900 Constipation, unspecified: Secondary | ICD-10-CM | POA: Diagnosis not present

## 2016-09-17 DIAGNOSIS — K7460 Unspecified cirrhosis of liver: Secondary | ICD-10-CM | POA: Diagnosis not present

## 2016-09-17 DIAGNOSIS — Z87442 Personal history of urinary calculi: Secondary | ICD-10-CM | POA: Diagnosis not present

## 2016-09-17 DIAGNOSIS — R1032 Left lower quadrant pain: Secondary | ICD-10-CM | POA: Diagnosis not present

## 2016-09-17 DIAGNOSIS — M1990 Unspecified osteoarthritis, unspecified site: Secondary | ICD-10-CM | POA: Diagnosis not present

## 2016-09-17 DIAGNOSIS — K219 Gastro-esophageal reflux disease without esophagitis: Secondary | ICD-10-CM | POA: Diagnosis not present

## 2016-09-17 DIAGNOSIS — E119 Type 2 diabetes mellitus without complications: Secondary | ICD-10-CM | POA: Diagnosis not present

## 2016-09-17 DIAGNOSIS — R1900 Intra-abdominal and pelvic swelling, mass and lump, unspecified site: Secondary | ICD-10-CM | POA: Diagnosis not present

## 2016-09-18 DIAGNOSIS — R1900 Intra-abdominal and pelvic swelling, mass and lump, unspecified site: Secondary | ICD-10-CM | POA: Diagnosis not present

## 2016-09-19 DIAGNOSIS — F439 Reaction to severe stress, unspecified: Secondary | ICD-10-CM | POA: Insufficient documentation

## 2016-09-19 DIAGNOSIS — F419 Anxiety disorder, unspecified: Secondary | ICD-10-CM | POA: Insufficient documentation

## 2016-09-24 ENCOUNTER — Emergency Department (HOSPITAL_COMMUNITY)
Admission: EM | Admit: 2016-09-24 | Discharge: 2016-09-24 | Disposition: A | Discharge status: Home / Self Care | Payer: Medicare Other | Attending: Emergency Medicine | Admitting: Emergency Medicine

## 2016-09-24 ENCOUNTER — Encounter (HOSPITAL_COMMUNITY): Payer: Self-pay | Admitting: *Deleted

## 2016-09-24 ENCOUNTER — Emergency Department (HOSPITAL_COMMUNITY): Payer: Medicare Other

## 2016-09-24 DIAGNOSIS — E119 Type 2 diabetes mellitus without complications: Secondary | ICD-10-CM | POA: Diagnosis not present

## 2016-09-24 DIAGNOSIS — I10 Essential (primary) hypertension: Secondary | ICD-10-CM | POA: Diagnosis not present

## 2016-09-24 DIAGNOSIS — Z79899 Other long term (current) drug therapy: Secondary | ICD-10-CM | POA: Insufficient documentation

## 2016-09-24 DIAGNOSIS — K429 Umbilical hernia without obstruction or gangrene: Secondary | ICD-10-CM | POA: Diagnosis not present

## 2016-09-24 DIAGNOSIS — R1033 Periumbilical pain: Secondary | ICD-10-CM

## 2016-09-24 DIAGNOSIS — K529 Noninfective gastroenteritis and colitis, unspecified: Secondary | ICD-10-CM | POA: Insufficient documentation

## 2016-09-24 DIAGNOSIS — R1030 Lower abdominal pain, unspecified: Secondary | ICD-10-CM | POA: Diagnosis not present

## 2016-09-24 HISTORY — DX: Unspecified cirrhosis of liver: K74.60

## 2016-09-24 HISTORY — DX: Pure hypercholesterolemia, unspecified: E78.00

## 2016-09-24 HISTORY — DX: Polyneuropathy, unspecified: G62.9

## 2016-09-24 LAB — CBC WITH DIFFERENTIAL/PLATELET
BASOS PCT: 0 %
Basophils Absolute: 0 10*3/uL (ref 0.0–0.1)
Eosinophils Absolute: 0.2 10*3/uL (ref 0.0–0.7)
Eosinophils Relative: 2 %
HEMATOCRIT: 37.7 % (ref 36.0–46.0)
Hemoglobin: 12.1 g/dL (ref 12.0–15.0)
Lymphocytes Relative: 42 %
Lymphs Abs: 4.8 10*3/uL — ABNORMAL HIGH (ref 0.7–4.0)
MCH: 28.3 pg (ref 26.0–34.0)
MCHC: 32.1 g/dL (ref 30.0–36.0)
MCV: 88.3 fL (ref 78.0–100.0)
MONO ABS: 0.7 10*3/uL (ref 0.1–1.0)
MONOS PCT: 6 %
NEUTROS ABS: 5.7 10*3/uL (ref 1.7–7.7)
Neutrophils Relative %: 50 %
Platelets: 174 10*3/uL (ref 150–400)
RBC: 4.27 MIL/uL (ref 3.87–5.11)
RDW: 16.9 % — ABNORMAL HIGH (ref 11.5–15.5)
WBC: 11.4 10*3/uL — ABNORMAL HIGH (ref 4.0–10.5)

## 2016-09-24 LAB — COMPREHENSIVE METABOLIC PANEL
ALBUMIN: 3.5 g/dL (ref 3.5–5.0)
ALK PHOS: 107 U/L (ref 38–126)
ALT: 29 U/L (ref 14–54)
AST: 39 U/L (ref 15–41)
Anion gap: 10 (ref 5–15)
BILIRUBIN TOTAL: 0.3 mg/dL (ref 0.3–1.2)
BUN: 13 mg/dL (ref 6–20)
CO2: 21 mmol/L — ABNORMAL LOW (ref 22–32)
CREATININE: 0.68 mg/dL (ref 0.44–1.00)
Calcium: 9.7 mg/dL (ref 8.9–10.3)
Chloride: 108 mmol/L (ref 101–111)
GFR calc Af Amer: >60 mL/min (ref >60)
GFR calc non Af Amer: >60 mL/min (ref >60)
GLUCOSE: 100 mg/dL — AB (ref 65–99)
Potassium: 3.4 mmol/L — ABNORMAL LOW (ref 3.5–5.1)
Sodium: 139 mmol/L (ref 135–145)
TOTAL PROTEIN: 7.9 g/dL (ref 6.5–8.1)

## 2016-09-24 LAB — URINALYSIS, ROUTINE W REFLEX MICROSCOPIC
BILIRUBIN URINE: NEGATIVE
GLUCOSE, UA: NEGATIVE mg/dL
Hgb urine dipstick: NEGATIVE
KETONES UR: NEGATIVE mg/dL
Leukocytes, UA: NEGATIVE
NITRITE: NEGATIVE
PH: 7 (ref 5.0–8.0)
Protein, ur: NEGATIVE mg/dL
Specific Gravity, Urine: 1.015 (ref 1.005–1.030)

## 2016-09-24 LAB — LIPASE, BLOOD: Lipase: 31 U/L (ref 11–51)

## 2016-09-24 MED ORDER — ONDANSETRON HCL 4 MG/2ML IJ SOLN
4.0000 mg | Freq: Once | INTRAMUSCULAR | Status: AC
  Administered 2016-09-24: 4 mg via INTRAVENOUS
  Filled 2016-09-24: qty 2

## 2016-09-24 MED ORDER — PANTOPRAZOLE SODIUM 20 MG PO TBEC: DELAYED_RELEASE_TABLET | ORAL | 0 refills | Status: DC

## 2016-09-24 MED ORDER — FENTANYL CITRATE (PF) 100 MCG/2ML IJ SOLN
50.0000 ug | Freq: Once | INTRAMUSCULAR | Status: AC
  Administered 2016-09-24: 50 ug via INTRAVENOUS
  Filled 2016-09-24: qty 2

## 2016-09-24 MED ORDER — SODIUM CHLORIDE 0.9 % IV BOLUS (SEPSIS)
500.0000 mL | Freq: Once | INTRAVENOUS | Status: AC
  Administered 2016-09-24: 500 mL via INTRAVENOUS

## 2016-09-24 MED ORDER — SODIUM CHLORIDE 0.9 % IV BOLUS (SEPSIS)
1000.0000 mL | Freq: Once | INTRAVENOUS | Status: AC
  Administered 2016-09-24: 1000 mL via INTRAVENOUS

## 2016-09-24 MED ORDER — IOPAMIDOL (ISOVUE-300) INJECTION 61%
100.0000 mL | Freq: Once | INTRAVENOUS | Status: AC | PRN
  Administered 2016-09-24: 100 mL via INTRAVENOUS

## 2016-09-24 MED ORDER — ONDANSETRON HCL 4 MG PO TABS: 4.0000 mg | ORAL_TABLET | Freq: Three times a day (TID) | ORAL | 0 refills | Status: AC | PRN

## 2016-09-24 NOTE — ED Notes (Addendum)
Pt A/O X4. Pt ambulatory to waiting room. Pt verbalized understanding of discharge instructions.

## 2016-09-24 NOTE — ED Triage Notes (Signed)
Pt c/o abd pain, abd swelling that started a few weeks ago that has gotten worse tonight,

## 2016-09-24 NOTE — ED Provider Notes (Signed)
[name]   CSN: [id number] Arrival date & time: 09/24/16  0118  Time seen 01:50 AM   History   Chief Complaint Chief Complaint  Patient presents with  . Abdominal Pain    HPI [name] is a 55 y.o. female.  HPI  Patient reports she's been getting periumbilical abdominal pain off and on for the past several weeks. She states she feels bloated in the area is sore to touch. She states those pains have lasted several hours. About 8 PM tonight the pain started again. She states it is sharp and now is radiating into her right back. She also describes the pain as stabbing. She has had nausea and vomiting several times before with the pain but not tonight. She initially told me certain foods make the pain worse however she cannot name a food that makes it worse. She states certain movements makes it worse. Nothing makes it feel better. Patient states she's had prior hernia repair, cholecystectomy, hysterectomy, right ovarian tumor removal, ectopic pregnancy, bladder tack  and has IBS with diarrhea. She's also had diverticulosis and diverticulitis. She states this pain is different. She denies fever. She states she normally has 3-4 diarrhea stools a day which has not changed. She denies any dysuria or frequency or fever.  PCP Dr. Woody Seller in [place]  Past Medical History:  Diagnosis Date  . Anxiety disorder   . Chronic low back pain   . Cirrhosis of liver ([place])   . Colitis   . Diabetes mellitus   . Diabetic neuropathy ([place])   . Diverticulitis   . Fatty liver   . GERD (gastroesophageal reflux disease)   . High cholesterol   . Hypertension   . Irritable bowel syndrome   . Meniere's disease   . Neuropathy ([place])   . Pain     Patient Active Problem List   Diagnosis Date Noted  . Hypercholesteremia 07/15/2016  . Morbid obesity due to excess calories ([place]) 04/29/2016  . Nausea and vomiting 01/17/2014  . HTN (hypertension) 10/25/2013  . Fatty liver disease,  nonalcoholic 67/89/3810  . IBS (irritable bowel syndrome) 02/24/2012  . Type 2 diabetes mellitus, uncontrolled ([place]) 02/24/2012    Past Surgical History:  Procedure Laterality Date  . ABDOMINAL HYSTERECTOMY    . bladder tact  2005  . CHOLECYSTECTOMY  08/11  . COLONOSCOPY  2208  . COLONOSCOPY  In of September 2014   @ [facility]/Dr,.[name]  . [place]  . HERNIA REPAIR  09/17/11  . [place]  2009   Preformed on the right shoulder  . OVARIAN CYST REMOVAL     right side  . PARTIAL HYSTERECTOMY  2005  . UPPER GASTROINTESTINAL ENDOSCOPY  2008  . URETERAL STENT PLACEMENT      OB History    No data available       Home Medications    Prior to Admission medications   Medication Sig Start Date End Date Taking? Authorizing Provider  acetaZOLAMIDE (DIAMOX) 250 MG tablet Take 250 mg by mouth daily.  04/28/16  Yes Historical Provider, MD  acyclovir (ZOVIRAX) 400 MG tablet Take 1 tablet by mouth 3 (three) times daily as needed (fever blisters).  06/25/16  Yes Historical Provider, MD  atorvastatin (LIPITOR) 20 MG tablet Take 1 tablet (20 mg total) by mouth daily. 07/15/16  Yes Cassandria Anger, MD  cyanocobalamin (,VITAMIN B-12,) 1000 MCG/ML injection Inject 1,000 mcg into the muscle every 30 (thirty) days. 04/14/16  Yes  Historical Provider, MD  diazepam (VALIUM) 5 MG tablet Take 2.5 mg by mouth every 12 (twelve) hours as needed for anxiety.  04/07/16  Yes Historical Provider, MD  DULoxetine (CYMBALTA) 60 MG capsule Take 60 mg by mouth daily. 04/28/16  Yes Historical Provider, MD  ferrous sulfate 325 (65 FE) MG tablet Take 1 tablet (325 mg total) by mouth daily. 06/26/16  Yes Tammy Triplett, PA-C  fluticasone (FLONASE) 50 MCG/ACT nasal spray Place 2 sprays into both nostrils every morning.    Yes Historical Provider, MD  glucose blood test strip Use as instructed 4 times daily. E11.65. TrueTrack test strips 07/01/16  Yes Cassandria Anger, MD  metFORMIN (GLUCOPHAGE) 1000 MG  tablet TAKE ONE TABLET BY MOUTH TWICE DAILY WITH A MEAL. 09/15/16  Yes Cassandria Anger, MD  metoprolol (LOPRESSOR) 50 MG tablet Take 25 mg by mouth daily. Patient takes 1/2 tablet daily   Yes Historical Provider, MD  TRESIBA FLEXTOUCH 200 UNIT/ML SOPN INJECT 50 UNITS SUBCUTANEOUSLY AT BEDTIME. 04/28/16  Yes Historical Provider, MD  acetaminophen (TYLENOL) 500 MG tablet Take 500 mg by mouth every 6 (six) hours as needed for mild pain.    Historical Provider, MD  HYDROcodone-acetaminophen (NORCO/VICODIN) 5-325 MG tablet Take 1 tablet by mouth every 6 (six) hours as needed. Patient taking differently: Take 1 tablet by mouth every 6 (six) hours as needed for moderate pain.  06/11/16   Carole Civil, MD  ibuprofen (ADVIL,MOTRIN) 600 MG tablet Take 1 tablet (600 mg total) by mouth every 6 (six) hours as needed. Patient taking differently: Take 600 mg by mouth every 6 (six) hours as needed for moderate pain.  05/15/16   Lily Kocher, PA-C  ondansetron (ZOFRAN) 4 MG tablet Take 1 tablet (4 mg total) by mouth every 8 (eight) hours as needed for nausea or vomiting. 09/24/16   Rolland Porter, MD  pantoprazole (PROTONIX) 20 MG tablet Take 1 po BID x 2 weeks then once a day 09/24/16   Rolland Porter, MD  traMADol (ULTRAM) 50 MG tablet Take 50 mg by mouth 3 (three) times daily as needed for moderate pain or severe pain.  02/18/16   Historical Provider, MD    Family History Family History  Problem Relation Age of Onset  . Healthy Mother   . Heart disease Father   . Esophageal cancer Brother   . Healthy Son   . Healthy Son     Social History Social History  Substance Use Topics  . Smoking status: Never Smoker  . Smokeless tobacco: Never Used  . Alcohol use No  On disability for diabetes, Mnire's disease, and chronic pain   Allergies   Flagyl [metronidazole hcl] and Nsaids   Review of Systems Review of Systems  All other systems reviewed and are negative.    Physical Exam Updated Vital  Signs BP 134/71 (BP Location: Left Arm)   Pulse 96   Temp 97.7 F (36.5 C) (Oral)   Resp 18   Ht 5' 2"  (1.575 m)   Wt 184 lb (83.5 kg)   SpO2 97%   BMI 33.65 kg/m   Vital signs normal    Physical Exam  Constitutional: She is oriented to person, place, and time. She appears well-developed and well-nourished.  Non-toxic appearance. She does not appear ill. No distress.  HENT:  Head: Normocephalic and atraumatic.  Right Ear: External ear normal.  Left Ear: External ear normal.  Nose: Nose normal. No mucosal edema or rhinorrhea.  Mouth/Throat: Oropharynx is clear  and moist and mucous membranes are normal. No dental abscesses or uvula swelling.  Eyes: Conjunctivae and EOM are normal. Pupils are equal, round, and reactive to light.  Neck: Normal range of motion and full passive range of motion without pain. Neck supple.  Cardiovascular: Normal rate, regular rhythm and normal heart sounds.  Exam reveals no gallop and no friction rub.   No murmur heard. Pulmonary/Chest: Effort normal and breath sounds normal. No respiratory distress. She has no wheezes. She has no rhonchi. She has no rales. She exhibits no tenderness and no crepitus.  Abdominal: Soft. Normal appearance and bowel sounds are normal. She exhibits no distension. There is tenderness. There is no rebound and no guarding.    Patient has tenderness in the right lateral abdomen.  Musculoskeletal: Normal range of motion. She exhibits no edema or tenderness.  Moves all extremities well.   Neurological: She is alert and oriented to person, place, and time. She has normal strength. No cranial nerve deficit.  Skin: Skin is warm, dry and intact. No rash noted. No erythema. No pallor.  Psychiatric: She has a normal mood and affect. Her speech is normal and behavior is normal. Her mood appears not anxious.  Nursing note and vitals reviewed.    ED Treatments / Results  Labs (all labs ordered are listed, but only abnormal results are  displayed) Results for orders placed or performed during the hospital encounter of 09/24/16  Comprehensive metabolic panel  Result Value Ref Range   Sodium 139 135 - 145 mmol/L   Potassium 3.4 (L) 3.5 - 5.1 mmol/L   Chloride 108 101 - 111 mmol/L   CO2 21 (L) 22 - 32 mmol/L   Glucose, Bld 100 (H) 65 - 99 mg/dL   BUN 13 6 - 20 mg/dL   Creatinine, Ser 0.68 0.44 - 1.00 mg/dL   Calcium 9.7 8.9 - 10.3 mg/dL   Total Protein 7.9 6.5 - 8.1 g/dL   Albumin 3.5 3.5 - 5.0 g/dL   AST 39 15 - 41 U/L   ALT 29 14 - 54 U/L   Alkaline Phosphatase 107 38 - 126 U/L   Total Bilirubin 0.3 0.3 - 1.2 mg/dL   GFR calc non Af Amer >60 >60 mL/min   GFR calc Af Amer >60 >60 mL/min   Anion gap 10 5 - 15  Lipase, blood  Result Value Ref Range   Lipase 31 11 - 51 U/L  CBC with Differential  Result Value Ref Range   WBC 11.4 (H) 4.0 - 10.5 K/uL   RBC 4.27 3.87 - 5.11 MIL/uL   Hemoglobin 12.1 12.0 - 15.0 g/dL   HCT 37.7 36.0 - 46.0 %   MCV 88.3 78.0 - 100.0 fL   MCH 28.3 26.0 - 34.0 pg   MCHC 32.1 30.0 - 36.0 g/dL   RDW 16.9 (H) 11.5 - 15.5 %   Platelets 174 150 - 400 K/uL   Neutrophils Relative % 50 %   Neutro Abs 5.7 1.7 - 7.7 K/uL   Lymphocytes Relative 42 %   Lymphs Abs 4.8 (H) 0.7 - 4.0 K/uL   Monocytes Relative 6 %   Monocytes Absolute 0.7 0.1 - 1.0 K/uL   Eosinophils Relative 2 %   Eosinophils Absolute 0.2 0.0 - 0.7 K/uL   Basophils Relative 0 %   Basophils Absolute 0.0 0.0 - 0.1 K/uL  Urinalysis, Routine w reflex microscopic  Result Value Ref Range   Color, Urine YELLOW YELLOW   APPearance CLEAR CLEAR  Specific Gravity, Urine 1.015 1.005 - 1.030   pH 7.0 5.0 - 8.0   Glucose, UA NEGATIVE NEGATIVE mg/dL   Hgb urine dipstick NEGATIVE NEGATIVE   Bilirubin Urine NEGATIVE NEGATIVE   Ketones, ur NEGATIVE NEGATIVE mg/dL   Protein, ur NEGATIVE NEGATIVE mg/dL   Nitrite NEGATIVE NEGATIVE   Leukocytes, UA NEGATIVE NEGATIVE   Laboratory interpretation all normal except leukocytosis    EKG   EKG Interpretation None       Radiology Ct Abdomen Pelvis W Contrast  Result Date: 09/24/2016 CLINICAL DATA:  55 year old female with periumbilical abdominal pain. EXAM: CT ABDOMEN AND PELVIS WITH CONTRAST TECHNIQUE: Multidetector CT imaging of the abdomen and pelvis was performed using the standard protocol following bolus administration of intravenous contrast. CONTRAST:  129m ISOVUE-300 IOPAMIDOL (ISOVUE-300) INJECTION 61% COMPARISON:  Abdominal radiograph dated 09/18/2016 and CT dated 06/25/2016 FINDINGS: Lower chest: The visualized lung bases are clear. No intra-abdominal free air or free fluid. Hepatobiliary: There is irregularity of the hepatic contour with enlargement of the left lobe of the liver compatible with morphologic changes of cirrhosis. No intrahepatic biliary ductal dilatation. Cholecystectomy. Pancreas: Unremarkable. No pancreatic ductal dilatation or surrounding inflammatory changes. Spleen: Normal in size without focal abnormality. Adrenals/Urinary Tract: The adrenal glands appear unremarkable. A 3 mm nonobstructing right renal inferior pole stone. No hydronephrosis. The kidneys are otherwise unremarkable. The visualized ureters and urinary bladder are unremarkable as well. Stomach/Bowel: Small duodenal diverticula. There is extensive sigmoid diverticulosis with muscular hypertrophy. No active inflammatory changes. Mild thickened appearance of the jejunal folds likely physiologic. Mild enteritis is not entirely excluded. No evidence of small-bowel obstruction. Normal appendix. Vascular/Lymphatic: Mild aortoiliac atherosclerotic disease. The abdominal aorta and IVC are otherwise unremarkable. No portal venous gas identified. Top-normal portacaval lymph nodes. Reproductive: Hysterectomy. The right ovary is not well visualized. Previously seen right ovarian mass is not seen on the current study and likely surgically absent. The left ovary is grossly unremarkable. Other: Stable appearing  postsurgical changes of anterior abdominal wall and ventral hernia repair mesh. Fat containing umbilical hernia with chronic appearing inflammatory changes similar to prior CT. No fluid collection or abscess. Musculoskeletal: Mild degenerative changes of the spine. No acute fracture. IMPRESSION: Mild thickened appearance of the jejunal folds may represent mild enteritis. Clinical correlation is recommended. No bowel obstruction. Extensive sigmoid diverticulosis without active inflammatory changes. Normal appendix. Nonvisualization of the previously seen right ovarian mass, likely surgically absent. Postsurgical changes of anterior abdominal wall with hernia repair mesh and small fat containing umbilical hernia with chronic appearing inflammatory changes similar in appearance to prior CT. No fluid collection or abscess. Electronically Signed   By: AAnner CreteM.D.   On: 09/24/2016 03:54    Procedures Procedures (including critical care time)  Medications Ordered in ED Medications  sodium chloride 0.9 % bolus 1,000 mL (0 mLs Intravenous Stopped 09/24/16 0333)  sodium chloride 0.9 % bolus 500 mL (0 mLs Intravenous Stopped 09/24/16 0446)  fentaNYL (SUBLIMAZE) injection 50 mcg (50 mcg Intravenous Given 09/24/16 0222)  ondansetron (ZOFRAN) injection 4 mg (4 mg Intravenous Given 09/24/16 0222)  iopamidol (ISOVUE-300) 61 % injection 100 mL (100 mLs Intravenous Contrast Given 09/24/16 0312)     Initial Impression / Assessment and Plan / ED Course  I have reviewed the triage vital signs and the nursing notes.  Pertinent labs & imaging results that were available during my care of the patient were reviewed by me and considered in my medical decision making (see chart for details).  Clinical  Course    Patient was given IV fluids, IV pain and nausea medication. CT scan the abdomen was done since she insists this is a new pain she's never had before.  4:30 AM I discussed patient's test results of  her. When I went in the room she was resting comfortably playing on her cell phone in no distress. Patient was advised to do symptomatic treatment at home. She should do clear liquids for the next 12-24 hours and then eat a bland diet. She states she sees Dr. Rehman,gastoenterologist. She can follow-up with him if she's not improving.  Review of her radiology studies show she's had about 14 abdominal/pelvic CT scans in the past couple years.   Review of the [place] shows patient has gotten 8 narcotic prescriptions since August 11. They vary between quantities of 15-30 with hydrocodone 5/325 and oxycodone 5/325 and tramadol. These have been prescribed by her gynecologist, our emergency department and her PCP.  Final Clinical Impressions(s) / ED Diagnoses   Final diagnoses:  Periumbilical abdominal pain    New Prescriptions New Prescriptions   ONDANSETRON (ZOFRAN) 4 MG TABLET    Take 1 tablet (4 mg total) by mouth every 8 (eight) hours as needed for nausea or vomiting.   PANTOPRAZOLE (PROTONIX) 20 MG TABLET    Take 1 po BID x 2 weeks then once a day    Plan discharge  Rolland Porter, MD, Barbette Or, MD 09/24/16 [phone]

## 2016-09-24 NOTE — Discharge Instructions (Signed)
Drink plenty of fluids the next 24 hours. If you doing well this afternoon you can go to a bland diet such as toast, crackers, Jell-O, or [employer] chicken noodle soup. Do that for a couple of days. Avoid anything fried, spicy, or greasy. Increase her Protonix to twice a day for the next 2 weeks then back down to once a day. If you continue to have abdominal discomfort, please follow-up with Dr. Laural Golden, your gastroenterologist.

## 2016-09-24 NOTE — ED Notes (Signed)
Patient transported to CT 

## 2016-09-25 ENCOUNTER — Encounter (INDEPENDENT_AMBULATORY_CARE_PROVIDER_SITE_OTHER): Payer: Self-pay | Admitting: Internal Medicine

## 2016-09-25 ENCOUNTER — Ambulatory Visit (INDEPENDENT_AMBULATORY_CARE_PROVIDER_SITE_OTHER): Payer: Medicare Other | Admitting: Internal Medicine

## 2016-09-25 VITALS — BP 100/84 | HR 72 | Temp 98.4°F | Ht 62.0 in | Wt 190.1 lb

## 2016-09-25 DIAGNOSIS — K529 Noninfective gastroenteritis and colitis, unspecified: Secondary | ICD-10-CM | POA: Diagnosis not present

## 2016-09-25 DIAGNOSIS — E2839 Other primary ovarian failure: Secondary | ICD-10-CM | POA: Diagnosis not present

## 2016-09-25 MED ORDER — CIPROFLOXACIN HCL 500 MG PO TABS: 500.0000 mg | ORAL_TABLET | Freq: Two times a day (BID) | ORAL | 0 refills | Status: DC

## 2016-09-25 NOTE — Patient Instructions (Signed)
Rx for Cipro sent to her pharmacy. Clear liquid x 2 days, then advance diet.

## 2016-09-25 NOTE — Progress Notes (Signed)
Subjective:    Patient ID: [name], female    DOB: 1961/03/05, 55 y.o.   MRN: [id number]  HPI Here today for f/u after visit to ED yesterday for abdominal pain. She underwent a CT which revealed IMPRESSION: Mild thickened appearance of the jejunal folds may represent mild enteritis. Clinical correlation is recommended. No bowel obstruction.  Extensive sigmoid diverticulosis without active inflammatory changes. Normal appendix.  Nonvisualization of the previously seen right ovarian mass, likely surgically absent.  Postsurgical changes of anterior abdominal wall with hernia repair mesh and small fat containing umbilical hernia with chronic appearing inflammatory changes similar in appearance to prior CT. No fluid collection or abscess.  She was discharged from the ED. She was in no distress while in the ED and was actually found to be playing on her phone at discharge. She received IV and pain medication. Dr. Laural Golden reviewed the CT and advised to start on Cipro  for 7 days. (She is allergic to Flagyl). She has mid umblical pain. Pain radiates into her back. She has some nausea and small amt of vomiting. There has been no fever. She has BM 2-3 times a day.    She has a hx of NAFLD/cirrhosis.   Hx of chronic abdominal pain with multiple CTs reports.   Urinalysis    Component Value Date/Time   COLORURINE YELLOW 09/24/2016 0249   APPEARANCEUR CLEAR 09/24/2016 0249   LABSPEC 1.015 09/24/2016 0249   PHURINE 7.0 09/24/2016 0249   GLUCOSEU NEGATIVE 09/24/2016 0249   HGBUR NEGATIVE 09/24/2016 0249   BILIRUBINUR NEGATIVE 09/24/2016 0249   KETONESUR NEGATIVE 09/24/2016 0249   PROTEINUR NEGATIVE 09/24/2016 0249   UROBILINOGEN 0.2 06/25/2014 1750   NITRITE NEGATIVE 09/24/2016 0249   LEUKOCYTESUR NEGATIVE 09/24/2016 0249   CBC    Component Value Date/Time   WBC 11.4 (H) 09/24/2016 0218   RBC 4.27 09/24/2016 0218   HGB 12.1 09/24/2016 0218   HCT 37.7 09/24/2016 0218   PLT 174 09/24/2016 0218   MCV 88.3 09/24/2016 0218   MCH 28.3 09/24/2016 0218   MCHC 32.1 09/24/2016 0218   RDW 16.9 (H) 09/24/2016 0218   LYMPHSABS 4.8 (H) 09/24/2016 0218   MONOABS 0.7 09/24/2016 0218   EOSABS 0.2 09/24/2016 0218   BASOSABS 0.0 09/24/2016 0218   Hepatic Function Panel     Component Value Date/Time   PROT 7.9 09/24/2016 0218   ALBUMIN 3.5 09/24/2016 0218   AST 39 09/24/2016 0218   ALT 29 09/24/2016 0218   ALKPHOS 107 09/24/2016 0218   BILITOT 0.3 09/24/2016 0218   BILIDIR 0.1 09/11/2016 0953   IBILI 0.3 09/11/2016 0953       Review of Systems Past Medical History:  Diagnosis Date  . Anxiety disorder   . Chronic low back pain   . Cirrhosis of liver ([place])   . Colitis   . Diabetes mellitus   . Diabetic neuropathy ([place])   . Diverticulitis   . Fatty liver   . GERD (gastroesophageal reflux disease)   . High cholesterol   . Hypertension   . Irritable bowel syndrome   . Meniere's disease   . Neuropathy ([place])   . Pain     Past Surgical History:  Procedure Laterality Date  . ABDOMINAL HYSTERECTOMY    . bladder tact  2005  . CHOLECYSTECTOMY  08/11  . COLONOSCOPY  2208  . COLONOSCOPY  In of September 2014   @ [facility]/Dr,.[name]  . [place]  . HERNIA  REPAIR  09/17/11  . [place]  2009   Preformed on the right shoulder  . OVARIAN CYST REMOVAL     right side  . PARTIAL HYSTERECTOMY  2005  . UPPER GASTROINTESTINAL ENDOSCOPY  2008  . URETERAL STENT PLACEMENT      Allergies  Allergen Reactions  . Flagyl [Metronidazole Hcl] Itching    Rash,itch  . Nsaids     nausea    Current Outpatient Prescriptions on File Prior to Visit  Medication Sig Dispense Refill  . acetaminophen (TYLENOL) 500 MG tablet Take 500 mg by mouth every 6 (six) hours as needed for mild pain.    Marland Kitchen acetaZOLAMIDE (DIAMOX) 250 MG tablet Take 250 mg by mouth daily.     Marland Kitchen acyclovir (ZOVIRAX) 400 MG tablet Take 1 tablet by mouth 3 (three) times daily as needed  (fever blisters).     Marland Kitchen atorvastatin (LIPITOR) 20 MG tablet Take 1 tablet (20 mg total) by mouth daily. 90 tablet 3  . cyanocobalamin (,VITAMIN B-12,) 1000 MCG/ML injection Inject 1,000 mcg into the muscle every 30 (thirty) days.  5  . diazepam (VALIUM) 5 MG tablet Take 2.5 mg by mouth every 12 (twelve) hours as needed for anxiety.     . DULoxetine (CYMBALTA) 60 MG capsule Take 60 mg by mouth daily.    . ferrous sulfate 325 (65 FE) MG tablet Take 1 tablet (325 mg total) by mouth daily. 30 tablet 0  . fluticasone (FLONASE) 50 MCG/ACT nasal spray Place 2 sprays into both nostrils every morning.     Marland Kitchen glucose blood test strip Use as instructed 4 times daily. E11.65. TrueTrack test strips 150 each 5  . HYDROcodone-acetaminophen (NORCO/VICODIN) 5-325 MG tablet Take 1 tablet by mouth every 6 (six) hours as needed. (Patient taking differently: Take 1 tablet by mouth every 6 (six) hours as needed for moderate pain. ) 30 tablet 0  . ibuprofen (ADVIL,MOTRIN) 600 MG tablet Take 1 tablet (600 mg total) by mouth every 6 (six) hours as needed. (Patient taking differently: Take 600 mg by mouth every 6 (six) hours as needed for moderate pain. ) 30 tablet 0  . metFORMIN (GLUCOPHAGE) 1000 MG tablet TAKE ONE TABLET BY MOUTH TWICE DAILY WITH A MEAL. 60 tablet 2  . metoprolol (LOPRESSOR) 50 MG tablet Take 25 mg by mouth daily. Patient takes 1/2 tablet daily    . ondansetron (ZOFRAN) 4 MG tablet Take 1 tablet (4 mg total) by mouth every 8 (eight) hours as needed for nausea or vomiting. 12 tablet 0  . pantoprazole (PROTONIX) 20 MG tablet Take 1 po BID x 2 weeks then once a day 60 tablet 0  . traMADol (ULTRAM) 50 MG tablet Take 50 mg by mouth 3 (three) times daily as needed for moderate pain or severe pain.     . TRESIBA FLEXTOUCH 200 UNIT/ML SOPN INJECT 50 UNITS SUBCUTANEOUSLY AT BEDTIME.  4   No current facility-administered medications on file prior to visit.        Objective:   Physical Exam Blood pressure 100/84,  pulse 72, temperature 98.4 F (36.9 C), height 5' 2"  (1.575 m), weight 190 lb 1.6 oz (86.2 kg).  Alert and oriented. Skin warm and dry. Oral mucosa is moist.   . Sclera anicteric, conjunctivae is pink. Thyroid not enlarged. No cervical lymphadenopathy. Lungs clear. Heart regular rate and rhythm.  Abdomen is soft. Bowel sounds are positive. No hepatomegaly. No abdominal masses felt. No tenderness.  No edema to lower extremities.  Assessment & Plan:  Enteritis: am going to start her on Cipro 517m BID x 7 days. Clear liquids x 2 day and then advance diet.

## 2016-09-26 DIAGNOSIS — M159 Polyosteoarthritis, unspecified: Secondary | ICD-10-CM | POA: Diagnosis not present

## 2016-09-26 DIAGNOSIS — E119 Type 2 diabetes mellitus without complications: Secondary | ICD-10-CM | POA: Diagnosis not present

## 2016-09-26 DIAGNOSIS — I10 Essential (primary) hypertension: Secondary | ICD-10-CM | POA: Diagnosis not present

## 2016-10-01 ENCOUNTER — Other Ambulatory Visit: Payer: Self-pay | Admitting: "Endocrinology

## 2016-10-01 ENCOUNTER — Encounter (INDEPENDENT_AMBULATORY_CARE_PROVIDER_SITE_OTHER): Payer: Self-pay | Admitting: *Deleted

## 2016-10-01 ENCOUNTER — Other Ambulatory Visit (INDEPENDENT_AMBULATORY_CARE_PROVIDER_SITE_OTHER): Payer: Self-pay | Admitting: *Deleted

## 2016-10-01 DIAGNOSIS — K589 Irritable bowel syndrome without diarrhea: Secondary | ICD-10-CM

## 2016-10-01 DIAGNOSIS — E1165 Type 2 diabetes mellitus with hyperglycemia: Secondary | ICD-10-CM | POA: Diagnosis not present

## 2016-10-01 DIAGNOSIS — K760 Fatty (change of) liver, not elsewhere classified: Secondary | ICD-10-CM | POA: Diagnosis not present

## 2016-10-01 DIAGNOSIS — E118 Type 2 diabetes mellitus with unspecified complications: Secondary | ICD-10-CM | POA: Diagnosis not present

## 2016-10-01 DIAGNOSIS — E7800 Pure hypercholesterolemia, unspecified: Secondary | ICD-10-CM | POA: Diagnosis not present

## 2016-10-01 DIAGNOSIS — Z794 Long term (current) use of insulin: Secondary | ICD-10-CM | POA: Diagnosis not present

## 2016-10-01 LAB — CBC
HEMATOCRIT: 39.7 % (ref 35.0–45.0)
Hemoglobin: 12.7 g/dL (ref 11.7–15.5)
MCH: 28 pg (ref 27.0–33.0)
MCHC: 32 g/dL (ref 32.0–36.0)
MCV: 87.6 fL (ref 80.0–100.0)
MPV: 10.5 fL (ref 7.5–12.5)
Platelets: 205 10*3/uL (ref 140–400)
RBC: 4.53 MIL/uL (ref 3.80–5.10)
RDW: 16.5 % — AB (ref 11.0–15.0)
WBC: 8.5 10*3/uL (ref 3.8–10.8)

## 2016-10-01 LAB — COMPREHENSIVE METABOLIC PANEL
ALT: 19 U/L (ref 6–29)
AST: 32 U/L (ref 10–35)
Albumin: 3.7 g/dL (ref 3.6–5.1)
Alkaline Phosphatase: 109 U/L (ref 33–130)
BUN: 14 mg/dL (ref 7–25)
CO2: 20 mmol/L (ref 20–31)
Calcium: 8.8 mg/dL (ref 8.6–10.4)
Chloride: 105 mmol/L (ref 98–110)
Creat: 0.66 mg/dL (ref 0.50–1.05)
Glucose, Bld: 124 mg/dL — ABNORMAL HIGH (ref 65–99)
POTASSIUM: 4.3 mmol/L (ref 3.5–5.3)
Sodium: 139 mmol/L (ref 135–146)
Total Bilirubin: 0.5 mg/dL (ref 0.2–1.2)
Total Protein: 7.5 g/dL (ref 6.1–8.1)

## 2016-10-01 LAB — HEPATIC FUNCTION PANEL
ALT: 20 U/L (ref 6–29)
AST: 31 U/L (ref 10–35)
Albumin: 3.7 g/dL (ref 3.6–5.1)
Alkaline Phosphatase: 109 U/L (ref 33–130)
BILIRUBIN DIRECT: 0.1 mg/dL (ref <=0.2)
Indirect Bilirubin: 0.4 mg/dL (ref 0.2–1.2)
TOTAL PROTEIN: 7.4 g/dL (ref 6.1–8.1)
Total Bilirubin: 0.5 mg/dL (ref 0.2–1.2)

## 2016-10-01 LAB — HEMOGLOBIN A1C
Hgb A1c MFr Bld: 7 % — ABNORMAL HIGH (ref <5.7)
Mean Plasma Glucose: 154 mg/dL

## 2016-10-04 IMAGING — US US TRANSVAGINAL NON-OB
1 series · 14 of 25 positions shown · non-contrast
Comparison: 06/25/2016, 01/31/2015

CLINICAL DATA: Right lower quadrant pain and prominent right ovary
on recent CT



[Series 1: us transvaginal non-ob · 0.24mm/px · 54 acquisitions, 14 frames shown]
[im 1/54]
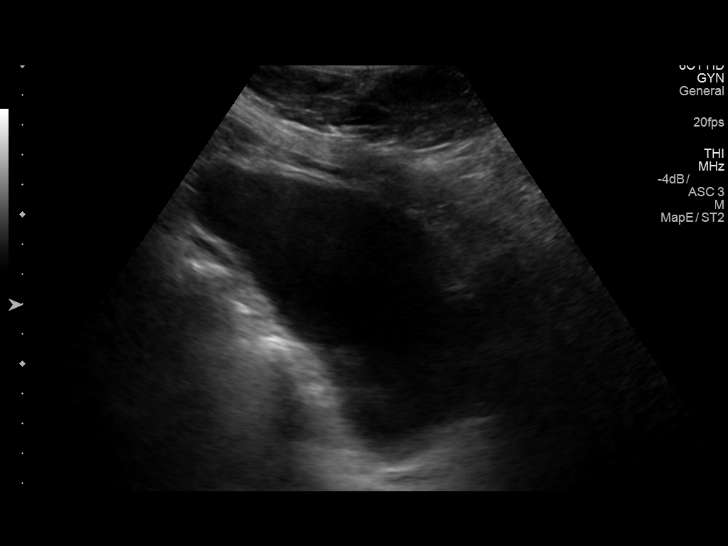
[im 5/54]
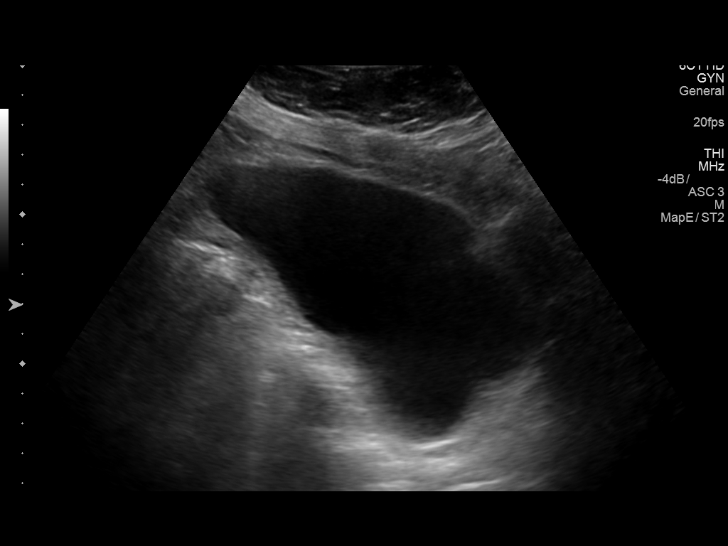
[im 9/54]
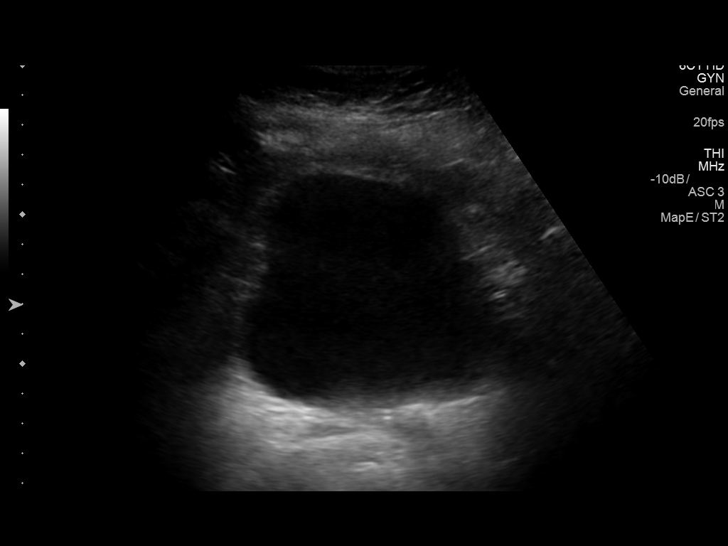
[im 14/54]
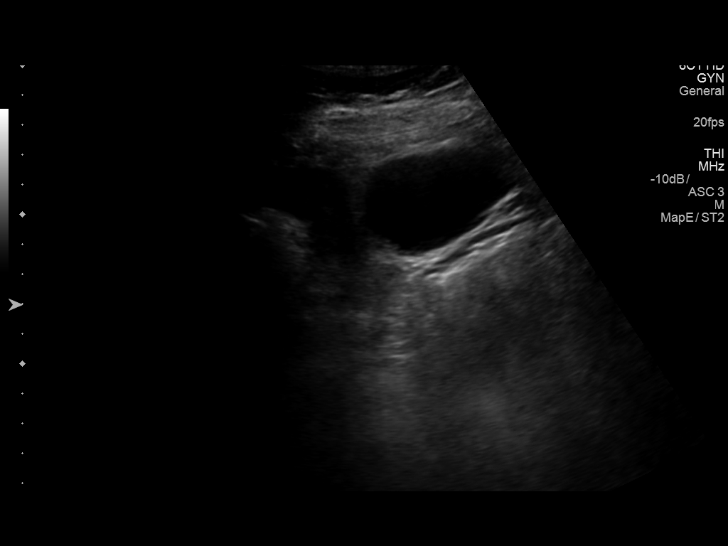
[im 18/54]
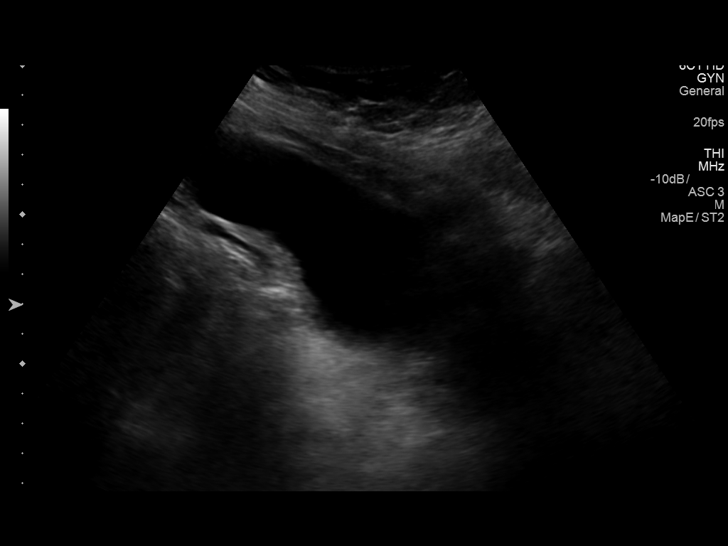
[im 20/54]
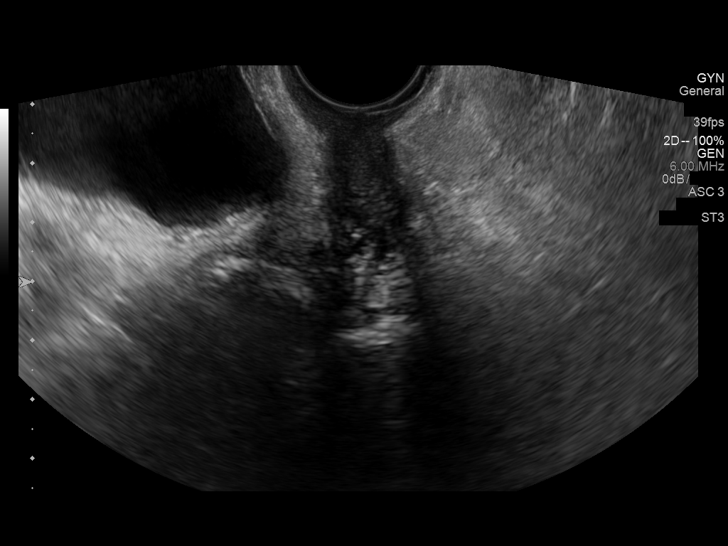
[im 25/54]
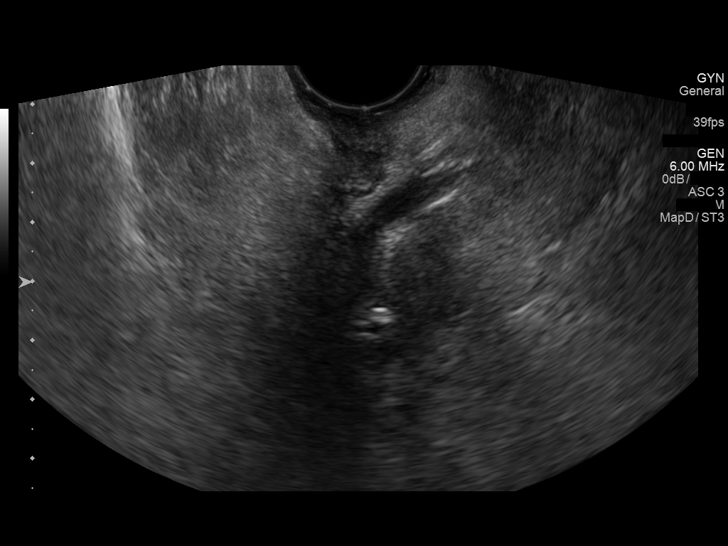
[im 29/54]
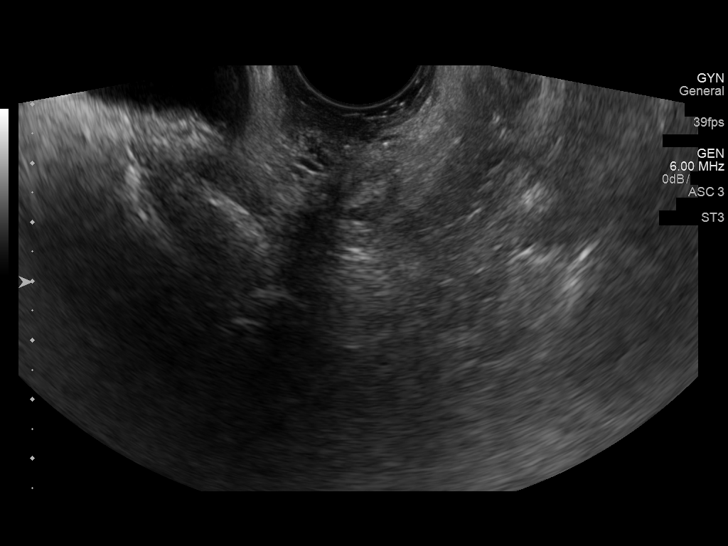
[im 34/54]
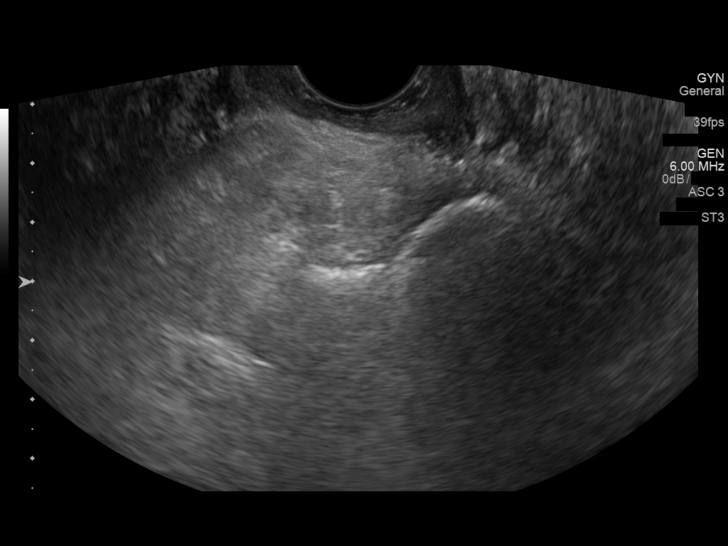
[im 36/54]
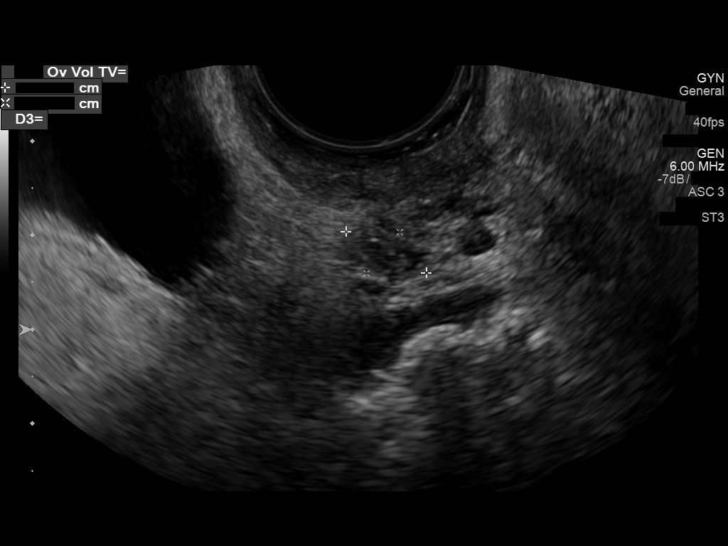
[im 40/54]
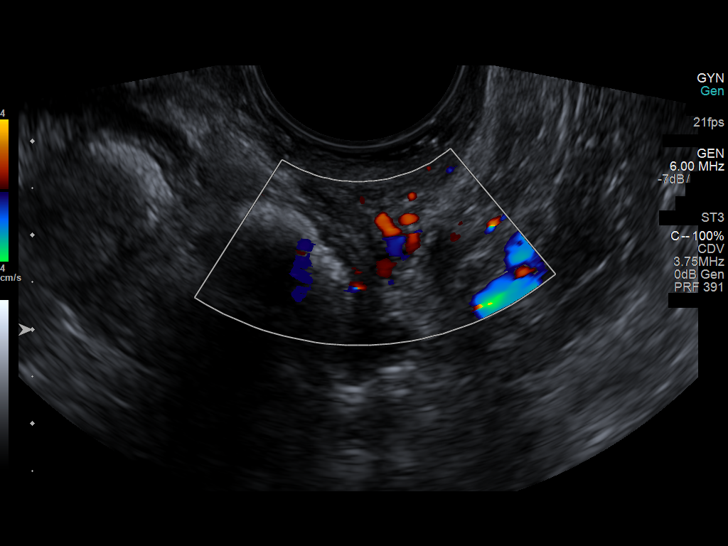
[im 45/54]
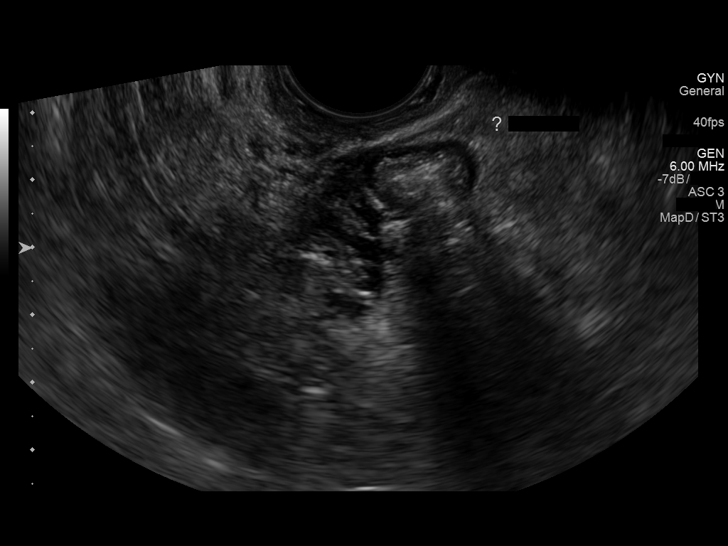
[im 49/54]
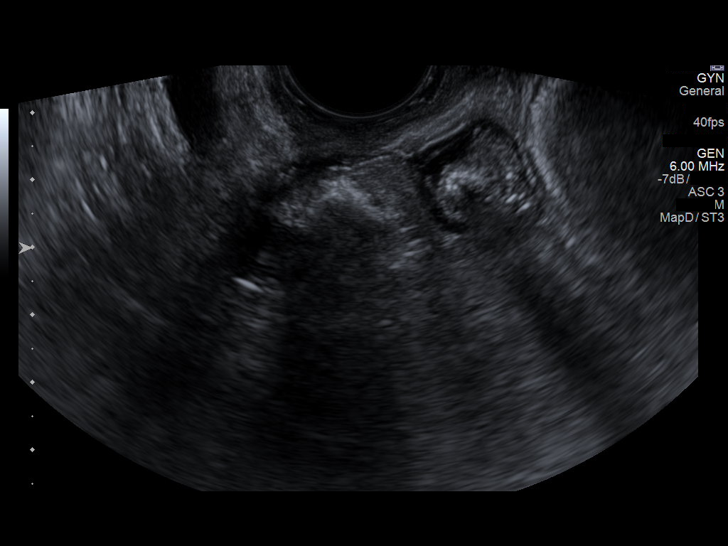
[im 54/54]
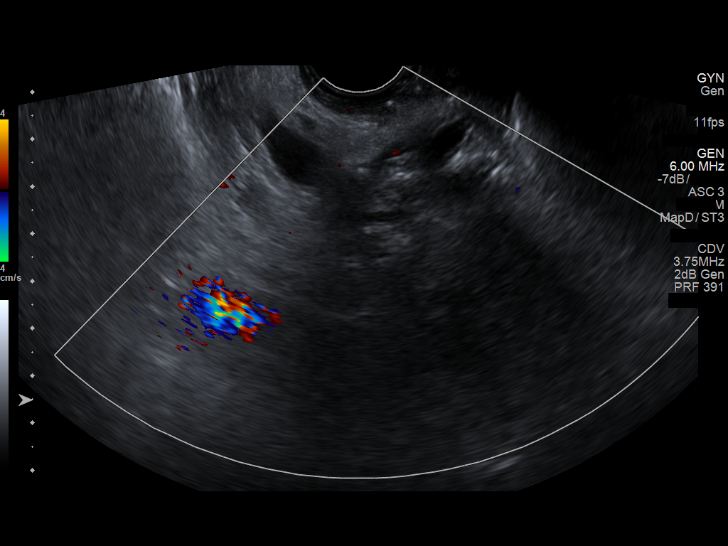

[14 of 25 positions shown; findings below may reference images not displayed]

FINDINGS: Uterus

Surgically removed

Right ovary

Measurements: 3.2 x 3.1 x 2.7 cm.. The ovaries somewhat complex in
appearance although no definitive mass lesion is seen. There has
been some mild growth over the last year when compared with the
study from 9207.

Left ovary

Measurements: 1.0 x 0.6 x 0.6 cm.. Normal appearance/no adnexal
mass.

Other findings

No abnormal free fluid.
IMPRESSION: Status post hysterectomy.

Prominent right ovary as described. There has been some growth over
the past year although this is of uncertain significance. Continued
follow-up is recommended with pelvic ultrasound.

## 2016-10-09 ENCOUNTER — Encounter: Payer: Self-pay | Admitting: "Endocrinology

## 2016-10-09 ENCOUNTER — Encounter: Payer: Medicare Other | Attending: "Endocrinology | Admitting: Nutrition

## 2016-10-09 ENCOUNTER — Ambulatory Visit (INDEPENDENT_AMBULATORY_CARE_PROVIDER_SITE_OTHER): Payer: Medicare Other | Admitting: "Endocrinology

## 2016-10-09 VITALS — BP 117/78 | HR 78 | Ht 61.0 in | Wt 188.0 lb

## 2016-10-09 DIAGNOSIS — E669 Obesity, unspecified: Secondary | ICD-10-CM

## 2016-10-09 DIAGNOSIS — E7800 Pure hypercholesterolemia, unspecified: Secondary | ICD-10-CM

## 2016-10-09 DIAGNOSIS — Z713 Dietary counseling and surveillance: Secondary | ICD-10-CM | POA: Insufficient documentation

## 2016-10-09 DIAGNOSIS — Z794 Long term (current) use of insulin: Secondary | ICD-10-CM

## 2016-10-09 DIAGNOSIS — E118 Type 2 diabetes mellitus with unspecified complications: Secondary | ICD-10-CM | POA: Diagnosis not present

## 2016-10-09 DIAGNOSIS — E1169 Type 2 diabetes mellitus with other specified complication: Secondary | ICD-10-CM | POA: Diagnosis not present

## 2016-10-09 DIAGNOSIS — E1165 Type 2 diabetes mellitus with hyperglycemia: Secondary | ICD-10-CM | POA: Diagnosis not present

## 2016-10-09 DIAGNOSIS — I10 Essential (primary) hypertension: Secondary | ICD-10-CM

## 2016-10-09 DIAGNOSIS — IMO0002 Reserved for concepts with insufficient information to code with codable children: Secondary | ICD-10-CM

## 2016-10-09 NOTE — Patient Instructions (Addendum)
Goals 1.  Get A1C less than 7% 2. Exercise 60 minutes 4 days or more per week 3. Lose 1-2 lbs per week 4. Make sure eating 30 grams of carbs per meal and 50% of plate of low carb vegetables.

## 2016-10-09 NOTE — Progress Notes (Signed)
Subjective:    Patient ID: [name], female    DOB: 12/01/1960. Patient is being seen in f/u for management of diabetes requested by  Glenda Chroman, MD  Past Medical History:  Diagnosis Date  . Anxiety disorder   . Chronic low back pain   . Cirrhosis of liver ([place])   . Colitis   . Diabetes mellitus   . Diabetic neuropathy ([place])   . Diverticulitis   . Fatty liver   . GERD (gastroesophageal reflux disease)   . High cholesterol   . Hypertension   . Irritable bowel syndrome   . Meniere's disease   . Neuropathy ([place])   . Pain    Past Surgical History:  Procedure Laterality Date  . ABDOMINAL HYSTERECTOMY    . bladder tact  2005  . CHOLECYSTECTOMY  08/11  . COLONOSCOPY  2208  . COLONOSCOPY  In of September 2014   @ [facility]/Dr,.[name]  . [place]  . HERNIA REPAIR  09/17/11  . [place]  2009   Preformed on the right shoulder  . OVARIAN CYST REMOVAL     right side  . PARTIAL HYSTERECTOMY  2005  . UPPER GASTROINTESTINAL ENDOSCOPY  2008  . URETERAL STENT PLACEMENT     Social History   Social History  . Marital status: Divorced    Spouse name: N/A  . Number of children: N/A  . Years of education: N/A   Social History Main Topics  . Smoking status: Never Smoker  . Smokeless tobacco: Never Used  . Alcohol use No  . Drug use: No  . Sexual activity: Yes    Birth control/ protection: Surgical   Other Topics Concern  . None   Social History Narrative  . None   Outpatient Encounter Prescriptions as of 10/09/2016  Medication Sig  . acetaminophen (TYLENOL) 500 MG tablet Take 500 mg by mouth every 6 (six) hours as needed for mild pain.  Marland Kitchen acetaZOLAMIDE (DIAMOX) 250 MG tablet Take 250 mg by mouth daily.   Marland Kitchen acyclovir (ZOVIRAX) 400 MG tablet Take 1 tablet by mouth 3 (three) times daily as needed (fever blisters).   Marland Kitchen atorvastatin (LIPITOR) 20 MG tablet Take 1 tablet (20 mg total) by mouth daily.  . ciprofloxacin (CIPRO) 500 MG tablet Take 1 tablet  (500 mg total) by mouth 2 (two) times daily.  . cyanocobalamin (,VITAMIN B-12,) 1000 MCG/ML injection Inject 1,000 mcg into the muscle every 30 (thirty) days.  . diazepam (VALIUM) 5 MG tablet Take 2.5 mg by mouth every 12 (twelve) hours as needed for anxiety.   . DULoxetine (CYMBALTA) 60 MG capsule Take 60 mg by mouth daily.  . ferrous sulfate 325 (65 FE) MG tablet Take 1 tablet (325 mg total) by mouth daily.  . fluticasone (FLONASE) 50 MCG/ACT nasal spray Place 2 sprays into both nostrils every morning.   Marland Kitchen glucose blood test strip Use as instructed 4 times daily. E11.65. TrueTrack test strips  . HYDROcodone-acetaminophen (NORCO/VICODIN) 5-325 MG tablet Take 1 tablet by mouth every 6 (six) hours as needed. (Patient taking differently: Take 1 tablet by mouth every 6 (six) hours as needed for moderate pain. )  . ibuprofen (ADVIL,MOTRIN) 600 MG tablet Take 1 tablet (600 mg total) by mouth every 6 (six) hours as needed. (Patient taking differently: Take 600 mg by mouth every 6 (six) hours as needed for moderate pain. )  . metFORMIN (GLUCOPHAGE) 1000 MG tablet TAKE ONE TABLET BY MOUTH TWICE  DAILY WITH A MEAL.  . metoprolol (LOPRESSOR) 50 MG tablet Take 25 mg by mouth daily. Patient takes 1/2 tablet daily  . ondansetron (ZOFRAN) 4 MG tablet Take 1 tablet (4 mg total) by mouth every 8 (eight) hours as needed for nausea or vomiting.  . pantoprazole (PROTONIX) 20 MG tablet Take 1 po BID x 2 weeks then once a day  . traMADol (ULTRAM) 50 MG tablet Take 50 mg by mouth 3 (three) times daily as needed for moderate pain or severe pain.   . TRESIBA FLEXTOUCH 200 UNIT/ML SOPN INJECT 50 UNITS SUBCUTANEOUSLY AT BEDTIME.   No facility-administered encounter medications on file as of 10/09/2016.    ALLERGIES: Allergies  Allergen Reactions  . Flagyl [Metronidazole Hcl] Itching    Rash,itch  . Nsaids     nausea   VACCINATION STATUS:  There is no immunization history on file for this patient.  Diabetes  She  presents for her follow-up diabetic visit. She has type 2 diabetes mellitus. Onset time: She was diagnosed approximately at age 58 years. Her disease course has been improving. There are no hypoglycemic associated symptoms. Pertinent negatives for hypoglycemia include no confusion, headaches, pallor or seizures. Associated symptoms include fatigue. Pertinent negatives for diabetes include no chest pain, no polydipsia, no polyphagia and no polyuria. There are no hypoglycemic complications. Symptoms are improving. There are no diabetic complications. Risk factors for coronary artery disease include diabetes mellitus, dyslipidemia, hypertension, obesity, sedentary lifestyle and family history. Current diabetic treatment includes oral agent (monotherapy) and intensive insulin program (She is on Tresiba 130 units every morning, NovoLog 15 units 3 times a day before meals, Glucovance 5/500 3 times a day, Actos 15 mg by mouth daily.). Her weight is decreasing steadily. She is following a generally unhealthy diet. When asked about meal planning, she reported none. She has not had a previous visit with a dietitian. She never participates in exercise. Her home blood glucose trend is fluctuating dramatically. Her breakfast blood glucose range is generally 140-180 mg/dl. Her lunch blood glucose range is generally 140-180 mg/dl. Her dinner blood glucose range is generally 140-180 mg/dl. Her overall blood glucose range is 140-180 mg/dl. An ACE inhibitor/angiotensin II receptor blocker is not being taken. Eye exam is current.  Hypertension  This is a chronic problem. The current episode started more than 1 year ago. The problem is controlled. Pertinent negatives include no chest pain, headaches, palpitations or shortness of breath. Risk factors for coronary artery disease include diabetes mellitus, obesity and sedentary lifestyle. Past treatments include beta blockers. Compliance problems include diet and exercise.    Hyperlipidemia  Pertinent negatives include no chest pain, myalgias or shortness of breath.    Review of Systems  Constitutional: Positive for fatigue. Negative for unexpected weight change.  HENT: Negative for trouble swallowing and voice change.   Eyes: Negative for visual disturbance.  Respiratory: Negative for cough, shortness of breath and wheezing.   Cardiovascular: Negative for chest pain, palpitations and leg swelling.  Gastrointestinal: Negative for diarrhea, nausea and vomiting.  Endocrine: Negative for cold intolerance, heat intolerance, polydipsia, polyphagia and polyuria.  Genitourinary: Negative for dysuria, flank pain and frequency.  Musculoskeletal: Negative for arthralgias and myalgias.  Skin: Negative for color change, pallor, rash and wound.  Neurological: Negative for seizures and headaches.  Psychiatric/Behavioral: Negative for confusion and suicidal ideas.    Objective:    BP 117/78   Pulse 78   Ht 5' 1"  (1.549 m)   Wt 188 lb (85.3 kg)  BMI 35.52 kg/m   Wt Readings from Last 3 Encounters:  10/09/16 188 lb (85.3 kg)  09/25/16 190 lb 1.6 oz (86.2 kg)  09/24/16 184 lb (83.5 kg)    Physical Exam  Constitutional: She is oriented to person, place, and time. She appears well-developed.  Obese.  HENT:  Head: Normocephalic and atraumatic.  Eyes: EOM are normal.  Neck: Normal range of motion. Neck supple. No tracheal deviation present. No thyromegaly present.  Cardiovascular: Normal rate and regular rhythm.   Pulmonary/Chest: Effort normal and breath sounds normal.  Abdominal: Soft. Bowel sounds are normal. There is no tenderness. There is no guarding.  Musculoskeletal: Normal range of motion. She exhibits no edema.  Neurological: She is alert and oriented to person, place, and time. She has normal reflexes. No cranial nerve deficit. Coordination normal.  Skin: Skin is warm and dry. No rash noted. No erythema. No pallor.  Psychiatric: She has a normal mood  and affect. Judgment normal.     CMP     Component Value Date/Time   NA 139 10/01/2016 1222   K 4.3 10/01/2016 1222   CL 105 10/01/2016 1222   CO2 20 10/01/2016 1222   GLUCOSE 124 (H) 10/01/2016 1222   BUN 14 10/01/2016 1222   CREATININE 0.66 10/01/2016 1222   CALCIUM 8.8 10/01/2016 1222   PROT 7.5 10/01/2016 1222   ALBUMIN 3.7 10/01/2016 1222   AST 32 10/01/2016 1222   ALT 19 10/01/2016 1222   ALKPHOS 109 10/01/2016 1222   BILITOT 0.5 10/01/2016 1222   GFRNONAA >60 09/24/2016 0218   GFRNONAA >89 06/30/2016 1251   GFRAA >60 09/24/2016 0218   GFRAA >89 06/30/2016 1251     Diabetic Labs (most recent): Lab Results  Component Value Date   HGBA1C 7.0 (H) 10/01/2016   HGBA1C 7.5 (H) 06/30/2016   HGBA1C 8.5 02/13/2016     Assessment & Plan:   1. Uncontrolled type 2 diabetes mellitus with complication, with long-term current use of insulin ([place]) - Patient has currently uncontrolled symptomatic type 2 DM since  56 years of age. - She came with improved blood glucose profile, A1c improving to 7.0% from 8.7%.  Recent labs reviewed, showing hyperlipidemia and normal renal function.   Her diabetes is complicated by obesity, insulin resistance, and patient remains at a high risk for more acute and chronic complications of diabetes which include CAD, CVA, CKD, retinopathy, and neuropathy. These are all discussed in detail with the patient.  - I have counseled the patient on diet management and weight loss, by adopting a carbohydrate restricted/protein rich diet.  - Suggestion is made for patient to avoid simple carbohydrates   from their diet including Cakes , Desserts, Ice Cream,  Soda (  diet and regular) , Sweet Tea , Candies,  Chips, Cookies, Artificial Sweeteners,   and "Sugar-free" Products . This will help patient to have stable blood glucose profile and potentially avoid unintended weight gain.  - I encouraged the patient to switch to  unprocessed or minimally processed  complex starch and increased protein intake (animal or plant source), fruits, and vegetables.  - Patient is advised to stick to a routine mealtimes to eat 3 meals  a day and avoid unnecessary snacks ( to snack only to correct hypoglycemia).  - The patient will be scheduled with Jearld Fenton, RDN, CDE for individualized DM education.  - I have approached patient with the following individualized plan to manage diabetes and patient agrees:   -  She  returns with  near target fasting blood glucose profile , with A1c improving to 7%. - I  will continue basal insulinTresiba  50 units QHS ( previously took 130 units of basal insulin and 45 units of prandial insulin daily), and  Continue to hold prandial insulin for now, and continue monitoring of glucose  AC  breakfast  and HS. - Patient is warned not to take insulin without proper monitoring per orders. -Adjustment parameters are given for hypo and hyperglycemia in writing. -Patient is encouraged to call clinic for blood glucose levels less than 70 or above 200 mg /dl. - I will continue metformin  1000 mg by mouth twice a day ,therapeutically suitable for patient.  -  She has history of intolerance for [place], likely would not tolerate other  incretin therapy.  - Patient specific target  A1c;  LDL, HDL, Triglycerides, and  Waist Circumference were discussed in detail.  2) BP/HTN:  controlled. Continue current medications . 3) Lipids/HPL:   Uncontrolled, LDL improving to 119 from 141, I discussed and initiated atorvastatin 20 mg by mouth daily at bedtime during her last visit with me, and will continue. Side effects and precautions discussed with her.  4)  Weight/Diet: CDE Consult has been initiated , she has lost 12 pounds since last July, exercise, and detailed carbohydrates information provided.  5) Chronic Care/Health Maintenance:  -Patient  will be considered for ACE inhibitor's and statins as appropriate, and encouraged to continue to  follow up with Ophthalmology, Podiatrist at least yearly or according to recommendations, and advised to   stay away from smoking. I have recommended yearly flu vaccine and pneumonia vaccination at least every 5 years; moderate intensity exercise for up to 150 minutes weekly; and  sleep for at least 7 hours a day.  - 25 minutes of time was spent on the care of this patient , 50% of which was applied for counseling on diabetes complications and their preventions.  - Patient to bring meter and  blood glucose logs during their next visit.   - I advised patient to maintain close follow up with Glenda Chroman, MD for primary care needs.  Follow up plan: - Return for meter, and logs.  Glade Lloyd, MD Phone: [phone]  Fax: [phone]   10/09/2016, 10:20 AM

## 2016-10-09 NOTE — Progress Notes (Signed)
d Diabetes Self-Management Education  Visit Type:     Appt. Start Time:11001/01/2017  [name], identified by name and date of birth, is a 56 y.o. female with a diagnosis of Diabetes:  .   Had ovarian mass out in October. Now going to [facility]  3-4 day a week for swimming and using weights and silver sneakers.   ASSESS.  Wt Readings from Last 3 Encounters:  10/09/16 188 lb (85.3 kg)  09/25/16 190 lb 1.6 oz (86.2 kg)  09/24/16 184 lb (83.5 kg)   Ht Readings from Last 3 Encounters:  10/09/16 5' 1"  (1.549 m)  09/25/16 5' 2"  (0.340 m)  09/24/16 5' 2"  (1.575 m)   There is no height or weight on file to calculate BMI. @BMIFA @ Facility age limit for growth percentiles is 20 years. Facility age limit for growth percentiles is 20 years.      Diabetes Self-Management Education - 10/09/16 1031      Health Coping   How would you rate your overall health? Good     Complications   Last HgB A1C per patient/outside source 7 %   Fasting Blood glucose range (mg/dL) 130-179   Postprandial Blood glucose range (mg/dL) 130-179   Number of hypoglycemic episodes per month 0   Number of hyperglycemic episodes per week 0   Have you had a dilated eye exam in the past 12 months? Yes   Have you had a dental exam in the past 12 months? Yes   Are you checking your feet? Yes   How many days per week are you checking your feet? 7     Dietary Intake   Breakfast Boiled egg, fruit, almond milk   Lunch Salad grilled chicken, crackers, water   Dinner Corn, ham, milk almond,   Beverage(s) water, almond milk     Exercise   Exercise Type Light (walking / raking leaves);Moderate (swimming / aerobic walking)   How many days per week to you exercise? 3   How many minutes per day do you exercise? 60   Total minutes per week of exercise 180     Patient Education   Nutrition management  Food label reading, portion sizes and measuring food.;Carbohydrate counting;Reviewed blood glucose goals for pre and  post meals and how to evaluate the patients' food intake on their blood glucose level.;Meal timing in regards to the patients' current diabetes medication.;Meal options for control of blood glucose level and chronic complications.   Physical activity and exercise  Role of exercise on diabetes management, blood pressure control and cardiac health.;Identified with patient nutritional and/or medication changes necessary with exercise.;Helped patient identify appropriate exercises in relation to his/her diabetes, diabetes complications and other health issue.     Subsequent Visit   Since your last visit have you continued or begun to take your medications as prescribed? Yes   Since your last visit have you had your blood pressure checked? Yes   Is your most recent blood pressure lower, unchanged, or higher since your last visit? Lower   Since your last visit have you experienced any weight changes? Loss   Weight Loss (lbs) 2   Since your last visit, are you checking your blood glucose at least once a day? Yes      Learning Objective:  Patient will have a greater understanding of diabetes self-management. Patient education plan is to attend individual and/or group sessions per assessed needs and concerns.   Plan:   Patient Instructions  Goals 1.  Get A1C less than 7% 2. Exercise 60 minutes 4 days or more per week 3. Lose 1-2 lbs per week 4. Make sure eating 30 grams of carbs per meal and 50% of plate of low carb vegetables.     Expected Outcomes:     Education material provided: A1C conversion sheet and Carbohydrate counting sheet  If problems or questions, patient to contact team via:  Phone and Email  Future DSME appointment: -   3 months

## 2016-10-09 NOTE — Patient Instructions (Signed)

## 2016-10-13 DIAGNOSIS — M159 Polyosteoarthritis, unspecified: Secondary | ICD-10-CM | POA: Diagnosis not present

## 2016-10-13 DIAGNOSIS — E119 Type 2 diabetes mellitus without complications: Secondary | ICD-10-CM | POA: Diagnosis not present

## 2016-10-13 DIAGNOSIS — I10 Essential (primary) hypertension: Secondary | ICD-10-CM | POA: Diagnosis not present

## 2016-10-14 ENCOUNTER — Encounter: Payer: Self-pay | Admitting: Orthopedic Surgery

## 2016-10-14 ENCOUNTER — Ambulatory Visit (INDEPENDENT_AMBULATORY_CARE_PROVIDER_SITE_OTHER): Payer: Medicare Other | Admitting: Orthopedic Surgery

## 2016-10-14 DIAGNOSIS — M79672 Pain in left foot: Secondary | ICD-10-CM

## 2016-10-14 MED ORDER — TRAMADOL-ACETAMINOPHEN 37.5-325 MG PO TABS: 1.0000 | ORAL_TABLET | Freq: Four times a day (QID) | ORAL | 5 refills | Status: DC | PRN

## 2016-10-14 NOTE — Progress Notes (Signed)
Patient ID: [name], female   DOB: 1960-12-13, 56 y.o.   MRN: [id number]  Chief Complaint  Patient presents with  . Follow-up    BILATERAL SUBTALAR AREAS    HPI [name] is a 56 y.o. female.   HPI  56 year old female status post injection left subtalar joint. She walked with a Cam Walker. She did not improve. She still having pain in the subtalar region and the anterior ankle joint as well.   Review of Systems Review of Systems  Right foot and ankle have improved although still painful. Status post trauma Physical Exam  Exam shows tenderness in the subtalar joint decreased sensation in the small toe of the left foot pain over the anterior ankle joint and what looks to be a ganglion cyst in the anterolateral portion of the foot. Pulses good color is normal foot feels warm to touch ankle range of motion is normal she has pain in the subtalar joint with decreased range of motion especially with eversion which increases pain or graft she is otherwise oriented 3 mood and affect are normal   MEDICAL DECISION MAKING  DATA   Prior x-rays No fNo fracture deformity nor dislocation. The ankle mortise appears congruent and the tibiofibular syndesmosis intact. No destructive bony lesions. Mild lateral ankle soft tissue swelling without subcutaneous gas or radiopaque foreign bodies. Small plantar calcaneal spur.  IMPRESSION: Lateral ankle soft tissue swelling.  No acute osseous process.racture deformity nor dislocation. The ankle mortise appears congruent and the tibiofibular syndesmosis intact. No destructive bony lesions. Mild lateral ankle soft tissue swelling without subcutaneous gas or radiopaque foreign bodies. Small plantar calcaneal spur.  DIAGNOSIS  Subtalar pain subtalar arthritis left foot  PLAN(RISK)   Recommend Ultracet 1 every 6 when necessary pain #42 CT scan left foot evaluate subtalar joint. Patient may need subtalar fusion

## 2016-10-15 DIAGNOSIS — Z6834 Body mass index (BMI) 34.0-34.9, adult: Secondary | ICD-10-CM | POA: Diagnosis not present

## 2016-10-15 DIAGNOSIS — Z789 Other specified health status: Secondary | ICD-10-CM | POA: Diagnosis not present

## 2016-10-15 DIAGNOSIS — Z299 Encounter for prophylactic measures, unspecified: Secondary | ICD-10-CM | POA: Diagnosis not present

## 2016-10-15 DIAGNOSIS — E1142 Type 2 diabetes mellitus with diabetic polyneuropathy: Secondary | ICD-10-CM | POA: Diagnosis not present

## 2016-10-15 DIAGNOSIS — S90811A Abrasion, right foot, initial encounter: Secondary | ICD-10-CM | POA: Diagnosis not present

## 2016-10-20 ENCOUNTER — Ambulatory Visit (HOSPITAL_COMMUNITY)
Admission: RE | Admit: 2016-10-20 | Discharge: 2016-10-20 | Disposition: A | Discharge status: Home / Self Care | Payer: Medicare Other | Source: Ambulatory Visit | Attending: Orthopedic Surgery | Admitting: Orthopedic Surgery

## 2016-10-20 DIAGNOSIS — M79672 Pain in left foot: Secondary | ICD-10-CM | POA: Insufficient documentation

## 2016-10-22 ENCOUNTER — Ambulatory Visit: Payer: Medicare Other | Admitting: Orthopedic Surgery

## 2016-10-27 ENCOUNTER — Encounter (INDEPENDENT_AMBULATORY_CARE_PROVIDER_SITE_OTHER): Payer: Self-pay | Admitting: Internal Medicine

## 2016-10-28 ENCOUNTER — Encounter: Payer: Self-pay | Admitting: Orthopedic Surgery

## 2016-10-28 ENCOUNTER — Ambulatory Visit (INDEPENDENT_AMBULATORY_CARE_PROVIDER_SITE_OTHER): Payer: Medicare Other | Admitting: Orthopedic Surgery

## 2016-10-28 DIAGNOSIS — M79672 Pain in left foot: Secondary | ICD-10-CM

## 2016-10-28 NOTE — Patient Instructions (Signed)
Referral

## 2016-10-28 NOTE — Addendum Note (Signed)
Addended by: Baldomero Lamy B on: 10/28/2016 02:45 PM   Modules accepted: Orders

## 2016-10-28 NOTE — Progress Notes (Signed)
Follow-up visit  CT scan left foot  Chief Complaint  Patient presents with  . Follow-up    CT REVIEW LEFT FOOT    CT scan shows peroneal tendon tendinitis no evidence of coalition or osteoarthritis  [name] complains of dorsal lateral foot pain CT was not conclusive did not show arthritis  Review of systems numbness tingling and evident on the foot  Dorsal lateral foot pain worse Encounter Diagnosis  Name Primary?  . Left foot pain Yes     She does get pain relief from Norco 5 mg  She complains of dorsal lateral foot pain  I reviewed the CT I agree there is no subtalar arthritis  We discussed this and she would like to see foot and ankle specialist for second opinion

## 2016-11-05 ENCOUNTER — Other Ambulatory Visit: Payer: Self-pay | Admitting: "Endocrinology

## 2016-11-06 ENCOUNTER — Ambulatory Visit (INDEPENDENT_AMBULATORY_CARE_PROVIDER_SITE_OTHER): Payer: Medicare Other | Admitting: Orthopedic Surgery

## 2016-11-06 DIAGNOSIS — E1142 Type 2 diabetes mellitus with diabetic polyneuropathy: Secondary | ICD-10-CM | POA: Diagnosis not present

## 2016-11-06 DIAGNOSIS — M84375A Stress fracture, left foot, initial encounter for fracture: Secondary | ICD-10-CM | POA: Insufficient documentation

## 2016-11-06 DIAGNOSIS — M84375S Stress fracture, left foot, sequela: Secondary | ICD-10-CM | POA: Diagnosis not present

## 2016-11-06 NOTE — Progress Notes (Signed)
Office Visit Note   Patient: [name]           Date of Birth: 13-Aug-1961           MRN: [id number] Visit Date: 11/06/2016              Requested by: Carole Civil, MD [address] [place], [place] [address] PCP: Glenda Chroman, MD  No chief complaint on file.   HPI: Patient complains of left foot and ankle pain. States that she fell in August and sustained a  Greater injury to the opposite ankle but that has healed and that th left ankle is the one that gives the most pain and difficulty. She states that she is not able to sleep well at night due to the discomfort. She states that she has had x rays were normal and CT scan on 10/20/16 and this was within normal limits. She is full weight bearing and in regular shoe wear. Pt has tried a fx boot for 6-8 weeks, has tried cortisone injections x 4 and the last injection was helpful for about 2 weeks and the pain returned. Pt states that she has a history of frequent twist injuries and ankle instability and that she also has Meniere's disease and this is also what makes her prone to frequent falls.  Pamella Pert, RMA    Assessment & Plan: Visit Diagnoses:  1. Stress fracture of metatarsal bone of left foot, sequela   2. Diabetic polyneuropathy associated with type 2 diabetes mellitus ([place])     Plan: We'll have her resume wearing her fracture boot for 4 weeks. Recommended vitamin D3 5000 international units a day.  Three-view radiographs of the left foot at follow-up.  Follow-Up Instructions: Return in about 4 weeks (around 12/04/2016).   Ortho Exam On examination patient is alert oriented no adenopathy well-dressed normal affect normal respiratory effort she does have an antalgic gait she has good pulses she has dorsiflexion only to neutral with some heel cord tightness and has pain along the fifth metatarsal with dorsiflexion of her ankle. The peroneal and posterior tibial tendons are nontender to palpation ankle joint is  nontender to palpation anterior drawer is stable she does have swelling over the anterior talofibular ligament but this is nontender to palpation. She is point tender to palpation along the base of the fifth metatarsal. Metatarsal heads and interdigital web spaces are nontender to palpation no evidence of a neuroma. The Lisfranc complex is nontender with distraction. Patient is wearing soft flexible crocs shoes.  Imaging: No results found.  Orders:  No orders of the defined types were placed in this encounter.  No orders of the defined types were placed in this encounter.    Procedures: No procedures performed  Clinical Data: No additional findings.  Subjective: Review of Systems  Objective: Vital Signs: There were no vitals taken for this visit.  Specialty Comments:  No specialty comments available.  PMFS History: Patient Active Problem List   Diagnosis Date Noted  . Metatarsal stress fracture of left foot 11/06/2016  . Diabetic polyneuropathy associated with type 2 diabetes mellitus ([place]) 11/06/2016  . Hypercholesteremia 07/15/2016  . Morbid obesity due to excess calories ([place]) 04/29/2016  . Nausea and vomiting 01/17/2014  . HTN (hypertension) 10/25/2013  . Fatty liver disease, nonalcoholic 35/70/1779  . IBS (irritable bowel syndrome) 02/24/2012  . Type 2 diabetes mellitus, uncontrolled ([place]) 02/24/2012   Past Medical History:  Diagnosis Date  . Anxiety disorder   .  Chronic low back pain   . Cirrhosis of liver ([place])   . Colitis   . Diabetes mellitus   . Diabetic neuropathy ([place])   . Diverticulitis   . Fatty liver   . GERD (gastroesophageal reflux disease)   . High cholesterol   . Hypertension   . Irritable bowel syndrome   . Meniere's disease   . Neuropathy ([place])   . Pain     Family History  Problem Relation Age of Onset  . Healthy Mother   . Heart disease Father   . Esophageal cancer Brother   . Healthy Son   . Healthy Son     Past Surgical History:   Procedure Laterality Date  . ABDOMINAL HYSTERECTOMY    . bladder tact  2005  . CHOLECYSTECTOMY  08/11  . COLONOSCOPY  2208  . COLONOSCOPY  In of September 2014   @ [facility]/Dr,.[name]  . [place]  . HERNIA REPAIR  09/17/11  . [place]  2009   Preformed on the right shoulder  . OVARIAN CYST REMOVAL     right side  . PARTIAL HYSTERECTOMY  2005  . UPPER GASTROINTESTINAL ENDOSCOPY  2008  . URETERAL STENT PLACEMENT     Social History   Occupational History  . Not on file.   Social History Main Topics  . Smoking status: Never Smoker  . Smokeless tobacco: Never Used  . Alcohol use No  . Drug use: No  . Sexual activity: Yes    Birth control/ protection: Surgical

## 2016-11-16 ENCOUNTER — Encounter: Payer: Self-pay | Admitting: "Endocrinology

## 2016-11-17 ENCOUNTER — Telehealth: Payer: Self-pay

## 2016-11-17 NOTE — Telephone Encounter (Signed)
Pt.notified

## 2016-11-17 NOTE — Telephone Encounter (Signed)
For her , we are working towards non insulin therapy. She may not need it, if she can't control her diabetes without insulin. We will discuss it during her next visit.

## 2016-11-17 NOTE — Telephone Encounter (Signed)
Pt sent this email    Dr. Dorris Fetch! Been reading up on the [facility], monitoring blood glucose meter. No sticking is required. I would like to try this. Need a prescription for this. Look forward to hearing from you. My next appt. is not until April, 2018. Thanks! [name]. [place]

## 2016-11-18 ENCOUNTER — Encounter (HOSPITAL_COMMUNITY): Payer: Self-pay | Admitting: *Deleted

## 2016-11-18 ENCOUNTER — Emergency Department (HOSPITAL_COMMUNITY): Payer: Medicare Other

## 2016-11-18 ENCOUNTER — Emergency Department (HOSPITAL_COMMUNITY)
Admission: EM | Admit: 2016-11-18 | Discharge: 2016-11-18 | Disposition: A | Discharge status: Home / Self Care | Payer: Medicare Other | Attending: Emergency Medicine | Admitting: Emergency Medicine

## 2016-11-18 DIAGNOSIS — R51 Headache: Secondary | ICD-10-CM | POA: Insufficient documentation

## 2016-11-18 DIAGNOSIS — E119 Type 2 diabetes mellitus without complications: Secondary | ICD-10-CM | POA: Diagnosis not present

## 2016-11-18 DIAGNOSIS — R079 Chest pain, unspecified: Secondary | ICD-10-CM

## 2016-11-18 DIAGNOSIS — Z7984 Long term (current) use of oral hypoglycemic drugs: Secondary | ICD-10-CM | POA: Insufficient documentation

## 2016-11-18 DIAGNOSIS — R0789 Other chest pain: Secondary | ICD-10-CM | POA: Diagnosis not present

## 2016-11-18 DIAGNOSIS — R002 Palpitations: Secondary | ICD-10-CM | POA: Insufficient documentation

## 2016-11-18 DIAGNOSIS — Z79899 Other long term (current) drug therapy: Secondary | ICD-10-CM | POA: Insufficient documentation

## 2016-11-18 DIAGNOSIS — R519 Headache, unspecified: Secondary | ICD-10-CM

## 2016-11-18 DIAGNOSIS — R110 Nausea: Secondary | ICD-10-CM | POA: Insufficient documentation

## 2016-11-18 DIAGNOSIS — I10 Essential (primary) hypertension: Secondary | ICD-10-CM | POA: Insufficient documentation

## 2016-11-18 LAB — BASIC METABOLIC PANEL
Anion gap: 7 (ref 5–15)
BUN: 16 mg/dL (ref 6–20)
CALCIUM: 9 mg/dL (ref 8.9–10.3)
CO2: 22 mmol/L (ref 22–32)
CREATININE: 0.78 mg/dL (ref 0.44–1.00)
Chloride: 107 mmol/L (ref 101–111)
Glucose, Bld: 316 mg/dL — ABNORMAL HIGH (ref 65–99)
Potassium: 3.3 mmol/L — ABNORMAL LOW (ref 3.5–5.1)
SODIUM: 136 mmol/L (ref 135–145)

## 2016-11-18 LAB — CBC
HCT: 37.1 % (ref 36.0–46.0)
Hemoglobin: 12.4 g/dL (ref 12.0–15.0)
MCH: 29.5 pg (ref 26.0–34.0)
MCHC: 33.4 g/dL (ref 30.0–36.0)
MCV: 88.1 fL (ref 78.0–100.0)
PLATELETS: 156 10*3/uL (ref 150–400)
RBC: 4.21 MIL/uL (ref 3.87–5.11)
RDW: 15.3 % (ref 11.5–15.5)
WBC: 13.4 10*3/uL — AB (ref 4.0–10.5)

## 2016-11-18 LAB — I-STAT TROPONIN, ED: TROPONIN I, POC: 0 ng/mL (ref 0.00–0.08)

## 2016-11-18 LAB — TROPONIN I: Troponin I: <0.03 ng/mL (ref <0.03)

## 2016-11-18 MED ORDER — PROCHLORPERAZINE EDISYLATE 5 MG/ML IJ SOLN
10.0000 mg | Freq: Once | INTRAMUSCULAR | Status: AC
  Administered 2016-11-18: 10 mg via INTRAVENOUS
  Filled 2016-11-18: qty 2

## 2016-11-18 MED ORDER — DIPHENHYDRAMINE HCL 50 MG/ML IJ SOLN
50.0000 mg | Freq: Once | INTRAMUSCULAR | Status: AC
  Administered 2016-11-18: 50 mg via INTRAVENOUS
  Filled 2016-11-18: qty 1

## 2016-11-18 NOTE — ED Provider Notes (Signed)
[facility] DEPT Provider Note   CSN: [id number] Arrival date & time: 11/18/16  0302     History   Chief Complaint Chief Complaint  Patient presents with  . Chest Pain    HPI [name] is a 56 y.o. female.  HPI  This is a 56 year old female who presents with headache and chest pain. Patient reports onset of symptoms at approximately midnight. She woke up with a left-sided headache. Fairly acute in onset. Currently the headache is 8 out of 10. She took a BC powder at home with some improvement of her symptoms.  At that time she states that she was experiencing palpitations and chest pressure. That has since resolved. She does have a history of diabetes, hypertension, high cholesterol. She denies any vision changes, weakness, numbness, tingling. She is concerned and has anxiety over her headache because she has a strong family history of brain aneurysm. She denies worst headache of her life.  Past Medical History:  Diagnosis Date  . Anxiety disorder   . Chronic low back pain   . Cirrhosis of liver ([place])   . Colitis   . Diabetes mellitus   . Diabetic neuropathy ([place])   . Diverticulitis   . Fatty liver   . GERD (gastroesophageal reflux disease)   . High cholesterol   . Hypertension   . Irritable bowel syndrome   . Meniere's disease   . Neuropathy ([place])   . Pain     Patient Active Problem List   Diagnosis Date Noted  . Metatarsal stress fracture of left foot 11/06/2016  . Diabetic polyneuropathy associated with type 2 diabetes mellitus ([place]) 11/06/2016  . Hypercholesteremia 07/15/2016  . Morbid obesity due to excess calories ([place]) 04/29/2016  . Nausea and vomiting 01/17/2014  . HTN (hypertension) 10/25/2013  . Fatty liver disease, nonalcoholic 99/37/1696  . IBS (irritable bowel syndrome) 02/24/2012  . Type 2 diabetes mellitus, uncontrolled ([place]) 02/24/2012    Past Surgical History:  Procedure Laterality Date  . ABDOMINAL HYSTERECTOMY    . bladder tact  2005   . CHOLECYSTECTOMY  08/11  . COLONOSCOPY  2208  . COLONOSCOPY  In of September 2014   @ [facility]/Dr,.[name]  . [place]  . HERNIA REPAIR  09/17/11  . [place]  2009   Preformed on the right shoulder  . OVARIAN CYST REMOVAL     right side  . PARTIAL HYSTERECTOMY  2005  . UPPER GASTROINTESTINAL ENDOSCOPY  2008  . URETERAL STENT PLACEMENT      OB History    No data available       Home Medications    Prior to Admission medications   Medication Sig Start Date End Date Taking? Authorizing Provider  acetaminophen (TYLENOL) 500 MG tablet Take 500 mg by mouth every 6 (six) hours as needed for mild pain.    Historical Provider, MD  acetaZOLAMIDE (DIAMOX) 250 MG tablet Take 250 mg by mouth daily.  04/28/16   Historical Provider, MD  acyclovir (ZOVIRAX) 400 MG tablet Take 1 tablet by mouth 3 (three) times daily as needed (fever blisters).  06/25/16   Historical Provider, MD  atorvastatin (LIPITOR) 20 MG tablet Take 1 tablet (20 mg total) by mouth daily. 07/15/16   Cassandria Anger, MD  ciprofloxacin (CIPRO) 500 MG tablet Take 1 tablet (500 mg total) by mouth 2 (two) times daily. 09/25/16   Butch Penny, NP  cyanocobalamin (,VITAMIN B-12,) 1000 MCG/ML injection Inject 1,000 mcg into the muscle every  30 (thirty) days. 04/14/16   Historical Provider, MD  diazepam (VALIUM) 5 MG tablet Take 2.5 mg by mouth every 12 (twelve) hours as needed for anxiety.  04/07/16   Historical Provider, MD  DULoxetine (CYMBALTA) 60 MG capsule Take 60 mg by mouth daily. 04/28/16   Historical Provider, MD  ferrous sulfate 325 (65 FE) MG tablet Take 1 tablet (325 mg total) by mouth daily. 06/26/16   Tammy Triplett, PA-C  fluticasone (FLONASE) 50 MCG/ACT nasal spray Place 2 sprays into both nostrils every morning.     Historical Provider, MD  glucose blood test strip Use as instructed 4 times daily. E11.65. TrueTrack test strips 07/01/16   Cassandria Anger, MD  HYDROcodone-acetaminophen  (NORCO/VICODIN) 5-325 MG tablet Take 1 tablet by mouth every 6 (six) hours as needed. Patient taking differently: Take 1 tablet by mouth every 6 (six) hours as needed for moderate pain.  06/11/16   Carole Civil, MD  ibuprofen (ADVIL,MOTRIN) 600 MG tablet Take 1 tablet (600 mg total) by mouth every 6 (six) hours as needed. Patient taking differently: Take 600 mg by mouth every 6 (six) hours as needed for moderate pain.  05/15/16   Lily Kocher, PA-C  metFORMIN (GLUCOPHAGE) 1000 MG tablet TAKE ONE TABLET BY MOUTH TWICE DAILY WITH A MEAL. 09/15/16   Cassandria Anger, MD  metoprolol (LOPRESSOR) 50 MG tablet Take 25 mg by mouth daily. Patient takes 1/2 tablet daily    Historical Provider, MD  ondansetron (ZOFRAN) 4 MG tablet Take 1 tablet (4 mg total) by mouth every 8 (eight) hours as needed for nausea or vomiting. 09/24/16   Rolland Porter, MD  pantoprazole (PROTONIX) 20 MG tablet Take 1 po BID x 2 weeks then once a day 09/24/16   Rolland Porter, MD  traMADol (ULTRAM) 50 MG tablet Take 50 mg by mouth 3 (three) times daily as needed for moderate pain or severe pain.  02/18/16   Historical Provider, MD  traMADol-acetaminophen (ULTRACET) 37.5-325 MG tablet Take 1 tablet by mouth every 6 (six) hours as needed. 10/14/16   Carole Civil, MD  TRESIBA FLEXTOUCH 200 UNIT/ML SOPN INJECT 50 UNITS SUBCUTANEOUSLY AT BEDTIME. 04/28/16   Historical Provider, MD    Family History Family History  Problem Relation Age of Onset  . Healthy Mother   . Heart disease Father   . Esophageal cancer Brother   . Healthy Son   . Healthy Son     Social History Social History  Substance Use Topics  . Smoking status: Never Smoker  . Smokeless tobacco: Never Used  . Alcohol use No     Allergies   Flagyl [metronidazole hcl] and Nsaids   Review of Systems Review of Systems  Constitutional: Negative for fever.  Eyes: Negative for visual disturbance.  Respiratory: Negative for shortness of breath.   Cardiovascular:  Positive for chest pain and palpitations.  Gastrointestinal: Positive for nausea. Negative for abdominal pain and vomiting.  Neurological: Positive for headaches. Negative for weakness and numbness.  All other systems reviewed and are negative.    Physical Exam Updated Vital Signs BP 138/76   Pulse 96   Temp 98 F (36.7 C) (Oral)   Resp 20   Ht 5' 2"  (1.575 m)   Wt 183 lb (83 kg)   SpO2 96%   BMI 33.47 kg/m   Physical Exam  Constitutional: She is oriented to person, place, and time. She appears well-developed and well-nourished. No distress.  HENT:  Head: Normocephalic and atraumatic.  Eyes: EOM are normal. Pupils are equal, round, and reactive to light.  Neck: Normal range of motion.  Cardiovascular: Normal rate, regular rhythm and normal heart sounds.   No murmur heard. Pulmonary/Chest: Effort normal and breath sounds normal. No respiratory distress. She has no wheezes.  Abdominal: Soft. Bowel sounds are normal. There is no tenderness. There is no guarding.  Neurological: She is alert and oriented to person, place, and time.  Fluent speech, cranial nerves II through XII intact, no dysmetria to finger-nose-finger, 5 out of 5 strength in all 4 extremities  Skin: Skin is warm and dry.  Psychiatric: She has a normal mood and affect.  Nursing note and vitals reviewed.    ED Treatments / Results  Labs (all labs ordered are listed, but only abnormal results are displayed) Labs Reviewed  BASIC METABOLIC PANEL - Abnormal; Notable for the following:       Result Value   Potassium 3.3 (*)    Glucose, Bld 316 (*)    All other components within normal limits  CBC - Abnormal; Notable for the following:    WBC 13.4 (*)    All other components within normal limits  TROPONIN I  I-STAT TROPOININ, ED    EKG  EKG Interpretation  Date/Time:  Tuesday November 18 2016 03:17:06 EST Ventricular Rate:  97 PR Interval:    QRS Duration: 95 QT Interval:  361 QTC Calculation: 459 R  Axis:   -8 Text Interpretation:  Sinus rhythm Confirmed by Dina Rich  MD, Loma Sousa ([id number]) on 11/18/2016 3:22:32 AM       Radiology Dg Chest 2 View  Result Date: 11/18/2016 CLINICAL DATA:  LEFT headache for a few days, LEFT chest pressure tonight. EXAM: CHEST  2 VIEW COMPARISON:  Chest radiograph May 29, 2015 FINDINGS: Cardiomediastinal silhouette is normal. No pleural effusions or focal consolidations. Trachea projects midline and there is no pneumothorax. Persistently elevated RIGHT hemidiaphragm. Postsurgical changes distal RIGHT clavicle. Surgical clips in the included right abdomen compatible with cholecystectomy. Mild degenerative change of the spine . IMPRESSION: Stable examination: No acute cardiopulmonary process. Electronically Signed   By: Elon Alas M.D.   On: 11/18/2016 04:58   Ct Head Wo Contrast  Result Date: 11/18/2016 CLINICAL DATA:  LEFT headache for few days. History of diabetes, cirrhosis, hypertension and hyperlipidemia. EXAM: CT HEAD WITHOUT CONTRAST TECHNIQUE: Contiguous axial images were obtained from the base of the skull through the vertex without intravenous contrast. COMPARISON:  CT HEAD June 15, 2015 FINDINGS: BRAIN: The ventricles and sulci are normal. No intraparenchymal hemorrhage, mass effect nor midline shift. No acute large vascular territory infarcts. No abnormal extra-axial fluid collections. Basal cisterns are patent. VASCULAR: Mild calcific atherosclerosis of the carotid siphons. SKULL/SOFT TISSUES: No skull fracture. No significant soft tissue swelling. ORBITS/SINUSES: The included ocular globes and orbital contents are normal.The mastoid aircells and included paranasal sinuses are well-aerated. OTHER: None. IMPRESSION: Mild atherosclerosis, otherwise negative CT HEAD. Electronically Signed   By: Elon Alas M.D.   On: 11/18/2016 05:01    Procedures Procedures (including critical care time)  Medications Ordered in ED Medications    prochlorperazine (COMPAZINE) injection 10 mg (10 mg Intravenous Given 11/18/16 0542)  diphenhydrAMINE (BENADRYL) injection 50 mg (50 mg Intravenous Given 11/18/16 0542)     Initial Impression / Assessment and Plan / ED Course  I have reviewed the triage vital signs and the nursing notes.  Pertinent labs & imaging results that were available during my care of the patient were reviewed  by me and considered in my medical decision making (see chart for details).     Patient presents today with headache and chest pain. Describes palpitations. She states that she has anxiety related to this headache given that she has a family history of aneurysm. She denies worst headache of her life. She does have a history of migraines which were somewhat similar. She is nonfocal. Her vital signs are reassuring. EKG is nonischemic. Troponin is negative. CT scan obtained within 6 hours of onset of symptoms. CT is negative for acute bleed. This is fairly reassuring and sensitive. Patient was given a migraine cocktail. On recheck, she states that she feels much better. I discussed the results with the patient.  CT is very reassuring; however, not 100% sensitive. I have offered LP. We discussed the risk and benefits. She does not wish to proceed. Feel this is reasonable. Regarding her chest pain, she does have risk factors per EKG is normal and her troponin is negative. We'll have her follow-up closely with cardiology.  After history, exam, and medical workup I feel the patient has been appropriately medically screened and is safe for discharge home. Pertinent diagnoses were discussed with the patient. Patient was given return precautions.   Final Clinical Impressions(s) / ED Diagnoses   Final diagnoses:  Chest pain, unspecified type  Acute nonintractable headache, unspecified headache type    New Prescriptions New Prescriptions   No medications on file     Merryl Hacker, MD 11/18/16 [phone]

## 2016-11-18 NOTE — ED Triage Notes (Signed)
Pt c/o left side headache for the last couple of days and chest pressure that started tonight x 3 hours ago;

## 2016-11-18 NOTE — Discharge Instructions (Signed)
You were seen today for headache and chest pain. Your workup is reassuring. Her CT scan does not show any evidence of bleeding aneurysm. While this is not 100%, it is very reassuring. Your EKG and chest pain workup is also reassuring. However, you need to follow-up with cardiology for stress testing given your risk factors.

## 2016-11-20 DIAGNOSIS — Z299 Encounter for prophylactic measures, unspecified: Secondary | ICD-10-CM | POA: Diagnosis not present

## 2016-11-20 DIAGNOSIS — I10 Essential (primary) hypertension: Secondary | ICD-10-CM | POA: Diagnosis not present

## 2016-11-20 DIAGNOSIS — N390 Urinary tract infection, site not specified: Secondary | ICD-10-CM | POA: Diagnosis not present

## 2016-11-20 DIAGNOSIS — E1142 Type 2 diabetes mellitus with diabetic polyneuropathy: Secondary | ICD-10-CM | POA: Diagnosis not present

## 2016-11-20 DIAGNOSIS — Z6833 Body mass index (BMI) 33.0-33.9, adult: Secondary | ICD-10-CM | POA: Diagnosis not present

## 2016-11-20 DIAGNOSIS — R300 Dysuria: Secondary | ICD-10-CM | POA: Diagnosis not present

## 2016-12-04 ENCOUNTER — Other Ambulatory Visit: Payer: Self-pay | Admitting: "Endocrinology

## 2016-12-05 ENCOUNTER — Ambulatory Visit (INDEPENDENT_AMBULATORY_CARE_PROVIDER_SITE_OTHER): Payer: Medicare Other | Admitting: Orthopedic Surgery

## 2016-12-31 ENCOUNTER — Other Ambulatory Visit: Payer: Self-pay | Admitting: "Endocrinology

## 2016-12-31 DIAGNOSIS — E1165 Type 2 diabetes mellitus with hyperglycemia: Secondary | ICD-10-CM | POA: Diagnosis not present

## 2016-12-31 DIAGNOSIS — E118 Type 2 diabetes mellitus with unspecified complications: Secondary | ICD-10-CM | POA: Diagnosis not present

## 2016-12-31 DIAGNOSIS — Z794 Long term (current) use of insulin: Secondary | ICD-10-CM | POA: Diagnosis not present

## 2017-01-01 LAB — COMPREHENSIVE METABOLIC PANEL
ALK PHOS: 100 U/L (ref 33–130)
ALT: 26 U/L (ref 6–29)
AST: 43 U/L — AB (ref 10–35)
Albumin: 3.7 g/dL (ref 3.6–5.1)
BILIRUBIN TOTAL: 0.7 mg/dL (ref 0.2–1.2)
BUN: 12 mg/dL (ref 7–25)
CALCIUM: 9 mg/dL (ref 8.6–10.4)
CO2: 23 mmol/L (ref 20–31)
Chloride: 107 mmol/L (ref 98–110)
Creat: 0.76 mg/dL (ref 0.50–1.05)
GLUCOSE: 135 mg/dL — AB (ref 65–99)
Potassium: 4 mmol/L (ref 3.5–5.3)
Sodium: 142 mmol/L (ref 135–146)
Total Protein: 7.9 g/dL (ref 6.1–8.1)

## 2017-01-01 LAB — HEMOGLOBIN A1C
Hgb A1c MFr Bld: 8.5 % — ABNORMAL HIGH (ref <5.7)
Mean Plasma Glucose: 197 mg/dL

## 2017-01-01 LAB — MICROALBUMIN / CREATININE URINE RATIO
Creatinine, Urine: 196 mg/dL (ref 20–320)
MICROALB UR: 1.3 mg/dL
Microalb Creat Ratio: 7 mcg/mg creat (ref <30)

## 2017-01-05 ENCOUNTER — Encounter (INDEPENDENT_AMBULATORY_CARE_PROVIDER_SITE_OTHER): Payer: Self-pay | Admitting: Internal Medicine

## 2017-01-06 DIAGNOSIS — Z6835 Body mass index (BMI) 35.0-35.9, adult: Secondary | ICD-10-CM | POA: Diagnosis not present

## 2017-01-06 DIAGNOSIS — N898 Other specified noninflammatory disorders of vagina: Secondary | ICD-10-CM | POA: Diagnosis not present

## 2017-01-06 DIAGNOSIS — N760 Acute vaginitis: Secondary | ICD-10-CM | POA: Diagnosis not present

## 2017-01-09 ENCOUNTER — Ambulatory Visit: Payer: Medicare Other | Admitting: "Endocrinology

## 2017-01-10 DIAGNOSIS — K7460 Unspecified cirrhosis of liver: Secondary | ICD-10-CM | POA: Diagnosis not present

## 2017-01-10 DIAGNOSIS — M1990 Unspecified osteoarthritis, unspecified site: Secondary | ICD-10-CM | POA: Diagnosis not present

## 2017-01-10 DIAGNOSIS — K529 Noninfective gastroenteritis and colitis, unspecified: Secondary | ICD-10-CM | POA: Diagnosis not present

## 2017-01-10 DIAGNOSIS — R1033 Periumbilical pain: Secondary | ICD-10-CM | POA: Diagnosis not present

## 2017-01-10 DIAGNOSIS — R112 Nausea with vomiting, unspecified: Secondary | ICD-10-CM | POA: Diagnosis not present

## 2017-01-10 DIAGNOSIS — Z79899 Other long term (current) drug therapy: Secondary | ICD-10-CM | POA: Diagnosis not present

## 2017-01-10 DIAGNOSIS — E119 Type 2 diabetes mellitus without complications: Secondary | ICD-10-CM | POA: Diagnosis not present

## 2017-01-10 DIAGNOSIS — K5730 Diverticulosis of large intestine without perforation or abscess without bleeding: Secondary | ICD-10-CM | POA: Diagnosis not present

## 2017-01-10 DIAGNOSIS — I10 Essential (primary) hypertension: Secondary | ICD-10-CM | POA: Diagnosis not present

## 2017-01-10 DIAGNOSIS — K219 Gastro-esophageal reflux disease without esophagitis: Secondary | ICD-10-CM | POA: Diagnosis not present

## 2017-01-10 DIAGNOSIS — Z794 Long term (current) use of insulin: Secondary | ICD-10-CM | POA: Diagnosis not present

## 2017-01-15 ENCOUNTER — Telehealth: Payer: Self-pay | Admitting: Orthopedic Surgery

## 2017-01-15 NOTE — Telephone Encounter (Signed)
[name] called in today wanting an appointment with Dr. Aline Brochure for her feet.  I told her that when Dr. Aline Brochure saw her last on 10-28-16 he referred her to Dr. Sharol Given.  She saw Dr. Sharol Given on 11-06-16 and then was a no show for him on 12-05-16.  She said she was not really happy about seeing Dr. Sharol Given and that he did not take any x-rays that day.  I told her Dr. Ruthe Mannan schedule was pretty busy for the next few weeks and that I would have to speak to Dr. Aline Brochure about him seeing her again.  She said that she has been to [facility] before for her knees and that maybe she could go there for her foot problems.  I told her that would be up to her but I did ask her to think about seeing Dr. Sharol Given again.  She said she would think about it and let me know.

## 2017-01-16 ENCOUNTER — Other Ambulatory Visit (HOSPITAL_COMMUNITY): Payer: Self-pay | Admitting: Nurse Practitioner

## 2017-01-16 ENCOUNTER — Ambulatory Visit (HOSPITAL_COMMUNITY)
Admission: RE | Admit: 2017-01-16 | Discharge: 2017-01-16 | Disposition: A | Discharge status: Home / Self Care | Payer: Medicare Other | Source: Ambulatory Visit | Attending: Nurse Practitioner | Admitting: Nurse Practitioner

## 2017-01-16 ENCOUNTER — Telehealth (INDEPENDENT_AMBULATORY_CARE_PROVIDER_SITE_OTHER): Payer: Self-pay | Admitting: Orthopedic Surgery

## 2017-01-16 DIAGNOSIS — R109 Unspecified abdominal pain: Secondary | ICD-10-CM | POA: Diagnosis not present

## 2017-01-16 DIAGNOSIS — I10 Essential (primary) hypertension: Secondary | ICD-10-CM | POA: Diagnosis not present

## 2017-01-16 DIAGNOSIS — E1143 Type 2 diabetes mellitus with diabetic autonomic (poly)neuropathy: Secondary | ICD-10-CM | POA: Diagnosis not present

## 2017-01-16 DIAGNOSIS — Z6834 Body mass index (BMI) 34.0-34.9, adult: Secondary | ICD-10-CM | POA: Diagnosis not present

## 2017-01-16 DIAGNOSIS — K2960 Other gastritis without bleeding: Secondary | ICD-10-CM | POA: Diagnosis not present

## 2017-01-16 DIAGNOSIS — Z299 Encounter for prophylactic measures, unspecified: Secondary | ICD-10-CM | POA: Diagnosis not present

## 2017-01-16 NOTE — Telephone Encounter (Signed)
Returned call to patient left message to return call to schedule appointment with Dr Sharol Given  [phone]

## 2017-01-21 ENCOUNTER — Ambulatory Visit: Payer: Medicare Other | Admitting: Nutrition

## 2017-01-22 ENCOUNTER — Ambulatory Visit (INDEPENDENT_AMBULATORY_CARE_PROVIDER_SITE_OTHER): Payer: Self-pay

## 2017-01-22 ENCOUNTER — Ambulatory Visit (INDEPENDENT_AMBULATORY_CARE_PROVIDER_SITE_OTHER): Payer: Medicare Other | Admitting: Orthopedic Surgery

## 2017-01-22 ENCOUNTER — Ambulatory Visit (INDEPENDENT_AMBULATORY_CARE_PROVIDER_SITE_OTHER): Payer: Medicare Other

## 2017-01-22 ENCOUNTER — Encounter (INDEPENDENT_AMBULATORY_CARE_PROVIDER_SITE_OTHER): Payer: Self-pay | Admitting: Orthopedic Surgery

## 2017-01-22 VITALS — Ht 62.0 in | Wt 183.0 lb

## 2017-01-22 DIAGNOSIS — M79671 Pain in right foot: Secondary | ICD-10-CM | POA: Insufficient documentation

## 2017-01-22 DIAGNOSIS — M79672 Pain in left foot: Secondary | ICD-10-CM

## 2017-01-22 NOTE — Progress Notes (Signed)
Office Visit Note   Patient: [name]           Date of Birth: 1960-12-27           MRN: [id number] Visit Date: 01/22/2017              Requested by: Glenda Chroman, MD [address] [place], [place] [address] PCP: Glenda Chroman, MD  Chief Complaint  Patient presents with  . Left Foot - Follow-up    Stress fracture follow up  . Right Foot - Pain      HPI: Patient states she still has bilateral foot pain she's noticed some new bruising on the dorsum of the right foot. She states the pain is primarily around the base of the fourth and fifth metatarsals bilaterally. Patient states she is taking vitamin D3 2000 international units a day she complains of pain and swelling is worse with walking no pain at rest.  Assessment & Plan: Visit Diagnoses:  1. Pain in left foot   2. Pain in right foot     Plan: Recommended a stiffer soled walking shoe her shoes extremely flexible recommended orthotics to unload the metatarsal heads stiff soled shoe to relieve pressure across the midfoot follow-up in 4 weeks.  Follow-Up Instructions: Return in about 4 weeks (around 02/19/2017).   Ortho Exam  Patient is alert, oriented, no adenopathy, well-dressed, normal affect, normal respiratory effort. Examination patient has a normal gait. She has good subtalar and ankle motion. She has good dorsalis pedis pulses bilaterally. She does not have pain to palpation within the web spaces bilaterally or across the metatarsal heads she is primarily tender to palpation across the base of the fourth and fifth metatarsals. Lisfranc joint is nontender with distraction. Radiographs shows no bony abnormalities.  Imaging: Xr Foot Complete Left  Result Date: 01/22/2017 Three-view radiographs of the left foot shows normal alignment at metatarsal heads there is no increasing callus formation around the metatarsals the midfoot is congruent no evidence of any lytic changes no cortical thickening.  Xr Foot Complete  Right  Result Date: 01/22/2017 Three-view radiographs of the right foot shows normal alignment of the metatarsal heads there is no evidence of any stress fractures in the metatarsals. There is no increased callus formation no lytic changes no thickening of the cortex. The midfoot as well aligned.   Labs: Lab Results  Component Value Date   HGBA1C 8.5 (H) 12/31/2016   HGBA1C 7.0 (H) 10/01/2016   HGBA1C 7.5 (H) 06/30/2016    Orders:  Orders Placed This Encounter  Procedures  . XR Foot Complete Left  . XR Foot Complete Right   No orders of the defined types were placed in this encounter.    Procedures: No procedures performed  Clinical Data: No additional findings.  ROS:  All other systems negative, except as noted in the HPI. Review of Systems  Objective: Vital Signs: Ht 5' 2"  (1.575 m)   Wt 183 lb (83 kg)   BMI 33.47 kg/m   Specialty Comments:  No specialty comments available.  PMFS History: Patient Active Problem List   Diagnosis Date Noted  . Pain in left foot 01/22/2017  . Pain in right foot 01/22/2017  . Metatarsal stress fracture of left foot 11/06/2016  . Diabetic polyneuropathy associated with type 2 diabetes mellitus ([place]) 11/06/2016  . Hypercholesteremia 07/15/2016  . Morbid obesity due to excess calories ([place]) 04/29/2016  . Nausea and vomiting 01/17/2014  . HTN (hypertension) 10/25/2013  .  Fatty liver disease, nonalcoholic 97/58/8325  . IBS (irritable bowel syndrome) 02/24/2012  . Type 2 diabetes mellitus, uncontrolled ([place]) 02/24/2012   Past Medical History:  Diagnosis Date  . Anxiety disorder   . Chronic low back pain   . Cirrhosis of liver ([place])   . Colitis   . Diabetes mellitus   . Diabetic neuropathy ([place])   . Diverticulitis   . Fatty liver   . GERD (gastroesophageal reflux disease)   . High cholesterol   . Hypertension   . Irritable bowel syndrome   . Meniere's disease   . Neuropathy   . Pain     Family History  Problem  Relation Age of Onset  . Healthy Mother   . Heart disease Father   . Esophageal cancer Brother   . Healthy Son   . Healthy Son     Past Surgical History:  Procedure Laterality Date  . ABDOMINAL HYSTERECTOMY    . bladder tact  2005  . CHOLECYSTECTOMY  08/11  . COLONOSCOPY  2208  . COLONOSCOPY  In of September 2014   @ [facility]/Dr,.[name]  . [place]  . HERNIA REPAIR  09/17/11  . [place]  2009   Preformed on the right shoulder  . OVARIAN CYST REMOVAL     right side  . PARTIAL HYSTERECTOMY  2005  . UPPER GASTROINTESTINAL ENDOSCOPY  2008  . URETERAL STENT PLACEMENT     Social History   Occupational History  . Not on file.   Social History Main Topics  . Smoking status: Never Smoker  . Smokeless tobacco: Never Used  . Alcohol use No  . Drug use: No  . Sexual activity: Yes    Birth control/ protection: Surgical

## 2017-02-09 ENCOUNTER — Ambulatory Visit (INDEPENDENT_AMBULATORY_CARE_PROVIDER_SITE_OTHER): Payer: Medicare Other | Admitting: Internal Medicine

## 2017-02-10 ENCOUNTER — Encounter (INDEPENDENT_AMBULATORY_CARE_PROVIDER_SITE_OTHER): Payer: Self-pay | Admitting: Internal Medicine

## 2017-02-10 ENCOUNTER — Ambulatory Visit (INDEPENDENT_AMBULATORY_CARE_PROVIDER_SITE_OTHER): Payer: Medicare Other | Admitting: Internal Medicine

## 2017-02-10 VITALS — BP 130/90 | HR 78 | Temp 97.3°F | Resp 18 | Ht 62.0 in | Wt 188.6 lb

## 2017-02-10 DIAGNOSIS — K760 Fatty (change of) liver, not elsewhere classified: Secondary | ICD-10-CM | POA: Diagnosis not present

## 2017-02-10 DIAGNOSIS — K589 Irritable bowel syndrome without diarrhea: Secondary | ICD-10-CM | POA: Diagnosis not present

## 2017-02-10 DIAGNOSIS — K219 Gastro-esophageal reflux disease without esophagitis: Secondary | ICD-10-CM

## 2017-02-10 DIAGNOSIS — K921 Melena: Secondary | ICD-10-CM | POA: Diagnosis not present

## 2017-02-10 DIAGNOSIS — R110 Nausea: Secondary | ICD-10-CM | POA: Diagnosis not present

## 2017-02-10 MED ORDER — DICYCLOMINE HCL 10 MG PO CAPS: 10.0000 mg | ORAL_CAPSULE | Freq: Three times a day (TID) | ORAL | 2 refills | Status: DC | PRN

## 2017-02-10 NOTE — Progress Notes (Signed)
Presenting complaint;  Patient complains of rectal bleeding and nausea. Follow-up for IBS GERD and cirrhosis.  Subjective:  [name] is 56 year old Caucasian female with multiple medical problems who is here for scheduled visit. She was last seen in December for gastroenteritis and treated with Cipro. She has multiple complaints. She is passing blood with her bowel movements intermittently for the last few months. She describes amount to be small and esophagus fresh. She says she is having less diarrhea since she has been on by mouth iron. She is having 2-4 bowel movements per day and most of her stools are formed. She states she is been on metformin for over a year and she does not believe that it causes have more diarrhea. She complains of severe cramping every time she has a bowel movement. She's had this discomfort since she had gastroenteritis few months ago. She also complains of urgency. Sometimes she has a BM on drinking few sips of water. She says her appetite is good. Her weight has been up and down. She says heartburn medication is working. She takes ibuprofen no more than 3-4 times a day month. She has nausea on most mornings usually after breakfast but no vomiting. She takes Zofran once or twice every week. She also complains of fatigue but she is able to take care for household work and [facility]. She had EGD with duodenal biopsy October 2008 and biopsy was negative for celiac disease She had colonoscopy in May 2008 with removal of small tubular adenoma. She had colonoscopy in September 2013 with removal of small polyp with focal adenomatous change. She also had mild sigmoid colon diverticulosis.    Current Medications: Outpatient Encounter Prescriptions as of 02/10/2017  Medication Sig  . acetaZOLAMIDE (DIAMOX) 250 MG tablet Take 250 mg by mouth daily.   Marland Kitchen acyclovir (ZOVIRAX) 400 MG tablet Take 1 tablet by mouth 3 (three) times daily as needed (fever blisters).   Marland Kitchen  atorvastatin (LIPITOR) 20 MG tablet Take 1 tablet (20 mg total) by mouth daily.  . Cholecalciferol (VITAMIN D3 PO) Take 4,000 Units by mouth daily.   . cyanocobalamin (,VITAMIN B-12,) 1000 MCG/ML injection Inject 1,000 mcg into the muscle every 30 (thirty) days.  . diazepam (VALIUM) 5 MG tablet Take 2.5 mg by mouth every 12 (twelve) hours as needed for anxiety.   . DULoxetine (CYMBALTA) 60 MG capsule Take 60 mg by mouth daily.  Marland Kitchen estradiol (ESTRACE) 0.5 MG tablet Take 0.5 mg by mouth daily.   . ferrous sulfate 325 (65 FE) MG tablet Take 1 tablet (325 mg total) by mouth daily.  . fluticasone (FLONASE) 50 MCG/ACT nasal spray Place 2 sprays into both nostrils every morning.   Marland Kitchen glucose blood test strip Use as instructed 4 times daily. E11.65. TrueTrack test strips  . ibuprofen (ADVIL,MOTRIN) 600 MG tablet Take 1 tablet (600 mg total) by mouth every 6 (six) hours as needed. (Patient taking differently: Take 600 mg by mouth every 6 (six) hours as needed for moderate pain. )  . metFORMIN (GLUCOPHAGE) 1000 MG tablet TAKE ONE TABLET BY MOUTH TWICE DAILY WITH A MEAL.  . metoprolol (LOPRESSOR) 50 MG tablet Take 25 mg by mouth daily. Patient takes 1/2 tablet daily  . Multiple Vitamins-Minerals (WOMENS MULTI PO) Take by mouth daily.  . ondansetron (ZOFRAN) 4 MG tablet Take 1 tablet (4 mg total) by mouth every 8 (eight) hours as needed for nausea or vomiting.  . pantoprazole (PROTONIX) 40 MG tablet TAKE  ONE TABLET BY MOUTH DAILY (STOP OMEPRAZOLE)  . TRESIBA FLEXTOUCH 200 UNIT/ML SOPN INJECT 50 UNITS SUBCUTANEOUSLY AT BEDTIME.  Marland Kitchen TURMERIC PO Take by mouth daily.  . [DISCONTINUED] pantoprazole (PROTONIX) 20 MG tablet Take 1 po BID x 2 weeks then once a day  . [DISCONTINUED] acetaminophen (TYLENOL) 500 MG tablet Take 500 mg by mouth every 6 (six) hours as needed for mild pain.  . [DISCONTINUED] ciprofloxacin (CIPRO) 500 MG tablet Take 1 tablet (500 mg total) by mouth 2 (two) times daily. (Patient not taking:  Reported on 02/10/2017)  . [DISCONTINUED] HYDROcodone-acetaminophen (NORCO/VICODIN) 5-325 MG tablet Take 1 tablet by mouth every 6 (six) hours as needed. (Patient not taking: Reported on 02/10/2017)  . [DISCONTINUED] traMADol (ULTRAM) 50 MG tablet Take 50 mg by mouth 3 (three) times daily as needed for moderate pain or severe pain.   . [DISCONTINUED] traMADol-acetaminophen (ULTRACET) 37.5-325 MG tablet Take 1 tablet by mouth every 6 (six) hours as needed. (Patient not taking: Reported on 02/10/2017)   No facility-administered encounter medications on file as of 02/10/2017.      Objective: Blood pressure 130/90, pulse 78, temperature 97.3 F (36.3 C), temperature source Oral, resp. rate 18, height 5' 2"  (1.575 m), weight 188 lb 9.6 oz (85.5 kg). Patient is alert and in no acute distress. She does not have asterixis. Conjunctiva is pink. Sclera is nonicteric Oropharyngeal mucosa is normal. No neck masses or thyromegaly noted. Cardiac exam with regular rhythm normal S1 and S2. No murmur or gallop noted. Lungs are clear to auscultation. Abdomen is full. Bowel sounds are normal. On palpation abdomen is soft with mild tenderness at LLQ hypogastric region and epigastrium. No organomegaly or masses.  No LE edema or clubbing noted.  Labs/studies Results:  Ultrasound from 01/16/2017 Echogenic liver consistent with steatosis. Gallbladder absent. No focal abnormalities noted involving liver. Spleen within normal limits.   Assessment:  #1.  Hematochezia. She possibly has hemorrhoidal bleeding but she is also having severe cramping with her bowel movements. Therefore she could have low-grade colitis. She has history of colonic adenomas and is due for colonoscopy anyway. #2. Cirrhosis. She was diagnosed with cirrhosis in August 2016 based on fibrosis score of F3/F4 based on elastography. She has well preserved hepatic function. She is up-to-date on screening for [place]. She should not slack up on regular  exercise and need to control her diabetes mellitus. #3. Recurrent nausea after breakfast. She could have an element of gastroparesis given that she is diabetic but need to rule out peptic ulcer disease. #4. GERD. She is doing well with therapy. #5. Irritable bowel syndrome. She is having daily symptoms. Will try low dose dicyclomine.   Plan:  Dicyclomine 10 mg by mouth twice a day when necessary Esophagogastroduodenoscopy and colonoscopy under monitored anesthesia care. She will undergo liver ultrasound with elastography in September 2018 Office visit in 6 months.

## 2017-02-10 NOTE — Patient Instructions (Addendum)
Esophagogastroduodenoscopy and colonoscopy to be scheduled. Liver ultrasound with elastography in September 2018

## 2017-02-11 ENCOUNTER — Encounter (INDEPENDENT_AMBULATORY_CARE_PROVIDER_SITE_OTHER): Payer: Self-pay | Admitting: *Deleted

## 2017-02-11 ENCOUNTER — Telehealth (INDEPENDENT_AMBULATORY_CARE_PROVIDER_SITE_OTHER): Payer: Self-pay | Admitting: *Deleted

## 2017-02-11 ENCOUNTER — Other Ambulatory Visit (INDEPENDENT_AMBULATORY_CARE_PROVIDER_SITE_OTHER): Payer: Self-pay | Admitting: *Deleted

## 2017-02-11 DIAGNOSIS — K7460 Unspecified cirrhosis of liver: Secondary | ICD-10-CM

## 2017-02-11 DIAGNOSIS — K589 Irritable bowel syndrome without diarrhea: Secondary | ICD-10-CM | POA: Insufficient documentation

## 2017-02-11 DIAGNOSIS — K7469 Other cirrhosis of liver: Secondary | ICD-10-CM | POA: Insufficient documentation

## 2017-02-11 DIAGNOSIS — Z86010 Personal history of colonic polyps: Secondary | ICD-10-CM

## 2017-02-11 DIAGNOSIS — R110 Nausea: Secondary | ICD-10-CM | POA: Insufficient documentation

## 2017-02-11 DIAGNOSIS — K219 Gastro-esophageal reflux disease without esophagitis: Secondary | ICD-10-CM

## 2017-02-11 DIAGNOSIS — K7581 Nonalcoholic steatohepatitis (NASH): Secondary | ICD-10-CM | POA: Insufficient documentation

## 2017-02-11 NOTE — Telephone Encounter (Signed)
Patient needs trilyte 

## 2017-02-12 ENCOUNTER — Other Ambulatory Visit (INDEPENDENT_AMBULATORY_CARE_PROVIDER_SITE_OTHER): Payer: Self-pay | Admitting: *Deleted

## 2017-02-16 MED ORDER — PEG 3350-KCL-NA BICARB-NACL 420 G PO SOLR: 4000.0000 mL | Freq: Once | ORAL | 0 refills | Status: AC

## 2017-02-18 ENCOUNTER — Ambulatory Visit (INDEPENDENT_AMBULATORY_CARE_PROVIDER_SITE_OTHER): Payer: Medicare Other | Admitting: Orthopedic Surgery

## 2017-02-27 ENCOUNTER — Other Ambulatory Visit: Payer: Self-pay | Admitting: "Endocrinology

## 2017-03-11 ENCOUNTER — Encounter (INDEPENDENT_AMBULATORY_CARE_PROVIDER_SITE_OTHER): Payer: Self-pay

## 2017-03-11 NOTE — Patient Instructions (Signed)
[name]  03/11/2017     @PREFPERIOPPHARMACY @   Your procedure is scheduled on  03/20/2017  Report to [facility] at  52   A.M.  Call this number if you have problems the morning of surgery:  [phone]   Remember:  Do not eat food or drink liquids after midnight.  Take these medicines the morning of surgery with A SIP OF WATER  Diamox, valium, cymbalta, metoprolol, zofran, protonix.   Do not wear jewelry, make-up or nail polish.  Do not wear lotions, powders, or perfumes, or deoderant.  Do not shave 48 hours prior to surgery.  Men may shave face and neck.  Do not bring valuables to the hospital.  [facility] is not responsible for any belongings or valuables.  Contacts, dentures or bridgework may not be worn into surgery.  Leave your suitcase in the car.  After surgery it may be brought to your room.  For patients admitted to the hospital, discharge time will be determined by your treatment team.  Patients discharged the day of surgery will not be allowed to drive home.   Name and phone number of your driver:   family Special instructions:  Follow the diet and prep instructions given to you by Dr Olevia Perches office.  Please read over the following fact sheets that you were given. Anesthesia Post-op Instructions and Care and Recovery After Surgery       Esophagogastroduodenoscopy Esophagogastroduodenoscopy (EGD) is a procedure to examine the lining of the esophagus, stomach, and first part of the small intestine (duodenum). This procedure is done to check for problems such as inflammation, bleeding, ulcers, or growths. During this procedure, a long, flexible, lighted tube with a camera attached (endoscope) is inserted down the throat. Tell a health care provider about:  Any allergies you have.  All medicines you are taking, including vitamins, herbs, eye drops, creams, and over-the-counter medicines.  Any problems you or family members have  had with anesthetic medicines.  Any blood disorders you have.  Any surgeries you have had.  Any medical conditions you have.  Whether you are pregnant or may be pregnant. What are the risks? Generally, this is a safe procedure. However, problems may occur, including:  Infection.  Bleeding.  A tear (perforation) in the esophagus, stomach, or duodenum.  Trouble breathing.  Excessive sweating.  Spasms of the larynx.  A slowed heartbeat.  Low blood pressure.  What happens before the procedure?  Follow instructions from your health care provider about eating or drinking restrictions.  Ask your health care provider about: ? Changing or stopping your regular medicines. This is especially important if you are taking diabetes medicines or blood thinners. ? Taking medicines such as aspirin and ibuprofen. These medicines can thin your blood. Do not take these medicines before your procedure if your health care provider instructs you not to.  Plan to have someone take you home after the procedure.  If you wear dentures, be ready to remove them before the procedure. What happens during the procedure?  To reduce your risk of infection, your health care team will wash or sanitize their hands.  An IV tube will be put in a vein in your hand or arm. You will get medicines and fluids through this tube.  You will be given one or more of the following: ? A medicine to help you relax (sedative). ? A medicine to numb the  area (local anesthetic). This medicine may be sprayed into your throat. It will make you feel more comfortable and keep you from gagging or coughing during the procedure. ? A medicine for pain.  A mouth guard may be placed in your mouth to protect your teeth and to keep you from biting on the endoscope.  You will be asked to lie on your left side.  The endoscope will be lowered down your throat into your esophagus, stomach, and duodenum.  Air will be put into the  endoscope. This will help your health care provider see better.  The lining of your esophagus, stomach, and duodenum will be examined.  Your health care provider may: ? Take a tissue sample so it can be looked at in a lab (biopsy). ? Remove growths. ? Remove objects (foreign bodies) that are stuck. ? Treat any bleeding with medicines or other devices that stop tissue from bleeding. ? Widen (dilate) or stretch narrowed areas of your esophagus and stomach.  The endoscope will be taken out. The procedure may vary among health care providers and hospitals. What happens after the procedure?  Your blood pressure, heart rate, breathing rate, and blood oxygen level will be monitored often until the medicines you were given have worn off.  Do not eat or drink anything until the numbing medicine has worn off and your gag reflex has returned. This information is not intended to replace advice given to you by your health care provider. Make sure you discuss any questions you have with your health care provider. Document Released: 01/23/2005 Document Revised: 02/28/2016 Document Reviewed: 08/16/2015 Elsevier Interactive Patient Education  2018 [employer]. Esophagogastroduodenoscopy, Care After Refer to this sheet in the next few weeks. These instructions provide you with information about caring for yourself after your procedure. Your health care provider may also give you more specific instructions. Your treatment has been planned according to current medical practices, but problems sometimes occur. Call your health care provider if you have any problems or questions after your procedure. What can I expect after the procedure? After the procedure, it is common to have:  A sore throat.  Nausea.  Bloating.  Dizziness.  Fatigue.  Follow these instructions at home:  Do not eat or drink anything until the numbing medicine (local anesthetic) has worn off and your gag reflex has returned. You  will know that the local anesthetic has worn off when you can swallow comfortably.  Do not drive for 24 hours if you received a medicine to help you relax (sedative).  If your health care provider took a tissue sample for testing during the procedure, make sure to get your test results. This is your responsibility. Ask your health care provider or the department performing the test when your results will be ready.  Keep all follow-up visits as told by your health care provider. This is important. Contact a health care provider if:  You cannot stop coughing.  You are not urinating.  You are urinating less than usual. Get help right away if:  You have trouble swallowing.  You cannot eat or drink.  You have throat or chest pain that gets worse.  You are dizzy or light-headed.  You faint.  You have nausea or vomiting.  You have chills.  You have a fever.  You have severe abdominal pain.  You have black, tarry, or bloody stools. This information is not intended to replace advice given to you by your health care provider. Make sure you  discuss any questions you have with your health care provider. Document Released: 09/08/2012 Document Revised: 02/28/2016 Document Reviewed: 08/16/2015 Elsevier Interactive Patient Education  2018 [employer].  Colonoscopy, Adult A colonoscopy is an exam to look at the entire large intestine. During the exam, a lubricated, bendable tube is inserted into the anus and then passed into the rectum, colon, and other parts of the large intestine. A colonoscopy is often done as a part of normal colorectal screening or in response to certain symptoms, such as anemia, persistent diarrhea, abdominal pain, and blood in the stool. The exam can help screen for and diagnose medical problems, including:  Tumors.  Polyps.  Inflammation.  Areas of bleeding.  Tell a health care provider about:  Any allergies you have.  All medicines you are taking,  including vitamins, herbs, eye drops, creams, and over-the-counter medicines.  Any problems you or family members have had with anesthetic medicines.  Any blood disorders you have.  Any surgeries you have had.  Any medical conditions you have.  Any problems you have had passing stool. What are the risks? Generally, this is a safe procedure. However, problems may occur, including:  Bleeding.  A tear in the intestine.  A reaction to medicines given during the exam.  Infection (rare).  What happens before the procedure? Eating and drinking restrictions Follow instructions from your health care provider about eating and drinking, which may include:  A few days before the procedure - follow a low-fiber diet. Avoid nuts, seeds, dried fruit, raw fruits, and vegetables.  1-3 days before the procedure - follow a clear liquid diet. Drink only clear liquids, such as clear broth or bouillon, black coffee or tea, clear juice, clear soft drinks or sports drinks, gelatin dessert, and popsicles. Avoid any liquids that contain red or purple dye.  On the day of the procedure - do not eat or drink anything during the 2 hours before the procedure, or within the time period that your health care provider recommends.  Bowel prep If you were prescribed an oral bowel prep to clean out your colon:  Take it as told by your health care provider. Starting the day before your procedure, you will need to drink a large amount of medicated liquid. The liquid will cause you to have multiple loose stools until your stool is almost clear or light green.  If your skin or anus gets irritated from diarrhea, you may use these to relieve the irritation: ? Medicated wipes, such as adult wet wipes with aloe and vitamin E. ? A skin soothing-product like petroleum jelly.  If you vomit while drinking the bowel prep, take a break for up to 60 minutes and then begin the bowel prep again. If vomiting continues and you  cannot take the bowel prep without vomiting, call your health care provider.  General instructions  Ask your health care provider about changing or stopping your regular medicines. This is especially important if you are taking diabetes medicines or blood thinners.  Plan to have someone take you home from the hospital or clinic. What happens during the procedure?  An IV tube may be inserted into one of your veins.  You will be given medicine to help you relax (sedative).  To reduce your risk of infection: ? Your health care team will wash or sanitize their hands. ? Your anal area will be washed with soap.  You will be asked to lie on your side with your knees bent.  Your health  care provider will lubricate a long, thin, flexible tube. The tube will have a camera and a light on the end.  The tube will be inserted into your anus.  The tube will be gently eased through your rectum and colon.  Air will be delivered into your colon to keep it open. You may feel some pressure or cramping.  The camera will be used to take images during the procedure.  A small tissue sample may be removed from your body to be examined under a microscope (biopsy). If any potential problems are found, the tissue will be sent to a lab for testing.  If small polyps are found, your health care provider may remove them and have them checked for cancer cells.  The tube that was inserted into your anus will be slowly removed. The procedure may vary among health care providers and hospitals. What happens after the procedure?  Your blood pressure, heart rate, breathing rate, and blood oxygen level will be monitored until the medicines you were given have worn off.  Do not drive for 24 hours after the exam.  You may have a small amount of blood in your stool.  You may pass gas and have mild abdominal cramping or bloating due to the air that was used to inflate your colon during the exam.  It is up to you to  get the results of your procedure. Ask your health care provider, or the department performing the procedure, when your results will be ready. This information is not intended to replace advice given to you by your health care provider. Make sure you discuss any questions you have with your health care provider. Document Released: 09/19/2000 Document Revised: 07/23/2016 Document Reviewed: 12/04/2015 Elsevier Interactive Patient Education  2018 [employer].  Colonoscopy, Adult, Care After This sheet gives you information about how to care for yourself after your procedure. Your health care provider may also give you more specific instructions. If you have problems or questions, contact your health care provider. What can I expect after the procedure? After the procedure, it is common to have:  A small amount of blood in your stool for 24 hours after the procedure.  Some gas.  Mild abdominal cramping or bloating.  Follow these instructions at home: General instructions   For the first 24 hours after the procedure: ? Do not drive or use machinery. ? Do not sign important documents. ? Do not drink alcohol. ? Do your regular daily activities at a slower pace than normal. ? Eat soft, easy-to-digest foods. ? Rest often.  Take over-the-counter or prescription medicines only as told by your health care provider.  It is up to you to get the results of your procedure. Ask your health care provider, or the department performing the procedure, when your results will be ready. Relieving cramping and bloating  Try walking around when you have cramps or feel bloated.  Apply heat to your abdomen as told by your health care provider. Use a heat source that your health care provider recommends, such as a moist heat pack or a heating pad. ? Place a towel between your skin and the heat source. ? Leave the heat on for 20-30 minutes. ? Remove the heat if your skin turns bright red. This is  especially important if you are unable to feel pain, heat, or cold. You may have a greater risk of getting burned. Eating and drinking  Drink enough fluid to keep your urine clear or pale yellow.  Resume your normal diet as instructed by your health care provider. Avoid heavy or fried foods that are hard to digest.  Avoid drinking alcohol for as long as instructed by your health care provider. Contact a health care provider if:  You have blood in your stool 2-3 days after the procedure. Get help right away if:  You have more than a small spotting of blood in your stool.  You pass large blood clots in your stool.  Your abdomen is swollen.  You have nausea or vomiting.  You have a fever.  You have increasing abdominal pain that is not relieved with medicine. This information is not intended to replace advice given to you by your health care provider. Make sure you discuss any questions you have with your health care provider. Document Released: 05/06/2004 Document Revised: 06/16/2016 Document Reviewed: 12/04/2015 Elsevier Interactive Patient Education  2018 [place] Anesthesia is a term that refers to techniques, procedures, and medicines that help a person stay safe and comfortable during a medical procedure. Monitored anesthesia care, or sedation, is one type of anesthesia. Your anesthesia specialist may recommend sedation if you will be having a procedure that does not require you to be unconscious, such as:  Cataract surgery.  A dental procedure.  A biopsy.  A colonoscopy.  During the procedure, you may receive a medicine to help you relax (sedative). There are three levels of sedation:  Mild sedation. At this level, you may feel awake and relaxed. You will be able to follow directions.  Moderate sedation. At this level, you will be sleepy. You may not remember the procedure.  Deep sedation. At this level, you will be asleep. You will  not remember the procedure.  The more medicine you are given, the deeper your level of sedation will be. Depending on how you respond to the procedure, the anesthesia specialist may change your level of sedation or the type of anesthesia to fit your needs. An anesthesia specialist will monitor you closely during the procedure. Let your health care provider know about:  Any allergies you have.  All medicines you are taking, including vitamins, herbs, eye drops, creams, and over-the-counter medicines.  Any use of steroids (by mouth or as a cream).  Any problems you or family members have had with sedatives and anesthetic medicines.  Any blood disorders you have.  Any surgeries you have had.  Any medical conditions you have, such as sleep apnea.  Whether you are pregnant or may be pregnant.  Any use of cigarettes, alcohol, or street drugs. What are the risks? Generally, this is a safe procedure. However, problems may occur, including:  Getting too much medicine (oversedation).  Nausea.  Allergic reaction to medicines.  Trouble breathing. If this happens, a breathing tube may be used to help with breathing. It will be removed when you are awake and breathing on your own.  Heart trouble.  Lung trouble.  Before the procedure Staying hydrated Follow instructions from your health care provider about hydration, which may include:  Up to 2 hours before the procedure - you may continue to drink clear liquids, such as water, clear fruit juice, black coffee, and plain tea.  Eating and drinking restrictions Follow instructions from your health care provider about eating and drinking, which may include:  8 hours before the procedure - stop eating heavy meals or foods such as meat, fried foods, or fatty foods.  6 hours before the procedure - stop eating light  meals or foods, such as toast or cereal.  6 hours before the procedure - stop drinking milk or drinks that contain milk.  2  hours before the procedure - stop drinking clear liquids.  Medicines Ask your health care provider about:  Changing or stopping your regular medicines. This is especially important if you are taking diabetes medicines or blood thinners.  Taking medicines such as aspirin and ibuprofen. These medicines can thin your blood. Do not take these medicines before your procedure if your health care provider instructs you not to.  Tests and exams  You will have a physical exam.  You may have blood tests done to show: ? How well your kidneys and liver are working. ? How well your blood can clot.  General instructions  Plan to have someone take you home from the hospital or clinic.  If you will be going home right after the procedure, plan to have someone with you for 24 hours.  What happens during the procedure?  Your blood pressure, heart rate, breathing, level of pain and overall condition will be monitored.  An IV tube will be inserted into one of your veins.  Your anesthesia specialist will give you medicines as needed to keep you comfortable during the procedure. This may mean changing the level of sedation.  The procedure will be performed. After the procedure  Your blood pressure, heart rate, breathing rate, and blood oxygen level will be monitored until the medicines you were given have worn off.  Do not drive for 24 hours if you received a sedative.  You may: ? Feel sleepy, clumsy, or nauseous. ? Feel forgetful about what happened after the procedure. ? Have a sore throat if you had a breathing tube during the procedure. ? Vomit. This information is not intended to replace advice given to you by your health care provider. Make sure you discuss any questions you have with your health care provider. Document Released: 06/18/2005 Document Revised: 02/29/2016 Document Reviewed: 01/13/2016 Elsevier Interactive Patient Education  2018 [place],  Care After These instructions provide you with information about caring for yourself after your procedure. Your health care provider may also give you more specific instructions. Your treatment has been planned according to current medical practices, but problems sometimes occur. Call your health care provider if you have any problems or questions after your procedure. What can I expect after the procedure? After your procedure, it is common to:  Feel sleepy for several hours.  Feel clumsy and have poor balance for several hours.  Feel forgetful about what happened after the procedure.  Have poor judgment for several hours.  Feel nauseous or vomit.  Have a sore throat if you had a breathing tube during the procedure.  Follow these instructions at home: For at least 24 hours after the procedure:   Do not: ? Participate in activities in which you could fall or become injured. ? Drive. ? Use heavy machinery. ? Drink alcohol. ? Take sleeping pills or medicines that cause drowsiness. ? Make important decisions or sign legal documents. ? Take care of children on your own.  Rest. Eating and drinking  Follow the diet that is recommended by your health care provider.  If you vomit, drink water, juice, or soup when you can drink without vomiting.  Make sure you have little or no nausea before eating solid foods. General instructions  Have a responsible adult stay with you until you are awake and alert.  Take over-the-counter and prescription medicines only as told by your health care provider.  If you smoke, do not smoke without supervision.  Keep all follow-up visits as told by your health care provider. This is important. Contact a health care provider if:  You keep feeling nauseous or you keep vomiting.  You feel light-headed.  You develop a rash.  You have a fever. Get help right away if:  You have trouble breathing. This information is not intended to replace  advice given to you by your health care provider. Make sure you discuss any questions you have with your health care provider. Document Released: 01/13/2016 Document Revised: 05/14/2016 Document Reviewed: 01/13/2016 Elsevier Interactive Patient Education  [employer].

## 2017-03-12 DIAGNOSIS — H8109 Meniere's disease, unspecified ear: Secondary | ICD-10-CM | POA: Diagnosis not present

## 2017-03-12 DIAGNOSIS — Z79899 Other long term (current) drug therapy: Secondary | ICD-10-CM | POA: Diagnosis not present

## 2017-03-12 DIAGNOSIS — Z299 Encounter for prophylactic measures, unspecified: Secondary | ICD-10-CM | POA: Diagnosis not present

## 2017-03-12 DIAGNOSIS — Z6834 Body mass index (BMI) 34.0-34.9, adult: Secondary | ICD-10-CM | POA: Diagnosis not present

## 2017-03-12 DIAGNOSIS — K3184 Gastroparesis: Secondary | ICD-10-CM | POA: Diagnosis not present

## 2017-03-12 DIAGNOSIS — M25521 Pain in right elbow: Secondary | ICD-10-CM | POA: Diagnosis not present

## 2017-03-12 DIAGNOSIS — E1143 Type 2 diabetes mellitus with diabetic autonomic (poly)neuropathy: Secondary | ICD-10-CM | POA: Diagnosis not present

## 2017-03-12 DIAGNOSIS — M545 Low back pain: Secondary | ICD-10-CM | POA: Diagnosis not present

## 2017-03-13 ENCOUNTER — Other Ambulatory Visit: Payer: Self-pay | Admitting: "Endocrinology

## 2017-03-13 DIAGNOSIS — M545 Low back pain: Secondary | ICD-10-CM | POA: Diagnosis not present

## 2017-03-13 DIAGNOSIS — E118 Type 2 diabetes mellitus with unspecified complications: Secondary | ICD-10-CM

## 2017-03-13 DIAGNOSIS — M25521 Pain in right elbow: Secondary | ICD-10-CM | POA: Diagnosis not present

## 2017-03-13 DIAGNOSIS — M5136 Other intervertebral disc degeneration, lumbar region: Secondary | ICD-10-CM | POA: Diagnosis not present

## 2017-03-18 ENCOUNTER — Encounter (HOSPITAL_COMMUNITY): Payer: Self-pay

## 2017-03-18 ENCOUNTER — Encounter (HOSPITAL_COMMUNITY)
Admission: RE | Admit: 2017-03-18 | Discharge: 2017-03-18 | Disposition: A | Discharge status: Home / Self Care | Payer: Medicare Other | Source: Ambulatory Visit | Attending: Internal Medicine | Admitting: Internal Medicine

## 2017-03-18 DIAGNOSIS — R609 Edema, unspecified: Secondary | ICD-10-CM | POA: Diagnosis not present

## 2017-03-18 DIAGNOSIS — Z79890 Hormone replacement therapy (postmenopausal): Secondary | ICD-10-CM | POA: Diagnosis not present

## 2017-03-18 DIAGNOSIS — K766 Portal hypertension: Secondary | ICD-10-CM | POA: Diagnosis not present

## 2017-03-18 DIAGNOSIS — K5730 Diverticulosis of large intestine without perforation or abscess without bleeding: Secondary | ICD-10-CM | POA: Diagnosis not present

## 2017-03-18 DIAGNOSIS — K2950 Unspecified chronic gastritis without bleeding: Secondary | ICD-10-CM | POA: Diagnosis not present

## 2017-03-18 DIAGNOSIS — I10 Essential (primary) hypertension: Secondary | ICD-10-CM | POA: Diagnosis not present

## 2017-03-18 DIAGNOSIS — E7800 Pure hypercholesterolemia, unspecified: Secondary | ICD-10-CM | POA: Diagnosis not present

## 2017-03-18 DIAGNOSIS — K644 Residual hemorrhoidal skin tags: Secondary | ICD-10-CM | POA: Diagnosis not present

## 2017-03-18 DIAGNOSIS — F419 Anxiety disorder, unspecified: Secondary | ICD-10-CM | POA: Diagnosis not present

## 2017-03-18 DIAGNOSIS — Z79899 Other long term (current) drug therapy: Secondary | ICD-10-CM | POA: Diagnosis not present

## 2017-03-18 DIAGNOSIS — K3189 Other diseases of stomach and duodenum: Secondary | ICD-10-CM | POA: Diagnosis not present

## 2017-03-18 DIAGNOSIS — K580 Irritable bowel syndrome with diarrhea: Secondary | ICD-10-CM | POA: Diagnosis not present

## 2017-03-18 DIAGNOSIS — R110 Nausea: Secondary | ICD-10-CM | POA: Diagnosis not present

## 2017-03-18 DIAGNOSIS — K633 Ulcer of intestine: Secondary | ICD-10-CM | POA: Diagnosis not present

## 2017-03-18 DIAGNOSIS — K219 Gastro-esophageal reflux disease without esophagitis: Secondary | ICD-10-CM | POA: Diagnosis not present

## 2017-03-18 DIAGNOSIS — K921 Melena: Secondary | ICD-10-CM | POA: Diagnosis not present

## 2017-03-18 DIAGNOSIS — Z7951 Long term (current) use of inhaled steroids: Secondary | ICD-10-CM | POA: Diagnosis not present

## 2017-03-18 DIAGNOSIS — E1140 Type 2 diabetes mellitus with diabetic neuropathy, unspecified: Secondary | ICD-10-CM | POA: Diagnosis not present

## 2017-03-18 DIAGNOSIS — Z86010 Personal history of colonic polyps: Secondary | ICD-10-CM | POA: Diagnosis not present

## 2017-03-18 DIAGNOSIS — Z7984 Long term (current) use of oral hypoglycemic drugs: Secondary | ICD-10-CM | POA: Diagnosis not present

## 2017-03-18 DIAGNOSIS — K7460 Unspecified cirrhosis of liver: Secondary | ICD-10-CM | POA: Diagnosis not present

## 2017-03-18 DIAGNOSIS — D123 Benign neoplasm of transverse colon: Secondary | ICD-10-CM | POA: Diagnosis not present

## 2017-03-18 DIAGNOSIS — D125 Benign neoplasm of sigmoid colon: Secondary | ICD-10-CM | POA: Diagnosis not present

## 2017-03-18 LAB — BASIC METABOLIC PANEL
ANION GAP: 7 (ref 5–15)
BUN: 15 mg/dL (ref 6–20)
CO2: 26 mmol/L (ref 22–32)
Calcium: 9.2 mg/dL (ref 8.9–10.3)
Chloride: 105 mmol/L (ref 101–111)
Creatinine, Ser: 0.8 mg/dL (ref 0.44–1.00)
GFR calc Af Amer: >60 mL/min (ref >60)
GLUCOSE: 216 mg/dL — AB (ref 65–99)
POTASSIUM: 4.5 mmol/L (ref 3.5–5.1)
Sodium: 138 mmol/L (ref 135–145)

## 2017-03-18 LAB — CBC
HEMATOCRIT: 39 % (ref 36.0–46.0)
HEMOGLOBIN: 12.8 g/dL (ref 12.0–15.0)
MCH: 30.6 pg (ref 26.0–34.0)
MCHC: 32.8 g/dL (ref 30.0–36.0)
MCV: 93.3 fL (ref 78.0–100.0)
PLATELETS: 172 10*3/uL (ref 150–400)
RBC: 4.18 MIL/uL (ref 3.87–5.11)
RDW: 14.3 % (ref 11.5–15.5)
WBC: 7.8 10*3/uL (ref 4.0–10.5)

## 2017-03-20 ENCOUNTER — Ambulatory Visit (HOSPITAL_COMMUNITY)
Admission: RE | Admit: 2017-03-20 | Discharge: 2017-03-20 | Disposition: A | Discharge status: Home / Self Care | Payer: Medicare Other | Source: Ambulatory Visit | Attending: Internal Medicine | Admitting: Internal Medicine

## 2017-03-20 ENCOUNTER — Ambulatory Visit (HOSPITAL_COMMUNITY): Payer: Medicare Other | Admitting: Anesthesiology

## 2017-03-20 ENCOUNTER — Encounter (HOSPITAL_COMMUNITY): Payer: Self-pay

## 2017-03-20 ENCOUNTER — Encounter (HOSPITAL_COMMUNITY)
Admission: RE | Disposition: A | Discharge status: Home / Self Care | Payer: Self-pay | Source: Ambulatory Visit | Attending: Internal Medicine

## 2017-03-20 DIAGNOSIS — Z86010 Personal history of colonic polyps: Secondary | ICD-10-CM | POA: Diagnosis not present

## 2017-03-20 DIAGNOSIS — E1140 Type 2 diabetes mellitus with diabetic neuropathy, unspecified: Secondary | ICD-10-CM | POA: Insufficient documentation

## 2017-03-20 DIAGNOSIS — I10 Essential (primary) hypertension: Secondary | ICD-10-CM | POA: Insufficient documentation

## 2017-03-20 DIAGNOSIS — K644 Residual hemorrhoidal skin tags: Secondary | ICD-10-CM | POA: Insufficient documentation

## 2017-03-20 DIAGNOSIS — Z09 Encounter for follow-up examination after completed treatment for conditions other than malignant neoplasm: Secondary | ICD-10-CM | POA: Diagnosis not present

## 2017-03-20 DIAGNOSIS — K2950 Unspecified chronic gastritis without bleeding: Secondary | ICD-10-CM | POA: Insufficient documentation

## 2017-03-20 DIAGNOSIS — K2970 Gastritis, unspecified, without bleeding: Secondary | ICD-10-CM | POA: Diagnosis not present

## 2017-03-20 DIAGNOSIS — K219 Gastro-esophageal reflux disease without esophagitis: Secondary | ICD-10-CM | POA: Insufficient documentation

## 2017-03-20 DIAGNOSIS — Z7984 Long term (current) use of oral hypoglycemic drugs: Secondary | ICD-10-CM | POA: Insufficient documentation

## 2017-03-20 DIAGNOSIS — K3189 Other diseases of stomach and duodenum: Secondary | ICD-10-CM | POA: Diagnosis not present

## 2017-03-20 DIAGNOSIS — Z79899 Other long term (current) drug therapy: Secondary | ICD-10-CM | POA: Insufficient documentation

## 2017-03-20 DIAGNOSIS — Z7951 Long term (current) use of inhaled steroids: Secondary | ICD-10-CM | POA: Insufficient documentation

## 2017-03-20 DIAGNOSIS — E7800 Pure hypercholesterolemia, unspecified: Secondary | ICD-10-CM | POA: Insufficient documentation

## 2017-03-20 DIAGNOSIS — K5730 Diverticulosis of large intestine without perforation or abscess without bleeding: Secondary | ICD-10-CM | POA: Insufficient documentation

## 2017-03-20 DIAGNOSIS — D123 Benign neoplasm of transverse colon: Secondary | ICD-10-CM | POA: Insufficient documentation

## 2017-03-20 DIAGNOSIS — Z79890 Hormone replacement therapy (postmenopausal): Secondary | ICD-10-CM | POA: Insufficient documentation

## 2017-03-20 DIAGNOSIS — R609 Edema, unspecified: Secondary | ICD-10-CM | POA: Diagnosis not present

## 2017-03-20 DIAGNOSIS — D125 Benign neoplasm of sigmoid colon: Secondary | ICD-10-CM | POA: Diagnosis not present

## 2017-03-20 DIAGNOSIS — K633 Ulcer of intestine: Secondary | ICD-10-CM | POA: Insufficient documentation

## 2017-03-20 DIAGNOSIS — K766 Portal hypertension: Secondary | ICD-10-CM | POA: Diagnosis not present

## 2017-03-20 DIAGNOSIS — K7460 Unspecified cirrhosis of liver: Secondary | ICD-10-CM | POA: Diagnosis not present

## 2017-03-20 DIAGNOSIS — K921 Melena: Secondary | ICD-10-CM | POA: Diagnosis not present

## 2017-03-20 DIAGNOSIS — K589 Irritable bowel syndrome without diarrhea: Secondary | ICD-10-CM | POA: Insufficient documentation

## 2017-03-20 DIAGNOSIS — R112 Nausea with vomiting, unspecified: Secondary | ICD-10-CM | POA: Diagnosis not present

## 2017-03-20 DIAGNOSIS — F419 Anxiety disorder, unspecified: Secondary | ICD-10-CM | POA: Insufficient documentation

## 2017-03-20 DIAGNOSIS — R110 Nausea: Secondary | ICD-10-CM | POA: Diagnosis not present

## 2017-03-20 DIAGNOSIS — K580 Irritable bowel syndrome with diarrhea: Secondary | ICD-10-CM | POA: Insufficient documentation

## 2017-03-20 DIAGNOSIS — K7581 Nonalcoholic steatohepatitis (NASH): Secondary | ICD-10-CM | POA: Insufficient documentation

## 2017-03-20 HISTORY — PX: ESOPHAGOGASTRODUODENOSCOPY (EGD) WITH PROPOFOL: SHX5813

## 2017-03-20 HISTORY — PX: COLONOSCOPY WITH PROPOFOL: SHX5780

## 2017-03-20 HISTORY — PX: BIOPSY: SHX5522

## 2017-03-20 HISTORY — PX: POLYPECTOMY: SHX5525

## 2017-03-20 LAB — GLUCOSE, CAPILLARY
GLUCOSE-CAPILLARY: 118 mg/dL — AB (ref 65–99)
GLUCOSE-CAPILLARY: 102 mg/dL — AB (ref 65–99)

## 2017-03-20 SURGERY — ESOPHAGOGASTRODUODENOSCOPY (EGD) WITH PROPOFOL
Anesthesia: Monitor Anesthesia Care

## 2017-03-20 MED ORDER — FENTANYL CITRATE (PF) 100 MCG/2ML IJ SOLN
25.0000 ug | Freq: Once | INTRAMUSCULAR | Status: AC
  Administered 2017-03-20: 25 ug via INTRAVENOUS

## 2017-03-20 MED ORDER — LACTATED RINGERS IV SOLN
INTRAVENOUS | Status: DC
  Administered 2017-03-20: 12:00:00 via INTRAVENOUS

## 2017-03-20 MED ORDER — CHLORHEXIDINE GLUCONATE CLOTH 2 % EX PADS: 6.0000 | MEDICATED_PAD | Freq: Once | CUTANEOUS | Status: DC

## 2017-03-20 MED ORDER — LIDOCAINE HCL 1 % IJ SOLN
INTRAMUSCULAR | Status: DC | PRN
  Administered 2017-03-20: 20 mg via INTRADERMAL

## 2017-03-20 MED ORDER — FENTANYL CITRATE (PF) 100 MCG/2ML IJ SOLN
INTRAMUSCULAR | Status: AC
  Filled 2017-03-20: qty 2

## 2017-03-20 MED ORDER — PROPOFOL 10 MG/ML IV BOLUS
INTRAVENOUS | Status: AC
  Filled 2017-03-20: qty 40

## 2017-03-20 MED ORDER — LIDOCAINE VISCOUS 2 % MT SOLN
OROMUCOSAL | Status: AC
  Filled 2017-03-20: qty 15

## 2017-03-20 MED ORDER — MIDAZOLAM HCL 2 MG/2ML IJ SOLN
1.0000 mg | INTRAMUSCULAR | Status: AC
  Administered 2017-03-20: 2 mg via INTRAVENOUS

## 2017-03-20 MED ORDER — PROPOFOL 10 MG/ML IV BOLUS
INTRAVENOUS | Status: DC | PRN
  Administered 2017-03-20 (×3): 20 mg via INTRAVENOUS

## 2017-03-20 MED ORDER — MIDAZOLAM HCL 2 MG/2ML IJ SOLN
INTRAMUSCULAR | Status: AC
Start: 2017-03-20 — End: 2017-03-20
  Filled 2017-03-20: qty 2

## 2017-03-20 MED ORDER — PROPOFOL 500 MG/50ML IV EMUL
INTRAVENOUS | Status: DC | PRN
  Administered 2017-03-20: 14:00:00 via INTRAVENOUS
  Administered 2017-03-20: 125 ug/kg/min via INTRAVENOUS
  Administered 2017-03-20: 13:00:00 via INTRAVENOUS

## 2017-03-20 MED ORDER — LIDOCAINE VISCOUS 2 % MT SOLN
15.0000 mL | Freq: Once | OROMUCOSAL | Status: AC
  Administered 2017-03-20: 15 mL via OROMUCOSAL

## 2017-03-20 NOTE — Anesthesia Preprocedure Evaluation (Signed)
Anesthesia Evaluation  Patient identified by MRN, date of birth, ID band Patient awake    Reviewed: Allergy & Precautions, NPO status , Patient's Chart, lab work & pertinent test results  Airway Mallampati: II  TM Distance: >3 FB Neck ROM: Full    Dental  (+) Teeth Intact   Pulmonary neg pulmonary ROS,    breath sounds clear to auscultation       Cardiovascular hypertension, Pt. on medications  Rhythm:Regular Rate:Normal     Neuro/Psych PSYCHIATRIC DISORDERS Anxiety  Neuromuscular disease    GI/Hepatic GERD  Medicated,  Endo/Other  diabetes, Type 2, Oral Hypoglycemic Agents  Renal/GU      Musculoskeletal   Abdominal   Peds  Hematology   Anesthesia Other Findings   Reproductive/Obstetrics                             Anesthesia Physical Anesthesia Plan  ASA: III  Anesthesia Plan: MAC   Post-op Pain Management:    Induction: Intravenous  PONV Risk Score and Plan:   Airway Management Planned: Simple Face Mask  Additional Equipment:   Intra-op Plan:   Post-operative Plan:   Informed Consent: I have reviewed the patients History and Physical, chart, labs and discussed the procedure including the risks, benefits and alternatives for the proposed anesthesia with the patient or authorized representative who has indicated his/her understanding and acceptance.     Plan Discussed with:   Anesthesia Plan Comments:         Anesthesia Quick Evaluation

## 2017-03-20 NOTE — Anesthesia Postprocedure Evaluation (Signed)
Anesthesia Post Note  Patient: [name]  Procedure(s) Performed: Procedure(s) (LRB): ESOPHAGOGASTRODUODENOSCOPY (EGD) WITH PROPOFOL (N/A) COLONOSCOPY WITH PROPOFOL (N/A) BIOPSY POLYPECTOMY  Patient location during evaluation: PACU Anesthesia Type: MAC Level of consciousness: awake and alert and oriented Pain management: pain level controlled Respiratory status: spontaneous breathing Cardiovascular status: blood pressure returned to baseline Postop Assessment: no signs of nausea or vomiting Anesthetic complications: no     Last Vitals:  Vitals:   03/20/17 1354 03/20/17 1400  BP: (!) 106/54 97/83  Pulse: 88 86  Resp: 20 19  Temp: 36.6 C     Last Pain:  Vitals:   03/20/17 1354  TempSrc:   PainSc: 4                  Evelina Lore

## 2017-03-20 NOTE — H&P (Signed)
[name] is an 56 y.o. female.   Chief Complaint: Patient is here for EGD and colonoscopy. HPI: Patient is 56 year old Caucasian female with multiple medical problems including chronic GERD and diarrhea felt to be due to IBS who presents with recurrent postprandial nausea without vomiting. She also has been diagnosed with stage F3/F4 cirrhosis. She has history of colonic adenomas. Last exam was in 2013. She also has noted intermittent hematochezia should she had bout with gastroenteritis and was treated with antibiotic. He denies dysphagia melena or weight loss. Family history is negative for CRC.  Past Medical History:  Diagnosis Date  . Anxiety disorder   . Chronic low back pain   . Cirrhosis of liver ([place])   . Colitis   . Diabetes mellitus   . Diabetic neuropathy ([place])   . Diverticulitis   . Fatty liver   . GERD (gastroesophageal reflux disease)   . High cholesterol   . Hypertension   . Irritable bowel syndrome   . Meniere's disease   . Neuropathy   . Pain     Past Surgical History:  Procedure Laterality Date  . ABDOMINAL HYSTERECTOMY    . bladder tact  2005  . CHOLECYSTECTOMY  08/11  . COLONOSCOPY  2208  . COLONOSCOPY  In of September 2014   @ [facility]/Dr,.[name]  . [place]  . HERNIA REPAIR  09/17/11  . [place]  2009   Preformed on the right shoulder  . OVARIAN CYST REMOVAL     right side  . PARTIAL HYSTERECTOMY  2005  . UPPER GASTROINTESTINAL ENDOSCOPY  2008  . URETERAL STENT PLACEMENT      Family History  Problem Relation Age of Onset  . Healthy Mother   . Heart disease Father   . Esophageal cancer Brother   . Healthy Son   . Healthy Son    Social History:  reports that she has never smoked. She has never used smokeless tobacco. She reports that she does not drink alcohol or use drugs.  Allergies:  Allergies  Allergen Reactions  . Flagyl [Metronidazole Hcl] Itching and Rash  . Nsaids     nausea    Medications Prior to  Admission  Medication Sig Dispense Refill  . acetaZOLAMIDE (DIAMOX) 250 MG tablet Take 250 mg by mouth daily.     Marland Kitchen acyclovir (ZOVIRAX) 400 MG tablet Take 1 tablet by mouth 3 (three) times daily as needed (fever blisters).     Marland Kitchen atorvastatin (LIPITOR) 20 MG tablet Take 1 tablet (20 mg total) by mouth daily. 90 tablet 3  . Cholecalciferol (VITAMIN D3 PO) Take 4,000 Units by mouth daily.     . cyanocobalamin (,VITAMIN B-12,) 1000 MCG/ML injection Inject 1,000 mcg into the muscle every 30 (thirty) days.  5  . diazepam (VALIUM) 2 MG tablet Take 2 mg by mouth every 12 (twelve) hours as needed for anxiety.     . dicyclomine (BENTYL) 10 MG capsule Take 1 capsule (10 mg total) by mouth 3 (three) times daily as needed. (Patient taking differently: Take 10 mg by mouth 3 (three) times daily before meals. ) 90 capsule 2  . DULoxetine (CYMBALTA) 60 MG capsule Take 60 mg by mouth daily.    Marland Kitchen estradiol (ESTRACE) 0.5 MG tablet Take 0.5 mg by mouth daily.     . ferrous sulfate 325 (65 FE) MG tablet Take 1 tablet (325 mg total) by mouth daily. (Patient taking differently: Take 325 mg by mouth every other day. )  30 tablet 0  . fluticasone (FLONASE) 50 MCG/ACT nasal spray Place 2 sprays into both nostrils every morning.     Marland Kitchen glucose blood test strip Use as instructed 4 times daily. E11.65. TrueTrack test strips 150 each 5  . ibuprofen (ADVIL,MOTRIN) 600 MG tablet Take 1 tablet (600 mg total) by mouth every 6 (six) hours as needed. (Patient taking differently: Take 600 mg by mouth every 6 (six) hours as needed for moderate pain. ) 30 tablet 0  . metFORMIN (GLUCOPHAGE) 1000 MG tablet TAKE ONE TABLET BY MOUTH TWICE DAILY WITH A MEAL. 60 tablet 0  . metoprolol (LOPRESSOR) 50 MG tablet Take 25 mg by mouth daily. Patient takes 1/2 tablet daily    . Multiple Vitamins-Minerals (WOMENS MULTI PO) Take 1 tablet by mouth daily.     . ondansetron (ZOFRAN) 4 MG tablet Take 1 tablet (4 mg total) by mouth every 8 (eight) hours as  needed for nausea or vomiting. 12 tablet 0  . pantoprazole (PROTONIX) 40 MG tablet TAKE ONE TABLET BY MOUTH DAILY (STOP OMEPRAZOLE)  4  . pioglitazone (ACTOS) 15 MG tablet Take 15 mg by mouth daily as needed (High blood sufar).    . predniSONE (DELTASONE) 5 MG tablet Take 5 mg by mouth See admin instructions. Pt taking tapered dose of 21 tablets for 7 days    . traMADol (ULTRAM) 50 MG tablet Take 50 mg by mouth every 8 (eight) hours as needed.     . TRESIBA FLEXTOUCH 200 UNIT/ML SOPN INJECT 50 UNITS SUBCUTANEOUSLY AT BEDTIME.  4  . Turmeric 500 MG CAPS Take 1 capsule by mouth daily.       Results for orders placed or performed during the hospital encounter of 03/20/17 (from the past 48 hour(s))  Glucose, capillary     Status: Abnormal   Collection Time: 03/20/17 11:35 AM  Result Value Ref Range   Glucose-Capillary 118 (H) 65 - 99 mg/dL   No results found.  ROS  Blood pressure 131/81, pulse 80, temperature 98.1 F (36.7 C), temperature source Oral, resp. rate 16, SpO2 94 %. Physical Exam  Constitutional: She appears well-developed and well-nourished.  HENT:  Mouth/Throat: Oropharynx is clear and moist.  Eyes: Conjunctivae are normal. No scleral icterus.  Neck: No thyromegaly present.  Cardiovascular: Normal rate, regular rhythm and normal heart sounds.   No murmur heard. Respiratory: Effort normal and breath sounds normal.  GI:  Abdomen is full. On palpation is soft with mild tenderness across lower abdomen. No organomegaly or masses.  Musculoskeletal: She exhibits no edema.  Lymphadenopathy:    She has no cervical adenopathy.  Neurological: She is alert.  Skin: Skin is warm and dry.     Assessment/Plan Recurrent nausea. Cirrhosis. Hematochezia and diarrhea felt to be due to IBS. History of  colonic polyps. Diagnostic EGD and colonoscopy under monitored anesthesia care.    Hildred Laser, MD 03/20/2017, 12:46 PM

## 2017-03-20 NOTE — Op Note (Signed)
[facility] Patient Name: [name] Procedure Date: 03/20/2017 11:48 AM MRN: [id number] Date of Birth: 1961/05/11 Attending MD: Hildred Laser , MD CSN: [id number] Age: 56 Admit Type: Outpatient Procedure:                Upper GI endoscopy Indications:              Nausea. Cirrhosis. Providers:                Hildred Laser, MD, Jeanann Lewandowsky. Sharon Seller, RN, Aram Candela Referring MD:             Glenda Chroman, MD Medicines:                Lidocaine spray, Propofol per Anesthesia Complications:            No immediate complications. Estimated Blood Loss:     Estimated blood loss was minimal. Procedure:                Pre-Anesthesia Assessment:                           - Prior to the procedure, a History and Physical                            was performed, and patient medications and                            allergies were reviewed. The patient's tolerance of                            previous anesthesia was also reviewed. The risks                            and benefits of the procedure and the sedation                            options and risks were discussed with the patient.                            All questions were answered, and informed consent                            was obtained. Prior Anticoagulants: The patient has                            taken no previous anticoagulant or antiplatelet                            agents. ASA Grade Assessment: III - A patient with                            severe systemic disease. After reviewing the risks  and benefits, the patient was deemed in                            satisfactory condition to undergo the procedure.                           After obtaining informed consent, the endoscope was                            passed under direct vision. Throughout the                            procedure, the patient's blood pressure, pulse, and   oxygen saturations were monitored continuously. The                            [id number]-[id number] ([id number]) scope was introduced through the                            and advanced to the second part of duodenum. The                            upper GI endoscopy was accomplished without                            difficulty. The patient tolerated the procedure                            well. Scope In: 1:07:51 PM Scope Out: 1:15:53 PM Total Procedure Duration: 0 hours 8 minutes 2 seconds  Findings:      The examined esophagus was normal.      The Z-line was regular and was found 36 cm from the incisors.      Mild portal hypertensive gastropathy was found in the gastric fundus and       in the gastric body.      Patchy mild inflammation characterized by congestion (edema), erythema       and granularity was found in the gastric antrum. Biopsies were taken       with a cold forceps for histology.      The exam of the stomach was otherwise normal.      The duodenal bulb and second portion of the duodenum were normal. Impression:               - Normal esophagus.                           - Z-line regular, 36 cm from the incisors.                           - Portal hypertensive gastropathy.                           - Gastritis. Biopsied.                           - Normal duodenal bulb and second portion of  the                            duodenum. Moderate Sedation:      Per Anesthesia Care Recommendation:           - Patient has a contact number available for                            emergencies. The signs and symptoms of potential                            delayed complications were discussed with the                            patient. Return to normal activities tomorrow.                            Written discharge instructions were provided to the                            patient.                           - Resume previous diet today.                           - Continue present  medications.                           - Await pathology results. Procedure Code(s):        --- Professional ---                           [phone], Esophagogastroduodenoscopy, flexible,                            transoral; with biopsy, single or multiple Diagnosis Code(s):        --- Professional ---                           K76.6, Portal hypertension                           K31.89, Other diseases of stomach and duodenum                           K29.70, Gastritis, unspecified, without bleeding                           R11.0, Nausea CPT copyright 2016 American [employer]. All rights reserved. The codes documented in this report are preliminary and upon coder review may  be revised to meet current compliance requirements. Hildred Laser, MD Hildred Laser, MD 03/20/2017 1:54:06 PM This report has been signed electronically. Number of Addenda: 0

## 2017-03-20 NOTE — Discharge Instructions (Signed)
No aspirin or NSAIDs for 24 hours. Resume usual medications and diet. No driving for 24 hours. Physician will call with biopsy results.    PATIENT INSTRUCTIONS POST-ANESTHESIA  IMMEDIATELY FOLLOWING SURGERY:  Do not drive or operate machinery for the first twenty four hours after surgery.  Do not make any important decisions for twenty four hours after surgery or while taking narcotic pain medications or sedatives.  If you develop intractable nausea and vomiting or a severe headache please notify your doctor immediately.  FOLLOW-UP:  Please make an appointment with your surgeon as instructed. You do not need to follow up with anesthesia unless specifically instructed to do so.  WOUND CARE INSTRUCTIONS (if applicable):  Keep a dry clean dressing on the anesthesia/puncture wound site if there is drainage.  Once the wound has quit draining you may leave it open to air.  Generally you should leave the bandage intact for twenty four hours unless there is drainage.  If the epidural site drains for more than 36-48 hours please call the anesthesia department.  QUESTIONS?:  Please feel free to call your physician or the hospital operator if you have any questions, and they will be happy to assist you.       Esophagogastroduodenoscopy, Care After Refer to this sheet in the next few weeks. These instructions provide you with information about caring for yourself after your procedure. Your health care provider may also give you more specific instructions. Your treatment has been planned according to current medical practices, but problems sometimes occur. Call your health care provider if you have any problems or questions after your procedure. What can I expect after the procedure? After the procedure, it is common to have:  A sore throat.  Nausea.  Bloating.  Dizziness.  Fatigue.  Follow these instructions at home:  Do not eat or drink anything until the numbing medicine (local anesthetic)  has worn off and your gag reflex has returned. You will know that the local anesthetic has worn off when you can swallow comfortably.  Do not drive for 24 hours if you received a medicine to help you relax (sedative).  If your health care provider took a tissue sample for testing during the procedure, make sure to get your test results. This is your responsibility. Ask your health care provider or the department performing the test when your results will be ready.  Keep all follow-up visits as told by your health care provider. This is important. Contact a health care provider if:  You cannot stop coughing.  You are not urinating.  You are urinating less than usual. Get help right away if:  You have trouble swallowing.  You cannot eat or drink.  You have throat or chest pain that gets worse.  You are dizzy or light-headed.  You faint.  You have nausea or vomiting.  You have chills.  You have a fever.  You have severe abdominal pain.  You have black, tarry, or bloody stools. This information is not intended to replace advice given to you by your health care provider. Make sure you discuss any questions you have with your health care provider. Document Released: 09/08/2012 Document Revised: 02/28/2016 Document Reviewed: 08/16/2015 Elsevier Interactive Patient Education  2018 [employer].     Colonoscopy, Adult, Care After This sheet gives you information about how to care for yourself after your procedure. Your doctor may also give you more specific instructions. If you have problems or questions, call your doctor. Follow these instructions  at home: General instructions   For the first 24 hours after the procedure: ? Do not drive or use machinery. ? Do not sign important documents. ? Do not drink alcohol. ? Do your daily activities more slowly than normal. ? Eat foods that are soft and easy to digest. ? Rest often.  Take over-the-counter or prescription  medicines only as told by your doctor.  It is up to you to get the results of your procedure. Ask your doctor, or the department performing the procedure, when your results will be ready. To help cramping and bloating:  Try walking around.  Put heat on your belly (abdomen) as told by your doctor. Use a heat source that your doctor recommends, such as a moist heat pack or a heating pad. ? Put a towel between your skin and the heat source. ? Leave the heat on for 20-30 minutes. ? Remove the heat if your skin turns bright red. This is especially important if you cannot feel pain, heat, or cold. You can get burned. Eating and drinking  Drink enough fluid to keep your pee (urine) clear or pale yellow.  Return to your normal diet as told by your doctor. Avoid heavy or fried foods that are hard to digest.  Avoid drinking alcohol for as long as told by your doctor. Contact a doctor if:  You have blood in your poop (stool) 2-3 days after the procedure. Get help right away if:  You have more than a small amount of blood in your poop.  You see large clumps of tissue (blood clots) in your poop.  Your belly is swollen.  You feel sick to your stomach (nauseous).  You throw up (vomit).  You have a fever.  You have belly pain that gets worse, and medicine does not help your pain. This information is not intended to replace advice given to you by your health care provider. Make sure you discuss any questions you have with your health care provider. Document Released: 10/25/2010 Document Revised: 06/16/2016 Document Reviewed: 06/16/2016 Elsevier Interactive Patient Education  2017 [employer].    Colon Polyps Polyps are tissue growths inside the body. Polyps can grow in many places, including the large intestine (colon). A polyp may be a round bump or a mushroom-shaped growth. You could have one polyp or several. Most colon polyps are noncancerous (benign). However, some colon polyps can  become cancerous over time. What are the causes? The exact cause of colon polyps is not known. What increases the risk? This condition is more likely to develop in people who:  Have a family history of colon cancer or colon polyps.  Are older than 1 or older than 45 if they are African American.  Have inflammatory bowel disease, such as ulcerative colitis or Crohn disease.  Are overweight.  Smoke cigarettes.  Do not get enough exercise.  Drink too much alcohol.  Eat a diet that is: ? High in fat and red meat. ? Low in fiber.  Had childhood cancer that was treated with abdominal radiation.  What are the signs or symptoms? Most polyps do not cause symptoms. If you have symptoms, they may include:  Blood coming from your rectum when having a bowel movement.  Blood in your stool.The stool may look dark red or black.  A change in bowel habits, such as constipation or diarrhea.  How is this diagnosed? This condition is diagnosed with a colonoscopy. This is a procedure that uses a lighted, flexible scope  to look at the inside of your colon. How is this treated? Treatment for this condition involves removing any polyps that are found. Those polyps will then be tested for cancer. If cancer is found, your health care provider will talk to you about options for colon cancer treatment. Follow these instructions at home: Diet  Eat plenty of fiber, such as fruits, vegetables, and whole grains.  Eat foods that are high in calcium and vitamin D, such as milk, cheese, yogurt, eggs, liver, fish, and broccoli.  Limit foods high in fat, red meats, and processed meats, such as hot dogs, sausage, bacon, and lunch meats.  Maintain a healthy weight, or lose weight if recommended by your health care provider. General instructions  Do not smoke cigarettes.  Do not drink alcohol excessively.  Keep all follow-up visits as told by your health care provider. This is important. This  includes keeping regularly scheduled colonoscopies. Talk to your health care provider about when you need a colonoscopy.  Exercise every day or as told by your health care provider. Contact a health care provider if:  You have new or worsening bleeding during a bowel movement.  You have new or increased blood in your stool.  You have a change in bowel habits.  You unexpectedly lose weight. This information is not intended to replace advice given to you by your health care provider. Make sure you discuss any questions you have with your health care provider. Document Released: 06/18/2004 Document Revised: 02/28/2016 Document Reviewed: 08/13/2015 Elsevier Interactive Patient Education  [employer].

## 2017-03-20 NOTE — Op Note (Signed)
[facility] Patient Name: [name] Procedure Date: 03/20/2017 1:16 PM MRN: [id number] Date of Birth: [birth year] Attending MD: Hildred Laser , MD CSN: [id number] Age: 56 Admit Type: Outpatient Procedure:                Colonoscopy Indications:              High risk colon cancer surveillance: Personal                            history of colonic polyps Providers:                Hildred Laser, MD, Otis Peak B. Sharon Seller, RN, Aram Candela Referring MD:             Glenda Chroman, MD Medicines:                Propofol per Anesthesia Complications:            No immediate complications. Estimated Blood Loss:     Estimated blood loss was minimal. Procedure:                Pre-Anesthesia Assessment:                           - Prior to the procedure, a History and Physical                            was performed, and patient medications and                            allergies were reviewed. The patient's tolerance of                            previous anesthesia was also reviewed. The risks                            and benefits of the procedure and the sedation                            options and risks were discussed with the patient.                            All questions were answered, and informed consent                            was obtained. Prior Anticoagulants: The patient has                            taken no previous anticoagulant or antiplatelet                            agents. ASA Grade Assessment: III - A patient with  severe systemic disease. After reviewing the risks                            and benefits, the patient was deemed in                            satisfactory condition to undergo the procedure.                           After obtaining informed consent, the colonoscope                            was passed under direct vision. Throughout the                            procedure, the patient's  blood pressure, pulse, and                            oxygen saturations were monitored continuously. The                            was introduced through the anus and advanced to the                            the terminal ileum, with identification of the                            appendiceal orifice and IC valve. The colonoscopy                            was performed without difficulty. The patient                            tolerated the procedure well. The quality of the                            bowel preparation was adequate. The terminal ileum,                            ileocecal valve, appendiceal orifice, and rectum                            were photographed. Scope In: 1:20:19 PM Scope Out: 1:45:41 PM Scope Withdrawal Time: 0 hours 19 minutes 15 seconds  Total Procedure Duration: 0 hours 25 minutes 22 seconds  Findings:      The perianal examination was normal.      The digital rectal exam findings include decreased sphincter tone.      Two sessile polyps were found in the proximal sigmoid colon and hepatic       flexure. The polyps were 4 to 5 mm in size. These polyps were removed       with a cold snare. Resection and retrieval were complete. The pathology       specimen was placed into Bottle Number 1.  A few small-mouthed diverticula were found in the sigmoid colon.      The exam was otherwise normal throughout the examined colon.      Random biopsies taken from mucosa of sigmoid colon.      External hemorrhoids were found during retroflexion. The hemorrhoids       were small. Impression:               - Decreased sphincter tone found on digital rectal                            exam.                           - Two 4 to 5 mm polyps in the proximal sigmoid                            colon and at the hepatic flexure, removed with a                            cold snare. Resected and retrieved.                           - Diverticulosis in the sigmoid colon.                            - External hemorrhoids. Moderate Sedation:      Per Anesthesia Care Recommendation:           - Patient has a contact number available for                            emergencies. The signs and symptoms of potential                            delayed complications were discussed with the                            patient. Return to normal activities tomorrow.                            Written discharge instructions were provided to the                            patient.                           - Resume previous diet today.                           - Continue present medications.                           - No aspirin, ibuprofen, naproxen, or other                            non-steroidal anti-inflammatory drugs for 1 day.                           -  Await pathology results.                           - Repeat colonoscopy in 5 years for surveillance. Procedure Code(s):        --- Professional ---                           [phone], Colonoscopy, flexible; with removal of                            tumor(s), polyp(s), or other lesion(s) by snare                            technique Diagnosis Code(s):        --- Professional ---                           Z86.010, Personal history of colonic polyps                           K62.89, Other specified diseases of anus and rectum                           D12.5, Benign neoplasm of sigmoid colon                           D12.3, Benign neoplasm of transverse colon (hepatic                            flexure or splenic flexure)                           K64.4, Residual hemorrhoidal skin tags                           K57.30, Diverticulosis of large intestine without                            perforation or abscess without bleeding CPT copyright 2016 American Medical Association. All rights reserved. The codes documented in this report are preliminary and upon coder review may  be revised to meet current compliance  requirements. Hildred Laser, MD Hildred Laser, MD 03/20/2017 2:00:05 PM This report has been signed electronically. Number of Addenda: 0

## 2017-03-20 NOTE — Transfer of Care (Signed)
Immediate Anesthesia Transfer of Care Note  Patient: [name]  Procedure(s) Performed: Procedure(s) with comments: ESOPHAGOGASTRODUODENOSCOPY (EGD) WITH PROPOFOL (N/A) - 12:45 COLONOSCOPY WITH PROPOFOL (N/A) BIOPSY - colon gastric POLYPECTOMY - colon  Patient Location: PACU  Anesthesia Type:MAC  Level of Consciousness: awake and alert   Airway & Oxygen Therapy: Patient Spontanous Breathing  Post-op Assessment: Report given to RN  Post vital signs: Reviewed and stable  Last Vitals:  Vitals:   03/20/17 1143  BP: 131/81  Pulse: 80  Resp: 16  Temp: 36.7 C    Last Pain:  Vitals:   03/20/17 1257  TempSrc:   PainSc: 4          Complications: No apparent anesthesia complications

## 2017-03-25 ENCOUNTER — Encounter (HOSPITAL_COMMUNITY): Payer: Self-pay | Admitting: Internal Medicine

## 2017-03-26 ENCOUNTER — Encounter (INDEPENDENT_AMBULATORY_CARE_PROVIDER_SITE_OTHER): Payer: Self-pay | Admitting: Internal Medicine

## 2017-03-26 DIAGNOSIS — J029 Acute pharyngitis, unspecified: Secondary | ICD-10-CM | POA: Diagnosis not present

## 2017-03-26 DIAGNOSIS — Z6832 Body mass index (BMI) 32.0-32.9, adult: Secondary | ICD-10-CM | POA: Diagnosis not present

## 2017-03-26 DIAGNOSIS — I10 Essential (primary) hypertension: Secondary | ICD-10-CM | POA: Diagnosis not present

## 2017-03-26 DIAGNOSIS — Z299 Encounter for prophylactic measures, unspecified: Secondary | ICD-10-CM | POA: Diagnosis not present

## 2017-03-26 DIAGNOSIS — Z789 Other specified health status: Secondary | ICD-10-CM | POA: Diagnosis not present

## 2017-04-02 ENCOUNTER — Other Ambulatory Visit: Payer: Self-pay | Admitting: "Endocrinology

## 2017-04-02 DIAGNOSIS — M545 Low back pain: Secondary | ICD-10-CM | POA: Diagnosis not present

## 2017-04-07 DIAGNOSIS — Z6833 Body mass index (BMI) 33.0-33.9, adult: Secondary | ICD-10-CM | POA: Diagnosis not present

## 2017-04-07 DIAGNOSIS — M545 Low back pain: Secondary | ICD-10-CM | POA: Diagnosis not present

## 2017-04-07 DIAGNOSIS — E669 Obesity, unspecified: Secondary | ICD-10-CM | POA: Diagnosis not present

## 2017-04-07 DIAGNOSIS — E1165 Type 2 diabetes mellitus with hyperglycemia: Secondary | ICD-10-CM | POA: Diagnosis not present

## 2017-04-07 DIAGNOSIS — E1142 Type 2 diabetes mellitus with diabetic polyneuropathy: Secondary | ICD-10-CM | POA: Diagnosis not present

## 2017-04-07 DIAGNOSIS — F329 Major depressive disorder, single episode, unspecified: Secondary | ICD-10-CM | POA: Diagnosis not present

## 2017-04-07 DIAGNOSIS — K589 Irritable bowel syndrome without diarrhea: Secondary | ICD-10-CM | POA: Diagnosis not present

## 2017-04-07 DIAGNOSIS — I10 Essential (primary) hypertension: Secondary | ICD-10-CM | POA: Diagnosis not present

## 2017-04-07 DIAGNOSIS — Z299 Encounter for prophylactic measures, unspecified: Secondary | ICD-10-CM | POA: Diagnosis not present

## 2017-04-07 DIAGNOSIS — R079 Chest pain, unspecified: Secondary | ICD-10-CM | POA: Diagnosis not present

## 2017-04-07 DIAGNOSIS — F419 Anxiety disorder, unspecified: Secondary | ICD-10-CM | POA: Diagnosis not present

## 2017-04-09 ENCOUNTER — Telehealth (INDEPENDENT_AMBULATORY_CARE_PROVIDER_SITE_OTHER): Payer: Self-pay | Admitting: *Deleted

## 2017-04-09 ENCOUNTER — Encounter (INDEPENDENT_AMBULATORY_CARE_PROVIDER_SITE_OTHER): Payer: Self-pay | Admitting: *Deleted

## 2017-04-09 NOTE — Telephone Encounter (Signed)
Patient left a voice message. She is asking if the other Dr.had looked at results with Dr.Rehman. She also ask that it be documented that she is still in alot of pain, sick to her stomach, hurts bad. The pain is severe. Patient was called and made aware that at this time I have no further result , Dr.REhman would be back in the office next week and we would address this.

## 2017-04-10 DIAGNOSIS — M545 Low back pain: Secondary | ICD-10-CM | POA: Diagnosis not present

## 2017-04-13 ENCOUNTER — Ambulatory Visit (INDEPENDENT_AMBULATORY_CARE_PROVIDER_SITE_OTHER): Payer: Medicare Other | Admitting: Otolaryngology

## 2017-04-13 DIAGNOSIS — H8101 Meniere's disease, right ear: Secondary | ICD-10-CM | POA: Diagnosis not present

## 2017-04-13 DIAGNOSIS — H9041 Sensorineural hearing loss, unilateral, right ear, with unrestricted hearing on the contralateral side: Secondary | ICD-10-CM

## 2017-04-13 DIAGNOSIS — R42 Dizziness and giddiness: Secondary | ICD-10-CM

## 2017-04-13 DIAGNOSIS — H9311 Tinnitus, right ear: Secondary | ICD-10-CM

## 2017-04-14 DIAGNOSIS — M545 Low back pain: Secondary | ICD-10-CM | POA: Diagnosis not present

## 2017-04-15 ENCOUNTER — Other Ambulatory Visit: Payer: Self-pay | Admitting: "Endocrinology

## 2017-04-15 DIAGNOSIS — E118 Type 2 diabetes mellitus with unspecified complications: Secondary | ICD-10-CM | POA: Diagnosis not present

## 2017-04-15 LAB — COMPREHENSIVE METABOLIC PANEL
ALT: 48 U/L — ABNORMAL HIGH (ref 6–29)
AST: 41 U/L — AB (ref 10–35)
Albumin: 3.6 g/dL (ref 3.6–5.1)
Alkaline Phosphatase: 115 U/L (ref 33–130)
BILIRUBIN TOTAL: 0.5 mg/dL (ref 0.2–1.2)
BUN: 14 mg/dL (ref 7–25)
CO2: 22 mmol/L (ref 20–31)
CREATININE: 0.68 mg/dL (ref 0.50–1.05)
Calcium: 9.2 mg/dL (ref 8.6–10.4)
Chloride: 106 mmol/L (ref 98–110)
GLUCOSE: 134 mg/dL — AB (ref 65–99)
Potassium: 4.2 mmol/L (ref 3.5–5.3)
SODIUM: 140 mmol/L (ref 135–146)
Total Protein: 7.2 g/dL (ref 6.1–8.1)

## 2017-04-16 LAB — HEMOGLOBIN A1C
Hgb A1c MFr Bld: 9.1 % — ABNORMAL HIGH (ref <5.7)
Mean Plasma Glucose: 214 mg/dL

## 2017-04-17 DIAGNOSIS — M545 Low back pain: Secondary | ICD-10-CM | POA: Diagnosis not present

## 2017-04-18 NOTE — Telephone Encounter (Signed)
Patient called earlier this week and again today. No answer. Message left for patient to call us on Monday and give [place] a progress report.

## 2017-04-22 ENCOUNTER — Ambulatory Visit: Payer: Medicare Other | Admitting: "Endocrinology

## 2017-04-22 ENCOUNTER — Other Ambulatory Visit (INDEPENDENT_AMBULATORY_CARE_PROVIDER_SITE_OTHER): Payer: Self-pay | Admitting: *Deleted

## 2017-04-22 DIAGNOSIS — R197 Diarrhea, unspecified: Secondary | ICD-10-CM

## 2017-04-22 NOTE — Telephone Encounter (Incomplete)
Called to check on pt--stated stated before/after when using the rest room (bowel movement) still  having lower stomach pain/cramping

## 2017-04-22 NOTE — Telephone Encounter (Signed)
Patient remains with diarrhea and abdominal cramping. She is getting some relief with dicyclomine. Will proceed with GI pathogen panel. Patient will stop by to pick up the paperwork in container for the test. In the meantime she will take dicyclomine 20 mg before breakfast and evening meal and 10 mg for lunch. Superficial ulceration noted on sigmoid biopsy is insignificant.

## 2017-04-22 NOTE — Telephone Encounter (Signed)
Called pt back about Dr. Laural Golden instructions and GI pathogen test

## 2017-04-24 DIAGNOSIS — M545 Low back pain: Secondary | ICD-10-CM | POA: Diagnosis not present

## 2017-04-27 ENCOUNTER — Other Ambulatory Visit (INDEPENDENT_AMBULATORY_CARE_PROVIDER_SITE_OTHER): Payer: Self-pay | Admitting: *Deleted

## 2017-04-27 DIAGNOSIS — R197 Diarrhea, unspecified: Secondary | ICD-10-CM

## 2017-04-27 DIAGNOSIS — M545 Low back pain: Secondary | ICD-10-CM | POA: Diagnosis not present

## 2017-04-27 NOTE — Telephone Encounter (Signed)
I have done another order as the previous one did not print due to no bar code. I also left a message on the patient's cell phone voicemail that she may go and get the containers, fill return to lab for testing.

## 2017-04-30 DIAGNOSIS — R197 Diarrhea, unspecified: Secondary | ICD-10-CM | POA: Diagnosis not present

## 2017-05-01 DIAGNOSIS — M545 Low back pain: Secondary | ICD-10-CM | POA: Diagnosis not present

## 2017-05-04 ENCOUNTER — Other Ambulatory Visit (INDEPENDENT_AMBULATORY_CARE_PROVIDER_SITE_OTHER): Payer: Self-pay | Admitting: Internal Medicine

## 2017-05-04 ENCOUNTER — Other Ambulatory Visit: Payer: Self-pay | Admitting: "Endocrinology

## 2017-05-04 LAB — GASTROINTESTINAL PATHOGEN PANEL PCR
C. difficile Tox A/B, PCR: NOT DETECTED
CAMPYLOBACTER, PCR: NOT DETECTED
Cryptosporidium, PCR: NOT DETECTED
E coli (ETEC) LT/ST PCR: NOT DETECTED
E coli (STEC) stx1/stx2, PCR: NOT DETECTED
E coli 0157, PCR: NOT DETECTED
GIARDIA LAMBLIA, PCR: NOT DETECTED
Norovirus, PCR: NOT DETECTED
ROTAVIRUS, PCR: NOT DETECTED
SALMONELLA, PCR: NOT DETECTED
SHIGELLA, PCR: NOT DETECTED

## 2017-05-07 DIAGNOSIS — K589 Irritable bowel syndrome without diarrhea: Secondary | ICD-10-CM | POA: Diagnosis not present

## 2017-05-07 DIAGNOSIS — Z299 Encounter for prophylactic measures, unspecified: Secondary | ICD-10-CM | POA: Diagnosis not present

## 2017-05-07 DIAGNOSIS — F419 Anxiety disorder, unspecified: Secondary | ICD-10-CM | POA: Diagnosis not present

## 2017-05-07 DIAGNOSIS — E1165 Type 2 diabetes mellitus with hyperglycemia: Secondary | ICD-10-CM | POA: Diagnosis not present

## 2017-05-07 DIAGNOSIS — M545 Low back pain: Secondary | ICD-10-CM | POA: Diagnosis not present

## 2017-05-07 DIAGNOSIS — E1142 Type 2 diabetes mellitus with diabetic polyneuropathy: Secondary | ICD-10-CM | POA: Diagnosis not present

## 2017-05-07 DIAGNOSIS — Z6834 Body mass index (BMI) 34.0-34.9, adult: Secondary | ICD-10-CM | POA: Diagnosis not present

## 2017-05-07 DIAGNOSIS — I10 Essential (primary) hypertension: Secondary | ICD-10-CM | POA: Diagnosis not present

## 2017-05-07 DIAGNOSIS — E669 Obesity, unspecified: Secondary | ICD-10-CM | POA: Diagnosis not present

## 2017-05-07 DIAGNOSIS — F329 Major depressive disorder, single episode, unspecified: Secondary | ICD-10-CM | POA: Diagnosis not present

## 2017-05-13 DIAGNOSIS — M545 Low back pain: Secondary | ICD-10-CM | POA: Diagnosis not present

## 2017-05-13 DIAGNOSIS — M5126 Other intervertebral disc displacement, lumbar region: Secondary | ICD-10-CM | POA: Diagnosis not present

## 2017-05-13 DIAGNOSIS — M48061 Spinal stenosis, lumbar region without neurogenic claudication: Secondary | ICD-10-CM | POA: Diagnosis not present

## 2017-05-13 DIAGNOSIS — M47816 Spondylosis without myelopathy or radiculopathy, lumbar region: Secondary | ICD-10-CM | POA: Diagnosis not present

## 2017-05-14 ENCOUNTER — Encounter: Payer: Self-pay | Admitting: "Endocrinology

## 2017-05-14 ENCOUNTER — Ambulatory Visit (INDEPENDENT_AMBULATORY_CARE_PROVIDER_SITE_OTHER): Payer: Medicare Other | Admitting: "Endocrinology

## 2017-05-14 VITALS — BP 125/82 | HR 97 | Wt 192.0 lb

## 2017-05-14 DIAGNOSIS — Z794 Long term (current) use of insulin: Secondary | ICD-10-CM | POA: Diagnosis not present

## 2017-05-14 DIAGNOSIS — E782 Mixed hyperlipidemia: Secondary | ICD-10-CM | POA: Insufficient documentation

## 2017-05-14 DIAGNOSIS — I10 Essential (primary) hypertension: Secondary | ICD-10-CM | POA: Diagnosis not present

## 2017-05-14 DIAGNOSIS — E1165 Type 2 diabetes mellitus with hyperglycemia: Secondary | ICD-10-CM

## 2017-05-14 DIAGNOSIS — Z6835 Body mass index (BMI) 35.0-35.9, adult: Secondary | ICD-10-CM | POA: Diagnosis not present

## 2017-05-14 DIAGNOSIS — E118 Type 2 diabetes mellitus with unspecified complications: Secondary | ICD-10-CM | POA: Diagnosis not present

## 2017-05-14 DIAGNOSIS — IMO0002 Reserved for concepts with insufficient information to code with codable children: Secondary | ICD-10-CM

## 2017-05-14 MED ORDER — TRESIBA FLEXTOUCH 200 UNIT/ML ~~LOC~~ SOPN: 60.0000 [IU] | PEN_INJECTOR | Freq: Every day | SUBCUTANEOUS | 2 refills | Status: DC

## 2017-05-14 NOTE — Patient Instructions (Signed)
Advice for weight management -For most of us the best way to lose weight is by diet management. Generally speaking, diet management means restricting carbohydrate consumption to minimum possible (and to unprocessed or minimally processed complex starch) and increasing protein intake (animal or plant source), fruits, and vegetables.  -Sticking to a routine mealtime to eat 3 meals a day and avoiding unnecessary snacks is shown to have a big role in weight control.  -It is better to avoid simple carbohydrates including: Cakes, Desserts, Ice Cream, Soda (diet and regular), Sweet Tea, Candies, Chips, Cookies, Artificial Sweeteners, and "Sugar-free" Products.   -Exercise: 30 minutes a day 3-4 days a week, or 150 minutes a week. Combine stretch, strength, and aerobic activities. You may seek evaluation by your heart doctor prior to initiating exercise if you have high risk for heart disease.  -If you are interested, we can schedule a visit with [name], RDN, CDE for individualized nutrition education.  

## 2017-05-14 NOTE — Progress Notes (Signed)
Subjective:    Patient ID: [name], female    DOB: August 20, 1961. Patient is being seen in f/u for management of diabetes requested by  Glenda Chroman, MD  Past Medical History:  Diagnosis Date  . Anxiety disorder   . Chronic low back pain   . Cirrhosis of liver ([place])   . Colitis   . Diabetes mellitus   . Diabetic neuropathy ([place])   . Diverticulitis   . Fatty liver   . GERD (gastroesophageal reflux disease)   . High cholesterol   . Hypertension   . Irritable bowel syndrome   . Meniere's disease   . Neuropathy   . Pain    Past Surgical History:  Procedure Laterality Date  . ABDOMINAL HYSTERECTOMY    . BIOPSY  03/20/2017   Procedure: BIOPSY;  Surgeon: [name], MD;  [facility]: [facility];  Service: Endoscopy;;  colon gastric  . bladder tact  2005  . CHOLECYSTECTOMY  08/11  . COLONOSCOPY  2208  . COLONOSCOPY  In of September 2014   @ [facility]/Dr,.[name]  . COLONOSCOPY WITH PROPOFOL N/A 03/20/2017   Procedure: COLONOSCOPY WITH PROPOFOL;  Surgeon: [name], MD;  Location: [facility];  Service: Endoscopy;  Laterality: N/A;  . ECTOPIC PREGNANCY SURGERY  1996  . ESOPHAGOGASTRODUODENOSCOPY (EGD) WITH PROPOFOL N/A 03/20/2017   Procedure: ESOPHAGOGASTRODUODENOSCOPY (EGD) WITH PROPOFOL;  Surgeon: [name], MD;  Location: [facility];  Service: Endoscopy;  Laterality: N/A;  12:45  . HERNIA REPAIR  09/17/11  . [place]  2009   Preformed on the right shoulder  . OVARIAN CYST REMOVAL     right side  . PARTIAL HYSTERECTOMY  2005  . POLYPECTOMY  03/20/2017   Procedure: POLYPECTOMY;  Surgeon: [name], MD;  Location: [facility];  Service: Endoscopy;;  colon  . UPPER GASTROINTESTINAL ENDOSCOPY  2008  . URETERAL STENT PLACEMENT     Social History   Social History  . Marital status: Divorced    Spouse name: N/A  . Number of children: N/A  . Years of education: N/A   Social History Main Topics  . Smoking status: Never Smoker  . Smokeless  tobacco: Never Used  . Alcohol use No  . Drug use: No  . Sexual activity: Yes    Birth control/ protection: Surgical   Other Topics Concern  . None   Social History Narrative  . None   Outpatient Encounter Prescriptions as of 05/14/2017  Medication Sig  . acetaZOLAMIDE (DIAMOX) 250 MG tablet Take 250 mg by mouth daily.   Marland Kitchen acyclovir (ZOVIRAX) 400 MG tablet Take 1 tablet by mouth 3 (three) times daily as needed (fever blisters).   Marland Kitchen atorvastatin (LIPITOR) 20 MG tablet Take 1 tablet (20 mg total) by mouth daily.  . Cholecalciferol (VITAMIN D3 PO) Take 4,000 Units by mouth daily.   . cyanocobalamin (,VITAMIN B-12,) 1000 MCG/ML injection Inject 1,000 mcg into the muscle every 30 (thirty) days.  . diazepam (VALIUM) 2 MG tablet Take 2 mg by mouth every 12 (twelve) hours as needed for anxiety.   . dicyclomine (BENTYL) 10 MG capsule TAKE ONE CAPSULE BY MOUTH THREE TIMES DAILY AS NEEDED.  . DULoxetine (CYMBALTA) 60 MG capsule Take 60 mg by mouth daily.  Marland Kitchen estradiol (ESTRACE) 0.5 MG tablet Take 0.5 mg by mouth daily.   . ferrous sulfate 325 (65 FE) MG tablet Take 1 tablet (325 mg total) by mouth daily. (Patient taking differently:  Take 325 mg by mouth every other day. )  . fluticasone (FLONASE) 50 MCG/ACT nasal spray Place 2 sprays into both nostrils every morning.   Marland Kitchen ibuprofen (ADVIL,MOTRIN) 600 MG tablet Take 1 tablet (600 mg total) by mouth every 6 (six) hours as needed. (Patient taking differently: Take 600 mg by mouth every 6 (six) hours as needed for moderate pain. )  . metFORMIN (GLUCOPHAGE) 1000 MG tablet TAKE ONE TABLET BY MOUTH TWICE DAILY WITH A MEAL.  . metoprolol (LOPRESSOR) 50 MG tablet Take 25 mg by mouth daily. Patient takes 1/2 tablet daily  . Multiple Vitamins-Minerals (WOMENS MULTI PO) Take 1 tablet by mouth daily.   . ondansetron (ZOFRAN) 4 MG tablet Take 1 tablet (4 mg total) by mouth every 8 (eight) hours as needed for nausea or vomiting.  . pantoprazole (PROTONIX) 40 MG  tablet TAKE ONE TABLET BY MOUTH DAILY (STOP OMEPRAZOLE)  . traMADol (ULTRAM) 50 MG tablet Take 50 mg by mouth every 8 (eight) hours as needed.   . TRESIBA FLEXTOUCH 200 UNIT/ML SOPN Inject 60 Units into the skin at bedtime.  Angelia Mould TEST test strip USE AS DIRECTED FOUR TIMES DAILY  . Turmeric 500 MG CAPS Take 1 capsule by mouth daily.   . [DISCONTINUED] pioglitazone (ACTOS) 15 MG tablet Take 15 mg by mouth daily as needed (High blood sufar).  . [DISCONTINUED] predniSONE (DELTASONE) 5 MG tablet Take 5 mg by mouth See admin instructions. Pt taking tapered dose of 21 tablets for 7 days  . [DISCONTINUED] TRESIBA FLEXTOUCH 200 UNIT/ML SOPN Inject 60 Units into the skin at bedtime.   No facility-administered encounter medications on file as of 05/14/2017.    ALLERGIES: Allergies  Allergen Reactions  . Flagyl [Metronidazole Hcl] Itching and Rash  . Nsaids     nausea   VACCINATION STATUS:  There is no immunization history on file for this patient.  Diabetes  She presents for her follow-up diabetic visit. She has type 2 diabetes mellitus. Onset time: She was diagnosed approximately at age 51 years. Her disease course has been worsening. There are no hypoglycemic associated symptoms. Pertinent negatives for hypoglycemia include no confusion, headaches, pallor or seizures. Associated symptoms include fatigue. Pertinent negatives for diabetes include no chest pain, no polydipsia, no polyphagia and no polyuria. There are no hypoglycemic complications. Symptoms are worsening. There are no diabetic complications. Risk factors for coronary artery disease include diabetes mellitus, dyslipidemia, hypertension, obesity, sedentary lifestyle and family history. Current diabetic treatment includes oral agent (monotherapy) and intensive insulin program (She is on Tresiba 130 units every morning, NovoLog 15 units 3 times a day before meals, Glucovance 5/500 3 times a day, Actos 15 mg by mouth daily.). Her weight is  increasing steadily. She is following a generally unhealthy diet. When asked about meal planning, she reported none. She has not had a previous visit with a dietitian. She never participates in exercise. Her home blood glucose trend is fluctuating dramatically. Her breakfast blood glucose range is generally 140-180 mg/dl. Her overall blood glucose range is 140-180 mg/dl. An ACE inhibitor/angiotensin II receptor blocker is not being taken. Eye exam is current.  Hypertension  This is a chronic problem. The current episode started more than 1 year ago. The problem is controlled. Pertinent negatives include no chest pain, headaches, palpitations or shortness of breath. Risk factors for coronary artery disease include diabetes mellitus, obesity and sedentary lifestyle. Past treatments include beta blockers. Compliance problems include diet and exercise.   Hyperlipidemia  Pertinent  negatives include no chest pain, myalgias or shortness of breath.    Review of Systems  Constitutional: Positive for fatigue. Negative for unexpected weight change.  HENT: Negative for trouble swallowing and voice change.   Eyes: Negative for visual disturbance.  Respiratory: Negative for cough, shortness of breath and wheezing.   Cardiovascular: Negative for chest pain, palpitations and leg swelling.  Gastrointestinal: Negative for diarrhea, nausea and vomiting.  Endocrine: Negative for cold intolerance, heat intolerance, polydipsia, polyphagia and polyuria.  Genitourinary: Negative for dysuria, flank pain and frequency.  Musculoskeletal: Negative for arthralgias and myalgias.  Skin: Negative for color change, pallor, rash and wound.  Neurological: Negative for seizures and headaches.  Psychiatric/Behavioral: Negative for confusion and suicidal ideas.    Objective:    BP 125/82   Pulse 97   Wt 192 lb (87.1 kg)   SpO2 94%   BMI 35.12 kg/m   Wt Readings from Last 3 Encounters:  05/14/17 192 lb (87.1 kg)  03/18/17  194 lb (88 kg)  02/10/17 188 lb 9.6 oz (85.5 kg)    Physical Exam  Constitutional: She is oriented to person, place, and time. She appears well-developed.  Obese.  HENT:  Head: Normocephalic and atraumatic.  Eyes: EOM are normal.  Neck: Normal range of motion. Neck supple. No tracheal deviation present. No thyromegaly present.  Cardiovascular: Normal rate and regular rhythm.   Pulmonary/Chest: Effort normal and breath sounds normal.  Abdominal: Soft. Bowel sounds are normal. There is no tenderness. There is no guarding.  Musculoskeletal: Normal range of motion. She exhibits no edema.  Neurological: She is alert and oriented to person, place, and time. She has normal reflexes. No cranial nerve deficit. Coordination normal.  Skin: Skin is warm and dry. No rash noted. No erythema. No pallor.  Psychiatric: She has a normal mood and affect. Judgment normal.     CMP     Component Value Date/Time   NA 140 04/15/2017 1044   K 4.2 04/15/2017 1044   CL 106 04/15/2017 1044   CO2 22 04/15/2017 1044   GLUCOSE 134 (H) 04/15/2017 1044   BUN 14 04/15/2017 1044   CREATININE 0.68 04/15/2017 1044   CALCIUM 9.2 04/15/2017 1044   PROT 7.2 04/15/2017 1044   ALBUMIN 3.6 04/15/2017 1044   AST 41 (H) 04/15/2017 1044   ALT 48 (H) 04/15/2017 1044   ALKPHOS 115 04/15/2017 1044   BILITOT 0.5 04/15/2017 1044   GFRNONAA >60 03/18/2017 1014   GFRNONAA >89 06/30/2016 1251   GFRAA >60 03/18/2017 1014   GFRAA >89 06/30/2016 1251     Diabetic Labs (most recent): Lab Results  Component Value Date   HGBA1C 9.1 (H) 04/15/2017   HGBA1C 8.5 (H) 12/31/2016   HGBA1C 7.0 (H) 10/01/2016     Assessment & Plan:   1. Uncontrolled type 2 diabetes mellitus with complication, with long-term current use of insulin ([place]) - Patient has currently uncontrolled symptomatic type 2 DM since  56 years of age. - She missed her last appointment, she  came with loss of control of her diabetes with a1c of 9.1% from  7.0%  and weight gain.  Recent labs reviewed.   Her diabetes is complicated by obesity, insulin resistance, and patient remains at a high risk for more acute and chronic complications of diabetes which include CAD, CVA, CKD, retinopathy, and neuropathy. These are all discussed in detail with the patient.  - I have counseled the patient on diet management and weight loss, by adopting a  carbohydrate restricted/protein rich diet.  - Suggestion is made for patient to avoid simple carbohydrates   from her diet including Cakes , Desserts, Ice Cream,  Soda (  diet and regular) , Sweet Tea , Candies,  Chips, Cookies, Artificial Sweeteners,   and "Sugar-free" Products . This will help patient to have stable blood glucose profile and potentially avoid unintended weight gain.  - I encouraged the patient to switch to  unprocessed or minimally processed complex starch and increased protein intake (animal or plant source), fruits, and vegetables.  - Patient is advised to stick to a routine mealtimes to eat 3 meals  a day and avoid unnecessary snacks ( to snack only to correct hypoglycemia).  - The patient will be scheduled with Jearld Fenton, RDN, CDE for individualized DM education.  - I have approached patient with the following individualized plan to manage diabetes and patient agrees:   -  She is asked to initiate strict monitoring of glucose 4 times a day-before meals and at bedtime. -She will return in 1 week with her meter and logs, would be considered for prandial insulin if necessary. - In the meantime, I  advised her to increase her basal insulinTresiba  to 60 units QHS ( previously took 130 units of basal insulin and 45 units of prandial insulin daily). , and  Continue to hold prandial insulin for now, and continue  - Patient is warned not to take insulin without proper monitoring per orders. -Adjustment parameters are given for hypo and hyperglycemia in writing. -Patient is encouraged to call clinic  for blood glucose levels less than 70 or above 200 mg /dl. - I will continue metformin  1000 mg by mouth twice a day ,therapeutically suitable for patient. - Her interval medication list is noted to include Actos 15 mg, I advised her to discontinue Actos.  -  She has history of intolerance for [place], likely would not tolerate other  incretin therapy.  - Patient specific target  A1c;  LDL, HDL, Triglycerides, and  Waist Circumference were discussed in detail.  2) BP/HTN:  controlled. Continue current medications . 3) Lipids/HPL:   Uncontrolled, LDL improving to 119 from 141, I discussed and initiated atorvastatin 20 mg by mouth daily at bedtime during her last visit with me, and will continue. Side effects and precautions discussed with her.  4)  Weight/Diet: CDE Consult has been initiated , she has lost 12 pounds since last July, exercise, and detailed carbohydrates information provided.  5) Chronic Care/Health Maintenance:  -Patient  will be considered for ACE inhibitor's and statins as appropriate, and encouraged to continue to follow up with Ophthalmology, Podiatrist at least yearly or according to recommendations, and advised to   stay away from smoking. I have recommended yearly flu vaccine and pneumonia vaccination at least every 5 years; moderate intensity exercise for up to 150 minutes weekly; and  sleep for at least 7 hours a day.  - 20 minutes of time was spent on the care of this patient , 50% of which was applied for counseling on diabetes complications and their preventions.  - Patient to bring meter and  blood glucose logs during their next visit.   - I advised patient to maintain close follow up with Glenda Chroman, MD for primary care needs.  Follow up plan: - Return in about 2 weeks (around 05/28/2017) for follow up with meter and logs- no labs.  Glade Lloyd, MD Phone: [phone]  Fax: [phone]   05/14/2017, 1:02  PM

## 2017-05-15 DIAGNOSIS — M6281 Muscle weakness (generalized): Secondary | ICD-10-CM | POA: Diagnosis not present

## 2017-05-15 DIAGNOSIS — M545 Low back pain: Secondary | ICD-10-CM | POA: Diagnosis not present

## 2017-05-18 DIAGNOSIS — Z299 Encounter for prophylactic measures, unspecified: Secondary | ICD-10-CM | POA: Diagnosis not present

## 2017-05-18 DIAGNOSIS — Z6834 Body mass index (BMI) 34.0-34.9, adult: Secondary | ICD-10-CM | POA: Diagnosis not present

## 2017-05-18 DIAGNOSIS — M545 Low back pain: Secondary | ICD-10-CM | POA: Diagnosis not present

## 2017-05-18 DIAGNOSIS — Z713 Dietary counseling and surveillance: Secondary | ICD-10-CM | POA: Diagnosis not present

## 2017-05-18 DIAGNOSIS — M5136 Other intervertebral disc degeneration, lumbar region: Secondary | ICD-10-CM | POA: Diagnosis not present

## 2017-05-18 DIAGNOSIS — M6281 Muscle weakness (generalized): Secondary | ICD-10-CM | POA: Diagnosis not present

## 2017-05-18 DIAGNOSIS — I10 Essential (primary) hypertension: Secondary | ICD-10-CM | POA: Diagnosis not present

## 2017-05-19 DIAGNOSIS — H00014 Hordeolum externum left upper eyelid: Secondary | ICD-10-CM | POA: Diagnosis not present

## 2017-05-22 DIAGNOSIS — M545 Low back pain: Secondary | ICD-10-CM | POA: Diagnosis not present

## 2017-05-22 DIAGNOSIS — M6281 Muscle weakness (generalized): Secondary | ICD-10-CM | POA: Diagnosis not present

## 2017-05-25 ENCOUNTER — Encounter (HOSPITAL_COMMUNITY): Payer: Self-pay | Admitting: *Deleted

## 2017-05-25 ENCOUNTER — Emergency Department (HOSPITAL_COMMUNITY)
Admission: EM | Admit: 2017-05-25 | Discharge: 2017-05-25 | Disposition: A | Discharge status: Home / Self Care | Payer: Medicare Other | Attending: Emergency Medicine | Admitting: Emergency Medicine

## 2017-05-25 ENCOUNTER — Emergency Department (HOSPITAL_COMMUNITY): Payer: Medicare Other

## 2017-05-25 DIAGNOSIS — Z79899 Other long term (current) drug therapy: Secondary | ICD-10-CM | POA: Diagnosis not present

## 2017-05-25 DIAGNOSIS — S0993XA Unspecified injury of face, initial encounter: Secondary | ICD-10-CM | POA: Diagnosis not present

## 2017-05-25 DIAGNOSIS — M79632 Pain in left forearm: Secondary | ICD-10-CM | POA: Diagnosis not present

## 2017-05-25 DIAGNOSIS — Y939 Activity, unspecified: Secondary | ICD-10-CM | POA: Diagnosis not present

## 2017-05-25 DIAGNOSIS — Z7984 Long term (current) use of oral hypoglycemic drugs: Secondary | ICD-10-CM | POA: Diagnosis not present

## 2017-05-25 DIAGNOSIS — Y929 Unspecified place or not applicable: Secondary | ICD-10-CM | POA: Insufficient documentation

## 2017-05-25 DIAGNOSIS — W19XXXA Unspecified fall, initial encounter: Secondary | ICD-10-CM | POA: Diagnosis not present

## 2017-05-25 DIAGNOSIS — I10 Essential (primary) hypertension: Secondary | ICD-10-CM | POA: Insufficient documentation

## 2017-05-25 DIAGNOSIS — Y998 Other external cause status: Secondary | ICD-10-CM | POA: Diagnosis not present

## 2017-05-25 DIAGNOSIS — E119 Type 2 diabetes mellitus without complications: Secondary | ICD-10-CM | POA: Insufficient documentation

## 2017-05-25 DIAGNOSIS — R51 Headache: Secondary | ICD-10-CM | POA: Diagnosis not present

## 2017-05-25 DIAGNOSIS — R519 Headache, unspecified: Secondary | ICD-10-CM

## 2017-05-25 MED ORDER — IBUPROFEN 800 MG PO TABS
800.0000 mg | ORAL_TABLET | Freq: Once | ORAL | Status: DC
  Filled 2017-05-25: qty 1

## 2017-05-25 MED ORDER — ACETAMINOPHEN 500 MG PO TABS
1000.0000 mg | ORAL_TABLET | Freq: Once | ORAL | Status: DC
  Filled 2017-05-25: qty 2

## 2017-05-25 NOTE — ED Triage Notes (Signed)
Pt c/o falling tonight, c/o pain to left arm and right side of face area, denies any LOC, does admit to headache,

## 2017-05-25 NOTE — Discharge Instructions (Signed)
Take 4 over the counter ibuprofen tablets 3 times a day or 2 over-the-counter naproxen tablets twice a day for pain. Also take tylenol 1000mg(2 extra strength) four times a day.    

## 2017-05-25 NOTE — ED Provider Notes (Signed)
[facility] DEPT Provider Note   CSN: [id number] Arrival date & time: 05/25/17  0053     History   Chief Complaint Chief Complaint  Patient presents with  . Fall    HPI [name] is a 56 y.o. female.  56 yo F with a cc of a fall.  Thinks it occurred due to her chronic dizziness from Mnire's disease. She landed on her outstretched bilateral arms the right side of her face. Denies LOC. Denies blood thinner use. Complaining mostly of pain to the left forearm that radiates on the thumb side. Fall occurred about an hour ago. Right arm pain had resolved but still having some left arm pain. Still complaining of pain to her face. Denies double vision.   The history is provided by the patient.  Fall  This is a recurrent problem. The current episode started less than 1 hour ago. The problem occurs constantly. The problem has not changed since onset.Associated symptoms include headaches. Pertinent negatives include no chest pain and no shortness of breath. Nothing aggravates the symptoms. Nothing relieves the symptoms. She has tried nothing for the symptoms. The treatment provided no relief.    Past Medical History:  Diagnosis Date  . Anxiety disorder   . Chronic low back pain   . Cirrhosis of liver ([place])   . Colitis   . Diabetes mellitus   . Diabetic neuropathy ([place])   . Diverticulitis   . Fatty liver   . GERD (gastroesophageal reflux disease)   . High cholesterol   . Hypertension   . Irritable bowel syndrome   . Meniere's disease   . Neuropathy   . Pain     Patient Active Problem List   Diagnosis Date Noted  . Mixed hyperlipidemia 05/14/2017  . Gastroesophageal reflux disease without esophagitis 02/11/2017  . History of colonic polyps 02/11/2017  . Nausea without vomiting 02/11/2017  . Irritable bowel syndrome 02/11/2017  . Hepatic cirrhosis ([place]) 02/11/2017  . Pain in left foot 01/22/2017  . Pain in right foot 01/22/2017  . Metatarsal stress fracture of left  foot 11/06/2016  . Diabetic polyneuropathy associated with type 2 diabetes mellitus ([place]) 11/06/2016  . Hypercholesteremia 07/15/2016  . Class 2 severe obesity due to excess calories with serious comorbidity and body mass index (BMI) of 35.0 to 35.9 in adult ([place]) 04/29/2016  . Nausea and vomiting 01/17/2014  . HTN (hypertension) 10/25/2013  . Fatty liver disease, nonalcoholic 89/37/3428  . IBS (irritable bowel syndrome) 02/24/2012  . Type 2 diabetes mellitus, uncontrolled ([place]) 02/24/2012    Past Surgical History:  Procedure Laterality Date  . ABDOMINAL HYSTERECTOMY    . BIOPSY  03/20/2017   Procedure: BIOPSY;  Surgeon: [name], MD;  Location: [facility];  Service: Endoscopy;;  colon gastric  . bladder tact  2005  . CHOLECYSTECTOMY  08/11  . COLONOSCOPY  2208  . COLONOSCOPY  In of September 2014   @ [facility]/Dr,.[name]  . COLONOSCOPY WITH PROPOFOL N/A 03/20/2017   Procedure: COLONOSCOPY WITH PROPOFOL;  Surgeon: [name], MD;  Location: [facility];  Service: Endoscopy;  Laterality: N/A;  . ECTOPIC PREGNANCY SURGERY  1996  . ESOPHAGOGASTRODUODENOSCOPY (EGD) WITH PROPOFOL N/A 03/20/2017   Procedure: ESOPHAGOGASTRODUODENOSCOPY (EGD) WITH PROPOFOL;  Surgeon: [name], MD;  Location: [facility];  Service: Endoscopy;  Laterality: N/A;  12:45  . HERNIA REPAIR  09/17/11  . [place]  2009   Preformed on the right shoulder  . OVARIAN CYST REMOVAL  right side  . PARTIAL HYSTERECTOMY  2005  . POLYPECTOMY  03/20/2017   Procedure: POLYPECTOMY;  Surgeon: [name], MD;  Location: [facility];  Service: Endoscopy;;  colon  . UPPER GASTROINTESTINAL ENDOSCOPY  2008  . URETERAL STENT PLACEMENT      OB History    No data available       Home Medications    Prior to Admission medications   Medication Sig Start Date End Date Taking? Authorizing Provider  acetaZOLAMIDE (DIAMOX) 250 MG tablet Take 250 mg by mouth daily.  04/28/16   [provider]    acyclovir (ZOVIRAX) 400 MG tablet Take 1 tablet by mouth 3 (three) times daily as needed (fever blisters).  06/25/16   [provider]  atorvastatin (LIPITOR) 20 MG tablet Take 1 tablet (20 mg total) by mouth daily. 07/15/16   Cassandria Anger, MD  Cholecalciferol (VITAMIN D3 PO) Take 4,000 Units by mouth daily.     [provider]  cyanocobalamin (,VITAMIN B-12,) 1000 MCG/ML injection Inject 1,000 mcg into the muscle every 30 (thirty) days. 04/14/16   [provider]  diazepam (VALIUM) 2 MG tablet Take 2 mg by mouth every 12 (twelve) hours as needed for anxiety.  04/07/16   [provider]  dicyclomine (BENTYL) 10 MG capsule TAKE ONE CAPSULE BY MOUTH THREE TIMES DAILY AS NEEDED. 05/05/17   Rehman, Mechele Dawley, MD  DULoxetine (CYMBALTA) 60 MG capsule Take 60 mg by mouth daily. 04/28/16   [provider]  estradiol (ESTRACE) 0.5 MG tablet Take 0.5 mg by mouth daily.  01/27/17   [provider]  ferrous sulfate 325 (65 FE) MG tablet Take 1 tablet (325 mg total) by mouth daily. Patient taking differently: Take 325 mg by mouth every other day.  06/26/16   Triplett, Tammy, PA-C  fluticasone (FLONASE) 50 MCG/ACT nasal spray Place 2 sprays into both nostrils every morning.     [provider]  ibuprofen (ADVIL,MOTRIN) 600 MG tablet Take 1 tablet (600 mg total) by mouth every 6 (six) hours as needed. Patient taking differently: Take 600 mg by mouth every 6 (six) hours as needed for moderate pain.  05/15/16   Lily Kocher, PA-C  metFORMIN (GLUCOPHAGE) 1000 MG tablet TAKE ONE TABLET BY MOUTH TWICE DAILY WITH A MEAL. 05/06/17   Cassandria Anger, MD  metoprolol (LOPRESSOR) 50 MG tablet Take 25 mg by mouth daily. Patient takes 1/2 tablet daily    [provider]  Multiple Vitamins-Minerals (WOMENS MULTI PO) Take 1 tablet by mouth daily.     [provider]  ondansetron (ZOFRAN) 4 MG tablet Take 1 tablet (4 mg total) by mouth every 8  (eight) hours as needed for nausea or vomiting. 09/24/16   Rolland Porter, MD  pantoprazole (PROTONIX) 40 MG tablet TAKE ONE TABLET BY MOUTH DAILY (STOP OMEPRAZOLE) 01/13/17   [provider]  traMADol (ULTRAM) 50 MG tablet Take 50 mg by mouth every 8 (eight) hours as needed.     [provider]  TRESIBA FLEXTOUCH 200 UNIT/ML SOPN Inject 60 Units into the skin at bedtime. 05/14/17   Cassandria Anger, MD  TRUETRACK TEST test strip USE AS DIRECTED FOUR TIMES DAILY 04/06/17   Cassandria Anger, MD  Turmeric 500 MG CAPS Take 1 capsule by mouth daily.     [provider]    Family History Family History  Problem Relation Age of Onset  . Healthy Mother   . Heart disease  Father   . Esophageal cancer Brother   . Healthy Son   . Healthy Son     Social History Social History  Substance Use Topics  . Smoking status: Never Smoker  . Smokeless tobacco: Never Used  . Alcohol use No     Allergies   Flagyl [metronidazole hcl] and Nsaids   Review of Systems Review of Systems  Constitutional: Negative for chills and fever.  HENT: Negative for congestion and rhinorrhea.   Eyes: Negative for redness and visual disturbance.  Respiratory: Negative for shortness of breath and wheezing.   Cardiovascular: Negative for chest pain and palpitations.  Gastrointestinal: Negative for nausea and vomiting.  Genitourinary: Negative for dysuria and urgency.  Musculoskeletal: Positive for arthralgias and myalgias.  Skin: Negative for pallor and wound.  Neurological: Positive for headaches. Negative for dizziness.     Physical Exam Updated Vital Signs BP 121/68   Pulse 85   Temp 97.9 F (36.6 C) (Oral)   Resp 18   Ht 5' 2"  (1.575 m)   Wt 86.2 kg (190 lb)   SpO2 96%   BMI 34.75 kg/m   Physical Exam  Constitutional: She is oriented to person, place, and time. She appears well-developed and well-nourished. No distress.  HENT:  Head: Normocephalic.  Small amount of  erythema under the right eye.  EOM intact, no facial nerve palsy. Orbital rim TTP about the R orbit.   Eyes: Pupils are equal, round, and reactive to light. EOM are normal.  Neck: Normal range of motion. Neck supple.  Cardiovascular: Normal rate and regular rhythm.  Exam reveals no gallop and no friction rub.   No murmur heard. Pulmonary/Chest: Effort normal. She has no wheezes. She has no rales.  Abdominal: Soft. She exhibits no distension. There is no tenderness.  Musculoskeletal: She exhibits tenderness. She exhibits no edema.  Mild tenderness though the left forearm.  Worst along the soft tissues.  Full ROM of the elbow, PMS intact distally.  Mild tenderness to the right thumb, no snuffbox tenderness, no edema, full rom  Neurological: She is alert and oriented to person, place, and time.  Skin: Skin is warm and dry. She is not diaphoretic.  Psychiatric: She has a normal mood and affect. Her behavior is normal.  Nursing note and vitals reviewed.    ED Treatments / Results  Labs (all labs ordered are listed, but only abnormal results are displayed) Labs Reviewed - No data to display  EKG  EKG Interpretation None       Radiology Dg Forearm Left  Result Date: 05/25/2017 CLINICAL DATA:  LEFT forearm pain, lost balance and landed on arm. EXAM: LEFT FOREARM - 2 VIEW COMPARISON:  None. FINDINGS: There is no evidence of fracture or other focal bone lesions. Soft tissues are unremarkable. IMPRESSION: Negative. Electronically Signed   By: Elon Alas M.D.   On: 05/25/2017 02:13   Ct Maxillofacial Wo Contrast  Result Date: 05/25/2017 CLINICAL DATA:  Fall tonight with right facial pain. EXAM: CT MAXILLOFACIAL WITHOUT CONTRAST TECHNIQUE: Multidetector CT imaging of the maxillofacial structures was performed. Multiplanar CT image reconstructions were also generated. COMPARISON:  Head CT 11/18/2016 FINDINGS: Osseous: No facial bone fracture. Nasal bone, zygomatic arches, and mandibles  are intact. Slight offset of the right TMJ which is similar to prior head CT. No mandibular dislocation. Orbits: No orbital fracture.  Both orbits and globes are intact. Sinuses: Clear. Soft tissues: Mild soft tissue edema in the region the right cheek. No radiopaque foreign  body. No confluent hematoma. Limited intracranial: No significant or unexpected finding. IMPRESSION: No facial bone fracture. Electronically Signed   By: Jeb Levering M.D.   On: 05/25/2017 02:01    Procedures Procedures (including critical care time)  Medications Ordered in ED Medications  acetaminophen (TYLENOL) tablet 1,000 mg (1,000 mg Oral Refused 05/25/17 0141)  ibuprofen (ADVIL,MOTRIN) tablet 800 mg (800 mg Oral Refused 05/25/17 0141)     Initial Impression / Assessment and Plan / ED Course  I have reviewed the triage vital signs and the nursing notes.  Pertinent labs & imaging results that were available during my care of the patient were reviewed by me and considered in my medical decision making (see chart for details).     56 yo F with a mechanical fall.  Doubt fx based on exam, though patient requests they be performed.   Imaging negative.  Patient refusing tylenol, motrin.  D/c home.   2:31 AM:  I have discussed the diagnosis/risks/treatment options with the patient and family and believe the pt to be eligible for discharge home to follow-up with PCP. We also discussed returning to the ED immediately if new or worsening sx occur. We discussed the sx which are most concerning (e.g., sudden worsening pain, fever, inability to tolerate by mouth) that necessitate immediate return. Medications administered to the patient during their visit and any new prescriptions provided to the patient are listed below.  Medications given during this visit Medications  acetaminophen (TYLENOL) tablet 1,000 mg (1,000 mg Oral Refused 05/25/17 0141)  ibuprofen (ADVIL,MOTRIN) tablet 800 mg (800 mg Oral Refused 05/25/17 0141)      The patient appears reasonably screen and/or stabilized for discharge and I doubt any other medical condition or other [facility] requiring further screening, evaluation, or treatment in the ED at this time prior to discharge.    Final Clinical Impressions(s) / ED Diagnoses   Final diagnoses:  Fall, initial encounter  Left forearm pain  Right facial pain    New Prescriptions New Prescriptions   No medications on file     Deno Etienne, DO 05/25/17 [id number]

## 2017-05-28 ENCOUNTER — Encounter: Payer: Self-pay | Admitting: "Endocrinology

## 2017-05-28 ENCOUNTER — Ambulatory Visit (INDEPENDENT_AMBULATORY_CARE_PROVIDER_SITE_OTHER): Payer: Medicare Other | Admitting: "Endocrinology

## 2017-05-28 VITALS — BP 123/77 | HR 80 | Ht 62.0 in | Wt 190.0 lb

## 2017-05-28 DIAGNOSIS — I10 Essential (primary) hypertension: Secondary | ICD-10-CM | POA: Diagnosis not present

## 2017-05-28 DIAGNOSIS — E1165 Type 2 diabetes mellitus with hyperglycemia: Secondary | ICD-10-CM | POA: Diagnosis not present

## 2017-05-28 DIAGNOSIS — Z6835 Body mass index (BMI) 35.0-35.9, adult: Secondary | ICD-10-CM

## 2017-05-28 DIAGNOSIS — Z794 Long term (current) use of insulin: Secondary | ICD-10-CM | POA: Diagnosis not present

## 2017-05-28 DIAGNOSIS — E782 Mixed hyperlipidemia: Secondary | ICD-10-CM

## 2017-05-28 DIAGNOSIS — IMO0002 Reserved for concepts with insufficient information to code with codable children: Secondary | ICD-10-CM

## 2017-05-28 DIAGNOSIS — E118 Type 2 diabetes mellitus with unspecified complications: Secondary | ICD-10-CM | POA: Diagnosis not present

## 2017-05-28 MED ORDER — FREESTYLE LIBRE SENSOR SYSTEM MISC: 2 refills | Status: DC

## 2017-05-28 MED ORDER — FREESTYLE LITE DEVI: 0 refills | Status: DC

## 2017-05-28 MED ORDER — FREESTYLE LIBRE READER DEVI: 1.0000 | Freq: Once | 0 refills | Status: AC

## 2017-05-28 MED ORDER — GLUCOSE BLOOD VI STRP: ORAL_STRIP | 2 refills | Status: AC

## 2017-05-28 NOTE — Patient Instructions (Signed)
Advice for Weight Management -For most of [place] the best way to lose weight is by diet management. Generally speaking, diet management means restricting carbohydrate consumption to minimum possible (and to unprocessed or minimally processed complex starch) and increasing protein intake (animal or plant source), fruits, and vegetables.  -Sticking to a routine mealtime to eat 3 meals a day and avoiding unnecessary snacks is shown to have a big role in weight control.  -It is better to avoid simple carbohydrates including: Cakes, Sweet Desserts, Ice Cream, Soda (diet and regular), Sweet Tea, Candies, Chips, Cookies, Store Bought Juices, Alcohol in Excess of  1-2 drinks a day, Artificial Sweeteners, and "Sugar-free" Products.   -Exercise: 30 -60 minutes a day 3-4 days a week, or 150 minutes a week. Combine stretch, strength, and aerobic activities. You may seek evaluation by your heart doctor prior to initiating exercise if you have high risk for heart disease.  -If you are interested, we can schedule a visit with Jearld Fenton, RDN, CDE for individualized nutrition education.

## 2017-05-28 NOTE — Progress Notes (Signed)
Subjective:    Patient ID: [name], female    DOB: 07/29/1961. Patient is being seen in f/u for management of diabetes requested by  Glenda Chroman, MD  Past Medical History:  Diagnosis Date  . Anxiety disorder   . Chronic low back pain   . Cirrhosis of liver ([place])   . Colitis   . Diabetes mellitus   . Diabetic neuropathy ([place])   . Diverticulitis   . Fatty liver   . GERD (gastroesophageal reflux disease)   . High cholesterol   . Hypertension   . Irritable bowel syndrome   . Meniere's disease   . Neuropathy   . Pain    Past Surgical History:  Procedure Laterality Date  . ABDOMINAL HYSTERECTOMY    . BIOPSY  03/20/2017   Procedure: BIOPSY;  Surgeon: [name], MD;  [facility]: [facility];  Service: Endoscopy;;  colon gastric  . bladder tact  2005  . CHOLECYSTECTOMY  08/11  . COLONOSCOPY  2208  . COLONOSCOPY  In of September 2014   @ [facility]/Dr,.[name]  . COLONOSCOPY WITH PROPOFOL N/A 03/20/2017   Procedure: COLONOSCOPY WITH PROPOFOL;  Surgeon: [name], MD;  [facility]: [facility];  Service: Endoscopy;  Laterality: N/A;  . ECTOPIC PREGNANCY SURGERY  1996  . ESOPHAGOGASTRODUODENOSCOPY (EGD) WITH PROPOFOL N/A 03/20/2017   Procedure: ESOPHAGOGASTRODUODENOSCOPY (EGD) WITH PROPOFOL;  Surgeon: [name], MD;  [facility];  Service: Endoscopy;  Laterality: N/A;  12:45  . HERNIA REPAIR  09/17/11  . [place]  2009   Preformed on the right shoulder  . OVARIAN CYST REMOVAL     right side  . PARTIAL HYSTERECTOMY  2005  . POLYPECTOMY  03/20/2017   Procedure: POLYPECTOMY;  Surgeon: [name], MD;  [facility]: [facility];  Service: Endoscopy;;  colon  . UPPER GASTROINTESTINAL ENDOSCOPY  2008  . URETERAL STENT PLACEMENT     Social History   Social History  . Marital status: Divorced    Spouse name: N/A  . Number of children: N/A  . Years of education: N/A   Social History Main Topics  . Smoking status: Never Smoker  . Smokeless  tobacco: Never Used  . Alcohol use No  . Drug use: No  . Sexual activity: Yes    Birth control/ protection: Surgical   Other Topics Concern  . None   Social History Narrative  . None   Outpatient Encounter Prescriptions as of 05/28/2017  Medication Sig  . acetaZOLAMIDE (DIAMOX) 250 MG tablet Take 250 mg by mouth daily.   Marland Kitchen acyclovir (ZOVIRAX) 400 MG tablet Take 1 tablet by mouth 3 (three) times daily as needed (fever blisters).   Marland Kitchen atorvastatin (LIPITOR) 20 MG tablet Take 1 tablet (20 mg total) by mouth daily.  . Blood Glucose Monitoring Suppl (FREESTYLE LITE) DEVI Used to monitor blood glucose.  . Cholecalciferol (VITAMIN D3 PO) Take 4,000 Units by mouth daily.   . Continuous Blood Gluc Receiver (FREESTYLE LIBRE READER) DEVI 1 Piece by Does not apply route once.  . Continuous Blood Gluc Sensor (FREESTYLE LIBRE SENSOR SYSTEM) MISC Use one sensor every 10 days.  . cyanocobalamin (,VITAMIN B-12,) 1000 MCG/ML injection Inject 1,000 mcg into the muscle every 30 (thirty) days.  . diazepam (VALIUM) 2 MG tablet Take 2 mg by mouth every 12 (twelve) hours as needed for anxiety.   . dicyclomine (BENTYL) 10 MG capsule TAKE ONE CAPSULE BY MOUTH THREE TIMES DAILY AS  NEEDED.  . DULoxetine (CYMBALTA) 60 MG capsule Take 60 mg by mouth daily.  Marland Kitchen estradiol (ESTRACE) 0.5 MG tablet Take 0.5 mg by mouth daily.   . ferrous sulfate 325 (65 FE) MG tablet Take 1 tablet (325 mg total) by mouth daily. (Patient taking differently: Take 325 mg by mouth every other day. )  . fluticasone (FLONASE) 50 MCG/ACT nasal spray Place 2 sprays into both nostrils every morning.   Marland Kitchen glucose blood (FREESTYLE LITE) test strip Use as instructed  . ibuprofen (ADVIL,MOTRIN) 600 MG tablet Take 1 tablet (600 mg total) by mouth every 6 (six) hours as needed. (Patient taking differently: Take 600 mg by mouth every 6 (six) hours as needed for moderate pain. )  . metFORMIN (GLUCOPHAGE) 1000 MG tablet TAKE ONE TABLET BY MOUTH TWICE DAILY  WITH A MEAL.  . metoprolol (LOPRESSOR) 50 MG tablet Take 25 mg by mouth daily. Patient takes 1/2 tablet daily  . Multiple Vitamins-Minerals (WOMENS MULTI PO) Take 1 tablet by mouth daily.   . ondansetron (ZOFRAN) 4 MG tablet Take 1 tablet (4 mg total) by mouth every 8 (eight) hours as needed for nausea or vomiting.  . pantoprazole (PROTONIX) 40 MG tablet TAKE ONE TABLET BY MOUTH DAILY (STOP OMEPRAZOLE)  . traMADol (ULTRAM) 50 MG tablet Take 50 mg by mouth every 8 (eight) hours as needed.   . TRESIBA FLEXTOUCH 200 UNIT/ML SOPN Inject 60 Units into the skin at bedtime.  . Turmeric 500 MG CAPS Take 1 capsule by mouth daily.   . [DISCONTINUED] TRUETRACK TEST test strip USE AS DIRECTED FOUR TIMES DAILY   No facility-administered encounter medications on file as of 05/28/2017.    ALLERGIES: Allergies  Allergen Reactions  . Flagyl [Metronidazole Hcl] Itching and Rash  . Nsaids     nausea   VACCINATION STATUS:  There is no immunization history on file for this patient.  Diabetes  She presents for her follow-up diabetic visit. She has type 2 diabetes mellitus. Onset time: She was diagnosed approximately at age 37 years. Her disease course has been improving. There are no hypoglycemic associated symptoms. Pertinent negatives for hypoglycemia include no confusion, headaches, pallor or seizures. Associated symptoms include fatigue. Pertinent negatives for diabetes include no chest pain, no polydipsia, no polyphagia and no polyuria. There are no hypoglycemic complications. Symptoms are improving. There are no diabetic complications. Risk factors for coronary artery disease include diabetes mellitus, dyslipidemia, hypertension, obesity, sedentary lifestyle and family history. Current diabetic treatment includes oral agent (monotherapy) and intensive insulin program (She is on Tresiba 50 units  subcutaneously daily at bedtime, metformin 1000 g by mouth twice a day. ). Her weight is stable. She is following a  generally unhealthy diet. When asked about meal planning, she reported none. She has not had a previous visit with a dietitian. She never participates in exercise. Her home blood glucose trend is fluctuating minimally. Her breakfast blood glucose range is generally 130-140 mg/dl. Her lunch blood glucose range is generally 140-180 mg/dl. Her dinner blood glucose range is generally 140-180 mg/dl. Her overall blood glucose range is 140-180 mg/dl. An ACE inhibitor/angiotensin II receptor blocker is not being taken. Eye exam is current.  Hypertension  This is a chronic problem. The current episode started more than 1 year ago. The problem is controlled. Pertinent negatives include no chest pain, headaches, palpitations or shortness of breath. Risk factors for coronary artery disease include diabetes mellitus, obesity and sedentary lifestyle. Past treatments include beta blockers. Compliance problems include  diet and exercise.   Hyperlipidemia  Pertinent negatives include no chest pain, myalgias or shortness of breath.    Review of Systems  Constitutional: Positive for fatigue. Negative for unexpected weight change.  HENT: Negative for trouble swallowing and voice change.   Eyes: Negative for visual disturbance.  Respiratory: Negative for cough, shortness of breath and wheezing.   Cardiovascular: Negative for chest pain, palpitations and leg swelling.  Gastrointestinal: Negative for diarrhea, nausea and vomiting.  Endocrine: Negative for cold intolerance, heat intolerance, polydipsia, polyphagia and polyuria.  Genitourinary: Negative for dysuria, flank pain and frequency.  Musculoskeletal: Negative for arthralgias and myalgias.  Skin: Negative for color change, pallor, rash and wound.  Neurological: Negative for seizures and headaches.  Psychiatric/Behavioral: Negative for confusion and suicidal ideas.    Objective:    BP 123/77   Pulse 80   Ht 5' 2"  (1.575 m)   Wt 190 lb (86.2 kg)   BMI 34.75  kg/m   Wt Readings from Last 3 Encounters:  05/28/17 190 lb (86.2 kg)  05/25/17 190 lb (86.2 kg)  05/14/17 192 lb (87.1 kg)    Physical Exam  Constitutional: She is oriented to person, place, and time. She appears well-developed.  Obese.  HENT:  Head: Normocephalic and atraumatic.  Eyes: EOM are normal.  Neck: Normal range of motion. Neck supple. No tracheal deviation present. No thyromegaly present.  Cardiovascular: Normal rate and regular rhythm.   Pulmonary/Chest: Effort normal and breath sounds normal.  Abdominal: Soft. Bowel sounds are normal. There is no tenderness. There is no guarding.  Musculoskeletal: Normal range of motion. She exhibits no edema.  Neurological: She is alert and oriented to person, place, and time. She has normal reflexes. No cranial nerve deficit. Coordination normal.  Skin: Skin is warm and dry. No rash noted. No erythema. No pallor.  Psychiatric: She has a normal mood and affect. Judgment normal.     CMP     Component Value Date/Time   NA 140 04/15/2017 1044   K 4.2 04/15/2017 1044   CL 106 04/15/2017 1044   CO2 22 04/15/2017 1044   GLUCOSE 134 (H) 04/15/2017 1044   BUN 14 04/15/2017 1044   CREATININE 0.68 04/15/2017 1044   CALCIUM 9.2 04/15/2017 1044   PROT 7.2 04/15/2017 1044   ALBUMIN 3.6 04/15/2017 1044   AST 41 (H) 04/15/2017 1044   ALT 48 (H) 04/15/2017 1044   ALKPHOS 115 04/15/2017 1044   BILITOT 0.5 04/15/2017 1044   GFRNONAA >60 03/18/2017 1014   GFRNONAA >89 06/30/2016 1251   GFRAA >60 03/18/2017 1014   GFRAA >89 06/30/2016 1251     Diabetic Labs (most recent): Lab Results  Component Value Date   HGBA1C 9.1 (H) 04/15/2017   HGBA1C 8.5 (H) 12/31/2016   HGBA1C 7.0 (H) 10/01/2016     Assessment & Plan:   1. Uncontrolled type 2 diabetes mellitus with complication, with long-term current use of insulin ([place]) - Patient has currently uncontrolled symptomatic type 2 DM since  56 years of age. - Since her last appointment,  she documented near target blood glucose profile. - Recently, after long absence from clinic she showed up with loss of control with A1c of 9.1% from 7% and weight gain.  Recent labs reviewed.   Her diabetes is complicated by obesity, insulin resistance, and patient remains at a high risk for more acute and chronic complications of diabetes which include CAD, CVA, CKD, retinopathy, and neuropathy. These are all discussed in detail with  the patient.  - I have counseled the patient on diet management and weight loss, by adopting a carbohydrate restricted/protein rich diet.  -Suggestion is made for her to avoid simple carbohydrates  from her diet including Cakes, Sweet Desserts, Ice Cream, Soda (diet and regular), Sweet Tea, Candies, Chips, Cookies, Store Bought Juices, Alcohol in Excess of  1-2 drinks a day, Artificial Sweeteners, and "Sugar-free" Products. This will help patient to have stable blood glucose profile and potentially avoid unintended weight gain.   - I encouraged the patient to switch to  unprocessed or minimally processed complex starch and increased protein intake (animal or plant source), fruits, and vegetables.  - Patient is advised to stick to a routine mealtimes to eat 3 meals  a day and avoid unnecessary snacks ( to snack only to correct hypoglycemia).  - She declines referral to CDE for now.  - I have approached patient with the following individualized plan to manage diabetes and patient agrees:   -  Based on her glycemic response, she would not require prandial insulin for now.   - She was givenTresiba 60 units however she stayed on 50 units of Tresiba daily at bedtime.  -  ( previously took 130 units of basal insulin and 45 units of prandial insulin daily). - Continue to hold prandial insulin for now. - She will continue to monitor blood glucose at least 2 times a day-daily before breakfast and at bedtime.  -Patient is encouraged to call clinic for blood glucose  levels less than 70 or above 200 mg /dl. - I will continue metformin  1000 mg by mouth twice a day ,therapeutically suitable for patient. - she will benefit from continued glucose monitoring. I discussed and initiated prescription for the [facility] device for her.  -  She has history of intolerance for [place], likely would not tolerate other  incretin therapy.  - Patient specific target  A1c;  LDL, HDL, Triglycerides, and  Waist Circumference were discussed in detail.  2) BP/HTN:  controlled. Continue current medications . 3) Lipids/HPL:   Uncontrolled, LDL improving to 119 from 141, I discussed and initiated atorvastatin 20 mg by mouth daily at bedtime during her last visit with me, and will continue. Side effects and precautions discussed with her.  4)  Weight/Diet: CDE Consult has been initiated, however she didn't show up , she has lost 2 pounds since last visit, exercise, and detailed carbohydrates information provided.  5) Chronic Care/Health Maintenance:  -Patient  will be considered for ACE inhibitor's and statins as appropriate, and encouraged to continue to follow up with Ophthalmology, Podiatrist at least yearly or according to recommendations, and advised to   stay away from smoking. I have recommended yearly flu vaccine and pneumonia vaccination at least every 5 years; moderate intensity exercise for up to 150 minutes weekly; and  sleep for at least 7 hours a day.  - Time spent with the patient: 25 min, of which >50% was spent in reviewing her sugar logs , discussing her hypo- and hyper-glycemic episodes, reviewing  previous labs and insulin doses and developing a plan to avoid hypo- and hyper-glycemia.    - Patient to bring meter and  blood glucose logs during her next visit.   - I advised patient to maintain close follow up with Glenda Chroman, MD for primary care needs.  Follow up plan: - Return in about 8 weeks (around 07/23/2017) for meter, and logs, follow up with pre-visit  labs, meter, and logs.  Glade Lloyd, MD Phone: [phone]  Fax: [phone]  This note was partially dictated with voice recognition software. Similar sounding words can be transcribed inadequately or may not  be corrected upon review.  05/28/2017, 2:47 PM

## 2017-05-29 ENCOUNTER — Encounter (INDEPENDENT_AMBULATORY_CARE_PROVIDER_SITE_OTHER): Payer: Self-pay | Admitting: *Deleted

## 2017-06-04 ENCOUNTER — Other Ambulatory Visit: Payer: Self-pay | Admitting: "Endocrinology

## 2017-06-05 ENCOUNTER — Other Ambulatory Visit: Payer: Self-pay

## 2017-06-05 MED ORDER — GLUCOSE BLOOD VI STRP
ORAL_STRIP | 5 refills | Status: DC
Start: 2017-06-05 — End: 2017-07-23

## 2017-06-09 ENCOUNTER — Telehealth: Payer: Self-pay | Admitting: "Endocrinology

## 2017-06-09 NOTE — Telephone Encounter (Signed)
Pt notified this was sent to [facility]

## 2017-06-09 NOTE — Telephone Encounter (Signed)
[name] is asking for the [facility] please advise?

## 2017-06-10 ENCOUNTER — Other Ambulatory Visit (INDEPENDENT_AMBULATORY_CARE_PROVIDER_SITE_OTHER): Payer: Self-pay | Admitting: Internal Medicine

## 2017-06-10 ENCOUNTER — Telehealth (INDEPENDENT_AMBULATORY_CARE_PROVIDER_SITE_OTHER): Payer: Self-pay | Admitting: *Deleted

## 2017-06-10 DIAGNOSIS — R1013 Epigastric pain: Principal | ICD-10-CM

## 2017-06-10 DIAGNOSIS — R110 Nausea: Secondary | ICD-10-CM

## 2017-06-10 DIAGNOSIS — G8929 Other chronic pain: Secondary | ICD-10-CM

## 2017-06-10 DIAGNOSIS — K7460 Unspecified cirrhosis of liver: Secondary | ICD-10-CM

## 2017-06-10 DIAGNOSIS — IMO0002 Reserved for concepts with insufficient information to code with codable children: Secondary | ICD-10-CM

## 2017-06-10 DIAGNOSIS — Z794 Long term (current) use of insulin: Secondary | ICD-10-CM

## 2017-06-10 DIAGNOSIS — E1165 Type 2 diabetes mellitus with hyperglycemia: Secondary | ICD-10-CM

## 2017-06-10 DIAGNOSIS — E118 Type 2 diabetes mellitus with unspecified complications: Secondary | ICD-10-CM

## 2017-06-10 DIAGNOSIS — K7469 Other cirrhosis of liver: Secondary | ICD-10-CM

## 2017-06-10 DIAGNOSIS — K760 Fatty (change of) liver, not elsewhere classified: Secondary | ICD-10-CM

## 2017-06-10 NOTE — Telephone Encounter (Signed)
Patient is on recall for [place] abd elastography -- need oder please

## 2017-06-15 ENCOUNTER — Other Ambulatory Visit (INDEPENDENT_AMBULATORY_CARE_PROVIDER_SITE_OTHER): Payer: Self-pay | Admitting: *Deleted

## 2017-06-15 DIAGNOSIS — K7030 Alcoholic cirrhosis of liver without ascites: Secondary | ICD-10-CM

## 2017-06-15 DIAGNOSIS — R7401 Elevation of levels of liver transaminase levels: Secondary | ICD-10-CM

## 2017-06-15 DIAGNOSIS — K7400 Hepatic fibrosis, unspecified: Secondary | ICD-10-CM

## 2017-06-15 DIAGNOSIS — R188 Other ascites: Secondary | ICD-10-CM

## 2017-06-15 DIAGNOSIS — R932 Abnormal findings on diagnostic imaging of liver and biliary tract: Secondary | ICD-10-CM

## 2017-06-15 DIAGNOSIS — K7460 Unspecified cirrhosis of liver: Secondary | ICD-10-CM

## 2017-06-15 DIAGNOSIS — R740 Nonspecific elevation of levels of transaminase and lactic acid dehydrogenase [LDH]: Secondary | ICD-10-CM

## 2017-06-15 DIAGNOSIS — R110 Nausea: Secondary | ICD-10-CM

## 2017-06-15 DIAGNOSIS — K760 Fatty (change of) liver, not elsewhere classified: Secondary | ICD-10-CM

## 2017-06-15 NOTE — Telephone Encounter (Signed)
[place] sch'd 07/20/17 at [phone] ([phone]), npo after midnight, left detailed message for patient

## 2017-06-15 NOTE — Telephone Encounter (Signed)
Per NUR [place] in October 2018. Elastrography 2019

## 2017-06-22 DIAGNOSIS — Z6834 Body mass index (BMI) 34.0-34.9, adult: Secondary | ICD-10-CM | POA: Diagnosis not present

## 2017-06-22 DIAGNOSIS — M545 Low back pain: Secondary | ICD-10-CM | POA: Diagnosis not present

## 2017-06-22 DIAGNOSIS — F329 Major depressive disorder, single episode, unspecified: Secondary | ICD-10-CM | POA: Diagnosis not present

## 2017-06-22 DIAGNOSIS — I10 Essential (primary) hypertension: Secondary | ICD-10-CM | POA: Diagnosis not present

## 2017-06-22 DIAGNOSIS — Z299 Encounter for prophylactic measures, unspecified: Secondary | ICD-10-CM | POA: Diagnosis not present

## 2017-06-22 DIAGNOSIS — K589 Irritable bowel syndrome without diarrhea: Secondary | ICD-10-CM | POA: Diagnosis not present

## 2017-06-22 DIAGNOSIS — Z79899 Other long term (current) drug therapy: Secondary | ICD-10-CM | POA: Diagnosis not present

## 2017-06-22 DIAGNOSIS — E1142 Type 2 diabetes mellitus with diabetic polyneuropathy: Secondary | ICD-10-CM | POA: Diagnosis not present

## 2017-06-22 DIAGNOSIS — M79672 Pain in left foot: Secondary | ICD-10-CM | POA: Diagnosis not present

## 2017-06-22 DIAGNOSIS — E1165 Type 2 diabetes mellitus with hyperglycemia: Secondary | ICD-10-CM | POA: Diagnosis not present

## 2017-06-22 DIAGNOSIS — F419 Anxiety disorder, unspecified: Secondary | ICD-10-CM | POA: Diagnosis not present

## 2017-06-24 ENCOUNTER — Other Ambulatory Visit: Payer: Self-pay | Admitting: "Endocrinology

## 2017-06-24 DIAGNOSIS — M7732 Calcaneal spur, left foot: Secondary | ICD-10-CM | POA: Diagnosis not present

## 2017-06-24 DIAGNOSIS — M79672 Pain in left foot: Secondary | ICD-10-CM | POA: Diagnosis not present

## 2017-07-08 DIAGNOSIS — K589 Irritable bowel syndrome without diarrhea: Secondary | ICD-10-CM | POA: Diagnosis not present

## 2017-07-08 DIAGNOSIS — E1142 Type 2 diabetes mellitus with diabetic polyneuropathy: Secondary | ICD-10-CM | POA: Diagnosis not present

## 2017-07-08 DIAGNOSIS — E7800 Pure hypercholesterolemia, unspecified: Secondary | ICD-10-CM | POA: Diagnosis not present

## 2017-07-08 DIAGNOSIS — I10 Essential (primary) hypertension: Secondary | ICD-10-CM | POA: Diagnosis not present

## 2017-07-08 DIAGNOSIS — Z6833 Body mass index (BMI) 33.0-33.9, adult: Secondary | ICD-10-CM | POA: Diagnosis not present

## 2017-07-08 DIAGNOSIS — F419 Anxiety disorder, unspecified: Secondary | ICD-10-CM | POA: Diagnosis not present

## 2017-07-08 DIAGNOSIS — E1165 Type 2 diabetes mellitus with hyperglycemia: Secondary | ICD-10-CM | POA: Diagnosis not present

## 2017-07-08 DIAGNOSIS — Z299 Encounter for prophylactic measures, unspecified: Secondary | ICD-10-CM | POA: Diagnosis not present

## 2017-07-08 DIAGNOSIS — J020 Streptococcal pharyngitis: Secondary | ICD-10-CM | POA: Diagnosis not present

## 2017-07-09 DIAGNOSIS — Z713 Dietary counseling and surveillance: Secondary | ICD-10-CM | POA: Diagnosis not present

## 2017-07-09 DIAGNOSIS — N390 Urinary tract infection, site not specified: Secondary | ICD-10-CM | POA: Diagnosis not present

## 2017-07-09 DIAGNOSIS — Z299 Encounter for prophylactic measures, unspecified: Secondary | ICD-10-CM | POA: Diagnosis not present

## 2017-07-09 DIAGNOSIS — Z6833 Body mass index (BMI) 33.0-33.9, adult: Secondary | ICD-10-CM | POA: Diagnosis not present

## 2017-07-09 DIAGNOSIS — R3911 Hesitancy of micturition: Secondary | ICD-10-CM | POA: Diagnosis not present

## 2017-07-15 DIAGNOSIS — M5116 Intervertebral disc disorders with radiculopathy, lumbar region: Secondary | ICD-10-CM | POA: Diagnosis not present

## 2017-07-15 DIAGNOSIS — E1140 Type 2 diabetes mellitus with diabetic neuropathy, unspecified: Secondary | ICD-10-CM | POA: Diagnosis not present

## 2017-07-15 DIAGNOSIS — M545 Low back pain: Secondary | ICD-10-CM | POA: Diagnosis not present

## 2017-07-15 DIAGNOSIS — M461 Sacroiliitis, not elsewhere classified: Secondary | ICD-10-CM | POA: Diagnosis not present

## 2017-07-17 ENCOUNTER — Other Ambulatory Visit: Payer: Self-pay | Admitting: Neurology

## 2017-07-17 DIAGNOSIS — M545 Low back pain: Principal | ICD-10-CM

## 2017-07-17 DIAGNOSIS — G8929 Other chronic pain: Secondary | ICD-10-CM

## 2017-07-20 ENCOUNTER — Ambulatory Visit (HOSPITAL_COMMUNITY)
Admission: RE | Admit: 2017-07-20 | Discharge: 2017-07-20 | Disposition: A | Discharge status: Home / Self Care | Payer: Medicare Other | Source: Ambulatory Visit | Attending: Internal Medicine | Admitting: Internal Medicine

## 2017-07-20 DIAGNOSIS — R7401 Elevation of levels of liver transaminase levels: Secondary | ICD-10-CM

## 2017-07-20 DIAGNOSIS — M84375A Stress fracture, left foot, initial encounter for fracture: Secondary | ICD-10-CM | POA: Diagnosis not present

## 2017-07-20 DIAGNOSIS — R740 Nonspecific elevation of levels of transaminase and lactic acid dehydrogenase [LDH]: Secondary | ICD-10-CM | POA: Diagnosis present

## 2017-07-20 DIAGNOSIS — R7989 Other specified abnormal findings of blood chemistry: Secondary | ICD-10-CM | POA: Diagnosis not present

## 2017-07-20 DIAGNOSIS — Z9049 Acquired absence of other specified parts of digestive tract: Secondary | ICD-10-CM | POA: Insufficient documentation

## 2017-07-20 DIAGNOSIS — K769 Liver disease, unspecified: Secondary | ICD-10-CM | POA: Diagnosis not present

## 2017-07-20 DIAGNOSIS — K760 Fatty (change of) liver, not elsewhere classified: Secondary | ICD-10-CM

## 2017-07-20 DIAGNOSIS — E118 Type 2 diabetes mellitus with unspecified complications: Secondary | ICD-10-CM | POA: Diagnosis not present

## 2017-07-20 DIAGNOSIS — M79675 Pain in left toe(s): Secondary | ICD-10-CM | POA: Diagnosis not present

## 2017-07-21 LAB — HEMOGLOBIN A1C
HEMOGLOBIN A1C: 8.7 %{Hb} — AB (ref <5.7)
MEAN PLASMA GLUCOSE: 203 (calc)
eAG (mmol/L): 11.2 (calc)

## 2017-07-22 ENCOUNTER — Other Ambulatory Visit (INDEPENDENT_AMBULATORY_CARE_PROVIDER_SITE_OTHER): Payer: Self-pay | Admitting: *Deleted

## 2017-07-22 DIAGNOSIS — M545 Low back pain: Secondary | ICD-10-CM | POA: Diagnosis not present

## 2017-07-22 DIAGNOSIS — E1142 Type 2 diabetes mellitus with diabetic polyneuropathy: Secondary | ICD-10-CM | POA: Diagnosis not present

## 2017-07-22 DIAGNOSIS — F329 Major depressive disorder, single episode, unspecified: Secondary | ICD-10-CM | POA: Diagnosis not present

## 2017-07-22 DIAGNOSIS — K7469 Other cirrhosis of liver: Secondary | ICD-10-CM

## 2017-07-22 DIAGNOSIS — Z6833 Body mass index (BMI) 33.0-33.9, adult: Secondary | ICD-10-CM | POA: Diagnosis not present

## 2017-07-22 DIAGNOSIS — Z299 Encounter for prophylactic measures, unspecified: Secondary | ICD-10-CM | POA: Diagnosis not present

## 2017-07-22 DIAGNOSIS — Z1331 Encounter for screening for depression: Secondary | ICD-10-CM | POA: Diagnosis not present

## 2017-07-23 ENCOUNTER — Ambulatory Visit (INDEPENDENT_AMBULATORY_CARE_PROVIDER_SITE_OTHER): Payer: Medicare Other | Admitting: "Endocrinology

## 2017-07-23 ENCOUNTER — Encounter: Payer: Self-pay | Admitting: "Endocrinology

## 2017-07-23 VITALS — BP 116/78 | HR 62 | Ht 62.0 in | Wt 186.0 lb

## 2017-07-23 DIAGNOSIS — E1165 Type 2 diabetes mellitus with hyperglycemia: Secondary | ICD-10-CM

## 2017-07-23 DIAGNOSIS — Z6835 Body mass index (BMI) 35.0-35.9, adult: Secondary | ICD-10-CM

## 2017-07-23 DIAGNOSIS — E118 Type 2 diabetes mellitus with unspecified complications: Secondary | ICD-10-CM | POA: Diagnosis not present

## 2017-07-23 DIAGNOSIS — I10 Essential (primary) hypertension: Secondary | ICD-10-CM

## 2017-07-23 DIAGNOSIS — Z794 Long term (current) use of insulin: Secondary | ICD-10-CM | POA: Diagnosis not present

## 2017-07-23 DIAGNOSIS — E782 Mixed hyperlipidemia: Secondary | ICD-10-CM | POA: Diagnosis not present

## 2017-07-23 DIAGNOSIS — IMO0002 Reserved for concepts with insufficient information to code with codable children: Secondary | ICD-10-CM

## 2017-07-23 NOTE — Progress Notes (Signed)
Subjective:    Patient ID: [name], female    DOB: March 18, 1961. Patient is being seen in f/u for management of diabetes requested by  Glenda Chroman, MD  Past Medical History:  Diagnosis Date  . Anxiety disorder   . Chronic low back pain   . Cirrhosis of liver ([place])   . Colitis   . Diabetes mellitus   . Diabetic neuropathy ([place])   . Diverticulitis   . Fatty liver   . GERD (gastroesophageal reflux disease)   . High cholesterol   . Hypertension   . Irritable bowel syndrome   . Meniere's disease   . Neuropathy   . Pain    Past Surgical History:  Procedure Laterality Date  . ABDOMINAL HYSTERECTOMY    . BIOPSY  03/20/2017   Procedure: BIOPSY;  Surgeon: [name], MD;  [facility]: [facility];  Service: Endoscopy;;  colon gastric  . bladder tact  2005  . CHOLECYSTECTOMY  08/11  . COLONOSCOPY  2208  . COLONOSCOPY  In of September 2014   @ [facility]/Dr,.[name]  . COLONOSCOPY WITH PROPOFOL N/A 03/20/2017   Procedure: COLONOSCOPY WITH PROPOFOL;  Surgeon: [name], MD;  [facility]: [facility];  Service: Endoscopy;  Laterality: N/A;  . ECTOPIC PREGNANCY SURGERY  1996  . ESOPHAGOGASTRODUODENOSCOPY (EGD) WITH PROPOFOL N/A 03/20/2017   Procedure: ESOPHAGOGASTRODUODENOSCOPY (EGD) WITH PROPOFOL;  Surgeon: [name], MD;  [facility];  Service: Endoscopy;  Laterality: N/A;  12:45  . HERNIA REPAIR  09/17/11  . [place]  2009   Preformed on the right shoulder  . OVARIAN CYST REMOVAL     right side  . PARTIAL HYSTERECTOMY  2005  . POLYPECTOMY  03/20/2017   Procedure: POLYPECTOMY;  Surgeon: [name], MD;  [facility]: [facility];  Service: Endoscopy;;  colon  . UPPER GASTROINTESTINAL ENDOSCOPY  2008  . URETERAL STENT PLACEMENT     Social History   Social History  . Marital status: Divorced    Spouse name: N/A  . Number of children: N/A  . Years of education: N/A   Social History Main Topics  . Smoking status: Never Smoker  . Smokeless  tobacco: Never Used  . Alcohol use No  . Drug use: No  . Sexual activity: Yes    Birth control/ protection: Surgical   Other Topics Concern  . None   Social History Narrative  . None   Outpatient Encounter Prescriptions as of 07/23/2017  Medication Sig  . acetaZOLAMIDE (DIAMOX) 250 MG tablet Take 250 mg by mouth daily.   Marland Kitchen acyclovir (ZOVIRAX) 400 MG tablet Take 1 tablet by mouth 3 (three) times daily as needed (fever blisters).   Marland Kitchen atorvastatin (LIPITOR) 20 MG tablet TAKE ONE TABLET BY MOUTH DAILY.  Marland Kitchen Blood Glucose Monitoring Suppl (FREESTYLE LITE) DEVI Used to monitor blood glucose.  . Cholecalciferol (VITAMIN D3 PO) Take 4,000 Units by mouth daily.   . Continuous Blood Gluc Sensor (FREESTYLE LIBRE SENSOR SYSTEM) MISC Use one sensor every 10 days.  . cyanocobalamin (,VITAMIN B-12,) 1000 MCG/ML injection Inject 1,000 mcg into the muscle every 30 (thirty) days.  . diazepam (VALIUM) 2 MG tablet Take 2 mg by mouth every 12 (twelve) hours as needed for anxiety.   . dicyclomine (BENTYL) 10 MG capsule TAKE ONE CAPSULE BY MOUTH THREE TIMES DAILY AS NEEDED.  . DULoxetine (CYMBALTA) 60 MG capsule Take 60 mg by mouth daily.  Marland Kitchen estradiol (ESTRACE) 0.5 MG tablet  Take 0.5 mg by mouth daily.   . ferrous sulfate 325 (65 FE) MG tablet Take 1 tablet (325 mg total) by mouth daily. (Patient taking differently: Take 325 mg by mouth every other day. )  . fluticasone (FLONASE) 50 MCG/ACT nasal spray Place 2 sprays into both nostrils every morning.   Marland Kitchen glucose blood (FREESTYLE LITE) test strip Use as instructed  . ibuprofen (ADVIL,MOTRIN) 600 MG tablet Take 1 tablet (600 mg total) by mouth every 6 (six) hours as needed. (Patient taking differently: Take 600 mg by mouth every 6 (six) hours as needed for moderate pain. )  . metFORMIN (GLUCOPHAGE) 1000 MG tablet TAKE ONE TABLET BY MOUTH TWICE DAILY WITH A MEAL.  . metoprolol (LOPRESSOR) 50 MG tablet Take 25 mg by mouth daily. Patient takes 1/2 tablet daily  .  Multiple Vitamins-Minerals (WOMENS MULTI PO) Take 1 tablet by mouth daily.   . ondansetron (ZOFRAN) 4 MG tablet Take 1 tablet (4 mg total) by mouth every 8 (eight) hours as needed for nausea or vomiting.  . pantoprazole (PROTONIX) 40 MG tablet TAKE ONE TABLET BY MOUTH DAILY (STOP OMEPRAZOLE)  . PARoxetine (PAXIL) 10 MG tablet daily.  Marland Kitchen PRODIGY TWIST TOP LANCETS 28G MISC TEST TWICE DAILY  . traMADol (ULTRAM) 50 MG tablet Take 50 mg by mouth every 8 (eight) hours as needed.   . TRESIBA FLEXTOUCH 200 UNIT/ML SOPN Inject 60 Units into the skin at bedtime.  . Turmeric 500 MG CAPS Take 1 capsule by mouth daily.   . [DISCONTINUED] glucose blood (FREESTYLE LITE) test strip Use as instructed 4 x daily. E11.65   No facility-administered encounter medications on file as of 07/23/2017.    ALLERGIES: Allergies  Allergen Reactions  . Flagyl [Metronidazole Hcl] Itching and Rash  . Nsaids     nausea   VACCINATION STATUS:  There is no immunization history on file for this patient.  Diabetes  She presents for her follow-up diabetic visit. She has type 2 diabetes mellitus. Onset time: She was diagnosed approximately at age 76 years. Her disease course has been improving. There are no hypoglycemic associated symptoms. Pertinent negatives for hypoglycemia include no confusion, headaches, pallor or seizures. Associated symptoms include fatigue. Pertinent negatives for diabetes include no chest pain, no polydipsia, no polyphagia and no polyuria. There are no hypoglycemic complications. Symptoms are improving. There are no diabetic complications. Risk factors for coronary artery disease include diabetes mellitus, dyslipidemia, hypertension, obesity, sedentary lifestyle and family history. Current diabetic treatment includes oral agent (monotherapy) and intensive insulin program (She is on Tresiba 50 units  subcutaneously daily at bedtime, metformin 1000 g by mouth twice a day. ). Her weight is stable. She is  following a generally unhealthy diet. When asked about meal planning, she reported none. She has not had a previous visit with a dietitian. She never participates in exercise. Her home blood glucose trend is fluctuating minimally. Her breakfast blood glucose range is generally 140-180 mg/dl. Her dinner blood glucose range is generally 180-200 mg/dl. Her overall blood glucose range is 180-200 mg/dl. An ACE inhibitor/angiotensin II receptor blocker is not being taken. Eye exam is current.  Hypertension  This is a chronic problem. The current episode started more than 1 year ago. The problem is controlled. Pertinent negatives include no chest pain, headaches, palpitations or shortness of breath. Risk factors for coronary artery disease include diabetes mellitus, obesity and sedentary lifestyle. Past treatments include beta blockers. Compliance problems include diet and exercise.   Hyperlipidemia  Pertinent  negatives include no chest pain, myalgias or shortness of breath.    Review of Systems  Constitutional: Positive for fatigue. Negative for unexpected weight change.  HENT: Negative for trouble swallowing and voice change.   Eyes: Negative for visual disturbance.  Respiratory: Negative for cough, shortness of breath and wheezing.   Cardiovascular: Negative for chest pain, palpitations and leg swelling.  Gastrointestinal: Negative for diarrhea, nausea and vomiting.  Endocrine: Negative for cold intolerance, heat intolerance, polydipsia, polyphagia and polyuria.  Genitourinary: Negative for dysuria, flank pain and frequency.  Musculoskeletal: Negative for arthralgias and myalgias.  Skin: Negative for color change, pallor, rash and wound.  Neurological: Negative for seizures and headaches.  Psychiatric/Behavioral: Negative for confusion and suicidal ideas.    Objective:    BP 116/78   Pulse 62   Ht 5' 2"  (1.575 m)   Wt 186 lb (84.4 kg)   BMI 34.02 kg/m   Wt Readings from Last 3 Encounters:   07/23/17 186 lb (84.4 kg)  05/28/17 190 lb (86.2 kg)  05/25/17 190 lb (86.2 kg)    Physical Exam  Constitutional: She is oriented to person, place, and time. She appears well-developed.  Obese.  HENT:  Head: Normocephalic and atraumatic.  Eyes: EOM are normal.  Neck: Normal range of motion. Neck supple. No tracheal deviation present. No thyromegaly present.  Cardiovascular: Normal rate and regular rhythm.   Pulmonary/Chest: Effort normal and breath sounds normal.  Abdominal: Soft. Bowel sounds are normal. There is no tenderness. There is no guarding.  Musculoskeletal: Normal range of motion. She exhibits no edema.  Neurological: She is alert and oriented to person, place, and time. She has normal reflexes. No cranial nerve deficit. Coordination normal.  Skin: Skin is warm and dry. No rash noted. No erythema. No pallor.  Psychiatric: She has a normal mood and affect. Judgment normal.     CMP     Component Value Date/Time   NA 140 04/15/2017 1044   K 4.2 04/15/2017 1044   CL 106 04/15/2017 1044   CO2 22 04/15/2017 1044   GLUCOSE 134 (H) 04/15/2017 1044   BUN 14 04/15/2017 1044   CREATININE 0.68 04/15/2017 1044   CALCIUM 9.2 04/15/2017 1044   PROT 7.2 04/15/2017 1044   ALBUMIN 3.6 04/15/2017 1044   AST 41 (H) 04/15/2017 1044   ALT 48 (H) 04/15/2017 1044   ALKPHOS 115 04/15/2017 1044   BILITOT 0.5 04/15/2017 1044   GFRNONAA >60 03/18/2017 1014   GFRNONAA >89 06/30/2016 1251   GFRAA >60 03/18/2017 1014   GFRAA >89 06/30/2016 1251     Diabetic Labs (most recent): Lab Results  Component Value Date   HGBA1C 8.7 (H) 07/20/2017   HGBA1C 9.1 (H) 04/15/2017   HGBA1C 8.5 (H) 12/31/2016     Assessment & Plan:   1. Uncontrolled type 2 diabetes mellitus with complication, with long-term current use of insulin ([place]) - Patient has currently uncontrolled symptomatic type 2 DM since  56 years of age. - Since her last appointment, she documented near target blood glucose  profile, A1c improving to 8.7% from 9.1%.   Recent labs reviewed.   Her diabetes is complicated by obesity, insulin resistance, and patient remains at a high risk for more acute and chronic complications of diabetes which include CAD, CVA, CKD, retinopathy, and neuropathy. These are all discussed in detail with the patient.  - I have counseled the patient on diet management and weight loss, by adopting a carbohydrate restricted/protein rich diet.  -  Suggestion is made for her to avoid simple carbohydrates  from her diet including Cakes, Sweet Desserts / Pastries, Ice Cream, Soda (diet and regular), Sweet Tea, Candies, Chips, Cookies, Store Bought Juices, Alcohol in Excess of  1-2 drinks a day, Artificial Sweeteners, and "Sugar-free" Products. This will help patient to have stable blood glucose profile and potentially avoid unintended weight gain.   - I encouraged the patient to switch to  unprocessed or minimally processed complex starch and increased protein intake (animal or plant source), fruits, and vegetables.  - Patient is advised to stick to a routine mealtimes to eat 3 meals  a day and avoid unnecessary snacks ( to snack only to correct hypoglycemia).  - She declines referral to CDE for now.  - I have approached patient with the following individualized plan to manage diabetes and patient agrees:   -  Based on her glycemic response, she would not require prandial insulin for now.   - I advised her to increase her Tresiba to 60 units  daily at bedtime, associated with strict monitoring of blood glucose 2 times a day-before breakfast and at bedtime.   -  (she  previously took 130 units of basal insulin and 45 units of prandial insulin daily). - Continue to hold prandial insulin for now. - She will continue to monitor blood glucose at least 2 times a day-daily before breakfast and at bedtime.  -Patient is encouraged to call clinic for blood glucose levels less than 70 or above 200 mg  /dl. - I will continue metformin  1000 mg by mouth twice a day ,therapeutically suitable for patient. -   her insurance declines to cover for continuous glucose monitoring device.   -  She has history of intolerance for [place], likely would not tolerate other  incretin therapy.  - Patient specific target  A1c;  LDL, HDL, Triglycerides, and  Waist Circumference were discussed in detail.  2) BP/HTN:  controlled. I advised her to continue her current medications.  3) Lipids/HPL:   Uncontrolled, LDL improving to 119 from 141, I  advised her to continue atorvastatin 20 mg by mouth daily at bedtime. Side effects and precautions discussed with her.  4)  Weight/Diet: CDE Consult has been initiated, however she didn't show up , she has lost 2 pounds since last visit, exercise, and detailed carbohydrates information provided.  5) Chronic Care/Health Maintenance:  -Patient  will be considered for ACE inhibitor's and statins as appropriate, and encouraged to continue to follow up with Ophthalmology, Podiatrist at least yearly or according to recommendations, and advised to   stay away from smoking. I have recommended yearly flu vaccine and pneumonia vaccination at least every 5 years; moderate intensity exercise for up to 150 minutes weekly; and  sleep for at least 7 hours a day.  - Time spent with the patient: 25 min, of which >50% was spent in reviewing her sugar logs , discussing her hypo- and hyper-glycemic episodes, reviewing her current and  previous labs and insulin doses and developing a plan to avoid hypo- and hyper-glycemia.     - I advised patient to maintain close follow up with Glenda Chroman, MD for primary care needs.  Follow up plan: - Return in about 3 months (around 10/23/2017) for follow up with pre-visit labs, meter, and logs.  Glade Lloyd, MD Phone: [phone]  Fax: [phone]  This note was partially dictated with voice recognition software. Similar sounding words can be  transcribed inadequately or may not  be corrected upon review.  07/23/2017, 12:21 PM

## 2017-07-23 NOTE — Patient Instructions (Signed)

## 2017-07-24 ENCOUNTER — Telehealth: Payer: Self-pay | Admitting: "Endocrinology

## 2017-07-24 ENCOUNTER — Encounter: Payer: Self-pay | Admitting: "Endocrinology

## 2017-07-24 MED ORDER — TRESIBA FLEXTOUCH 200 UNIT/ML ~~LOC~~ SOPN
60.0000 [IU] | PEN_INJECTOR | Freq: Every day | SUBCUTANEOUS | 2 refills | Status: DC
Start: 2017-07-24 — End: 2017-11-16

## 2017-07-24 NOTE — Telephone Encounter (Signed)
[name] is stating that she needs a new Rx for [name] called in with the new dosing, please advise?

## 2017-07-29 ENCOUNTER — Ambulatory Visit
Admission: RE | Admit: 2017-07-29 | Discharge: 2017-07-29 | Disposition: A | Discharge status: Home / Self Care | Payer: Medicare Other | Source: Ambulatory Visit | Attending: Neurology | Admitting: Neurology

## 2017-07-29 ENCOUNTER — Other Ambulatory Visit: Payer: Self-pay | Admitting: Neurology

## 2017-07-29 ENCOUNTER — Other Ambulatory Visit: Payer: Self-pay | Admitting: "Endocrinology

## 2017-07-29 DIAGNOSIS — M545 Low back pain: Principal | ICD-10-CM

## 2017-07-29 DIAGNOSIS — G8929 Other chronic pain: Secondary | ICD-10-CM

## 2017-07-29 MED ORDER — IOPAMIDOL (ISOVUE-M 200) INJECTION 41%
1.0000 mL | Freq: Once | INTRAMUSCULAR | Status: AC
  Administered 2017-07-29: 1 mL via INTRA_ARTICULAR

## 2017-07-29 MED ORDER — METHYLPREDNISOLONE ACETATE 40 MG/ML INJ SUSP (RADIOLOG
120.0000 mg | Freq: Once | INTRAMUSCULAR | Status: AC
  Administered 2017-07-29: 120 mg via INTRA_ARTICULAR

## 2017-07-29 NOTE — Discharge Instructions (Signed)

## 2017-07-31 ENCOUNTER — Telehealth: Payer: Self-pay | Admitting: "Endocrinology

## 2017-07-31 NOTE — Telephone Encounter (Signed)
No change, advise for her to start monitoring 4 times a day- Before meals and at bed time, call back if >200 x3.

## 2017-07-31 NOTE — Telephone Encounter (Signed)
[name] is calling stating that her Sugar is running high  Tues 10/23 AM- 139 PM- 18  Wed 10/24 AM-68 PM-286 *Had an epidural Injection for her back pain this day  Thurs 10/25 AM-275 PM-405  Has not checked it yet today, she is c/o headache, please advise?

## 2017-07-31 NOTE — Telephone Encounter (Signed)
Pt.notified

## 2017-08-03 DIAGNOSIS — Z1231 Encounter for screening mammogram for malignant neoplasm of breast: Secondary | ICD-10-CM | POA: Diagnosis not present

## 2017-08-07 DIAGNOSIS — Z23 Encounter for immunization: Secondary | ICD-10-CM | POA: Diagnosis not present

## 2017-08-10 DIAGNOSIS — M84375A Stress fracture, left foot, initial encounter for fracture: Secondary | ICD-10-CM | POA: Diagnosis not present

## 2017-08-10 DIAGNOSIS — M79675 Pain in left toe(s): Secondary | ICD-10-CM | POA: Diagnosis not present

## 2017-08-12 ENCOUNTER — Encounter (INDEPENDENT_AMBULATORY_CARE_PROVIDER_SITE_OTHER): Payer: Self-pay | Admitting: Internal Medicine

## 2017-08-13 DIAGNOSIS — E11319 Type 2 diabetes mellitus with unspecified diabetic retinopathy without macular edema: Secondary | ICD-10-CM | POA: Diagnosis not present

## 2017-08-18 ENCOUNTER — Encounter (INDEPENDENT_AMBULATORY_CARE_PROVIDER_SITE_OTHER): Payer: Self-pay | Admitting: Internal Medicine

## 2017-08-18 ENCOUNTER — Ambulatory Visit (INDEPENDENT_AMBULATORY_CARE_PROVIDER_SITE_OTHER): Payer: Medicare Other | Admitting: Internal Medicine

## 2017-08-18 VITALS — BP 140/82 | HR 72 | Temp 97.5°F | Resp 18 | Ht 62.0 in | Wt 183.6 lb

## 2017-08-18 DIAGNOSIS — K219 Gastro-esophageal reflux disease without esophagitis: Secondary | ICD-10-CM | POA: Diagnosis not present

## 2017-08-18 DIAGNOSIS — K589 Irritable bowel syndrome without diarrhea: Secondary | ICD-10-CM

## 2017-08-18 DIAGNOSIS — K760 Fatty (change of) liver, not elsewhere classified: Secondary | ICD-10-CM | POA: Diagnosis not present

## 2017-08-18 MED ORDER — SIMETHICONE 180 MG PO CAPS: 180.0000 mg | ORAL_CAPSULE | Freq: Two times a day (BID) | ORAL | 0 refills | Status: DC | PRN

## 2017-08-18 MED ORDER — METOCLOPRAMIDE HCL 10 MG PO TABS: 10.0000 mg | ORAL_TABLET | Freq: Every day | ORAL | 0 refills | Status: DC

## 2017-08-18 NOTE — Patient Instructions (Signed)
Stop metoclopramide if you experience any side effects and call office.

## 2017-08-18 NOTE — Progress Notes (Signed)
Presenting complaint;  Follow-up for GERD irregular bowel movements and fatty liver disease.  Database and subjective:  Patient is a 56 year old Caucasian female who is here for scheduled visit.  She was last seen on 02/10/2017.  Following that visit she underwent EGD and co colonoscopy in June 2018. EGD revealed mild portal gastropathy.  Gastric biopsy was negative for H. pylori infection. Colonoscopy revealed 2 small polyps and these are tubular adenomas sigmoid diverticulosis and external hemorrhoids.  Random biopsy from sigmoid colon was negative for microscopic colitis.  Patient states she is feeling better.  She is having much less spells of vomiting.  She has not vomited in over a month.  She remains with frequent nausea.  She has at least 2-3 episodes of regurgitation and they all occur at night.  She has occasional heartburn with certain foods.  She remains with spells of diarrhea and/or constipation.  When she has diarrhea she may have as many as 7-8 stools per day.  She denies melena or rectal bleeding.  She states she walks at least a mile every day.  She has been going to the wife previously but not anymore but she is planning to resume her activity at [facility].  She states her A1c has decreased from 8.7-8.2.  She takes Bentyl 20 mg before breakfast and 10 mg before lunch and or evening meal.  She is not having any side effects.  She has lost 5 pounds since her last visit.  She states earlier this year she 200 pounds.    Current Medications: Outpatient Encounter Medications as of 08/18/2017  Medication Sig  . acetaZOLAMIDE (DIAMOX) 250 MG tablet Take 250 mg by mouth daily.   Marland Kitchen acyclovir (ZOVIRAX) 400 MG tablet Take 1 tablet by mouth 3 (three) times daily as needed (fever blisters).   Marland Kitchen atorvastatin (LIPITOR) 20 MG tablet TAKE ONE TABLET BY MOUTH DAILY.  Marland Kitchen Blood Glucose Monitoring Suppl (FREESTYLE LITE) DEVI Used to monitor blood glucose.  . Cholecalciferol (VITAMIN D3 PO) Take 4,000 Units  by mouth daily.   . Continuous Blood Gluc Sensor (FREESTYLE LIBRE SENSOR SYSTEM) MISC Use one sensor every 10 days.  . cyanocobalamin (,VITAMIN B-12,) 1000 MCG/ML injection Inject 1,000 mcg into the muscle every 30 (thirty) days.  . diazepam (VALIUM) 2 MG tablet Take 2 mg by mouth every 12 (twelve) hours as needed for anxiety.   . dicyclomine (BENTYL) 10 MG capsule TAKE ONE CAPSULE BY MOUTH THREE TIMES DAILY AS NEEDED.  . DULoxetine (CYMBALTA) 60 MG capsule Take 60 mg by mouth daily.  Marland Kitchen estradiol (ESTRACE) 0.5 MG tablet Take 0.5 mg by mouth daily.   . ferrous sulfate 325 (65 FE) MG tablet Take 1 tablet (325 mg total) by mouth daily. (Patient taking differently: Take 325 mg by mouth every other day. )  . fluticasone (FLONASE) 50 MCG/ACT nasal spray Place 2 sprays into both nostrils every morning.   Marland Kitchen glucose blood (FREESTYLE LITE) test strip Use as instructed  . metFORMIN (GLUCOPHAGE) 1000 MG tablet TAKE ONE TABLET BY MOUTH TWICE DAILY WITH A MEAL.  . metoprolol (LOPRESSOR) 50 MG tablet Take 25 mg by mouth daily. Patient takes 1/2 tablet daily  . ondansetron (ZOFRAN) 4 MG tablet Take 1 tablet (4 mg total) by mouth every 8 (eight) hours as needed for nausea or vomiting.  . pantoprazole (PROTONIX) 40 MG tablet TAKE ONE TABLET BY MOUTH DAILY (STOP OMEPRAZOLE)  . PARoxetine (PAXIL) 10 MG tablet daily.  Marland Kitchen PRODIGY TWIST TOP LANCETS 28G MISC  TEST TWICE DAILY  . tiZANidine (ZANAFLEX) 4 MG capsule Take 4 mg as needed by mouth for muscle spasms.  . traMADol (ULTRAM) 50 MG tablet Take 50 mg by mouth every 8 (eight) hours as needed.   . TRESIBA FLEXTOUCH 200 UNIT/ML SOPN Inject 60 Units into the skin at bedtime.  . Turmeric 500 MG CAPS Take 1 capsule by mouth daily.   . Multiple Vitamins-Minerals (WOMENS MULTI PO) Take 1 tablet by mouth daily.   . [DISCONTINUED] ibuprofen (ADVIL,MOTRIN) 600 MG tablet Take 1 tablet (600 mg total) by mouth every 6 (six) hours as needed. (Patient not taking: Reported on  08/18/2017)   No facility-administered encounter medications on file as of 08/18/2017.      Objective: Blood pressure 140/82, pulse 72, temperature (!) 97.5 F (36.4 C), temperature source Oral, resp. rate 18, height 5' 2"  (1.575 m), weight 183 lb 9.6 oz (83.3 kg). Patient is alert and in no acute distress. Conjunctiva is pink. Sclera is nonicteric Oropharyngeal mucosa is normal. No neck masses or thyromegaly noted. Cardiac exam with regular rhythm normal S1 and S2. No murmur or gallop noted. Lungs are clear to auscultation. Abdomen full.  Bowel sounds are normal.  She has mild tenderness at LLQ and epigastrium.  No organomegaly or masses. No LE edema or clubbing noted.  Labs/studies Results: LFTs from 04/15/2017 Bilirubin 0.5, AP 115, AST 41, ALT 48 and albumin 3.6.  From calcium 9.2.   Assessment:  #1.  Fatty liver disease.  By imaging criteria and she has cirrhosis.  However hepatic function is well preserved and she does not have any stigmata of portal hypertension. The 5 months ago revealed mild portal gastropathy but no evidence of esophageal or gastric varices.  To improve her long-term prognosis she must remain physically very active and try to lose weight and improve glycemic control.  #2.  Irritable bowel syndrome.  She has more diarrhea than constipation.  She is getting some relief with dicyclomine.  She had colonoscopy in June this year and biopsies were negative for microscopic colitis.  Her biopsy revealed focal ulceration but endoscopically there was none.  There are tubular adenomas. Screening for celiac disease has been negative.  #3.  GERD.  She remains with frequent regurgitation at night as well as nausea.  She may benefit from low-dose promotility agent as GERD symptoms are not well controlled with PPI and dietary measures.     Plan:  Encouraged to walk and exercise regularly for minimum of 2-1/2 hours/week and ideally 4 hours. Metoclopramide 10 mg p.o.  Nightly. Continue pantoprazole and dicyclomine as before. He is on 180 mg p.o. twice daily as needed for bloating. Patient will call office with progress report in 4 weeks. If she does well with the metoclopramide prescription would be renewed. First visit in 6 months.

## 2017-08-30 ENCOUNTER — Encounter (INDEPENDENT_AMBULATORY_CARE_PROVIDER_SITE_OTHER): Payer: Self-pay | Admitting: Internal Medicine

## 2017-09-10 DIAGNOSIS — M5116 Intervertebral disc disorders with radiculopathy, lumbar region: Secondary | ICD-10-CM | POA: Diagnosis not present

## 2017-09-10 DIAGNOSIS — I10 Essential (primary) hypertension: Secondary | ICD-10-CM | POA: Diagnosis not present

## 2017-09-10 DIAGNOSIS — M545 Low back pain: Secondary | ICD-10-CM | POA: Diagnosis not present

## 2017-09-10 DIAGNOSIS — M461 Sacroiliitis, not elsewhere classified: Secondary | ICD-10-CM | POA: Diagnosis not present

## 2017-09-10 DIAGNOSIS — Z79891 Long term (current) use of opiate analgesic: Secondary | ICD-10-CM | POA: Diagnosis not present

## 2017-09-10 DIAGNOSIS — M5415 Radiculopathy, thoracolumbar region: Secondary | ICD-10-CM | POA: Diagnosis not present

## 2017-09-16 DIAGNOSIS — M159 Polyosteoarthritis, unspecified: Secondary | ICD-10-CM | POA: Diagnosis not present

## 2017-09-16 DIAGNOSIS — I10 Essential (primary) hypertension: Secondary | ICD-10-CM | POA: Diagnosis not present

## 2017-09-16 DIAGNOSIS — E119 Type 2 diabetes mellitus without complications: Secondary | ICD-10-CM | POA: Diagnosis not present

## 2017-09-18 ENCOUNTER — Other Ambulatory Visit (INDEPENDENT_AMBULATORY_CARE_PROVIDER_SITE_OTHER): Payer: Self-pay | Admitting: Internal Medicine

## 2017-09-18 ENCOUNTER — Other Ambulatory Visit: Payer: Self-pay | Admitting: "Endocrinology

## 2017-09-21 ENCOUNTER — Other Ambulatory Visit: Payer: Self-pay | Admitting: Neurology

## 2017-09-21 DIAGNOSIS — M545 Low back pain: Principal | ICD-10-CM

## 2017-09-21 DIAGNOSIS — G8929 Other chronic pain: Secondary | ICD-10-CM

## 2017-09-24 ENCOUNTER — Other Ambulatory Visit: Payer: Self-pay | Admitting: Neurology

## 2017-09-24 ENCOUNTER — Ambulatory Visit
Admission: RE | Admit: 2017-09-24 | Discharge: 2017-09-24 | Disposition: A | Discharge status: Home / Self Care | Payer: Medicare Other | Source: Ambulatory Visit | Attending: Neurology | Admitting: Neurology

## 2017-09-24 DIAGNOSIS — G8929 Other chronic pain: Secondary | ICD-10-CM

## 2017-09-24 DIAGNOSIS — M545 Low back pain: Secondary | ICD-10-CM | POA: Diagnosis not present

## 2017-09-24 MED ORDER — METHYLPREDNISOLONE ACETATE 40 MG/ML INJ SUSP (RADIOLOG
120.0000 mg | Freq: Once | INTRAMUSCULAR | Status: AC
  Administered 2017-09-24: 120 mg via INTRA_ARTICULAR

## 2017-09-24 MED ORDER — IOPAMIDOL (ISOVUE-M 200) INJECTION 41%
1.0000 mL | Freq: Once | INTRAMUSCULAR | Status: AC
  Administered 2017-09-24: 1 mL via INTRA_ARTICULAR

## 2017-09-24 NOTE — Discharge Instructions (Signed)

## 2017-10-08 DIAGNOSIS — M5415 Radiculopathy, thoracolumbar region: Secondary | ICD-10-CM | POA: Diagnosis not present

## 2017-10-08 DIAGNOSIS — Z79891 Long term (current) use of opiate analgesic: Secondary | ICD-10-CM | POA: Diagnosis not present

## 2017-10-08 DIAGNOSIS — M461 Sacroiliitis, not elsewhere classified: Secondary | ICD-10-CM | POA: Diagnosis not present

## 2017-10-08 DIAGNOSIS — M5416 Radiculopathy, lumbar region: Secondary | ICD-10-CM | POA: Diagnosis not present

## 2017-10-08 DIAGNOSIS — M545 Low back pain: Secondary | ICD-10-CM | POA: Diagnosis not present

## 2017-10-09 DIAGNOSIS — Z6833 Body mass index (BMI) 33.0-33.9, adult: Secondary | ICD-10-CM | POA: Diagnosis not present

## 2017-10-09 DIAGNOSIS — Z789 Other specified health status: Secondary | ICD-10-CM | POA: Diagnosis not present

## 2017-10-09 DIAGNOSIS — Z299 Encounter for prophylactic measures, unspecified: Secondary | ICD-10-CM | POA: Diagnosis not present

## 2017-10-09 DIAGNOSIS — R109 Unspecified abdominal pain: Secondary | ICD-10-CM | POA: Diagnosis not present

## 2017-10-09 DIAGNOSIS — M25572 Pain in left ankle and joints of left foot: Secondary | ICD-10-CM | POA: Diagnosis not present

## 2017-10-09 DIAGNOSIS — I10 Essential (primary) hypertension: Secondary | ICD-10-CM | POA: Diagnosis not present

## 2017-10-09 DIAGNOSIS — F329 Major depressive disorder, single episode, unspecified: Secondary | ICD-10-CM | POA: Diagnosis not present

## 2017-10-13 ENCOUNTER — Emergency Department (HOSPITAL_COMMUNITY): Payer: Medicare Other

## 2017-10-13 ENCOUNTER — Encounter (HOSPITAL_COMMUNITY): Payer: Self-pay | Admitting: Emergency Medicine

## 2017-10-13 ENCOUNTER — Emergency Department (HOSPITAL_COMMUNITY)
Admission: EM | Admit: 2017-10-13 | Discharge: 2017-10-13 | Disposition: A | Discharge status: Home / Self Care | Payer: Medicare Other | Attending: Emergency Medicine | Admitting: Emergency Medicine

## 2017-10-13 DIAGNOSIS — Z79899 Other long term (current) drug therapy: Secondary | ICD-10-CM | POA: Diagnosis not present

## 2017-10-13 DIAGNOSIS — E7800 Pure hypercholesterolemia, unspecified: Secondary | ICD-10-CM | POA: Insufficient documentation

## 2017-10-13 DIAGNOSIS — I10 Essential (primary) hypertension: Secondary | ICD-10-CM | POA: Insufficient documentation

## 2017-10-13 DIAGNOSIS — E1140 Type 2 diabetes mellitus with diabetic neuropathy, unspecified: Secondary | ICD-10-CM | POA: Diagnosis not present

## 2017-10-13 DIAGNOSIS — K439 Ventral hernia without obstruction or gangrene: Secondary | ICD-10-CM | POA: Diagnosis not present

## 2017-10-13 DIAGNOSIS — Z7984 Long term (current) use of oral hypoglycemic drugs: Secondary | ICD-10-CM | POA: Insufficient documentation

## 2017-10-13 DIAGNOSIS — R197 Diarrhea, unspecified: Secondary | ICD-10-CM | POA: Diagnosis not present

## 2017-10-13 DIAGNOSIS — E118 Type 2 diabetes mellitus with unspecified complications: Secondary | ICD-10-CM | POA: Diagnosis not present

## 2017-10-13 DIAGNOSIS — E782 Mixed hyperlipidemia: Secondary | ICD-10-CM | POA: Insufficient documentation

## 2017-10-13 DIAGNOSIS — R109 Unspecified abdominal pain: Secondary | ICD-10-CM | POA: Diagnosis not present

## 2017-10-13 DIAGNOSIS — Z794 Long term (current) use of insulin: Secondary | ICD-10-CM | POA: Diagnosis not present

## 2017-10-13 DIAGNOSIS — E1165 Type 2 diabetes mellitus with hyperglycemia: Secondary | ICD-10-CM | POA: Diagnosis not present

## 2017-10-13 DIAGNOSIS — R112 Nausea with vomiting, unspecified: Secondary | ICD-10-CM | POA: Diagnosis not present

## 2017-10-13 LAB — COMPREHENSIVE METABOLIC PANEL
ALT: 24 U/L (ref 14–54)
AST: 31 U/L (ref 15–41)
Albumin: 3.4 g/dL — ABNORMAL LOW (ref 3.5–5.0)
Alkaline Phosphatase: 86 U/L (ref 38–126)
Anion gap: 10 (ref 5–15)
BUN: 11 mg/dL (ref 6–20)
CO2: 21 mmol/L — ABNORMAL LOW (ref 22–32)
Calcium: 9.1 mg/dL (ref 8.9–10.3)
Chloride: 107 mmol/L (ref 101–111)
Creatinine, Ser: 0.68 mg/dL (ref 0.44–1.00)
GFR calc Af Amer: >60 mL/min (ref >60)
GFR calc non Af Amer: >60 mL/min (ref >60)
Glucose, Bld: 133 mg/dL — ABNORMAL HIGH (ref 65–99)
Potassium: 3.9 mmol/L (ref 3.5–5.1)
Sodium: 138 mmol/L (ref 135–145)
Total Bilirubin: 0.3 mg/dL (ref 0.3–1.2)
Total Protein: 7.2 g/dL (ref 6.5–8.1)

## 2017-10-13 LAB — CBC WITH DIFFERENTIAL/PLATELET
Basophils Absolute: 0 10*3/uL (ref 0.0–0.1)
Basophils Relative: 0 %
Eosinophils Absolute: 0.2 10*3/uL (ref 0.0–0.7)
Eosinophils Relative: 3 %
HCT: 39.5 % (ref 36.0–46.0)
Hemoglobin: 12.8 g/dL (ref 12.0–15.0)
Lymphocytes Relative: 42 %
Lymphs Abs: 3.4 10*3/uL (ref 0.7–4.0)
MCH: 30.5 pg (ref 26.0–34.0)
MCHC: 32.4 g/dL (ref 30.0–36.0)
MCV: 94 fL (ref 78.0–100.0)
Monocytes Absolute: 0.5 10*3/uL (ref 0.1–1.0)
Monocytes Relative: 7 %
Neutro Abs: 3.9 10*3/uL (ref 1.7–7.7)
Neutrophils Relative %: 48 %
Platelets: 188 10*3/uL (ref 150–400)
RBC: 4.2 MIL/uL (ref 3.87–5.11)
RDW: 14.1 % (ref 11.5–15.5)
WBC: 8 10*3/uL (ref 4.0–10.5)

## 2017-10-13 LAB — URINALYSIS, ROUTINE W REFLEX MICROSCOPIC
Bilirubin Urine: NEGATIVE
Glucose, UA: NEGATIVE mg/dL
Hgb urine dipstick: NEGATIVE
Ketones, ur: NEGATIVE mg/dL
Leukocytes, UA: NEGATIVE
Nitrite: NEGATIVE
Protein, ur: NEGATIVE mg/dL
Specific Gravity, Urine: >1.046 — ABNORMAL HIGH (ref 1.005–1.030)
pH: 5 (ref 5.0–8.0)

## 2017-10-13 MED ORDER — IOPAMIDOL (ISOVUE-300) INJECTION 61%
100.0000 mL | Freq: Once | INTRAVENOUS | Status: AC | PRN
  Administered 2017-10-13: 100 mL via INTRAVENOUS

## 2017-10-13 MED ORDER — PROMETHAZINE HCL 25 MG/ML IJ SOLN
12.5000 mg | Freq: Once | INTRAMUSCULAR | Status: AC
  Administered 2017-10-13: 12.5 mg via INTRAVENOUS
  Filled 2017-10-13: qty 1

## 2017-10-13 MED ORDER — ONDANSETRON HCL 4 MG/2ML IJ SOLN
4.0000 mg | Freq: Once | INTRAMUSCULAR | Status: AC
  Administered 2017-10-13: 4 mg via INTRAVENOUS
  Filled 2017-10-13: qty 2

## 2017-10-13 MED ORDER — DICYCLOMINE HCL 20 MG PO TABS: 20.0000 mg | ORAL_TABLET | Freq: Three times a day (TID) | ORAL | 0 refills | Status: DC | PRN

## 2017-10-13 MED ORDER — HYDROMORPHONE HCL 1 MG/ML IJ SOLN
1.0000 mg | Freq: Once | INTRAMUSCULAR | Status: AC
  Administered 2017-10-13: 1 mg via INTRAVENOUS
  Filled 2017-10-13: qty 1

## 2017-10-13 MED ORDER — SODIUM CHLORIDE 0.9 % IV SOLN
INTRAVENOUS | Status: DC
  Administered 2017-10-13: 11:00:00 via INTRAVENOUS

## 2017-10-13 MED ORDER — MORPHINE SULFATE (PF) 4 MG/ML IV SOLN
4.0000 mg | Freq: Once | INTRAVENOUS | Status: AC
  Administered 2017-10-13: 4 mg via INTRAVENOUS
  Filled 2017-10-13: qty 1

## 2017-10-13 NOTE — ED Provider Notes (Signed)
[facility] EMERGENCY DEPARTMENT Provider Note   CSN: [id number] Arrival date & time: 10/13/17  0900     History   Chief Complaint Chief Complaint  Patient presents with  . Abdominal Pain    HPI [name] is a 57 y.o. female.  HPI   57 year old female with abdominal pain.  Associated nausea, vomiting.  She reports that symptoms have been ongoing since before Christmas.  She has a past history of IBS and frequently has abdominal pain and diarrhea, but states that current symptoms of felt different.  No fevers or chills.  No urinary complaints.  S/p hysterectomy and cholecystectomy.  Past Medical History:  Diagnosis Date  . Anxiety disorder   . Chronic low back pain   . Cirrhosis of liver ([place])   . Colitis   . Diabetes mellitus   . Diabetic neuropathy ([place])   . Diverticulitis   . Fatty liver   . GERD (gastroesophageal reflux disease)   . High cholesterol   . Hypertension   . Irritable bowel syndrome   . Meniere's disease   . Neuropathy   . Pain     Patient Active Problem List   Diagnosis Date Noted  . Mixed hyperlipidemia 05/14/2017  . Gastroesophageal reflux disease without esophagitis 02/11/2017  . History of colonic polyps 02/11/2017  . Nausea without vomiting 02/11/2017  . Irritable bowel syndrome 02/11/2017  . Hepatic cirrhosis ([place]) 02/11/2017  . Pain in left foot 01/22/2017  . Pain in right foot 01/22/2017  . Metatarsal stress fracture of left foot 11/06/2016  . Diabetic polyneuropathy associated with type 2 diabetes mellitus ([place]) 11/06/2016  . Hypercholesteremia 07/15/2016  . Class 2 severe obesity due to excess calories with serious comorbidity and body mass index (BMI) of 35.0 to 35.9 in adult ([place]) 04/29/2016  . Nausea and vomiting 01/17/2014  . HTN (hypertension) 10/25/2013  . Fatty liver disease, nonalcoholic 62/13/0865  . IBS (irritable bowel syndrome) 02/24/2012  . Type 2 diabetes mellitus, uncontrolled ([place]) 02/24/2012    Past Surgical  History:  Procedure Laterality Date  . ABDOMINAL HYSTERECTOMY    . BIOPSY  03/20/2017   Procedure: BIOPSY;  Surgeon: [name], MD;  Location: [facility];  Service: Endoscopy;;  colon gastric  . bladder tact  2005  . CHOLECYSTECTOMY  08/11  . COLONOSCOPY  2208  . COLONOSCOPY  In of September 2014   @ [facility]/Dr,.[name]  . COLONOSCOPY WITH PROPOFOL N/A 03/20/2017   Procedure: COLONOSCOPY WITH PROPOFOL;  Surgeon: [name], MD;  Location: [facility];  Service: Endoscopy;  Laterality: N/A;  . ECTOPIC PREGNANCY SURGERY  1996  . ESOPHAGOGASTRODUODENOSCOPY (EGD) WITH PROPOFOL N/A 03/20/2017   Procedure: ESOPHAGOGASTRODUODENOSCOPY (EGD) WITH PROPOFOL;  Surgeon: [name], MD;  Location: [facility];  Service: Endoscopy;  Laterality: N/A;  12:45  . HERNIA REPAIR  09/17/11  . [place]ford  2009   Preformed on the right shoulder  . OVARIAN CYST REMOVAL     right side  . PARTIAL HYSTERECTOMY  2005  . POLYPECTOMY  03/20/2017   Procedure: POLYPECTOMY;  Surgeon: [name], MD;  Location: [facility];  Service: Endoscopy;;  colon  . UPPER GASTROINTESTINAL ENDOSCOPY  2008  . URETERAL STENT PLACEMENT      OB History    No data available       Home Medications    Prior to Admission medications   Medication Sig Start Date End Date Taking? Authorizing Provider  acetaZOLAMIDE (DIAMOX) 250  MG tablet Take 250 mg by mouth daily.  04/28/16  Yes [provider]  acyclovir (ZOVIRAX) 400 MG tablet Take 1 tablet by mouth 3 (three) times daily as needed (fever blisters).  06/25/16  Yes [provider]  atorvastatin (LIPITOR) 20 MG tablet TAKE ONE TABLET BY MOUTH DAILY. 09/18/17  Yes Cassandria Anger, MD  Cholecalciferol (VITAMIN D3 PO) Take 4,000 Units by mouth daily.    Yes [provider]  cyanocobalamin (,VITAMIN B-12,) 1000 MCG/ML injection Inject 1,000 mcg into the muscle every 30 (thirty) days. 04/14/16  Yes [provider]  diazepam  (VALIUM) 2 MG tablet Take 2 mg by mouth every 12 (twelve) hours as needed for anxiety.  04/07/16  Yes [provider]  dicyclomine (BENTYL) 10 MG capsule Take 10 mg by mouth 3 (three) times daily before meals.    Yes [provider]  DULoxetine (CYMBALTA) 60 MG capsule Take 60 mg by mouth daily. 04/28/16  Yes [provider]  estradiol (ESTRACE) 0.5 MG tablet Take 0.5 mg by mouth every evening.  01/27/17  Yes [provider]  ferrous sulfate 325 (65 FE) MG tablet Take 1 tablet (325 mg total) by mouth daily. Patient taking differently: Take 325 mg by mouth every 3 (three) days.  06/26/16  Yes Triplett, Tammy, PA-C  fluticasone (FLONASE) 50 MCG/ACT nasal spray Place 2 sprays into both nostrils every morning.    Yes [provider]  metFORMIN (GLUCOPHAGE) 1000 MG tablet TAKE ONE TABLET BY MOUTH TWICE DAILY WITH A MEAL 09/18/17  Yes Nida, Marella Chimes, MD  metoprolol (LOPRESSOR) 50 MG tablet Take 25 mg by mouth daily. Patient takes 1/2 tablet daily   Yes [provider]  ondansetron (ZOFRAN) 4 MG tablet Take 1 tablet (4 mg total) by mouth every 8 (eight) hours as needed for nausea or vomiting. 09/24/16  Yes Rolland Porter, MD  oxyCODONE-acetaminophen (PERCOCET/ROXICET) 5-325 MG tablet Take 1-2 tablets by mouth daily as needed for moderate pain.  10/08/17  Yes [provider]  pantoprazole (PROTONIX) 40 MG tablet TAKE ONE TABLET BY MOUTH DAILY (STOP OMEPRAZOLE) 01/13/17  Yes [provider]  PARoxetine (PAXIL) 10 MG tablet Take 10 mg by mouth daily.  07/22/17  Yes [provider]  tiZANidine (ZANAFLEX) 4 MG capsule Take 4 mg as needed by mouth for muscle spasms.   Yes [provider]  traMADol (ULTRAM) 50 MG tablet Take 50 mg by mouth every 8 (eight) hours as needed.    Yes [provider]  TRESIBA FLEXTOUCH 200 UNIT/ML SOPN Inject 60 Units into the skin at bedtime. 07/24/17  Yes Nida, Marella Chimes, MD  Turmeric  500 MG CAPS Take 1 capsule by mouth daily.    Yes [provider]  Blood Glucose Monitoring Suppl (FREESTYLE LITE) DEVI Used to monitor blood glucose. 05/28/17   Cassandria Anger, MD  Continuous Blood Gluc Sensor (FREESTYLE LIBRE SENSOR SYSTEM) MISC Use one sensor every 10 days. 05/28/17   Cassandria Anger, MD  dicyclomine (BENTYL) 20 MG tablet Take 1 tablet (20 mg total) by mouth every 8 (eight) hours as needed for spasms. 10/13/17   Virgel Manifold, MD  glucose blood (FREESTYLE LITE) test strip Use as instructed 05/28/17   Cassandria Anger, MD  metoCLOPramide (REGLAN) 10 MG tablet TAKE ONE TABLET BY MOUTH AT BEDTIME Patient not taking: Reported on 10/13/2017 09/21/17   [name], MD  Multiple Vitamins-Minerals (WOMENS MULTI PO) Take 1 tablet by mouth daily.  [provider]  PRODIGY TWIST TOP LANCETS 28G MISC TEST TWICE DAILY 06/25/17   Cassandria Anger, MD    Family History Family History  Problem Relation Age of Onset  . Healthy Mother   . Heart disease Father   . Esophageal cancer Brother   . Healthy Son   . Healthy Son     Social History Social History   Tobacco Use  . Smoking status: Never Smoker  . Smokeless tobacco: Never Used  Substance Use Topics  . Alcohol use: No  . Drug use: No     Allergies   Flagyl [metronidazole hcl] and Nsaids   Review of Systems Review of Systems  All systems reviewed and negative, other than as noted in HPI.  Physical Exam Updated Vital Signs BP (!) 146/80   Pulse 67   Temp 97.9 F (36.6 C) (Oral)   Resp 18   Ht 5' 2"  (1.575 m)   Wt 83.9 kg (185 lb)   SpO2 98%   BMI 33.84 kg/m   Physical Exam  Constitutional: She appears well-developed and well-nourished. No distress.  HENT:  Head: Normocephalic and atraumatic.  Eyes: Conjunctivae are normal. Right eye exhibits no discharge. Left eye exhibits no discharge.  Neck: Neck supple.  Cardiovascular: Normal rate, regular rhythm and normal  heart sounds. Exam reveals no gallop and no friction rub.  No murmur heard. Pulmonary/Chest: Effort normal and breath sounds normal. No respiratory distress.  Abdominal: Soft. She exhibits no distension. There is tenderness.  Mild diffuse tenderness, perhaps somewhat worse than left abdomen.  No rebound or guarding.  Musculoskeletal: She exhibits no edema or tenderness.  Neurological: She is alert.  Skin: Skin is warm and dry.  Psychiatric: She has a normal mood and affect. Her behavior is normal. Thought content normal.  Nursing note and vitals reviewed.    ED Treatments / Results  Labs (all labs ordered are listed, but only abnormal results are displayed) Labs Reviewed  COMPREHENSIVE METABOLIC PANEL - Abnormal; Notable for the following components:      Result Value   CO2 21 (*)    Glucose, Bld 133 (*)    Albumin 3.4 (*)    All other components within normal limits  URINALYSIS, ROUTINE W REFLEX MICROSCOPIC - Abnormal; Notable for the following components:   Specific Gravity, Urine >1.046 (*)    All other components within normal limits  CBC WITH DIFFERENTIAL/PLATELET    EKG  EKG Interpretation None       Radiology Ct Abdomen Pelvis W Contrast  Result Date: 10/13/2017 CLINICAL DATA:  Nausea, vomiting, and diarrhea for approximately 3 weeks EXAM: CT ABDOMEN AND PELVIS WITH CONTRAST TECHNIQUE: Multidetector CT imaging of the abdomen and pelvis was performed using the standard protocol following bolus administration of intravenous contrast. CONTRAST:  120m ISOVUE-300 IOPAMIDOL (ISOVUE-300) INJECTION 61% COMPARISON:  January 10, 2017 FINDINGS: Lower chest: There is no edema or consolidation in the lung bases. There is minimal scarring in the right base. Hepatobiliary: The liver contour is somewhat nodular consistent with underlying cirrhosis. There is again noted recanalization of the umbilical vein. There is no focal liver lesion evident. Gallbladder is absent. There is no  appreciable biliary duct dilatation. Pancreas: There is no evident pancreatic mass or inflammatory focus. There is a degree of fatty replacement in portions of the pancreas. Spleen: No splenic lesions are evident. A small splenule is noted posterior to the spleen, stable. Adrenals/Urinary Tract: Adrenals bilaterally appear normal. There is no evident renal  mass or hydronephrosis on either side. There is no evident renal or ureteral calculus on either side. Urinary bladder is midline with wall thickness within normal limits. Stomach/Bowel: There are splenic diverticula without diverticulitis. There is no evident bowel wall or mesenteric thickening. No evident bowel obstruction. No free air or portal venous air. Vascular/Lymphatic: There is atherosclerotic calcification in the aorta. No aneurysm evident. Major mesenteric vessels appear patent. There is no appreciable adenopathy in the abdomen or pelvis. Reproductive: Uterus is absent.  There is no pelvic mass. Other: There is a stable ventral hernia which contains fat and mild mesenteric/fatty stranding but no bowel. There appears to have been previous attempted repair in this area. No abscess or ascites is evident in the abdomen or pelvis. Appendix is normal in size and contour. No periappendiceal region inflammation evident. Musculoskeletal: There is degenerative change in the lumbar spine. There are no blastic or lytic bone lesions. No appreciable intramuscular or abdominal wall lesions. IMPRESSION: 1. The liver has an appearance consistent with hepatic cirrhosis. Recanalization of the umbilical vein is again noted. No focal liver lesions. Gallbladder is absent. 2. No bowel obstruction. There are sigmoid diverticula without diverticulitis. No abscess. Appendix appears normal. 3. Ventral hernia with evidence of previous attempted repair. No bowel extends into this area. 4.  Aortic atherosclerosis. 5.  No renal or ureteral calculus.  No hydronephrosis. Aortic  Atherosclerosis (ICD10-I70.0). Electronically Signed   By: Lowella Grip III M.D.   On: 10/13/2017 12:25    Procedures Procedures (including critical care time)  Medications Ordered in ED Medications  0.9 %  sodium chloride infusion ( Intravenous New Bag/Given 10/13/17 1031)  ondansetron (ZOFRAN) injection 4 mg (4 mg Intravenous Given 10/13/17 1031)  HYDROmorphone (DILAUDID) injection 1 mg (1 mg Intravenous Given 10/13/17 1031)  iopamidol (ISOVUE-300) 61 % injection 100 mL (100 mLs Intravenous Contrast Given 10/13/17 1202)  morphine 4 MG/ML injection 4 mg (4 mg Intravenous Given 10/13/17 1333)  promethazine (PHENERGAN) injection 12.5 mg (12.5 mg Intravenous Given 10/13/17 1333)     Initial Impression / Assessment and Plan / ED Course  I have reviewed the triage vital signs and the nursing notes.  Pertinent labs & imaging results that were available during my care of the patient were reviewed by me and considered in my medical decision making (see chart for details).     57 year old female with GI complaints including abdominal pain going on for over 2 weeks at this point.  She does have some mild tenderness on exam, but no peritonitis.  Workup fairly unremarkable.  CT of the abdomen and pelvis with no acute pathology which would explain her symptoms.  I doubt emergent process.  Plan continue symptomatic treatment.  Follow-up with her PCP or gastroenterologist otherwise.  Final Clinical Impressions(s) / ED Diagnoses   Final diagnoses:  Abdominal pain, unspecified abdominal location    ED Discharge Orders        Ordered    dicyclomine (BENTYL) 20 MG tablet  Every 8 hours PRN     10/13/17 1349       Virgel Manifold, MD 10/13/17 1354

## 2017-10-13 NOTE — ED Notes (Signed)
Pt ambulated to restroom. Will notify MD about wanting more pain and nausea medication

## 2017-10-13 NOTE — ED Triage Notes (Signed)
Pt reports n/v/d since before Christmas.  States she takes Zofran to relieve nausea.

## 2017-10-14 LAB — MICROALBUMIN / CREATININE URINE RATIO
CREATININE, URINE: 154 mg/dL (ref 20–275)
MICROALB UR: 1.2 mg/dL
Microalb Creat Ratio: 8 mcg/mg creat (ref <30)

## 2017-10-14 LAB — COMPLETE METABOLIC PANEL WITH GFR
AG Ratio: 1.2 (calc) (ref 1.0–2.5)
ALKALINE PHOSPHATASE (APISO): 92 U/L (ref 33–130)
ALT: 19 U/L (ref 6–29)
AST: 23 U/L (ref 10–35)
Albumin: 3.8 g/dL (ref 3.6–5.1)
BILIRUBIN TOTAL: 0.3 mg/dL (ref 0.2–1.2)
BUN: 11 mg/dL (ref 7–25)
CHLORIDE: 107 mmol/L (ref 98–110)
CO2: 27 mmol/L (ref 20–32)
Calcium: 9.1 mg/dL (ref 8.6–10.4)
Creat: 0.71 mg/dL (ref 0.50–1.05)
GFR, Est African American: 110 mL/min/{1.73_m2} (ref >=60)
GFR, Est Non African American: 95 mL/min/{1.73_m2} (ref >=60)
GLUCOSE: 173 mg/dL — AB (ref 65–99)
Globulin: 3.1 g/dL (calc) (ref 1.9–3.7)
Potassium: 4.2 mmol/L (ref 3.5–5.3)
Sodium: 140 mmol/L (ref 135–146)
Total Protein: 6.9 g/dL (ref 6.1–8.1)

## 2017-10-14 LAB — LIPID PANEL
CHOLESTEROL: 146 mg/dL (ref <200)
HDL: 49 mg/dL — AB (ref >50)
LDL CHOLESTEROL (CALC): 80 mg/dL
Non-HDL Cholesterol (Calc): 97 mg/dL (calc) (ref <130)
TRIGLYCERIDES: 90 mg/dL (ref <150)
Total CHOL/HDL Ratio: 3 (calc) (ref <5.0)

## 2017-10-14 LAB — HEMOGLOBIN A1C
HEMOGLOBIN A1C: 9.7 %{Hb} — AB (ref <5.7)
Mean Plasma Glucose: 232 (calc)
eAG (mmol/L): 12.8 (calc)

## 2017-10-19 ENCOUNTER — Ambulatory Visit (INDEPENDENT_AMBULATORY_CARE_PROVIDER_SITE_OTHER): Payer: Medicare Other | Admitting: Otolaryngology

## 2017-10-19 DIAGNOSIS — H8101 Meniere's disease, right ear: Secondary | ICD-10-CM

## 2017-10-19 DIAGNOSIS — H903 Sensorineural hearing loss, bilateral: Secondary | ICD-10-CM

## 2017-10-21 ENCOUNTER — Ambulatory Visit (INDEPENDENT_AMBULATORY_CARE_PROVIDER_SITE_OTHER): Payer: Medicare Other | Admitting: Podiatry

## 2017-10-21 ENCOUNTER — Ambulatory Visit (INDEPENDENT_AMBULATORY_CARE_PROVIDER_SITE_OTHER): Payer: Medicare Other

## 2017-10-21 ENCOUNTER — Encounter: Payer: Self-pay | Admitting: Podiatry

## 2017-10-21 DIAGNOSIS — M659 Synovitis and tenosynovitis, unspecified: Secondary | ICD-10-CM | POA: Diagnosis not present

## 2017-10-21 DIAGNOSIS — S9032XA Contusion of left foot, initial encounter: Secondary | ICD-10-CM | POA: Diagnosis not present

## 2017-10-21 DIAGNOSIS — B351 Tinea unguium: Secondary | ICD-10-CM

## 2017-10-21 DIAGNOSIS — L600 Ingrowing nail: Secondary | ICD-10-CM

## 2017-10-21 MED ORDER — CICLOPIROX 8 % EX SOLN: Freq: Every day | CUTANEOUS | 0 refills | Status: DC

## 2017-10-21 NOTE — Progress Notes (Signed)
Subjective:    Patient ID: [name], female    DOB: March 18, 1961, 57 y.o.   MRN: [id number]  HPI   Chief Complaint  Patient presents with  . Foot Problem    i dropped a drink or jar or can on my left foot    57 y.o. female presents with the above complaint.  States that she dropped a can on her left foot.  Has had pain since for the past 2 weeks.  Pain located on the top of the end of her foot and some radiation into the toes.  Has tried wearing a boot without relief.  Has cirrhosis of the liver and avoid NSAIDs.  Also complains of thickening and discoloration to the left great toenail.  Past Medical History:  Diagnosis Date  . Anxiety disorder   . Chronic low back pain   . Cirrhosis of liver ([place])   . Colitis   . Diabetes mellitus   . Diabetic neuropathy ([place])   . Diverticulitis   . Fatty liver   . GERD (gastroesophageal reflux disease)   . High cholesterol   . Hypertension   . Irritable bowel syndrome   . Meniere's disease   . Neuropathy   . Pain    Past Surgical History:  Procedure Laterality Date  . ABDOMINAL HYSTERECTOMY    . BIOPSY  03/20/2017   Procedure: BIOPSY;  Surgeon: [name], MD;  [facility];  Service: Endoscopy;;  colon gastric  . bladder tact  2005  . CHOLECYSTECTOMY  08/11  . COLONOSCOPY  2208  . COLONOSCOPY  In of September 2014   @ [facility]/Dr,.[name]  . COLONOSCOPY WITH PROPOFOL N/A 03/20/2017   Procedure: COLONOSCOPY WITH PROPOFOL;  Surgeon: [name], MD;  [facility];  Service: Endoscopy;  Laterality: N/A;  . ECTOPIC PREGNANCY SURGERY  1996  . ESOPHAGOGASTRODUODENOSCOPY (EGD) WITH PROPOFOL N/A 03/20/2017   Procedure: ESOPHAGOGASTRODUODENOSCOPY (EGD) WITH PROPOFOL;  Surgeon: [name], MD;  [facility];  Service: Endoscopy;  Laterality: N/A;  12:45  . HERNIA REPAIR  09/17/11  . [place]  2009   Preformed on the right shoulder  . OVARIAN CYST REMOVAL     right side  . PARTIAL HYSTERECTOMY   2005  . POLYPECTOMY  03/20/2017   Procedure: POLYPECTOMY;  Surgeon: [name], MD;  [facility];  Service: Endoscopy;;  colon  . UPPER GASTROINTESTINAL ENDOSCOPY  2008  . URETERAL STENT PLACEMENT      Current Outpatient Medications:  .  acetaZOLAMIDE (DIAMOX) 250 MG tablet, Take 250 mg by mouth daily. , Disp: , Rfl:  .  acyclovir (ZOVIRAX) 400 MG tablet, Take 1 tablet by mouth 3 (three) times daily as needed (fever blisters). , Disp: , Rfl:  .  atorvastatin (LIPITOR) 20 MG tablet, TAKE ONE TABLET BY MOUTH DAILY., Disp: 90 tablet, Rfl: 0 .  Blood Glucose Monitoring Suppl (FREESTYLE LITE) DEVI, Used to monitor blood glucose., Disp: 1 each, Rfl: 0 .  Cholecalciferol (VITAMIN D3 PO), Take 4,000 Units by mouth daily. , Disp: , Rfl:  .  ciclopirox (PENLAC) 8 % solution, Apply topically at bedtime. Apply over nail and surrounding skin. Apply daily over previous coat. Remove weekly with file or polish remover., Disp: 6.6 mL, Rfl: 0 .  Continuous Blood Gluc Sensor ([place]) MISC, Use one sensor every 10 days., Disp: 3 each, Rfl: 2 .  cyanocobalamin (,VITAMIN B-12,) 1000 MCG/ML injection, Inject 1,000  mcg into the muscle every 30 (thirty) days., Disp: , Rfl: 5 .  diazepam (VALIUM) 2 MG tablet, Take 2 mg by mouth every 12 (twelve) hours as needed for anxiety. , Disp: , Rfl:  .  dicyclomine (BENTYL) 10 MG capsule, Take 10 mg by mouth 3 (three) times daily before meals. , Disp: , Rfl:  .  dicyclomine (BENTYL) 20 MG tablet, Take 1 tablet (20 mg total) by mouth every 8 (eight) hours as needed for spasms., Disp: 12 tablet, Rfl: 0 .  DULoxetine (CYMBALTA) 60 MG capsule, Take 60 mg by mouth daily., Disp: , Rfl:  .  estradiol (ESTRACE) 0.5 MG tablet, Take 0.5 mg by mouth every evening. , Disp: , Rfl:  .  ferrous sulfate 325 (65 FE) MG tablet, Take 1 tablet (325 mg total) by mouth daily. (Patient taking differently: Take 325 mg by mouth every 3 (three) days. ), Disp: 30  tablet, Rfl: 0 .  fluticasone (FLONASE) 50 MCG/ACT nasal spray, Place 2 sprays into both nostrils every morning. , Disp: , Rfl:  .  glucose blood (FREESTYLE LITE) test strip, Use as instructed, Disp: 100 each, Rfl: 2 .  metFORMIN (GLUCOPHAGE) 1000 MG tablet, TAKE ONE TABLET BY MOUTH TWICE DAILY WITH A MEAL, Disp: 60 tablet, Rfl: 2 .  metoCLOPramide (REGLAN) 10 MG tablet, TAKE ONE TABLET BY MOUTH AT BEDTIME (Patient not taking: Reported on 10/13/2017), Disp: 30 tablet, Rfl: 2 .  metoprolol (LOPRESSOR) 50 MG tablet, Take 25 mg by mouth daily. Patient takes 1/2 tablet daily, Disp: , Rfl:  .  Multiple Vitamins-Minerals (WOMENS MULTI PO), Take 1 tablet by mouth daily. , Disp: , Rfl:  .  ondansetron (ZOFRAN) 4 MG tablet, Take 1 tablet (4 mg total) by mouth every 8 (eight) hours as needed for nausea or vomiting., Disp: 12 tablet, Rfl: 0 .  oxyCODONE-acetaminophen (PERCOCET/ROXICET) 5-325 MG tablet, Take 1-2 tablets by mouth daily as needed for moderate pain. , Disp: , Rfl: 0 .  pantoprazole (PROTONIX) 40 MG tablet, TAKE ONE TABLET BY MOUTH DAILY (STOP OMEPRAZOLE), Disp: , Rfl: 4 .  PARoxetine (PAXIL) 10 MG tablet, Take 10 mg by mouth daily. , Disp: , Rfl:  .  PRODIGY TWIST TOP LANCETS 28G MISC, TEST TWICE DAILY, Disp: 200 each, Rfl: 2 .  tiZANidine (ZANAFLEX) 4 MG capsule, Take 4 mg as needed by mouth for muscle spasms., Disp: , Rfl:  .  traMADol (ULTRAM) 50 MG tablet, Take 50 mg by mouth every 8 (eight) hours as needed. , Disp: , Rfl:  .  TRESIBA FLEXTOUCH 200 UNIT/ML SOPN, Inject 60 Units into the skin at bedtime., Disp: 6 pen, Rfl: 2 .  Turmeric 500 MG CAPS, Take 1 capsule by mouth daily. , Disp: , Rfl:   Allergies  Allergen Reactions  . Flagyl [Metronidazole Hcl] Itching and Rash  . Nsaids     nausea    Review of Systems all systems reviewed and negative except as noted in HPI     Objective:   Physical Exam There were no vitals filed for this visit. General AA&O x3. Normal mood and affect.    Vascular Dorsalis pedis and posterior tibial pulses  present 2+ bilaterally  Capillary refill normal to all digits. Pedal hair growth normal.  Neurologic Epicritic sensation grossly present.  Dermatologic No open lesions. Interspaces clear of maceration. Left hallux nail with thickening and ingrowing medial aspect  Orthopedic: MMT 5/5 in dorsiflexion, plantarflexion, inversion, and eversion. Pain to palpation left second and third MPJs  with slight edema pain to palpation left medial heel  Radiographs taken reviewed no acute fracture dislocations midfoot DJD noted left first MPJ decreased joint space.    Assessment & Plan:  Patient was evaluated and treated all questions answered  Bony contusion left foot -X-rays reviewed no evidence of fracture or dislocation. -Soft cast consisting of Unna boot Webril and Ace bandage applied -Educated on activity reduction  Onychomycosis -Rx Penlac.  Educated on application

## 2017-10-23 ENCOUNTER — Ambulatory Visit: Payer: Medicare Other | Admitting: "Endocrinology

## 2017-10-23 ENCOUNTER — Encounter: Payer: Self-pay | Admitting: "Endocrinology

## 2017-10-28 ENCOUNTER — Other Ambulatory Visit: Payer: Self-pay | Admitting: Neurology

## 2017-10-28 DIAGNOSIS — G8929 Other chronic pain: Secondary | ICD-10-CM

## 2017-10-28 DIAGNOSIS — M545 Low back pain: Principal | ICD-10-CM

## 2017-11-03 DIAGNOSIS — Z79891 Long term (current) use of opiate analgesic: Secondary | ICD-10-CM | POA: Diagnosis not present

## 2017-11-03 DIAGNOSIS — M5415 Radiculopathy, thoracolumbar region: Secondary | ICD-10-CM | POA: Diagnosis not present

## 2017-11-03 DIAGNOSIS — M461 Sacroiliitis, not elsewhere classified: Secondary | ICD-10-CM | POA: Diagnosis not present

## 2017-11-03 DIAGNOSIS — I10 Essential (primary) hypertension: Secondary | ICD-10-CM | POA: Diagnosis not present

## 2017-11-06 ENCOUNTER — Other Ambulatory Visit: Payer: Medicare Other

## 2017-11-06 ENCOUNTER — Ambulatory Visit
Admission: RE | Admit: 2017-11-06 | Discharge: 2017-11-06 | Disposition: A | Discharge status: Home / Self Care | Payer: Medicare Other | Source: Ambulatory Visit | Attending: Neurology | Admitting: Neurology

## 2017-11-06 DIAGNOSIS — G8929 Other chronic pain: Secondary | ICD-10-CM

## 2017-11-06 DIAGNOSIS — M545 Low back pain: Secondary | ICD-10-CM | POA: Diagnosis not present

## 2017-11-06 MED ORDER — METHYLPREDNISOLONE ACETATE 40 MG/ML INJ SUSP (RADIOLOG
120.0000 mg | Freq: Once | INTRAMUSCULAR | Status: AC
  Administered 2017-11-06: 120 mg via EPIDURAL

## 2017-11-06 MED ORDER — IOPAMIDOL (ISOVUE-M 200) INJECTION 41%
1.0000 mL | Freq: Once | INTRAMUSCULAR | Status: AC
  Administered 2017-11-06: 1 mL via EPIDURAL

## 2017-11-10 DIAGNOSIS — Z6833 Body mass index (BMI) 33.0-33.9, adult: Secondary | ICD-10-CM | POA: Diagnosis not present

## 2017-11-10 DIAGNOSIS — E1142 Type 2 diabetes mellitus with diabetic polyneuropathy: Secondary | ICD-10-CM | POA: Diagnosis not present

## 2017-11-10 DIAGNOSIS — E1165 Type 2 diabetes mellitus with hyperglycemia: Secondary | ICD-10-CM | POA: Diagnosis not present

## 2017-11-10 DIAGNOSIS — E7800 Pure hypercholesterolemia, unspecified: Secondary | ICD-10-CM | POA: Diagnosis not present

## 2017-11-10 DIAGNOSIS — F419 Anxiety disorder, unspecified: Secondary | ICD-10-CM | POA: Diagnosis not present

## 2017-11-10 DIAGNOSIS — M545 Low back pain: Secondary | ICD-10-CM | POA: Diagnosis not present

## 2017-11-10 DIAGNOSIS — Z299 Encounter for prophylactic measures, unspecified: Secondary | ICD-10-CM | POA: Diagnosis not present

## 2017-11-11 ENCOUNTER — Ambulatory Visit: Payer: Medicare Other | Admitting: "Endocrinology

## 2017-11-11 ENCOUNTER — Encounter: Payer: Self-pay | Admitting: "Endocrinology

## 2017-11-16 ENCOUNTER — Other Ambulatory Visit: Payer: Self-pay | Admitting: "Endocrinology

## 2017-11-26 ENCOUNTER — Ambulatory Visit (INDEPENDENT_AMBULATORY_CARE_PROVIDER_SITE_OTHER): Payer: Medicare Other | Admitting: Podiatry

## 2017-11-26 DIAGNOSIS — M2042 Other hammer toe(s) (acquired), left foot: Secondary | ICD-10-CM | POA: Diagnosis not present

## 2017-11-26 DIAGNOSIS — M21619 Bunion of unspecified foot: Secondary | ICD-10-CM | POA: Diagnosis not present

## 2017-11-26 DIAGNOSIS — M2010 Hallux valgus (acquired), unspecified foot: Secondary | ICD-10-CM

## 2017-11-26 DIAGNOSIS — M2041 Other hammer toe(s) (acquired), right foot: Secondary | ICD-10-CM

## 2017-11-26 DIAGNOSIS — E1142 Type 2 diabetes mellitus with diabetic polyneuropathy: Secondary | ICD-10-CM | POA: Diagnosis not present

## 2017-11-26 DIAGNOSIS — S93492D Sprain of other ligament of left ankle, subsequent encounter: Secondary | ICD-10-CM

## 2017-11-26 NOTE — Progress Notes (Signed)
  Subjective:  Patient ID: [name], female    DOB: 11/19/1960,  MRN: [id number]  No chief complaint on file.  57 y.o. female returns for the above complaint.  States that the left foot is still hurting but the swelling has gone down.  Feels like the left ankle is unstable  Objective:  There were no vitals filed for this visit. General AA&O x3. Normal mood and affect.  Vascular Pedal pulses palpable.  Neurologic Epicritic sensation grossly intact. Protective sensation absent.  Dermatologic No open lesions. Skin normal texture and turgor.  Orthopedic: Pain to palpation L ATFL, Sinus Tarsi, Peroneals proximal to the lateral malleolus. Slight HAV bilat. Hammertoes bilat.   Assessment & Plan:  Patient was evaluated and treated and all questions answered.  Ankle Sprain L -Dispense Trilock ankle brace.  DM with DPN, HAV, Hammertoes -Rx for DM shoes  No Follow-up on file.

## 2017-12-04 ENCOUNTER — Encounter: Payer: Self-pay | Admitting: Podiatry

## 2017-12-05 ENCOUNTER — Emergency Department (HOSPITAL_COMMUNITY)
Admission: EM | Admit: 2017-12-05 | Discharge: 2017-12-05 | Disposition: A | Discharge status: Home / Self Care | Payer: Medicare Other

## 2017-12-05 NOTE — ED Notes (Signed)
Called x 2 no answer

## 2017-12-05 NOTE — ED Notes (Signed)
CALLED X 1 NO ANSWER

## 2017-12-07 DIAGNOSIS — R509 Fever, unspecified: Secondary | ICD-10-CM | POA: Diagnosis not present

## 2017-12-07 DIAGNOSIS — Z6832 Body mass index (BMI) 32.0-32.9, adult: Secondary | ICD-10-CM | POA: Diagnosis not present

## 2017-12-07 DIAGNOSIS — J069 Acute upper respiratory infection, unspecified: Secondary | ICD-10-CM | POA: Diagnosis not present

## 2017-12-07 DIAGNOSIS — Z789 Other specified health status: Secondary | ICD-10-CM | POA: Diagnosis not present

## 2017-12-07 DIAGNOSIS — I10 Essential (primary) hypertension: Secondary | ICD-10-CM | POA: Diagnosis not present

## 2017-12-07 DIAGNOSIS — Z299 Encounter for prophylactic measures, unspecified: Secondary | ICD-10-CM | POA: Diagnosis not present

## 2017-12-08 ENCOUNTER — Other Ambulatory Visit: Payer: Medicare Other

## 2017-12-08 DIAGNOSIS — R112 Nausea with vomiting, unspecified: Secondary | ICD-10-CM | POA: Diagnosis not present

## 2017-12-08 DIAGNOSIS — R05 Cough: Secondary | ICD-10-CM | POA: Diagnosis not present

## 2017-12-08 DIAGNOSIS — E785 Hyperlipidemia, unspecified: Secondary | ICD-10-CM | POA: Diagnosis not present

## 2017-12-08 DIAGNOSIS — N390 Urinary tract infection, site not specified: Secondary | ICD-10-CM | POA: Diagnosis not present

## 2017-12-08 DIAGNOSIS — J189 Pneumonia, unspecified organism: Secondary | ICD-10-CM | POA: Diagnosis not present

## 2017-12-08 DIAGNOSIS — I10 Essential (primary) hypertension: Secondary | ICD-10-CM | POA: Diagnosis not present

## 2017-12-08 DIAGNOSIS — E1140 Type 2 diabetes mellitus with diabetic neuropathy, unspecified: Secondary | ICD-10-CM | POA: Diagnosis not present

## 2017-12-08 DIAGNOSIS — J181 Lobar pneumonia, unspecified organism: Secondary | ICD-10-CM | POA: Diagnosis not present

## 2017-12-08 DIAGNOSIS — K219 Gastro-esophageal reflux disease without esophagitis: Secondary | ICD-10-CM | POA: Diagnosis not present

## 2017-12-08 DIAGNOSIS — R740 Nonspecific elevation of levels of transaminase and lactic acid dehydrogenase [LDH]: Secondary | ICD-10-CM | POA: Diagnosis not present

## 2017-12-09 DIAGNOSIS — Z833 Family history of diabetes mellitus: Secondary | ICD-10-CM | POA: Diagnosis not present

## 2017-12-09 DIAGNOSIS — J189 Pneumonia, unspecified organism: Secondary | ICD-10-CM | POA: Diagnosis not present

## 2017-12-09 DIAGNOSIS — Z7984 Long term (current) use of oral hypoglycemic drugs: Secondary | ICD-10-CM | POA: Diagnosis not present

## 2017-12-09 DIAGNOSIS — Z8249 Family history of ischemic heart disease and other diseases of the circulatory system: Secondary | ICD-10-CM | POA: Diagnosis not present

## 2017-12-09 DIAGNOSIS — N390 Urinary tract infection, site not specified: Secondary | ICD-10-CM | POA: Diagnosis not present

## 2017-12-09 DIAGNOSIS — Z825 Family history of asthma and other chronic lower respiratory diseases: Secondary | ICD-10-CM | POA: Diagnosis not present

## 2017-12-09 DIAGNOSIS — M170 Bilateral primary osteoarthritis of knee: Secondary | ICD-10-CM | POA: Diagnosis present

## 2017-12-09 DIAGNOSIS — Z888 Allergy status to other drugs, medicaments and biological substances status: Secondary | ICD-10-CM | POA: Diagnosis not present

## 2017-12-09 DIAGNOSIS — Z79891 Long term (current) use of opiate analgesic: Secondary | ICD-10-CM | POA: Diagnosis not present

## 2017-12-09 DIAGNOSIS — E785 Hyperlipidemia, unspecified: Secondary | ICD-10-CM | POA: Diagnosis present

## 2017-12-09 DIAGNOSIS — F419 Anxiety disorder, unspecified: Secondary | ICD-10-CM | POA: Diagnosis present

## 2017-12-09 DIAGNOSIS — K219 Gastro-esophageal reflux disease without esophagitis: Secondary | ICD-10-CM | POA: Diagnosis present

## 2017-12-09 DIAGNOSIS — I10 Essential (primary) hypertension: Secondary | ICD-10-CM | POA: Diagnosis not present

## 2017-12-09 DIAGNOSIS — K7469 Other cirrhosis of liver: Secondary | ICD-10-CM | POA: Diagnosis present

## 2017-12-09 DIAGNOSIS — Z79899 Other long term (current) drug therapy: Secondary | ICD-10-CM | POA: Diagnosis not present

## 2017-12-09 DIAGNOSIS — R197 Diarrhea, unspecified: Secondary | ICD-10-CM | POA: Diagnosis present

## 2017-12-09 DIAGNOSIS — Z881 Allergy status to other antibiotic agents status: Secondary | ICD-10-CM | POA: Diagnosis not present

## 2017-12-09 DIAGNOSIS — E1140 Type 2 diabetes mellitus with diabetic neuropathy, unspecified: Secondary | ICD-10-CM | POA: Diagnosis not present

## 2017-12-09 DIAGNOSIS — K760 Fatty (change of) liver, not elsewhere classified: Secondary | ICD-10-CM | POA: Diagnosis present

## 2017-12-09 DIAGNOSIS — Z7951 Long term (current) use of inhaled steroids: Secondary | ICD-10-CM | POA: Diagnosis not present

## 2017-12-18 DIAGNOSIS — Z09 Encounter for follow-up examination after completed treatment for conditions other than malignant neoplasm: Secondary | ICD-10-CM | POA: Diagnosis not present

## 2017-12-18 DIAGNOSIS — Z299 Encounter for prophylactic measures, unspecified: Secondary | ICD-10-CM | POA: Diagnosis not present

## 2017-12-18 DIAGNOSIS — E1142 Type 2 diabetes mellitus with diabetic polyneuropathy: Secondary | ICD-10-CM | POA: Diagnosis not present

## 2017-12-18 DIAGNOSIS — Z6832 Body mass index (BMI) 32.0-32.9, adult: Secondary | ICD-10-CM | POA: Diagnosis not present

## 2017-12-18 DIAGNOSIS — R110 Nausea: Secondary | ICD-10-CM | POA: Diagnosis not present

## 2017-12-18 DIAGNOSIS — E1165 Type 2 diabetes mellitus with hyperglycemia: Secondary | ICD-10-CM | POA: Diagnosis not present

## 2017-12-18 DIAGNOSIS — J189 Pneumonia, unspecified organism: Secondary | ICD-10-CM | POA: Diagnosis not present

## 2017-12-22 ENCOUNTER — Ambulatory Visit: Payer: Medicare Other | Admitting: Orthotics

## 2017-12-22 DIAGNOSIS — M2010 Hallux valgus (acquired), unspecified foot: Secondary | ICD-10-CM

## 2017-12-22 DIAGNOSIS — M2042 Other hammer toe(s) (acquired), left foot: Secondary | ICD-10-CM

## 2017-12-22 DIAGNOSIS — M2041 Other hammer toe(s) (acquired), right foot: Secondary | ICD-10-CM

## 2017-12-22 DIAGNOSIS — E1142 Type 2 diabetes mellitus with diabetic polyneuropathy: Secondary | ICD-10-CM

## 2017-12-22 DIAGNOSIS — M21619 Bunion of unspecified foot: Secondary | ICD-10-CM

## 2017-12-22 NOTE — Progress Notes (Signed)
Patient cast today for diabetic shoes per [name], patient measured 29M on [place] and chose [place] [id number]

## 2017-12-24 ENCOUNTER — Ambulatory Visit (INDEPENDENT_AMBULATORY_CARE_PROVIDER_SITE_OTHER): Payer: Medicare Other | Admitting: Podiatry

## 2017-12-24 DIAGNOSIS — M7672 Peroneal tendinitis, left leg: Secondary | ICD-10-CM | POA: Diagnosis not present

## 2017-12-24 DIAGNOSIS — S93402S Sprain of unspecified ligament of left ankle, sequela: Secondary | ICD-10-CM | POA: Diagnosis not present

## 2017-12-24 DIAGNOSIS — M25372 Other instability, left ankle: Secondary | ICD-10-CM

## 2017-12-27 ENCOUNTER — Ambulatory Visit
Admission: RE | Admit: 2017-12-27 | Discharge: 2017-12-27 | Disposition: A | Discharge status: Home / Self Care | Payer: Medicare Other | Source: Ambulatory Visit | Attending: Podiatry | Admitting: Podiatry

## 2017-12-27 DIAGNOSIS — M722 Plantar fascial fibromatosis: Secondary | ICD-10-CM | POA: Diagnosis not present

## 2018-01-02 NOTE — Progress Notes (Signed)
  Subjective:  Patient ID: [name], female    DOB: 14-Apr-1961,  MRN: [id number]  Chief Complaint  Patient presents with  . Foot Injury    left ankle sprain - patient reports the brace is not making much difference   58 y.o. female returns for the above complaint.  States that she is still having significant pain despite the brace. Objective:  There were no vitals filed for this visit. General AA&O x3. Normal mood and affect.  Vascular Pedal pulses palpable.  Neurologic Epicritic sensation grossly intact. Protective sensation absent.  Dermatologic No open lesions. Skin normal texture and turgor.  Orthopedic: Pain to palpation L ATFL, Sinus Tarsi, Peroneals proximal to the lateral malleolus. Slight HAV bilat. Hammertoes bilat.   Assessment & Plan:  Patient was evaluated and treated and all questions answered.  Sprain of left ankle sequela, ankle instability, peroneal tendinitis -Order MRI to evaluate the above due to failure of conservative therapy for several months duration.  Follow-up after MRI  Return in about 1 month (around 01/21/2018).

## 2018-01-11 ENCOUNTER — Other Ambulatory Visit: Payer: Self-pay | Admitting: "Endocrinology

## 2018-01-12 ENCOUNTER — Ambulatory Visit (INDEPENDENT_AMBULATORY_CARE_PROVIDER_SITE_OTHER): Payer: Medicare Other | Admitting: "Endocrinology

## 2018-01-12 ENCOUNTER — Encounter: Payer: Self-pay | Admitting: "Endocrinology

## 2018-01-12 VITALS — BP 135/78 | HR 81 | Ht 62.0 in | Wt 178.0 lb

## 2018-01-12 DIAGNOSIS — Z794 Long term (current) use of insulin: Secondary | ICD-10-CM

## 2018-01-12 DIAGNOSIS — I10 Essential (primary) hypertension: Secondary | ICD-10-CM | POA: Diagnosis not present

## 2018-01-12 DIAGNOSIS — IMO0002 Reserved for concepts with insufficient information to code with codable children: Secondary | ICD-10-CM | POA: Insufficient documentation

## 2018-01-12 DIAGNOSIS — E782 Mixed hyperlipidemia: Secondary | ICD-10-CM

## 2018-01-12 DIAGNOSIS — E1165 Type 2 diabetes mellitus with hyperglycemia: Secondary | ICD-10-CM | POA: Diagnosis not present

## 2018-01-12 DIAGNOSIS — E118 Type 2 diabetes mellitus with unspecified complications: Secondary | ICD-10-CM | POA: Diagnosis not present

## 2018-01-12 DIAGNOSIS — Z6833 Body mass index (BMI) 33.0-33.9, adult: Secondary | ICD-10-CM | POA: Diagnosis not present

## 2018-01-12 DIAGNOSIS — E6609 Other obesity due to excess calories: Secondary | ICD-10-CM | POA: Diagnosis not present

## 2018-01-12 NOTE — Progress Notes (Signed)
Subjective:    Patient ID: [name], female    DOB: January 04, 1961. Patient is being seen in f/u for management of diabetes requested by  Glenda Chroman, MD  Past Medical History:  Diagnosis Date  . Anxiety disorder   . Chronic low back pain   . Cirrhosis of liver ([place])   . Colitis   . Diabetes mellitus   . Diabetic neuropathy ([place])   . Diverticulitis   . Fatty liver   . GERD (gastroesophageal reflux disease)   . High cholesterol   . Hypertension   . Irritable bowel syndrome   . Meniere's disease   . Neuropathy   . Pain    Past Surgical History:  Procedure Laterality Date  . ABDOMINAL HYSTERECTOMY    . BIOPSY  03/20/2017   Procedure: BIOPSY;  Surgeon: [name], MD;  [facility];  Service: Endoscopy;;  colon gastric  . bladder tact  2005  . CHOLECYSTECTOMY  08/11  . COLONOSCOPY  2208  . COLONOSCOPY  In of September 2014   @ [facility]/Dr,.[name]  . COLONOSCOPY WITH PROPOFOL N/A 03/20/2017   Procedure: COLONOSCOPY WITH PROPOFOL;  Surgeon: [name], MD;  [facility];  Service: Endoscopy;  Laterality: N/A;  . ECTOPIC PREGNANCY SURGERY  1996  . ESOPHAGOGASTRODUODENOSCOPY (EGD) WITH PROPOFOL N/A 03/20/2017   Procedure: ESOPHAGOGASTRODUODENOSCOPY (EGD) WITH PROPOFOL;  Surgeon: [name], MD;  [facility];  Service: Endoscopy;  Laterality: N/A;  12:45  . HERNIA REPAIR  09/17/11  . [place]  2009   Preformed on the right shoulder  . OVARIAN CYST REMOVAL     right side  . PARTIAL HYSTERECTOMY  2005  . POLYPECTOMY  03/20/2017   Procedure: POLYPECTOMY;  Surgeon: [name], MD;  [facility];  Service: Endoscopy;;  colon  . UPPER GASTROINTESTINAL ENDOSCOPY  2008  . URETERAL STENT PLACEMENT     Social History   Socioeconomic History  . Marital status: Divorced    Spouse name: Not on file  . Number of children: Not on file  . Years of education: Not on file  . Highest education level: Not on file   Occupational History  . Not on file  Social Needs  . Financial resource strain: Not on file  . Food insecurity:    Worry: Not on file    Inability: Not on file  . Transportation needs:    Medical: Not on file    Non-medical: Not on file  Tobacco Use  . Smoking status: Never Smoker  . Smokeless tobacco: Never Used  Substance and Sexual Activity  . Alcohol use: No  . Drug use: No  . Sexual activity: Yes    Birth control/protection: Surgical  Lifestyle  . Physical activity:    Days per week: Not on file    Minutes per session: Not on file  . Stress: Not on file  Relationships  . Social connections:    Talks on phone: Not on file    Gets together: Not on file    Attends religious service: Not on file    Active member of club or organization: Not on file    Attends meetings of clubs or organizations: Not on file    Relationship status: Not on file  Other Topics Concern  . Not on file  Social History Narrative  . Not on file   Outpatient Encounter Medications as of 01/12/2018  Medication Sig  .  acetaZOLAMIDE (DIAMOX) 250 MG tablet Take 250 mg by mouth daily.   Marland Kitchen acyclovir (ZOVIRAX) 400 MG tablet Take 1 tablet by mouth 3 (three) times daily as needed (fever blisters).   Marland Kitchen atorvastatin (LIPITOR) 20 MG tablet TAKE ONE TABLET BY MOUTH DAILY.  Marland Kitchen Blood Glucose Monitoring Suppl (FREESTYLE LITE) DEVI Used to monitor blood glucose.  . Cholecalciferol (VITAMIN D3 PO) Take 4,000 Units by mouth daily.   . ciclopirox (PENLAC) 8 % solution Apply topically at bedtime. Apply over nail and surrounding skin. Apply daily over previous coat. Remove weekly with file or polish remover.  . Continuous Blood Gluc Sensor (FREESTYLE LIBRE SENSOR SYSTEM) MISC Use one sensor every 10 days.  . cyanocobalamin (,VITAMIN B-12,) 1000 MCG/ML injection Inject 1,000 mcg into the muscle every 30 (thirty) days.  . diazepam (VALIUM) 2 MG tablet Take 2 mg by mouth every 12 (twelve) hours as needed for anxiety.   .  dicyclomine (BENTYL) 20 MG tablet Take 1 tablet (20 mg total) by mouth every 8 (eight) hours as needed for spasms.  . DULoxetine (CYMBALTA) 60 MG capsule Take 60 mg by mouth daily.  Marland Kitchen estradiol (ESTRACE) 0.5 MG tablet Take 0.5 mg by mouth every evening.   . ferrous sulfate 325 (65 FE) MG tablet Take 1 tablet (325 mg total) by mouth daily. (Patient taking differently: Take 325 mg by mouth every 3 (three) days. )  . fluticasone (FLONASE) 50 MCG/ACT nasal spray Place 2 sprays into both nostrils every morning.   Marland Kitchen glucose blood (FREESTYLE LITE) test strip Use as instructed  . metFORMIN (GLUCOPHAGE) 1000 MG tablet TAKE ONE TABLET BY MOUTH TWICE DAILY WITH A MEAL  . metoCLOPramide (REGLAN) 10 MG tablet TAKE ONE TABLET BY MOUTH AT BEDTIME (Patient not taking: Reported on 10/13/2017)  . metoprolol (LOPRESSOR) 50 MG tablet Take 25 mg by mouth daily. Patient takes 1/2 tablet daily  . Multiple Vitamins-Minerals (WOMENS MULTI PO) Take 1 tablet by mouth daily.   . ondansetron (ZOFRAN) 4 MG tablet Take 1 tablet (4 mg total) by mouth every 8 (eight) hours as needed for nausea or vomiting.  Marland Kitchen oxyCODONE-acetaminophen (PERCOCET/ROXICET) 5-325 MG tablet Take 1-2 tablets by mouth daily as needed for moderate pain.   . pantoprazole (PROTONIX) 40 MG tablet TAKE ONE TABLET BY MOUTH DAILY (STOP OMEPRAZOLE)  . PARoxetine (PAXIL) 10 MG tablet Take 10 mg by mouth daily.   Marland Kitchen PRODIGY TWIST TOP LANCETS 28G MISC TEST TWICE DAILY  . tiZANidine (ZANAFLEX) 4 MG capsule Take 4 mg as needed by mouth for muscle spasms.  . traMADol (ULTRAM) 50 MG tablet Take 50 mg by mouth every 8 (eight) hours as needed.   . TRESIBA FLEXTOUCH 200 UNIT/ML SOPN INJECT 60 UNITS AT BEDTIME.  . Turmeric 500 MG CAPS Take 1 capsule by mouth daily.   . [DISCONTINUED] dicyclomine (BENTYL) 10 MG capsule Take 10 mg by mouth 3 (three) times daily before meals.    No facility-administered encounter medications on file as of 01/12/2018.    ALLERGIES: Allergies   Allergen Reactions  . Flagyl [Metronidazole Hcl] Itching and Rash  . Nsaids     nausea   VACCINATION STATUS:  There is no immunization history on file for this patient.  Diabetes  She presents for her follow-up diabetic visit. She has type 2 diabetes mellitus. Onset time: She was diagnosed approximately at age 55 years. Her disease course has been improving. There are no hypoglycemic associated symptoms. Pertinent negatives for hypoglycemia include no confusion, headaches,  pallor or seizures. Associated symptoms include fatigue. Pertinent negatives for diabetes include no chest pain, no polydipsia, no polyphagia and no polyuria. There are no hypoglycemic complications. Symptoms are improving. There are no diabetic complications. Risk factors for coronary artery disease include diabetes mellitus, dyslipidemia, hypertension, obesity, sedentary lifestyle and family history. Current diabetic treatment includes oral agent (monotherapy) and intensive insulin program (She is on Tresiba 50 units  subcutaneously daily at bedtime, metformin 1000 g by mouth twice a day. ). Her weight is stable. She is following a generally unhealthy diet. When asked about meal planning, she reported none. She has not had a previous visit with a dietitian. She never participates in exercise. Her home blood glucose trend is fluctuating minimally. Her breakfast blood glucose range is generally 140-180 mg/dl. Her dinner blood glucose range is generally 180-200 mg/dl. Her overall blood glucose range is 180-200 mg/dl. An ACE inhibitor/angiotensin II receptor blocker is not being taken. Eye exam is current.  Hypertension  This is a chronic problem. The current episode started more than 1 year ago. The problem is controlled. Pertinent negatives include no chest pain, headaches, palpitations or shortness of breath. Risk factors for coronary artery disease include diabetes mellitus, obesity and sedentary lifestyle. Past treatments include  beta blockers. Compliance problems include diet and exercise.   Hyperlipidemia  Pertinent negatives include no chest pain, myalgias or shortness of breath.    Review of Systems  Constitutional: Positive for fatigue. Negative for unexpected weight change.  HENT: Negative for trouble swallowing and voice change.   Eyes: Negative for visual disturbance.  Respiratory: Negative for cough, shortness of breath and wheezing.   Cardiovascular: Negative for chest pain, palpitations and leg swelling.  Gastrointestinal: Negative for diarrhea, nausea and vomiting.  Endocrine: Negative for cold intolerance, heat intolerance, polydipsia, polyphagia and polyuria.  Genitourinary: Negative for dysuria, flank pain and frequency.  Musculoskeletal: Negative for arthralgias and myalgias.  Skin: Negative for color change, pallor, rash and wound.  Neurological: Negative for seizures and headaches.  Psychiatric/Behavioral: Negative for confusion and suicidal ideas.    Objective:    BP 135/78   Pulse 81   Ht 5' 2"  (1.575 m)   Wt 178 lb (80.7 kg)   BMI 32.56 kg/m   Wt Readings from Last 3 Encounters:  01/12/18 178 lb (80.7 kg)  10/13/17 185 lb (83.9 kg)  08/18/17 183 lb 9.6 oz (83.3 kg)    Physical Exam  Constitutional: She is oriented to person, place, and time. She appears well-developed.  Obese.  HENT:  Head: Normocephalic and atraumatic.  Eyes: EOM are normal.  Neck: Normal range of motion. Neck supple. No tracheal deviation present. No thyromegaly present.  Cardiovascular: Normal rate.  Pulmonary/Chest: Effort normal.  Abdominal: There is no tenderness. There is no guarding.  Musculoskeletal: Normal range of motion. She exhibits no edema.  Neurological: She is alert and oriented to person, place, and time. She has normal reflexes. No cranial nerve deficit. Coordination normal.  Skin: Skin is warm and dry. No rash noted. No erythema. No pallor.  Psychiatric: She has a normal mood and affect.  Judgment normal.    CMP     Component Value Date/Time   NA 138 10/13/2017 1013   K 3.9 10/13/2017 1013   CL 107 10/13/2017 1013   CO2 21 (L) 10/13/2017 1013   GLUCOSE 133 (H) 10/13/2017 1013   BUN 11 10/13/2017 1013   CREATININE 0.68 10/13/2017 1013   CREATININE 0.71 10/13/2017 0816   CALCIUM 9.1  10/13/2017 1013   PROT 7.2 10/13/2017 1013   ALBUMIN 3.4 (L) 10/13/2017 1013   AST 31 10/13/2017 1013   ALT 24 10/13/2017 1013   ALKPHOS 86 10/13/2017 1013   BILITOT 0.3 10/13/2017 1013   GFRNONAA >60 10/13/2017 1013   GFRNONAA 95 10/13/2017 0816   GFRAA >60 10/13/2017 1013   GFRAA 110 10/13/2017 0816     Diabetic Labs (most recent): Lab Results  Component Value Date   HGBA1C 9.7 (H) 10/13/2017   HGBA1C 8.7 (H) 07/20/2017   HGBA1C 9.1 (H) 04/15/2017   Lipid Panel     Component Value Date/Time   CHOL 146 10/13/2017 0816   TRIG 90 10/13/2017 0816   HDL 49 (L) 10/13/2017 0816   CHOLHDL 3.0 10/13/2017 0816   VLDL 29 06/30/2016 1251   LDLCALC 80 10/13/2017 0816     Assessment & Plan:   1. Uncontrolled type 2 diabetes mellitus with complication, with long-term current use of insulin ([place]) - Patient has currently uncontrolled symptomatic type 2 DM since  57 years of age. -She has not done her previsit labs, A1c was 9.7% during her last visit in January 2019.   In the recent 2 weeks her blood glucose averages between 100- 150.   Recent labs reviewed.   Her diabetes is complicated by obesity, insulin resistance, and patient remains at a high risk for more acute and chronic complications of diabetes which include CAD, CVA, CKD, retinopathy, and neuropathy. These are all discussed in detail with the patient.  - I have counseled the patient on diet management and weight loss, by adopting a carbohydrate restricted/protein rich diet.  -  Suggestion is made for her to avoid simple carbohydrates  from her diet including Cakes, Sweet Desserts / Pastries, Ice Cream, Soda (diet and  regular), Sweet Tea, Candies, Chips, Cookies, Store Bought Juices, Alcohol in Excess of  1-2 drinks a day, Artificial Sweeteners, and "Sugar-free" Products. This will help patient to have stable blood glucose profile and potentially avoid unintended weight gain.   - I encouraged the patient to switch to  unprocessed or minimally processed complex starch and increased protein intake (animal or plant source), fruits, and vegetables.  - Patient is advised to stick to a routine mealtimes to eat 3 meals  a day and avoid unnecessary snacks ( to snack only to correct hypoglycemia).  - She declines referral to CDE for now.  - I have approached patient with the following individualized plan to manage diabetes and patient agrees:   -  Based on her recent glycemic response, she will not require prandial insulin for now.   - I advised her to continue Tresiba 60 units  daily at bedtime, associated with strict monitoring of blood glucose 2 times a day-before breakfast and at bedtime.   -  (she  previously took 130 units of basal insulin and 45 units of prandial insulin daily). - Continue to hold prandial insulin for now. - She will continue to monitor blood glucose at least 2 times a day-daily before breakfast and at bedtime.  -Patient is encouraged to call clinic for blood glucose levels less than 70 or above 200 mg /dl. - I will continue metformin  1000 mg by mouth twice a day ,therapeutically suitable for patient. -   her insurance declines to cover for continuous glucose monitoring device.   -  She has history of intolerance for [place], likely would not tolerate other  incretin therapy.  - Patient specific target  A1c;  LDL, HDL, Triglycerides, and  Waist Circumference were discussed in detail.  2) BP/HTN: Her blood pressure is controlled to target.  She is advised to continue her current medications.  3) Lipids/HPL:   Total with LDL at 80, generally improving from 141.   I  advised her to continue  atorvastatin 20 mg by mouth daily at bedtime. Side effects and precautions discussed with her.  4)  Weight/Diet: He has declined CD consult. exercise, and detailed carbohydrates information provided.  5) Chronic Care/Health Maintenance:  -Patient  will be considered for ACE inhibitor's and statins as appropriate, and encouraged to continue to follow up with Ophthalmology, Podiatrist at least yearly or according to recommendations, and advised to   stay away from smoking. I have recommended yearly flu vaccine and pneumonia vaccination at least every 5 years; moderate intensity exercise for up to 150 minutes weekly; and  sleep for at least 7 hours a day.  - I advised patient to maintain close follow up with Glenda Chroman, MD for primary care needs.  - Time spent with the patient: 25 min, of which >50% was spent in reviewing her blood glucose logs , discussing her hypo- and hyper-glycemic episodes, reviewing her current and  previous labs and insulin doses and developing a plan to avoid hypo- and hyper-glycemia. Please refer to Patient Instructions for Blood Glucose Monitoring and Insulin/Medications Dosing Guide"  in media tab for additional information. [name] participated in the discussions, expressed understanding, and voiced agreement with the above plans.  All questions were answered to her satisfaction. she is encouraged to contact clinic should she have any questions or concerns prior to her return visit.   Follow up plan: - Return in about 8 weeks (around 03/09/2018) for follow up with pre-visit labs, meter, and logs.  Glade Lloyd, MD Phone: [phone]  Fax: [phone]  This note was partially dictated with voice recognition software. Similar sounding words can be transcribed inadequately or may not  be corrected upon review.  01/12/2018, 1:38 PM

## 2018-01-12 NOTE — Patient Instructions (Signed)

## 2018-01-14 ENCOUNTER — Ambulatory Visit (INDEPENDENT_AMBULATORY_CARE_PROVIDER_SITE_OTHER): Payer: Medicare Other | Admitting: Podiatry

## 2018-01-14 DIAGNOSIS — M7672 Peroneal tendinitis, left leg: Secondary | ICD-10-CM

## 2018-01-14 DIAGNOSIS — M25372 Other instability, left ankle: Secondary | ICD-10-CM | POA: Diagnosis not present

## 2018-01-14 NOTE — Patient Instructions (Signed)
Pre-Operative Instructions  Congratulations, you have decided to take an important step towards improving your quality of life.  You can be assured that the doctors and staff at [facility] will be with you every step of the way.  Here are some important things you should know:  1. Plan to be at the surgery center/hospital at least 1 (one) hour prior to your scheduled time, unless otherwise directed by the surgical center/hospital staff.  You must have a responsible adult accompany you, remain during the surgery and drive you home.  Make sure you have directions to the surgical center/hospital to ensure you arrive on time. 2. If you are having surgery at [facility] or [facility] hospitals, you will need a copy of your medical history and physical form from your family physician within one month prior to the date of surgery. We will give you a form for your primary physician to complete.  3. We make every effort to accommodate the date you request for surgery.  However, there are times where surgery dates or times have to be moved.  We will contact you as soon as possible if a change in schedule is required.   4. No aspirin/ibuprofen for one week before surgery.  If you are on aspirin, any non-steroidal anti-inflammatory medications (Mobic, Aleve, Ibuprofen) should not be taken seven (7) days prior to your surgery.  You make take Tylenol for pain prior to surgery.  5. Medications - If you are taking daily heart and blood pressure medications, seizure, reflux, allergy, asthma, anxiety, pain or diabetes medications, make sure you notify the surgery center/hospital before the day of surgery so they can tell you which medications you should take or avoid the day of surgery. 6. No food or drink after midnight the night before surgery unless directed otherwise by surgical center/hospital staff. 7. No alcoholic beverages 24-hours prior to surgery.  No smoking 24-hours prior or 24-hours after  surgery. 8. Wear loose pants or shorts. They should be loose enough to fit over bandages, boots, and casts. 9. Don't wear slip-on shoes. Sneakers are preferred. 10. Bring your boot with you to the surgery center/hospital.  Also bring crutches or a walker if your physician has prescribed it for you.  If you do not have this equipment, it will be provided for you after surgery. 11. If you have not been contacted by the surgery center/hospital by the day before your surgery, call to confirm the date and time of your surgery. 12. Leave-time from work may vary depending on the type of surgery you have.  Appropriate arrangements should be made prior to surgery with your employer. 13. Prescriptions will be provided immediately following surgery by your doctor.  Fill these as soon as possible after surgery and take the medication as directed. Pain medications will not be refilled on weekends and must be approved by the doctor. 14. Remove nail polish on the operative foot and avoid getting pedicures prior to surgery. 15. Wash the night before surgery.  The night before surgery wash the foot and leg well with water and the antibacterial soap provided. Be sure to pay special attention to beneath the toenails and in between the toes.  Wash for at least three (3) minutes. Rinse thoroughly with water and dry well with a towel.  Perform this wash unless told not to do so by your physician.  Enclosed: 1 Ice pack (please put in freezer the night before surgery)   1 Hibiclens skin cleaner     Pre-op instructions  If you have any questions regarding the instructions, please do not hesitate to call our office.  [place]: [address], [place], [place] [address] -- [phone]  [place]: [address] [place], [place] [address] -- [phone]  [place]: [address]  [place], [place] [address] -- [phone]  [place]: [address], [address], [place],  [address] -- [phone]  Website:  [email or web address] 

## 2018-01-14 NOTE — Progress Notes (Signed)
  Subjective:  Patient ID: [name], female    DOB: 04/05/1961,  MRN: [id number]  Chief Complaint  Patient presents with  . Foot Pain    follow up ankle instability Left   57 y.o. female returns for the above complaint.   had MRI and is here for review. Objective:  There were no vitals filed for this visit. General AA&O x3. Normal mood and affect.  Vascular Pedal pulses palpable.  Neurologic Epicritic sensation grossly intact. Protective sensation absent.  Dermatologic No open lesions. Skin normal texture and turgor.  Orthopedic: Pain to palpation L ATFL, Sinus Tarsi, Peroneals proximal to the lateral malleolus. Slight HAV bilat. Hammertoes bilat.   Assessment & Plan:  Patient was evaluated and treated and all questions answered.  Sprain of left ankle sequela, ankle instability, peroneal tendinitis -MRI reviewed with patient.  Discussed  performing peroneal brevis repair with ATFL augmentation.  All risk benefits alternatives to surgical plan with patient no guarantees given.  Patient does would like to proceed.  We will plan for surgical date to be determined by the surgical coordinator  Return in about 1 week (around 01/21/2018) for post op left peroneal tendon repair.

## 2018-01-14 NOTE — H&P (View-Only) (Signed)
  Subjective:  Patient ID: [name], female    DOB: June 23, 1961,  MRN: [id number]  Chief Complaint  Patient presents with  . Foot Pain    follow up ankle instability Left   57 y.o. female returns for the above complaint.   had MRI and is here for review. Objective:  There were no vitals filed for this visit. General AA&O x3. Normal mood and affect.  Vascular Pedal pulses palpable.  Neurologic Epicritic sensation grossly intact. Protective sensation absent.  Dermatologic No open lesions. Skin normal texture and turgor.  Orthopedic: Pain to palpation L ATFL, Sinus Tarsi, Peroneals proximal to the lateral malleolus. Slight HAV bilat. Hammertoes bilat.   Assessment & Plan:  Patient was evaluated and treated and all questions answered.  Sprain of left ankle sequela, ankle instability, peroneal tendinitis -MRI reviewed with patient.  Discussed  performing peroneal brevis repair with ATFL augmentation.  All risk benefits alternatives to surgical plan with patient no guarantees given.  Patient does would like to proceed.  We will plan for surgical date to be determined by the surgical coordinator  Return in about 1 week (around 01/21/2018) for post op left peroneal tendon repair.

## 2018-01-15 ENCOUNTER — Other Ambulatory Visit: Payer: Self-pay | Admitting: "Endocrinology

## 2018-01-15 DIAGNOSIS — J189 Pneumonia, unspecified organism: Secondary | ICD-10-CM | POA: Diagnosis not present

## 2018-01-18 DIAGNOSIS — F329 Major depressive disorder, single episode, unspecified: Secondary | ICD-10-CM | POA: Diagnosis not present

## 2018-01-18 DIAGNOSIS — F419 Anxiety disorder, unspecified: Secondary | ICD-10-CM | POA: Diagnosis not present

## 2018-01-18 DIAGNOSIS — I10 Essential (primary) hypertension: Secondary | ICD-10-CM | POA: Diagnosis not present

## 2018-01-18 DIAGNOSIS — Z299 Encounter for prophylactic measures, unspecified: Secondary | ICD-10-CM | POA: Diagnosis not present

## 2018-01-18 DIAGNOSIS — Z6832 Body mass index (BMI) 32.0-32.9, adult: Secondary | ICD-10-CM | POA: Diagnosis not present

## 2018-01-20 ENCOUNTER — Encounter: Payer: Self-pay | Admitting: Podiatry

## 2018-01-21 ENCOUNTER — Telehealth: Payer: Self-pay | Admitting: Podiatry

## 2018-01-21 ENCOUNTER — Ambulatory Visit: Payer: Medicare Other | Admitting: Podiatry

## 2018-01-21 DIAGNOSIS — S93402S Sprain of unspecified ligament of left ankle, sequela: Secondary | ICD-10-CM

## 2018-01-21 DIAGNOSIS — M7672 Peroneal tendinitis, left leg: Secondary | ICD-10-CM

## 2018-01-21 DIAGNOSIS — M25372 Other instability, left ankle: Secondary | ICD-10-CM

## 2018-01-21 NOTE — Telephone Encounter (Signed)
I'm calling to see if my surgery has been scheduled? Also, I'm calling because I sent a message through MyChart to see if they could write me a letter saying I need the extra help in home after surgery. If you would please give me a call back at [phone]. I would appreciate it. Thank you.

## 2018-01-21 NOTE — Telephone Encounter (Signed)
Called pt to let her know that I got her message and that I've been helping fill in for our surgical coordinator this week. I told her I would send the message to [facility] and that Delydia should give her a call on Monday or Tuesday at the latest in regards to the date of her surgery as well as the letter.

## 2018-01-25 NOTE — Telephone Encounter (Signed)
I attempted to call the patient.  I left her a message to give me a call back.  I need to get a date for her surgery.

## 2018-01-25 NOTE — Addendum Note (Signed)
Addended by: Lolita Rieger on: 01/25/2018 02:39 PM   Modules accepted: Orders

## 2018-01-25 NOTE — Telephone Encounter (Signed)
"  I'm returning your call."  I was calling to schedule your surgery.  Do you have a date in mind that you would like to do it?  "I'd like to do it either May 8 or May 16."  May 8 is better for him.  "Okay, let's schedule it for then."  You can register now on-line with the surgical center.  Let them know how you would like to communicate with them, example, by phone call, text message, or email.  "Okay, I will, thank you so much."

## 2018-01-26 ENCOUNTER — Telehealth: Payer: Self-pay | Admitting: *Deleted

## 2018-01-26 DIAGNOSIS — M7672 Peroneal tendinitis, left leg: Secondary | ICD-10-CM

## 2018-01-26 NOTE — Telephone Encounter (Signed)
Pt states [facility] contacted her and the knee scooter is not covered by Medicare or Medicaid, and she needs a letter for people to come and help her at home.

## 2018-01-27 NOTE — Telephone Encounter (Signed)
I attempted to call the patient.  I left her a message to call me back.

## 2018-01-28 ENCOUNTER — Other Ambulatory Visit: Payer: Self-pay | Admitting: "Endocrinology

## 2018-01-28 DIAGNOSIS — M5415 Radiculopathy, thoracolumbar region: Secondary | ICD-10-CM | POA: Diagnosis not present

## 2018-01-28 DIAGNOSIS — I10 Essential (primary) hypertension: Secondary | ICD-10-CM | POA: Diagnosis not present

## 2018-01-28 DIAGNOSIS — M461 Sacroiliitis, not elsewhere classified: Secondary | ICD-10-CM | POA: Diagnosis not present

## 2018-01-28 DIAGNOSIS — Z79891 Long term (current) use of opiate analgesic: Secondary | ICD-10-CM | POA: Diagnosis not present

## 2018-01-28 NOTE — Telephone Encounter (Signed)
"  [name] is scheduled for surgery on 02/10/2018.  The implants are going to be way to high.  [employer] is $3100 and the other is $975.  The allowable is only $1100.  Can he use something cheaper?  You may have to switch it to the hospital."  Per Dr. March Rummage, move it to the hospital.

## 2018-01-28 NOTE — Telephone Encounter (Signed)
"  I am calling you back.  I think we are playing phone tag yesterday.  I need a couple of things.  The home health care, I think they said that Medicare wouldn't pay for it.  I need to see if you can put it on my Medicaid, not my Medicare because I think they will pay for it.  Also Ms [name], I need a letter stating I'm having surgery and that I will be off my feet for about four weeks and that I'm going to need some additional help in my home.  Give me a call back if you want to or call and let me know you have taken care of it.  I'd appreciate it."

## 2018-02-01 ENCOUNTER — Other Ambulatory Visit: Payer: Self-pay | Admitting: Neurology

## 2018-02-01 ENCOUNTER — Encounter: Payer: Self-pay | Admitting: "Endocrinology

## 2018-02-01 DIAGNOSIS — M545 Low back pain: Principal | ICD-10-CM

## 2018-02-01 DIAGNOSIS — G8929 Other chronic pain: Secondary | ICD-10-CM

## 2018-02-02 NOTE — Telephone Encounter (Signed)
I received a call from the surgical center.  They said the implants were too expensive.  They asked if Dr. March Rummage could use something else.  If not, surgery would not be able to be done there at [facility].  Dr. March Rummage said he did not want to change the implant.  He wants surgery to be done at [facility].    I attempted to call the patient to let her know about the change.  I left her a message to call me back.  She will need a history and physical form completed before I can get her scheduled if she has not had a physical within the the past 30 days.  "This is [name].  I'm returning your call."  I was calling to let you know we have a glitch in our plans.  Your surgery will have to be at [facility].  The implant was too expensive that Dr. March Rummage wants to use for your surgery.  So, he wants to do it at [facility].  Are you okay with that?  "Yes, that's fine."  You will need a history and physical completed by your primary care doctor.  Have you had a physical within the past 30 days?  No, I have not.  I have seen him but not for a physical."  Can you come by the office to pick up the paperwork?  "Can you fax it to my doctor's office?  I have his fax number.  It's [name] at [phone]."  Okay, I will fax it.  I cannot schedule your surgery until you have an appointment set up or until you have those forms filled out.  "Okay, I will call my doctor's office here and [place] and see if I can get an appointment."  "I am calling you back to let you know that I have an appointment with [name] tomorrow.  I told her that you were going to fax the forms over."  Do you want to try and get the surgery scheduled for the same day?  "Yes, I would like to if I could because I have made arrangements to have it done."  I will see if I can get it scheduled.  I faxed the history and physical form to [name] office.

## 2018-02-03 ENCOUNTER — Encounter: Payer: Self-pay | Admitting: *Deleted

## 2018-02-03 DIAGNOSIS — E1165 Type 2 diabetes mellitus with hyperglycemia: Secondary | ICD-10-CM | POA: Diagnosis not present

## 2018-02-03 DIAGNOSIS — I10 Essential (primary) hypertension: Secondary | ICD-10-CM | POA: Diagnosis not present

## 2018-02-03 DIAGNOSIS — Z299 Encounter for prophylactic measures, unspecified: Secondary | ICD-10-CM | POA: Diagnosis not present

## 2018-02-03 DIAGNOSIS — Z794 Long term (current) use of insulin: Secondary | ICD-10-CM | POA: Diagnosis not present

## 2018-02-03 DIAGNOSIS — E118 Type 2 diabetes mellitus with unspecified complications: Secondary | ICD-10-CM | POA: Diagnosis not present

## 2018-02-03 DIAGNOSIS — Z713 Dietary counseling and surveillance: Secondary | ICD-10-CM | POA: Diagnosis not present

## 2018-02-03 DIAGNOSIS — Z6832 Body mass index (BMI) 32.0-32.9, adult: Secondary | ICD-10-CM | POA: Diagnosis not present

## 2018-02-03 LAB — COMPLETE METABOLIC PANEL WITH GFR
AG Ratio: 1.2 (calc) (ref 1.0–2.5)
ALT: 14 U/L (ref 6–29)
AST: 22 U/L (ref 10–35)
Albumin: 3.7 g/dL (ref 3.6–5.1)
Alkaline phosphatase (APISO): 92 U/L (ref 33–130)
BUN: 12 mg/dL (ref 7–25)
CO2: 27 mmol/L (ref 20–32)
Calcium: 8.9 mg/dL (ref 8.6–10.4)
Chloride: 107 mmol/L (ref 98–110)
Creat: 0.67 mg/dL (ref 0.50–1.05)
GFR, Est African American: 114 mL/min/{1.73_m2} (ref >=60)
GFR, Est Non African American: 98 mL/min/{1.73_m2} (ref >=60)
Globulin: 3 g/dL (calc) (ref 1.9–3.7)
Glucose, Bld: 93 mg/dL (ref 65–99)
Potassium: 3.9 mmol/L (ref 3.5–5.3)
Sodium: 141 mmol/L (ref 135–146)
Total Bilirubin: 0.3 mg/dL (ref 0.2–1.2)
Total Protein: 6.7 g/dL (ref 6.1–8.1)

## 2018-02-03 NOTE — Telephone Encounter (Signed)
I received the history and physical form from Dr. Cyndi Bender.  I faxed it to [place].    I also called the patient and informed her that the surgery was scheduled for 02/10/2018 at [facility].  "What am I going to do about the knee scooter?  My insurance would not cover it.  What does he suggest I use?"  He's not in the office today.  You may just have to use crutches.  I will write you a prescription for a wheelchair.  I need a note to give to my apartment complex letting them know I will have to have someone stay with me after I have my surgery."  I will write it, can you come by to pick it up.  I'm not coming to [place] until next week.  Can you mail it to me?"  Yes, I will mail it to you.  I called Tiffany with [facility] to make sure home health was arranged.  She said they would not be able to arrange for service 24/7.  She said she would call the patient and  make sure she understood.  I also sent an order to [place] to see if patient can get a wheelchair.  Patient's requested letter and prescription was mailed to her.

## 2018-02-04 ENCOUNTER — Encounter: Payer: Self-pay | Admitting: "Endocrinology

## 2018-02-04 ENCOUNTER — Ambulatory Visit (INDEPENDENT_AMBULATORY_CARE_PROVIDER_SITE_OTHER): Payer: Medicare Other | Admitting: "Endocrinology

## 2018-02-04 VITALS — BP 139/77 | HR 77 | Ht 62.0 in | Wt 180.0 lb

## 2018-02-04 DIAGNOSIS — E1165 Type 2 diabetes mellitus with hyperglycemia: Secondary | ICD-10-CM | POA: Diagnosis not present

## 2018-02-04 DIAGNOSIS — IMO0002 Reserved for concepts with insufficient information to code with codable children: Secondary | ICD-10-CM

## 2018-02-04 DIAGNOSIS — E118 Type 2 diabetes mellitus with unspecified complications: Secondary | ICD-10-CM

## 2018-02-04 DIAGNOSIS — Z794 Long term (current) use of insulin: Secondary | ICD-10-CM | POA: Diagnosis not present

## 2018-02-04 DIAGNOSIS — E782 Mixed hyperlipidemia: Secondary | ICD-10-CM | POA: Diagnosis not present

## 2018-02-04 DIAGNOSIS — I10 Essential (primary) hypertension: Secondary | ICD-10-CM | POA: Diagnosis not present

## 2018-02-04 LAB — HEMOGLOBIN A1C
EAG (MMOL/L): 12.2 (calc)
Hgb A1c MFr Bld: 9.3 % of total Hgb — ABNORMAL HIGH (ref <5.7)
MEAN PLASMA GLUCOSE: 220 (calc)

## 2018-02-04 NOTE — Progress Notes (Signed)
Subjective:    Patient ID: [name], female    DOB: May 02, 1961. Patient is being seen in f/u for management of diabetes requested by  Glenda Chroman, MD  Past Medical History:  Diagnosis Date  . Anxiety disorder   . Chronic low back pain   . Cirrhosis of liver ([place])   . Colitis   . Diabetes mellitus   . Diabetic neuropathy ([place])   . Diverticulitis   . Fatty liver   . GERD (gastroesophageal reflux disease)   . High cholesterol   . Hypertension   . Irritable bowel syndrome   . Meniere's disease   . Neuropathy   . Pain    Past Surgical History:  Procedure Laterality Date  . ABDOMINAL HYSTERECTOMY    . BIOPSY  03/20/2017   Procedure: BIOPSY;  Surgeon: [name], MD;  [facility];  Service: Endoscopy;;  colon gastric  . bladder tact  2005  . CHOLECYSTECTOMY  08/11  . COLONOSCOPY  2208  . COLONOSCOPY  In of September 2014   @ [facility]/Dr,.[name]  . COLONOSCOPY WITH PROPOFOL N/A 03/20/2017   Procedure: COLONOSCOPY WITH PROPOFOL;  Surgeon: [name], MD;  Location: [facility];  Service: Endoscopy;  Laterality: N/A;  . ECTOPIC PREGNANCY SURGERY  1996  . ESOPHAGOGASTRODUODENOSCOPY (EGD) WITH PROPOFOL N/A 03/20/2017   Procedure: ESOPHAGOGASTRODUODENOSCOPY (EGD) WITH PROPOFOL;  Surgeon: [name], MD;  Location[facility];  Service: Endoscopy;  Laterality: N/A;  12:45  . HERNIA REPAIR  09/17/11  . [place]  2009   Preformed on the right shoulder  . OVARIAN CYST REMOVAL     right side  . PARTIAL HYSTERECTOMY  2005  . POLYPECTOMY  03/20/2017   Procedure: POLYPECTOMY;  Surgeon: [name], MD;  Location[facility];  Service: Endoscopy;;  colon  . UPPER GASTROINTESTINAL ENDOSCOPY  2008  . URETERAL STENT PLACEMENT     Social History   Socioeconomic History  . Marital status: Divorced    Spouse name: Not on file  . Number of children: Not on file  . Years of education: Not on file  . Highest education level: Not on file   Occupational History  . Not on file  Social Needs  . Financial resource strain: Not on file  . Food insecurity:    Worry: Not on file    Inability: Not on file  . Transportation needs:    Medical: Not on file    Non-medical: Not on file  Tobacco Use  . Smoking status: Never Smoker  . Smokeless tobacco: Never Used  Substance and Sexual Activity  . Alcohol use: No  . Drug use: No  . Sexual activity: Yes    Birth control/protection: Surgical  Lifestyle  . Physical activity:    Days per week: Not on file    Minutes per session: Not on file  . Stress: Not on file  Relationships  . Social connections:    Talks on phone: Not on file    Gets together: Not on file    Attends religious service: Not on file    Active member of club or organization: Not on file    Attends meetings of clubs or organizations: Not on file    Relationship status: Not on file  Other Topics Concern  . Not on file  Social History Narrative  . Not on file   Outpatient Encounter Medications as of 02/04/2018  Medication Sig  .  acetaZOLAMIDE (DIAMOX) 250 MG tablet Take 250 mg by mouth daily.   Marland Kitchen acyclovir (ZOVIRAX) 400 MG tablet Take 1 tablet by mouth 3 (three) times daily as needed (fever blisters).   Marland Kitchen atorvastatin (LIPITOR) 20 MG tablet TAKE ONE TABLET BY MOUTH DAILY.  Marland Kitchen Blood Glucose Monitoring Suppl (FREESTYLE LITE) DEVI Used to monitor blood glucose.  . Cholecalciferol (VITAMIN D3 PO) Take 4,000 Units by mouth daily.   . ciclopirox (PENLAC) 8 % solution Apply topically at bedtime. Apply over nail and surrounding skin. Apply daily over previous coat. Remove weekly with file or polish remover.  . Continuous Blood Gluc Sensor (FREESTYLE LIBRE SENSOR SYSTEM) MISC Use one sensor every 10 days.  . cyanocobalamin (,VITAMIN B-12,) 1000 MCG/ML injection Inject 1,000 mcg into the muscle every 30 (thirty) days.  . diazepam (VALIUM) 2 MG tablet Take 2 mg by mouth every 12 (twelve) hours as needed for anxiety.   .  dicyclomine (BENTYL) 20 MG tablet Take 1 tablet (20 mg total) by mouth every 8 (eight) hours as needed for spasms.  . DULoxetine (CYMBALTA) 60 MG capsule Take 60 mg by mouth daily.  Marland Kitchen estradiol (ESTRACE) 0.5 MG tablet Take 0.5 mg by mouth every evening.   . ferrous sulfate 325 (65 FE) MG tablet Take 1 tablet (325 mg total) by mouth daily. (Patient taking differently: Take 325 mg by mouth every 3 (three) days. )  . fluticasone (FLONASE) 50 MCG/ACT nasal spray Place 2 sprays into both nostrils every morning.   Marland Kitchen glucose blood (FREESTYLE LITE) test strip Use as instructed  . metFORMIN (GLUCOPHAGE) 1000 MG tablet TAKE ONE TABLET BY MOUTH TWICE DAILY WITH A MEAL  . metoCLOPramide (REGLAN) 10 MG tablet TAKE ONE TABLET BY MOUTH AT BEDTIME (Patient not taking: Reported on 10/13/2017)  . metoprolol (LOPRESSOR) 50 MG tablet Take 25 mg by mouth daily. Patient takes 1/2 tablet daily  . Multiple Vitamins-Minerals (WOMENS MULTI PO) Take 1 tablet by mouth daily.   . ondansetron (ZOFRAN) 4 MG tablet Take 1 tablet (4 mg total) by mouth every 8 (eight) hours as needed for nausea or vomiting.  Marland Kitchen oxyCODONE-acetaminophen (PERCOCET/ROXICET) 5-325 MG tablet Take 1-2 tablets by mouth daily as needed for moderate pain.   . pantoprazole (PROTONIX) 40 MG tablet TAKE ONE TABLET BY MOUTH DAILY (STOP OMEPRAZOLE)  . PARoxetine (PAXIL) 10 MG tablet Take 10 mg by mouth daily.   Marland Kitchen PRODIGY TWIST TOP LANCETS 28G MISC TEST TWICE DAILY  . tiZANidine (ZANAFLEX) 4 MG capsule Take 4 mg as needed by mouth for muscle spasms.  . traMADol (ULTRAM) 50 MG tablet Take 50 mg by mouth every 8 (eight) hours as needed.   . TRESIBA FLEXTOUCH 200 UNIT/ML SOPN INJECT 60 UNITS AT BEDTIME.  . Turmeric 500 MG CAPS Take 1 capsule by mouth daily.    No facility-administered encounter medications on file as of 02/04/2018.    ALLERGIES: Allergies  Allergen Reactions  . Flagyl [Metronidazole Hcl] Itching and Rash  . Nsaids     nausea   VACCINATION  STATUS:  There is no immunization history on file for this patient.  Diabetes  She presents for her follow-up diabetic visit. She has type 2 diabetes mellitus. Onset time: She was diagnosed approximately at age 76 years. Her disease course has been improving. There are no hypoglycemic associated symptoms. Pertinent negatives for hypoglycemia include no confusion, headaches, pallor or seizures. Associated symptoms include fatigue. Pertinent negatives for diabetes include no chest pain, no polydipsia, no polyphagia and  no polyuria. There are no hypoglycemic complications. Symptoms are improving. There are no diabetic complications. Risk factors for coronary artery disease include diabetes mellitus, dyslipidemia, hypertension, obesity, sedentary lifestyle and family history. Current diabetic treatment includes oral agent (monotherapy) and intensive insulin program (She is on Tresiba 50 units  subcutaneously daily at bedtime, metformin 1000 g by mouth twice a day. ). Her weight is fluctuating minimally. She is following a generally unhealthy diet. When asked about meal planning, she reported none. She has not had a previous visit with a dietitian. She never participates in exercise. Her home blood glucose trend is fluctuating minimally. Her breakfast blood glucose range is generally 130-140 mg/dl. Her dinner blood glucose range is generally 140-180 mg/dl. Her overall blood glucose range is 140-180 mg/dl. An ACE inhibitor/angiotensin II receptor blocker is not being taken. Eye exam is current.  Hypertension  This is a chronic problem. The current episode started more than 1 year ago. The problem is controlled. Pertinent negatives include no chest pain, headaches, palpitations or shortness of breath. Risk factors for coronary artery disease include diabetes mellitus, obesity and sedentary lifestyle. Past treatments include beta blockers. Compliance problems include diet and exercise.   Hyperlipidemia  This is a  chronic problem. The current episode started more than 1 year ago. The problem is controlled. Exacerbating diseases include diabetes and obesity. Pertinent negatives include no chest pain, myalgias or shortness of breath. Current antihyperlipidemic treatment includes statins. Risk factors for coronary artery disease include diabetes mellitus, dyslipidemia, obesity, a sedentary lifestyle and post-menopausal.    Review of Systems  Constitutional: Positive for fatigue. Negative for unexpected weight change.  HENT: Negative for trouble swallowing and voice change.   Eyes: Negative for visual disturbance.  Respiratory: Negative for cough, shortness of breath and wheezing.   Cardiovascular: Negative for chest pain, palpitations and leg swelling.  Gastrointestinal: Negative for diarrhea, nausea and vomiting.  Endocrine: Negative for cold intolerance, heat intolerance, polydipsia, polyphagia and polyuria.  Genitourinary: Negative for dysuria, flank pain and frequency.  Musculoskeletal: Negative for arthralgias and myalgias.  Skin: Negative for color change, pallor, rash and wound.  Neurological: Negative for seizures and headaches.  Psychiatric/Behavioral: Negative for confusion and suicidal ideas.    Objective:    BP 139/77   Pulse 77   Ht 5' 2"  (1.575 m)   Wt 180 lb (81.6 kg)   BMI 32.92 kg/m   Wt Readings from Last 3 Encounters:  02/04/18 180 lb (81.6 kg)  01/12/18 178 lb (80.7 kg)  10/13/17 185 lb (83.9 kg)    Physical Exam  Constitutional: She is oriented to person, place, and time. She appears well-developed.  Obese.  HENT:  Head: Normocephalic and atraumatic.  Eyes: EOM are normal.  Neck: Normal range of motion. Neck supple. No tracheal deviation present. No thyromegaly present.  Cardiovascular: Normal rate.  Pulmonary/Chest: Effort normal.  Abdominal: There is no tenderness. There is no guarding.  Musculoskeletal: Normal range of motion. She exhibits no edema.  Neurological:  She is alert and oriented to person, place, and time. She has normal reflexes. No cranial nerve deficit. Coordination normal.  Skin: Skin is warm and dry. No rash noted. No erythema. No pallor.  Psychiatric: She has a normal mood and affect. Judgment normal.    CMP     Component Value Date/Time   NA 141 02/03/2018 0808   K 3.9 02/03/2018 0808   CL 107 02/03/2018 0808   CO2 27 02/03/2018 0808   GLUCOSE 93 02/03/2018 0808  BUN 12 02/03/2018 0808   CREATININE 0.67 02/03/2018 0808   CALCIUM 8.9 02/03/2018 0808   PROT 6.7 02/03/2018 0808   ALBUMIN 3.4 (L) 10/13/2017 1013   AST 22 02/03/2018 0808   ALT 14 02/03/2018 0808   ALKPHOS 86 10/13/2017 1013   BILITOT 0.3 02/03/2018 0808   GFRNONAA 98 02/03/2018 0808   GFRAA 114 02/03/2018 0808     Diabetic Labs (most recent): Lab Results  Component Value Date   HGBA1C 9.3 (H) 02/03/2018   HGBA1C 9.7 (H) 10/13/2017   HGBA1C 8.7 (H) 07/20/2017   Lipid Panel     Component Value Date/Time   CHOL 146 10/13/2017 0816   TRIG 90 10/13/2017 0816   HDL 49 (L) 10/13/2017 0816   CHOLHDL 3.0 10/13/2017 0816   VLDL 29 06/30/2016 1251   LDLCALC 80 10/13/2017 0816     Assessment & Plan:   1. Uncontrolled type 2 diabetes mellitus with complication, with long-term current use of insulin ([place]) - Patient has currently uncontrolled symptomatic type 2 DM since  57 years of age. -Her previsit labs show A1c of 9.3%, improving from last visit.   Recent labs reviewed.   Her diabetes is complicated by obesity, insulin resistance, and patient remains at a high risk for more acute and chronic complications of diabetes which include CAD, CVA, CKD, retinopathy, and neuropathy. These are all discussed in detail with the patient.  - I have counseled the patient on diet management and weight loss, by adopting a carbohydrate restricted/protein rich diet.  -  Suggestion is made for her to avoid simple carbohydrates  from her diet including Cakes, Sweet  Desserts / Pastries, Ice Cream, Soda (diet and regular), Sweet Tea, Candies, Chips, Cookies, Store Bought Juices, Alcohol in Excess of  1-2 drinks a day, Artificial Sweeteners, and "Sugar-free" Products. This will help patient to have stable blood glucose profile and potentially avoid unintended weight gain.  - I encouraged the patient to switch to  unprocessed or minimally processed complex starch and increased protein intake (animal or plant source), fruits, and vegetables.  - Patient is advised to stick to a routine mealtimes to eat 3 meals  a day and avoid unnecessary snacks ( to snack only to correct hypoglycemia).  - She declines referral to CDE for now.  - I have approached patient with the following individualized plan to manage diabetes and patient agrees:   -  Based on her recent glycemic response, she will not require prandial insulin for now.   - I advised her to continue Tresiba 60 units nightly,  associated with strict monitoring of blood glucose 2 times a day-before breakfast and at bedtime.   -  (she  previously took 130 units of basal insulin and 45 units of prandial insulin daily). - Continue to hold prandial insulin for now. - She will continue to monitor blood glucose at least 2 times a day-daily before breakfast and at bedtime.  -Patient is encouraged to call clinic for blood glucose levels less than 70 or above 200 mg /dl. - I will continue metformin  1000 mg by mouth twice a day ,therapeutically suitable for patient. -   her insurance declines to cover for continuous glucose monitoring device.   -  She has history of intolerance for [place], likely would not tolerate other  incretin therapy.  - Patient specific target  A1c;  LDL, HDL, Triglycerides, and  Waist Circumference were discussed in detail.  2) BP/HTN: Her blood pressure is controlled to  target.    She is advised to continue her current medications.  3) Lipids/HPL: Her recent lab showed controlled LDL of 80,  generally improving from 141.   I  advised her to continue atorvastatin 20 mg by mouth daily at bedtime. Side effects and precautions discussed with her.  4)  Weight/Diet: She has declined CD consult. exercise, and detailed carbohydrates information provided.  5) Chronic Care/Health Maintenance:  -Patient  will be considered for ACE inhibitor's and statins as appropriate, and encouraged to continue to follow up with Ophthalmology, Podiatrist at least yearly or according to recommendations, and advised to   stay away from smoking. I have recommended yearly flu vaccine and pneumonia vaccination at least every 5 years; moderate intensity exercise for up to 150 minutes weekly; and  sleep for at least 7 hours a day.  - I advised patient to maintain close follow up with Glenda Chroman, MD for primary care needs. - Time spent with the patient: 25 min, of which >50% was spent in reviewing her blood glucose logs , discussing her hypo- and hyper-glycemic episodes, reviewing her current and  previous labs and insulin doses and developing a plan to avoid hypo- and hyper-glycemia. Please refer to Patient Instructions for Blood Glucose Monitoring and Insulin/Medications Dosing Guide"  in media tab for additional information. [name] participated in the discussions, expressed understanding, and voiced agreement with the above plans.  All questions were answered to her satisfaction. she is encouraged to contact clinic should she have any questions or concerns prior to her return visit.   Follow up plan: - Return in about 3 months (around 05/07/2018) for follow up with pre-visit labs, meter, and logs.  Glade Lloyd, MD Phone: [phone]  Fax: [phone]  This note was partially dictated with voice recognition software. Similar sounding words can be transcribed inadequately or may not  be corrected upon review.  02/04/2018, 1:34 PM

## 2018-02-04 NOTE — Patient Instructions (Signed)

## 2018-02-05 ENCOUNTER — Telehealth: Payer: Self-pay | Admitting: *Deleted

## 2018-02-05 NOTE — Telephone Encounter (Signed)
"  Did Dr. March Rummage write me any other prescriptions other than for the wheelchair?"  He did not.  "He had mentioned giving me one for a bedside toilet.  I think I might still need the knee scooter because I live up a flight of stairs to get to my apartment.  I talked to Tiffany from [facility], she said Medicaid might pay for the scooter.  You will have to get it authorized by Medicaid.  I cannot do it, it has to be done by you all."  I'll see what I can do."

## 2018-02-05 NOTE — Addendum Note (Signed)
Addended by: Lolita Rieger on: 02/05/2018 02:31 PM   Modules accepted: Orders

## 2018-02-08 ENCOUNTER — Encounter (HOSPITAL_COMMUNITY): Payer: Self-pay

## 2018-02-08 ENCOUNTER — Other Ambulatory Visit: Payer: Self-pay

## 2018-02-08 DIAGNOSIS — H2513 Age-related nuclear cataract, bilateral: Secondary | ICD-10-CM | POA: Diagnosis not present

## 2018-02-08 DIAGNOSIS — H40011 Open angle with borderline findings, low risk, right eye: Secondary | ICD-10-CM | POA: Diagnosis not present

## 2018-02-08 DIAGNOSIS — H40012 Open angle with borderline findings, low risk, left eye: Secondary | ICD-10-CM | POA: Diagnosis not present

## 2018-02-08 DIAGNOSIS — H53022 Refractive amblyopia, left eye: Secondary | ICD-10-CM | POA: Diagnosis not present

## 2018-02-08 NOTE — Telephone Encounter (Signed)
"  I received the envelope but the prescription was not in there.  The envelope was marked damaged.  Was there anything else in it?"  Your letter was in there that you requested.  I sent your prescription to [facility] for the wheelchair so you should be okay.  "Have you called Medicaid about the other, knee scooter?"  I have not called Medicaid.

## 2018-02-08 NOTE — Addendum Note (Signed)
Addended by: Lolita Rieger on: 02/08/2018 03:55 PM   Modules accepted: Orders

## 2018-02-08 NOTE — Telephone Encounter (Signed)
I put in another order for the wheelchair.  I had to add add certain criteria, per [place], in order for patient's insurance to cover the wheelchair.

## 2018-02-08 NOTE — Progress Notes (Signed)
PCP - Dr. Jerene Bears  Cardiologist - Denies  Chest x-ray - Denies  EKG - 02/10/18  Stress Test - Denies  ECHO - Denies  Cardiac Cath - Denies  Sleep Study - Denies CPAP - None  LABS- 02/03/18: CMP  ASA-Denies  HA1C- 02/03/18: 9.3 Fasting Blood Sugar -  Checks Blood Sugar _2____ times a day  Anesthesia- Yes- abnormal labs  Pt denies having chest pain, sob, or fever during pre-op phone call. All instructions explained to the pt, including as of today, stop taking all Aspirins, Vitamins, Fish oils, and Herbal medications. Also stop all NSAIDS i.e. Advil, Ibuprofen, Motrin, Aleve, Anaprox, Naproxen, BC and Goody Powders. The opportunity to ask questions was provided. Pt also given these instructions:  How to Manage Your Diabetes Before and After Surgery  Why is it important to control my blood sugar before and after surgery? . Improving blood sugar levels before and after surgery helps healing and can limit problems. . A way of improving blood sugar control is eating a healthy diet by: o  Eating less sugar and carbohydrates o  Increasing activity/exercise o  Talking with your doctor about reaching your blood sugar goals . High blood sugars (greater than 180 mg/dL) can raise your risk of infections and slow your recovery, so you will need to focus on controlling your diabetes during the weeks before surgery. . Make sure that the doctor who takes care of your diabetes knows about your planned surgery including the date and location.  How do I manage my blood sugar before surgery? . Check your blood sugar at least 4 times a day, starting 2 days before surgery, to make sure that the level is not too high or low. o Check your blood sugar the morning of your surgery when you wake up and every 2 hours until you get to the Short Stay unit. . If your blood sugar is less than 70 mg/dL, you will need to treat for low blood sugar: o Do not take insulin. o Treat a low blood sugar (less than 70  mg/dL) with  cup of clear juice (cranberry or apple), 4 glucose tablets, OR glucose gel. Recheck blood sugar in 15 minutes after treatment (to make sure it is greater than 70 mg/dL). If your blood sugar is not greater than 70 mg/dL on recheck, call [phone] o  for further instructions. . Report your blood sugar to the short stay nurse when you get to Short Stay.  . If you are admitted to the hospital after surgery: o Your blood sugar will be checked by the staff and you will probably be given insulin after surgery (instead of oral diabetes medicines) to make sure you have good blood sugar levels. o The goal for blood sugar control after surgery is 80-180 mg/dL.  WHAT DO I DO ABOUT MY DIABETES MEDICATION?  Marland Kitchen Do not take MetFORMIN (GLUCOPHAGE) the morning of surgery.  . The day of surgery, do not take other diabetes injectables, including TRESIBA   . If your CBG is greater than 220 mg/dL, call us at [phone]  Reviewed and Endorsed by [facility] Patient Education Committee, August 2015

## 2018-02-09 ENCOUNTER — Other Ambulatory Visit: Payer: Self-pay | Admitting: Neurology

## 2018-02-09 ENCOUNTER — Ambulatory Visit
Admission: RE | Admit: 2018-02-09 | Discharge: 2018-02-09 | Disposition: A | Discharge status: Home / Self Care | Payer: Medicare Other | Source: Ambulatory Visit | Attending: Neurology | Admitting: Neurology

## 2018-02-09 DIAGNOSIS — G8929 Other chronic pain: Secondary | ICD-10-CM

## 2018-02-09 DIAGNOSIS — M545 Low back pain: Principal | ICD-10-CM

## 2018-02-09 DIAGNOSIS — M47817 Spondylosis without myelopathy or radiculopathy, lumbosacral region: Secondary | ICD-10-CM | POA: Diagnosis not present

## 2018-02-09 MED ORDER — IOPAMIDOL (ISOVUE-M 200) INJECTION 41%
1.0000 mL | Freq: Once | INTRAMUSCULAR | Status: AC
  Administered 2018-02-09: 1 mL via EPIDURAL

## 2018-02-09 MED ORDER — METHYLPREDNISOLONE ACETATE 40 MG/ML INJ SUSP (RADIOLOG
120.0000 mg | Freq: Once | INTRAMUSCULAR | Status: AC
  Administered 2018-02-09: 120 mg via EPIDURAL

## 2018-02-09 NOTE — Progress Notes (Signed)
Anesthesia Chart Review:  Pt is a same day work up   Case:  [id number] Date/Time:  02/10/18 1100   Procedures:      PERONEAL TENDON REPAIR (Left )     ANKLE LIGAMENT REPAIR SUPPLEMENTATION (Left )   Anesthesia type:  Monitor Anesthesia Care   Pre-op diagnosis:  TENDON TEAR LEFT FOOT   Location:  [facility] OR ROOM 12 / [place] [facility]   Surgeon:  [name], DPM      DISCUSSION:  - Pt is a 57 year old female with uncontrolled DM, HTN, cirrhosis - Has H&P from Arsenio Katz, NP dated 02/03/18   VS: Ht 5' 2"  (1.575 m)   Wt 179 lb (81.2 kg)   BMI 32.74 kg/m    PROVIDERS: Jerene Bears B, MD   LABS: Will be obtained day of surgery  - HbA1c was 9.3 on 02/03/18   EKG 04/07/17 (PCP's office):  NSR    Past Medical History:  Diagnosis Date  . Anxiety disorder   . Chronic low back pain   . Cirrhosis of liver ([place])   . Colitis   . Diabetes mellitus    Type II  . Diabetic neuropathy ([place])   . Diverticulitis   . Fatty liver   . GERD (gastroesophageal reflux disease)   . High cholesterol   . Hypertension   . Irritable bowel syndrome   . Meniere's disease   . Neuropathy   . Pain     Past Surgical History:  Procedure Laterality Date  . ABDOMINAL HYSTERECTOMY    . BIOPSY  03/20/2017   Procedure: BIOPSY;  Surgeon: [name], MD;  Location: [facility];  Service: Endoscopy;;  colon gastric  . bladder tact  2005  . CHOLECYSTECTOMY  08/11  . COLONOSCOPY  2208  . COLONOSCOPY  In of September 2014   @ [facility]/Dr,.[name]  . COLONOSCOPY WITH PROPOFOL N/A 03/20/2017   Procedure: COLONOSCOPY WITH PROPOFOL;  Surgeon: [name], MD;  Location: [facility];  Service: Endoscopy;  Laterality: N/A;  . ECTOPIC PREGNANCY SURGERY  1996  . ESOPHAGOGASTRODUODENOSCOPY (EGD) WITH PROPOFOL N/A 03/20/2017   Procedure: ESOPHAGOGASTRODUODENOSCOPY (EGD) WITH PROPOFOL;  Surgeon: [name], MD;  Location: [facility];  Service: Endoscopy;  Laterality: N/A;  12:45  . HERNIA REPAIR  09/17/11  .  [place]  2009   Preformed on the right shoulder  . OVARIAN CYST REMOVAL     right side  . PARTIAL HYSTERECTOMY  2005  . POLYPECTOMY  03/20/2017   Procedure: POLYPECTOMY;  Surgeon: [name], MD;  Location: [facility];  Service: Endoscopy;;  colon  . UPPER GASTROINTESTINAL ENDOSCOPY  2008  . URETERAL STENT PLACEMENT      MEDICATIONS: No current facility-administered medications for this encounter.    Marland Kitchen acetaZOLAMIDE (DIAMOX) 250 MG tablet  . acyclovir (ZOVIRAX) 400 MG tablet  . atorvastatin (LIPITOR) 20 MG tablet  . Cholecalciferol (VITAMIN D3) 2000 units capsule  . ciclopirox (PENLAC) 8 % solution  . cyanocobalamin (,VITAMIN B-12,) 1000 MCG/ML injection  . cyclobenzaprine (FLEXERIL) 10 MG tablet  . diazepam (VALIUM) 2 MG tablet  . dicyclomine (BENTYL) 20 MG tablet  . DULoxetine (CYMBALTA) 60 MG capsule  . estradiol (ESTRACE) 0.5 MG tablet  . ferrous sulfate 325 (65 FE) MG tablet  . fluticasone (FLONASE) 50 MCG/ACT nasal spray  . metFORMIN (GLUCOPHAGE) 1000 MG tablet  . metoprolol (LOPRESSOR) 50 MG tablet  . Multiple Vitamins-Minerals (WOMENS MULTI PO)  . ondansetron (  ZOFRAN) 4 MG tablet  . oxyCODONE-acetaminophen (PERCOCET/ROXICET) 5-325 MG tablet  . pantoprazole (PROTONIX) 40 MG tablet  . PARoxetine (PAXIL) 40 MG tablet  . Polyethyl Glycol-Propyl Glycol (SYSTANE OP)  . tiZANidine (ZANAFLEX) 4 MG capsule  . TRESIBA FLEXTOUCH 200 UNIT/ML SOPN  . Turmeric 500 MG CAPS  . Blood Glucose Monitoring Suppl (FREESTYLE LITE) DEVI  . Continuous Blood Gluc Sensor ([place]) MISC  . glucose blood (FREESTYLE LITE) test strip  . metoCLOPramide (REGLAN) 10 MG tablet  . PRODIGY TWIST TOP LANCETS 28G MISC    If labs acceptable day of surgery, I anticipate pt can proceed with surgery as scheduled.  Willeen Cass, FNP-BC [facility]/Anesthesiology Phone: [phone] 02/09/2018 11:58 AM

## 2018-02-10 ENCOUNTER — Encounter: Payer: Self-pay | Admitting: Podiatry

## 2018-02-10 ENCOUNTER — Encounter (HOSPITAL_COMMUNITY): Payer: Self-pay | Admitting: Urology

## 2018-02-10 ENCOUNTER — Ambulatory Visit (HOSPITAL_COMMUNITY): Payer: Medicare Other | Admitting: Vascular Surgery

## 2018-02-10 ENCOUNTER — Encounter (HOSPITAL_COMMUNITY)
Admission: RE | Disposition: A | Discharge status: Home / Self Care | Payer: Self-pay | Source: Ambulatory Visit | Attending: Podiatry

## 2018-02-10 ENCOUNTER — Ambulatory Visit (HOSPITAL_COMMUNITY)
Admission: RE | Admit: 2018-02-10 | Discharge: 2018-02-10 | Disposition: A | Discharge status: Home / Self Care | Payer: Medicare Other | Source: Ambulatory Visit | Attending: Podiatry | Admitting: Podiatry

## 2018-02-10 DIAGNOSIS — Z79891 Long term (current) use of opiate analgesic: Secondary | ICD-10-CM | POA: Insufficient documentation

## 2018-02-10 DIAGNOSIS — M25372 Other instability, left ankle: Secondary | ICD-10-CM | POA: Diagnosis not present

## 2018-02-10 DIAGNOSIS — S86319A Strain of muscle(s) and tendon(s) of peroneal muscle group at lower leg level, unspecified leg, initial encounter: Secondary | ICD-10-CM

## 2018-02-10 DIAGNOSIS — S93492S Sprain of other ligament of left ankle, sequela: Secondary | ICD-10-CM

## 2018-02-10 DIAGNOSIS — M7672 Peroneal tendinitis, left leg: Secondary | ICD-10-CM | POA: Diagnosis not present

## 2018-02-10 DIAGNOSIS — Z79890 Hormone replacement therapy (postmenopausal): Secondary | ICD-10-CM | POA: Diagnosis not present

## 2018-02-10 DIAGNOSIS — Z79899 Other long term (current) drug therapy: Secondary | ICD-10-CM | POA: Insufficient documentation

## 2018-02-10 DIAGNOSIS — K219 Gastro-esophageal reflux disease without esophagitis: Secondary | ICD-10-CM | POA: Insufficient documentation

## 2018-02-10 DIAGNOSIS — K589 Irritable bowel syndrome without diarrhea: Secondary | ICD-10-CM | POA: Insufficient documentation

## 2018-02-10 DIAGNOSIS — S93492A Sprain of other ligament of left ankle, initial encounter: Secondary | ICD-10-CM | POA: Diagnosis not present

## 2018-02-10 DIAGNOSIS — E1140 Type 2 diabetes mellitus with diabetic neuropathy, unspecified: Secondary | ICD-10-CM | POA: Insufficient documentation

## 2018-02-10 DIAGNOSIS — Z7951 Long term (current) use of inhaled steroids: Secondary | ICD-10-CM | POA: Insufficient documentation

## 2018-02-10 DIAGNOSIS — I10 Essential (primary) hypertension: Secondary | ICD-10-CM | POA: Insufficient documentation

## 2018-02-10 DIAGNOSIS — S86312D Strain of muscle(s) and tendon(s) of peroneal muscle group at lower leg level, left leg, subsequent encounter: Secondary | ICD-10-CM | POA: Diagnosis not present

## 2018-02-10 DIAGNOSIS — S86312A Strain of muscle(s) and tendon(s) of peroneal muscle group at lower leg level, left leg, initial encounter: Secondary | ICD-10-CM | POA: Diagnosis not present

## 2018-02-10 DIAGNOSIS — G8918 Other acute postprocedural pain: Secondary | ICD-10-CM | POA: Diagnosis not present

## 2018-02-10 DIAGNOSIS — E7800 Pure hypercholesterolemia, unspecified: Secondary | ICD-10-CM | POA: Insufficient documentation

## 2018-02-10 DIAGNOSIS — M659 Unspecified synovitis and tenosynovitis, unspecified site: Secondary | ICD-10-CM

## 2018-02-10 DIAGNOSIS — M65872 Other synovitis and tenosynovitis, left ankle and foot: Secondary | ICD-10-CM | POA: Diagnosis not present

## 2018-02-10 DIAGNOSIS — X58XXXA Exposure to other specified factors, initial encounter: Secondary | ICD-10-CM | POA: Insufficient documentation

## 2018-02-10 DIAGNOSIS — F419 Anxiety disorder, unspecified: Secondary | ICD-10-CM | POA: Insufficient documentation

## 2018-02-10 DIAGNOSIS — Z7984 Long term (current) use of oral hypoglycemic drugs: Secondary | ICD-10-CM | POA: Diagnosis not present

## 2018-02-10 HISTORY — PX: TENDON REPAIR: SHX5111

## 2018-02-10 HISTORY — PX: LIGAMENT REPAIR: SHX5444

## 2018-02-10 LAB — GLUCOSE, CAPILLARY
GLUCOSE-CAPILLARY: 222 mg/dL — AB (ref 65–99)
Glucose-Capillary: 304 mg/dL — ABNORMAL HIGH (ref 65–99)
Glucose-Capillary: 290 mg/dL — ABNORMAL HIGH (ref 65–99)
Glucose-Capillary: 225 mg/dL — ABNORMAL HIGH (ref 65–99)
Glucose-Capillary: 238 mg/dL — ABNORMAL HIGH (ref 65–99)

## 2018-02-10 LAB — CBC
HEMATOCRIT: 39.9 % (ref 36.0–46.0)
Hemoglobin: 13.4 g/dL (ref 12.0–15.0)
MCH: 30.9 pg (ref 26.0–34.0)
MCHC: 33.6 g/dL (ref 30.0–36.0)
MCV: 92.1 fL (ref 78.0–100.0)
PLATELETS: 189 10*3/uL (ref 150–400)
RBC: 4.33 MIL/uL (ref 3.87–5.11)
RDW: 13.6 % (ref 11.5–15.5)
WBC: 13.9 10*3/uL — AB (ref 4.0–10.5)

## 2018-02-10 SURGERY — TENDON REPAIR
Anesthesia: General | Site: Ankle | Laterality: Left

## 2018-02-10 MED ORDER — BUPIVACAINE HCL (PF) 0.5 % IJ SOLN
INTRAMUSCULAR | Status: DC | PRN
  Administered 2018-02-10: 1 mL

## 2018-02-10 MED ORDER — PROMETHAZINE HCL 25 MG PO TABS: 25.0000 mg | ORAL_TABLET | Freq: Three times a day (TID) | ORAL | 0 refills | Status: DC | PRN

## 2018-02-10 MED ORDER — PROPOFOL 10 MG/ML IV BOLUS
INTRAVENOUS | Status: DC | PRN
  Administered 2018-02-10: 200 mg via INTRAVENOUS

## 2018-02-10 MED ORDER — BUPIVACAINE-EPINEPHRINE (PF) 0.5% -1:200000 IJ SOLN
INTRAMUSCULAR | Status: DC | PRN
  Administered 2018-02-10 (×2): 20 mL via PERINEURAL

## 2018-02-10 MED ORDER — SCOPOLAMINE 1 MG/3DAYS TD PT72
MEDICATED_PATCH | TRANSDERMAL | Status: AC
  Filled 2018-02-10: qty 1

## 2018-02-10 MED ORDER — MIDAZOLAM HCL 5 MG/5ML IJ SOLN
INTRAMUSCULAR | Status: DC | PRN
  Administered 2018-02-10: 2 mg via INTRAVENOUS

## 2018-02-10 MED ORDER — LABETALOL HCL 5 MG/ML IV SOLN
5.0000 mg | Freq: Once | INTRAVENOUS | Status: AC
  Administered 2018-02-10: 5 mg via INTRAVENOUS

## 2018-02-10 MED ORDER — LACTATED RINGERS IV SOLN
INTRAVENOUS | Status: DC
  Administered 2018-02-10: 10:00:00 via INTRAVENOUS

## 2018-02-10 MED ORDER — SUGAMMADEX SODIUM 200 MG/2ML IV SOLN
INTRAVENOUS | Status: AC
  Filled 2018-02-10: qty 2

## 2018-02-10 MED ORDER — ONDANSETRON HCL 4 MG/2ML IJ SOLN
INTRAMUSCULAR | Status: DC | PRN
  Administered 2018-02-10: 4 mg via INTRAVENOUS

## 2018-02-10 MED ORDER — HYDROMORPHONE HCL 2 MG/ML IJ SOLN
INTRAMUSCULAR | Status: AC
  Administered 2018-02-10: 0.5 mg via INTRAVENOUS
  Filled 2018-02-10: qty 1

## 2018-02-10 MED ORDER — PROPOFOL 500 MG/50ML IV EMUL
INTRAVENOUS | Status: DC | PRN
  Administered 2018-02-10: 35 ug/kg/min via INTRAVENOUS

## 2018-02-10 MED ORDER — DEXAMETHASONE SODIUM PHOSPHATE 10 MG/ML IJ SOLN
INTRAMUSCULAR | Status: AC
  Filled 2018-02-10: qty 1

## 2018-02-10 MED ORDER — LIDOCAINE 2% (20 MG/ML) 5 ML SYRINGE
INTRAMUSCULAR | Status: AC
  Filled 2018-02-10: qty 10

## 2018-02-10 MED ORDER — FENTANYL CITRATE (PF) 250 MCG/5ML IJ SOLN
INTRAMUSCULAR | Status: AC
  Filled 2018-02-10: qty 5

## 2018-02-10 MED ORDER — DEXAMETHASONE SODIUM PHOSPHATE 10 MG/ML IJ SOLN
INTRAMUSCULAR | Status: DC | PRN
  Administered 2018-02-10: .5 mL

## 2018-02-10 MED ORDER — ROCURONIUM BROMIDE 50 MG/5ML IV SOLN
INTRAVENOUS | Status: AC
  Filled 2018-02-10: qty 1

## 2018-02-10 MED ORDER — BUPIVACAINE HCL (PF) 0.5 % IJ SOLN
INTRAMUSCULAR | Status: AC
  Filled 2018-02-10: qty 30

## 2018-02-10 MED ORDER — CEFAZOLIN SODIUM-DEXTROSE 2-4 GM/100ML-% IV SOLN
2.0000 g | INTRAVENOUS | Status: AC
  Administered 2018-02-10: 2 g via INTRAVENOUS
  Filled 2018-02-10: qty 100

## 2018-02-10 MED ORDER — HYDROMORPHONE HCL 2 MG/ML IJ SOLN
0.2500 mg | INTRAMUSCULAR | Status: DC | PRN
  Administered 2018-02-10: 1 mg via INTRAVENOUS
  Administered 2018-02-10 (×2): 0.5 mg via INTRAVENOUS

## 2018-02-10 MED ORDER — CEPHALEXIN 500 MG PO CAPS: 500.0000 mg | ORAL_CAPSULE | Freq: Three times a day (TID) | ORAL | 0 refills | Status: DC

## 2018-02-10 MED ORDER — FENTANYL CITRATE (PF) 100 MCG/2ML IJ SOLN
INTRAMUSCULAR | Status: DC | PRN
  Administered 2018-02-10 (×3): 50 ug via INTRAVENOUS
  Administered 2018-02-10: 100 ug via INTRAVENOUS

## 2018-02-10 MED ORDER — MIDAZOLAM HCL 2 MG/2ML IJ SOLN
INTRAMUSCULAR | Status: AC
  Filled 2018-02-10: qty 2

## 2018-02-10 MED ORDER — PROPOFOL 10 MG/ML IV BOLUS
INTRAVENOUS | Status: AC
  Filled 2018-02-10: qty 20

## 2018-02-10 MED ORDER — MEPERIDINE HCL 50 MG/ML IJ SOLN: 6.2500 mg | INTRAMUSCULAR | Status: DC | PRN

## 2018-02-10 MED ORDER — OXYCODONE-ACETAMINOPHEN 10-325 MG PO TABS: 1.0000 | ORAL_TABLET | ORAL | 0 refills | Status: DC | PRN

## 2018-02-10 MED ORDER — LACTATED RINGERS IV SOLN: INTRAVENOUS | Status: DC

## 2018-02-10 MED ORDER — LABETALOL HCL 5 MG/ML IV SOLN
INTRAVENOUS | Status: AC
  Filled 2018-02-10: qty 4

## 2018-02-10 MED ORDER — 0.9 % SODIUM CHLORIDE (POUR BTL) OPTIME
TOPICAL | Status: DC | PRN
  Administered 2018-02-10: 1000 mL

## 2018-02-10 MED ORDER — PROMETHAZINE HCL 25 MG/ML IJ SOLN: 6.2500 mg | INTRAMUSCULAR | Status: DC | PRN

## 2018-02-10 MED ORDER — VANCOMYCIN HCL 1000 MG IV SOLR
INTRAVENOUS | Status: AC
  Filled 2018-02-10: qty 1000

## 2018-02-10 MED ORDER — ONDANSETRON HCL 4 MG/2ML IJ SOLN
INTRAMUSCULAR | Status: AC
  Filled 2018-02-10: qty 2

## 2018-02-10 MED ORDER — FENTANYL CITRATE (PF) 100 MCG/2ML IJ SOLN
INTRAMUSCULAR | Status: AC
  Filled 2018-02-10: qty 2

## 2018-02-10 MED ORDER — INSULIN ASPART 100 UNIT/ML ~~LOC~~ SOLN
5.0000 [IU] | Freq: Once | SUBCUTANEOUS | Status: AC
  Administered 2018-02-10: 5 [IU] via INTRAVENOUS
  Filled 2018-02-10: qty 0.05

## 2018-02-10 MED ORDER — DEXAMETHASONE SODIUM PHOSPHATE 10 MG/ML IJ SOLN
INTRAMUSCULAR | Status: DC | PRN
  Administered 2018-02-10: 10 mg via INTRAVENOUS

## 2018-02-10 SURGICAL SUPPLY — 56 items
BANDAGE ACE 4X5 VEL STRL LF (GAUZE/BANDAGES/DRESSINGS) ×1 IMPLANT
BANDAGE ACE 6X5 VEL STRL LF (GAUZE/BANDAGES/DRESSINGS) ×1 IMPLANT
BNDG CMPR 9X4 STRL LF SNTH (GAUZE/BANDAGES/DRESSINGS)
BNDG ESMARK 4X9 LF (GAUZE/BANDAGES/DRESSINGS) IMPLANT
BNDG GAUZE ELAST 4 BULKY (GAUZE/BANDAGES/DRESSINGS) IMPLANT
CHLORAPREP W/TINT 26ML (MISCELLANEOUS) ×2 IMPLANT
COVER SURGICAL LIGHT HANDLE (MISCELLANEOUS) ×2 IMPLANT
CUFF TOURNIQUET SINGLE 18IN (TOURNIQUET CUFF) IMPLANT
CUFF TOURNIQUET SINGLE 34IN LL (TOURNIQUET CUFF) IMPLANT
DRAPE U-SHAPE 47X51 STRL (DRAPES) ×2 IMPLANT
ELECT CAUTERY BLADE 6.4 (BLADE) ×2 IMPLANT
ELECT REM PT RETURN 9FT ADLT (ELECTROSURGICAL) ×2
ELECTRODE REM PT RTRN 9FT ADLT (ELECTROSURGICAL) ×1 IMPLANT
GAUZE SPONGE 4X4 12PLY STRL (GAUZE/BANDAGES/DRESSINGS) IMPLANT
GAUZE SPONGE 4X4 12PLY STRL LF (GAUZE/BANDAGES/DRESSINGS) ×1 IMPLANT
GAUZE XEROFORM 5X9 LF (GAUZE/BANDAGES/DRESSINGS) ×1 IMPLANT
GLOVE BIO SURGEON STRL SZ7.5 (GLOVE) ×2 IMPLANT
GLOVE BIOGEL PI IND STRL 8 (GLOVE) ×1 IMPLANT
GLOVE BIOGEL PI INDICATOR 8 (GLOVE) ×1
GOWN STRL REUS W/ TWL LRG LVL3 (GOWN DISPOSABLE) ×1 IMPLANT
GOWN STRL REUS W/ TWL XL LVL3 (GOWN DISPOSABLE) ×1 IMPLANT
GOWN STRL REUS W/TWL LRG LVL3 (GOWN DISPOSABLE) ×2
GOWN STRL REUS W/TWL XL LVL3 (GOWN DISPOSABLE) ×2
KIT BASIN OR (CUSTOM PROCEDURE TRAY) ×2 IMPLANT
KIT INTERNAL BRACE (Anchor) ×1 IMPLANT
KIT TURNOVER KIT B (KITS) ×2 IMPLANT
MANIFOLD NEPTUNE II (INSTRUMENTS) ×2 IMPLANT
NDL 18GX1X1/2 (RX/OR ONLY) (NEEDLE) IMPLANT
NDL BIOPSY JAMSHIDI 8X6 (NEEDLE) IMPLANT
NDL HYPO 25GX1X1/2 BEV (NEEDLE) IMPLANT
NEEDLE 18GX1X1/2 (RX/OR ONLY) (NEEDLE) ×4 IMPLANT
NEEDLE BIOPSY JAMSHIDI 8X6 (NEEDLE) IMPLANT
NEEDLE HYPO 25GX1X1/2 BEV (NEEDLE) ×2 IMPLANT
NS IRRIG 1000ML POUR BTL (IV SOLUTION) ×2 IMPLANT
PACK ORTHO EXTREMITY (CUSTOM PROCEDURE TRAY) ×2 IMPLANT
PAD ARMBOARD 7.5X6 YLW CONV (MISCELLANEOUS) ×4 IMPLANT
PAD CAST 4YDX4 CTTN HI CHSV (CAST SUPPLIES) IMPLANT
PADDING CAST COTTON 4X4 STRL (CAST SUPPLIES) ×2
PROBE DEBRIDE SONICVAC MISONIX (TIP) IMPLANT
SCRUB BETADINE 4OZ XXX (MISCELLANEOUS) ×2 IMPLANT
SET CYSTO W/LG BORE CLAMP LF (SET/KITS/TRAYS/PACK) ×2 IMPLANT
SOL PREP POV-IOD 4OZ 10% (MISCELLANEOUS) ×2 IMPLANT
SPLINT FIBERGLASS 3X35 (CAST SUPPLIES) ×1 IMPLANT
SUT MERSILENE 4 0 P 3 (SUTURE) ×2 IMPLANT
SUT MNCRL AB 3-0 PS2 18 (SUTURE) ×1 IMPLANT
SUT PROLENE 4 0 RB 1 (SUTURE) ×2
SUT PROLENE 4-0 RB1 .5 CRCL 36 (SUTURE) IMPLANT
SUT SILK 3 0 (SUTURE) ×2
SUT SILK 3 0 SH CR/8 (SUTURE) ×1 IMPLANT
SUT SILK 3-0 18XBRD TIE 12 (SUTURE) IMPLANT
SYR CONTROL 10ML LL (SYRINGE) IMPLANT
SYRINGE 3CC LL L/F (MISCELLANEOUS) ×2 IMPLANT
TOWEL GREEN STERILE (TOWEL DISPOSABLE) ×2 IMPLANT
TOWEL GREEN STERILE FF (TOWEL DISPOSABLE) ×2 IMPLANT
TUBE CONNECTING 12X1/4 (SUCTIONS) ×2 IMPLANT
YANKAUER SUCT BULB TIP NO VENT (SUCTIONS) ×2 IMPLANT

## 2018-02-10 NOTE — Progress Notes (Signed)
Orthopedic Tech Progress Note Patient Details:  [name] October 23, 1960 [id number]  Ortho Devices Type of Ortho Device: Postop shoe/boot Ortho Device/Splint Location: LLE Ortho Device/Splint Interventions: Ordered, Application   Post Interventions Patient Tolerated: Valdez Instructions Provided: Care of device   Braulio Bosch 02/10/2018, 4:48 PM

## 2018-02-10 NOTE — Anesthesia Procedure Notes (Signed)
Anesthesia Regional Block: Adductor canal block   Pre-Anesthetic Checklist: ,, timeout performed, Correct Patient, Correct Site, Correct Laterality, Correct Procedure, Correct Position, site marked, Risks and benefits discussed,  Surgical consent,  Pre-op evaluation,  At surgeon's request and post-op pain management  Laterality: Left  Prep: chloraprep       Needles:  Injection technique: Single-shot  Needle Type: Echogenic Needle     Needle Length: 9cm  Needle Gauge: 21     Additional Needles:   Procedures:,,,, ultrasound used (permanent image in chart),,,,  Narrative:  Start time: 02/10/2018 1:40 PM End time: 02/10/2018 1:50 PM Injection made incrementally with aspirations every 5 mL.  Performed by: Personally  Anesthesiologist: [name], MD  Additional Notes: Patient tolerated the procedure well. Local anesthetic introduced in an incremental fashion under minimal resistance after negative aspirations. No paresthesias were elicited. After completion of the procedure, no acute issues were identified and patient continued to be monitored by RN.

## 2018-02-10 NOTE — Progress Notes (Signed)
Orthopedic Tech Progress Note Patient Details:  COOPER STAMP 10/23/1960 [id number]  Ortho Devices Type of Ortho Device: Crutches Ortho Device/Splint Interventions: Ordered, Adjustment   Post Interventions Patient Tolerated: Well Instructions Provided: Care of device   Braulio Bosch 02/10/2018, 4:41 PM

## 2018-02-10 NOTE — Interval H&P Note (Signed)
History and Physical Interval Note:  02/10/2018 10:59 AM  [name]  has presented today for surgery, with the diagnosis of TENDON TEAR LEFT FOOT  The various methods of treatment have been discussed with the patient and family. After consideration of risks, benefits and other options for treatment, the patient has consented to  Procedure(s): PERONEAL TENDON REPAIR (Left) [place] (Left) as a surgical intervention .  The patient's history has been reviewed, patient examined, no change in status, stable for surgery.  I have reviewed the patient's chart and labs.  Questions were answered to the patient's satisfaction.     [name]

## 2018-02-10 NOTE — Transfer of Care (Signed)
Immediate Anesthesia Transfer of Care Note  Patient: [name]  Procedure(s) Performed: PERONEAL TENDON REPAIR and Synovectomy Peroneal Tendon (Left Ankle) Left  ANKLE ATFL Repair and Ligament Augmentation, Injection of Tendon Splint Application (Left Ankle)  Patient Location: PACU  Anesthesia Type:General  Level of Consciousness: awake, alert , oriented and sedated  Airway & Oxygen Therapy: Patient Spontanous Breathing and Patient connected to nasal cannula oxygen  Post-op Assessment: Report given to RN, Post -op Vital signs reviewed and stable and Patient moving all extremities  Post vital signs: Reviewed and stable  Last Vitals:  Vitals Value Taken Time  BP 133/82 02/10/2018  2:03 PM  Temp    Pulse 98 02/10/2018  2:08 PM  Resp 17 02/10/2018  2:08 PM  SpO2 95 % 02/10/2018  2:08 PM  Vitals shown include unvalidated device data.  Last Pain:  Vitals:   02/10/18 0924  TempSrc: Oral  PainSc:       Patients Stated Pain Goal: 0 (13/88/71 9597)  Complications: No apparent anesthesia complications

## 2018-02-10 NOTE — Brief Op Note (Signed)
02/10/2018  1:48 PM  PATIENT:  [name]  57 y.o. female  PRE-OPERATIVE DIAGNOSIS:  TENDON TEAR LEFT FOOT  POST-OPERATIVE DIAGNOSIS:  Tendon Tear Left Foot  PROCEDURE:  Procedure(s): PERONEAL TENDON REPAIR and Synovectomy Peroneal Tendon (Left) Left  ANKLE ATFL Repair and Ligament Augmentation, Injection of Tendon Splint Application (Left)  SURGEON:  Surgeon(s) and Role:    * [name], DPM - Primary  PHYSICIAN ASSISTANT:   ASSISTANTS: none   ANESTHESIA:   none  EBL:  Minimal   BLOOD ADMINISTERED:none  DRAINS: none   LOCAL MEDICATIONS USED:  None  SPECIMEN:  No Specimen  DISPOSITION OF SPECIMEN:  N/A  COUNTS:  YES  TOURNIQUET:   Total Tourniquet Time Documented: Thigh (Left) - 73 minutes Total: Thigh (Left) - 73 minutes   DICTATION: .Note written in EPIC  PLAN OF CARE: Discharge to home after PACU  PATIENT DISPOSITION:  PACU - hemodynamically stable.   Delay start of Pharmacological VTE agent (>24hrs) due to surgical blood loss or risk of bleeding: not applicable

## 2018-02-10 NOTE — Anesthesia Procedure Notes (Signed)
Procedure Name: LMA Insertion Date/Time: 02/10/2018 11:56 AM Performed by: Scheryl Darter, CRNA Pre-anesthesia Checklist: Patient identified, Emergency Drugs available, Suction available and Patient being monitored Patient Re-evaluated:Patient Re-evaluated prior to induction Oxygen Delivery Method: Circle System Utilized Preoxygenation: Pre-oxygenation with 100% oxygen Induction Type: IV induction Ventilation: Mask ventilation without difficulty LMA: LMA inserted LMA Size: 4.0 Number of attempts: 1 Airway Equipment and Method: Bite block Placement Confirmation: positive ETCO2 Tube secured with: Tape Dental Injury: Teeth and Oropharynx as per pre-operative assessment

## 2018-02-10 NOTE — Anesthesia Procedure Notes (Signed)
Anesthesia Regional Block: Popliteal block   Pre-Anesthetic Checklist: ,, timeout performed, Correct Patient, Correct Site, Correct Laterality, Correct Procedure, Correct Position, site marked, Risks and benefits discussed,  Surgical consent,  Pre-op evaluation,  At surgeon's request and post-op pain management  Laterality: Left  Prep: chloraprep       Needles:  Injection technique: Single-shot  Needle Type: Echogenic Needle     Needle Length: 9cm  Needle Gauge: 21     Additional Needles:   Procedures:,,,, ultrasound used (permanent image in chart),,,,  Narrative:  Start time: 02/10/2018 1:30 PM End time: 02/10/2018 1:40 PM Injection made incrementally with aspirations every 5 mL.  Performed by: Personally  Anesthesiologist: [name], MD  Additional Notes: Patient tolerated the procedure well. Local anesthetic introduced in an incremental fashion under minimal resistance after negative aspirations. No paresthesias were elicited. After completion of the procedure, no acute issues were identified and patient continued to be monitored by RN.

## 2018-02-10 NOTE — Op Note (Signed)
Patient Name: [name] DOB: 05/13/1961  MRN: [id number]  Date of Service: 02/10/18  Surgeon: Dr. Hardie Pulley, DPM Assistants: None Pre-operative Diagnosis: Ankle instability left, peroneal tendon tear left, synovitis tenosynovitis left, sprain of ATFL sequela left Post-operative Diagnosis: Same Procedures:             1) Repair left peroneus brevis tendon  2) Synovectomy L peroneal tendon sheath  3) Repair left anterior talofibular ligament  4) Augmentation L anterior talofibular ligament  5) Injection of Tendon Sheath  6) Application of Splint Pathology/Specimens:             1) None Anesthesia: MAC; Post-op block Hemostasis: Anatomic Estimated Blood Loss: minimal Materials: None Medications: 1cc 0.5% Marcaine, 86m Dexamethasone Complications: None  Indications for Procedure: This is a 57year old female with chronic pain to her left ankle.  She had feelings of instability and generalized pain at the ankle.  MRI was performed and and showed degeneration of the peroneal brevis tendon; it did appear that the anterior talofibular ligament was intact however grossly she had significant inversion noted of the ankle.  It was discussed the patient that she would benefit from repair of the peroneus tendon with lateral ankle repair and stabilization all risk benefits alternatives explained no guarantees given  Procedure in Detail: Patient was identified in pre-operative holding area. Formal consent was signed and the left lower extremity was marked. Patient was brought back to the operating room and placed on the operating room table in the supine position. Anesthesia was induced. The left lower extremity was prepped and draped in the usual sterile fashion. Timeout was taken to confirm patient name, laterality, and procedure prior to incision. Attention was then directed to the lateral ankle where incision was made about the posterior aspect of the border of the fibula and curving  anteriorly as it went distal towards the talar head.  Dissection was carried down deep with care to avoid all vital neurovascular structures all bleeders were cauterized with electrocautery.  Dissection was carried down to the level of the peroneal tendon sheath.  An incision was made in the tendon sheath and a groove director was used to gently open the tendon sheath.  The tendon sheath was tied with silk ties.  The peroneal tendons were identified.  The pain as long as tendon appeared healthy and intact.  The peroneal brevis tendon appeared slightly flattened with evidence of a small approximate 0.5 cm central split tear.  There was also noted to be significant synovitis and low-lying muscle belly in the tendon sheath.  The low-lying muscle belly and synovitis was debrided with a rongeur.  The previous tendon was then repaired with 4-0 Mersilene with care to tubularize the tendon to strengthen it.  A mixture of 1 cc of half percent Marcaine plain and 5 mg dexamethasone was injected into the tendon sheath to decrease inflammation and synovitis.  Attention was then directed to the distal aspect of the incision.  The ankle joint was brought through its range of inversion eversion was noted to be greater than 60 degrees of ankle inversion noted which is pathologic.  Decision was found carried down to level of the ATFL.  The ATFL was identified.  The ATFL was released from the border of the fibula.  Dissection was also carried down to the level of the lateral talar neck.  At this point an Arthrex internal brace was then applied according to standard technique and short a 3.7 anchor was placed in  the lateral aspect of the talus with good seating noted of the suture anchor.  A drill hole was then made in the fibula for 4.5 mm anchor the fiber tape was then passed through the anchor and the anchor was placed in the fibula with good tensioning noted.  The ankle was then brought through its range of motion with  approximately 30 degrees of inversion eversion noted.  The ATFL was then primarily repaired with fiber tape.  The repair did appear strong.  At this point the incision was then copiously irrigated.  The tendon sheath of the peroneal tendons was repaired with 3-0 Mersilene.  The skin was then closed with 3-0 Monocryl and 4-0 Prolene.  The foot was dressed with Xeroform 4 x 4 cast padding and placed into a posterior splint.  Patient tolerated procedure well  Disposition: Following a period of post-operative monitoring, patient will be transferred back home.

## 2018-02-10 NOTE — Anesthesia Preprocedure Evaluation (Addendum)
Anesthesia Evaluation  Patient identified by MRN, date of birth, ID band Patient awake    Reviewed: Allergy & Precautions, NPO status , Patient's Chart, lab work & pertinent test results  Airway Mallampati: II  TM Distance: >3 FB Neck ROM: Full    Dental  (+) Teeth Intact, Dental Advisory Given   Pulmonary    breath sounds clear to auscultation       Cardiovascular hypertension, Pt. on home beta blockers  Rhythm:Regular Rate:Normal     Neuro/Psych PSYCHIATRIC DISORDERS Anxiety  Neuromuscular disease    GI/Hepatic Neg liver ROS, GERD  Medicated,(+) Cirrhosis       ,   Endo/Other  diabetes, Type 2, Oral Hypoglycemic Agents  Renal/GU negative Renal ROS     Musculoskeletal   Abdominal Normal abdominal exam  (+)   Peds  Hematology   Anesthesia Other Findings   Reproductive/Obstetrics                            Anesthesia Physical Anesthesia Plan  ASA: III  Anesthesia Plan: MAC   Post-op Pain Management:    Induction: Intravenous  PONV Risk Score and Plan: 3 and Ondansetron, Propofol infusion and Treatment may vary due to age or medical condition  Airway Management Planned: Simple Face Mask  Additional Equipment: None  Intra-op Plan:   Post-operative Plan:   Informed Consent: I have reviewed the patients History and Physical, chart, labs and discussed the procedure including the risks, benefits and alternatives for the proposed anesthesia with the patient or authorized representative who has indicated his/her understanding and acceptance.   Dental advisory given  Plan Discussed with: CRNA  Anesthesia Plan Comments: (Podiatrist does not want to perform block. Converted to [place] so as not to delay case, consented patient for block post op. )      Anesthesia Quick Evaluation

## 2018-02-10 NOTE — Anesthesia Postprocedure Evaluation (Signed)
Anesthesia Post Note  Patient: MARCHETA HORSEY  Procedure(s) Performed: PERONEAL TENDON REPAIR and Synovectomy Peroneal Tendon (Left Ankle) Left  ANKLE ATFL Repair and Ligament Augmentation, Injection of Tendon Splint Application (Left Ankle)     Patient location during evaluation: PACU Anesthesia Type: General Level of consciousness: awake and alert Pain management: pain level controlled Vital Signs Assessment: post-procedure vital signs reviewed and stable Respiratory status: spontaneous breathing, nonlabored ventilation, respiratory function stable and patient connected to nasal cannula oxygen Cardiovascular status: blood pressure returned to baseline and stable Postop Assessment: no apparent nausea or vomiting Anesthetic complications: no    Last Vitals:  Vitals:   02/10/18 1705 02/10/18 1725  BP: (!) 155/87 137/74  Pulse: (!) 110 (!) 106  Resp: 19 19  Temp:    SpO2: 94% 94%    Last Pain:  Vitals:   02/10/18 1725  TempSrc:   PainSc: 0-No pain                 [name]

## 2018-02-11 ENCOUNTER — Telehealth: Payer: Self-pay | Admitting: Podiatry

## 2018-02-11 ENCOUNTER — Encounter (HOSPITAL_COMMUNITY): Payer: Self-pay | Admitting: Podiatry

## 2018-02-11 ENCOUNTER — Ambulatory Visit: Payer: Medicare Other | Admitting: "Endocrinology

## 2018-02-11 DIAGNOSIS — R2689 Other abnormalities of gait and mobility: Secondary | ICD-10-CM | POA: Diagnosis not present

## 2018-02-11 DIAGNOSIS — E1165 Type 2 diabetes mellitus with hyperglycemia: Secondary | ICD-10-CM | POA: Diagnosis not present

## 2018-02-11 DIAGNOSIS — K7460 Unspecified cirrhosis of liver: Secondary | ICD-10-CM | POA: Diagnosis not present

## 2018-02-11 DIAGNOSIS — S93492A Sprain of other ligament of left ankle, initial encounter: Secondary | ICD-10-CM | POA: Diagnosis not present

## 2018-02-11 DIAGNOSIS — Z794 Long term (current) use of insulin: Secondary | ICD-10-CM | POA: Diagnosis not present

## 2018-02-11 DIAGNOSIS — E1142 Type 2 diabetes mellitus with diabetic polyneuropathy: Secondary | ICD-10-CM | POA: Diagnosis not present

## 2018-02-11 NOTE — Progress Notes (Signed)
Left peroneal tendon repair; ankle ligament repair/supplementation.

## 2018-02-11 NOTE — Telephone Encounter (Signed)
Called patient to see how she was doing after surgery yesterday. Left message to call back with any questions or concerns. Advised to keep her scheduled follow up appointment.

## 2018-02-12 ENCOUNTER — Other Ambulatory Visit: Payer: Self-pay

## 2018-02-12 ENCOUNTER — Encounter: Payer: Medicare Other | Admitting: Podiatry

## 2018-02-12 DIAGNOSIS — Z794 Long term (current) use of insulin: Secondary | ICD-10-CM | POA: Diagnosis not present

## 2018-02-12 DIAGNOSIS — S93492A Sprain of other ligament of left ankle, initial encounter: Secondary | ICD-10-CM | POA: Diagnosis not present

## 2018-02-12 DIAGNOSIS — R2689 Other abnormalities of gait and mobility: Secondary | ICD-10-CM | POA: Diagnosis not present

## 2018-02-12 DIAGNOSIS — E1142 Type 2 diabetes mellitus with diabetic polyneuropathy: Secondary | ICD-10-CM | POA: Diagnosis not present

## 2018-02-12 DIAGNOSIS — K7460 Unspecified cirrhosis of liver: Secondary | ICD-10-CM | POA: Diagnosis not present

## 2018-02-12 DIAGNOSIS — E1165 Type 2 diabetes mellitus with hyperglycemia: Secondary | ICD-10-CM | POA: Diagnosis not present

## 2018-02-15 DIAGNOSIS — K7460 Unspecified cirrhosis of liver: Secondary | ICD-10-CM | POA: Diagnosis not present

## 2018-02-15 DIAGNOSIS — R2689 Other abnormalities of gait and mobility: Secondary | ICD-10-CM | POA: Diagnosis not present

## 2018-02-15 DIAGNOSIS — Z794 Long term (current) use of insulin: Secondary | ICD-10-CM | POA: Diagnosis not present

## 2018-02-15 DIAGNOSIS — S93492A Sprain of other ligament of left ankle, initial encounter: Secondary | ICD-10-CM | POA: Diagnosis not present

## 2018-02-15 DIAGNOSIS — E1165 Type 2 diabetes mellitus with hyperglycemia: Secondary | ICD-10-CM | POA: Diagnosis not present

## 2018-02-15 DIAGNOSIS — E1142 Type 2 diabetes mellitus with diabetic polyneuropathy: Secondary | ICD-10-CM | POA: Diagnosis not present

## 2018-02-16 DIAGNOSIS — E1142 Type 2 diabetes mellitus with diabetic polyneuropathy: Secondary | ICD-10-CM | POA: Diagnosis not present

## 2018-02-16 DIAGNOSIS — R2689 Other abnormalities of gait and mobility: Secondary | ICD-10-CM | POA: Diagnosis not present

## 2018-02-16 DIAGNOSIS — S93492A Sprain of other ligament of left ankle, initial encounter: Secondary | ICD-10-CM | POA: Diagnosis not present

## 2018-02-16 DIAGNOSIS — E1165 Type 2 diabetes mellitus with hyperglycemia: Secondary | ICD-10-CM | POA: Diagnosis not present

## 2018-02-16 DIAGNOSIS — K7460 Unspecified cirrhosis of liver: Secondary | ICD-10-CM | POA: Diagnosis not present

## 2018-02-16 DIAGNOSIS — Z794 Long term (current) use of insulin: Secondary | ICD-10-CM | POA: Diagnosis not present

## 2018-02-18 DIAGNOSIS — Z794 Long term (current) use of insulin: Secondary | ICD-10-CM | POA: Diagnosis not present

## 2018-02-18 DIAGNOSIS — R2689 Other abnormalities of gait and mobility: Secondary | ICD-10-CM | POA: Diagnosis not present

## 2018-02-18 DIAGNOSIS — E1142 Type 2 diabetes mellitus with diabetic polyneuropathy: Secondary | ICD-10-CM | POA: Diagnosis not present

## 2018-02-18 DIAGNOSIS — S93492A Sprain of other ligament of left ankle, initial encounter: Secondary | ICD-10-CM | POA: Diagnosis not present

## 2018-02-18 DIAGNOSIS — K7460 Unspecified cirrhosis of liver: Secondary | ICD-10-CM | POA: Diagnosis not present

## 2018-02-18 DIAGNOSIS — E1165 Type 2 diabetes mellitus with hyperglycemia: Secondary | ICD-10-CM | POA: Diagnosis not present

## 2018-02-19 ENCOUNTER — Ambulatory Visit (INDEPENDENT_AMBULATORY_CARE_PROVIDER_SITE_OTHER): Payer: Medicare Other | Admitting: Podiatry

## 2018-02-19 ENCOUNTER — Telehealth: Payer: Self-pay | Admitting: *Deleted

## 2018-02-19 DIAGNOSIS — Z9889 Other specified postprocedural states: Secondary | ICD-10-CM

## 2018-02-19 DIAGNOSIS — M25372 Other instability, left ankle: Secondary | ICD-10-CM

## 2018-02-19 DIAGNOSIS — I10 Essential (primary) hypertension: Secondary | ICD-10-CM | POA: Diagnosis not present

## 2018-02-19 DIAGNOSIS — M7672 Peroneal tendinitis, left leg: Secondary | ICD-10-CM | POA: Diagnosis not present

## 2018-02-19 DIAGNOSIS — E119 Type 2 diabetes mellitus without complications: Secondary | ICD-10-CM | POA: Diagnosis not present

## 2018-02-19 DIAGNOSIS — M159 Polyosteoarthritis, unspecified: Secondary | ICD-10-CM | POA: Diagnosis not present

## 2018-02-19 MED ORDER — OXYCODONE-ACETAMINOPHEN 10-325 MG PO TABS: 1.0000 | ORAL_TABLET | ORAL | 0 refills | Status: DC | PRN

## 2018-02-19 NOTE — Telephone Encounter (Signed)
Dr. March Rummage states knee scooter was to be ordered, pt states no one has contacted her yet. Orders for knee scooter faxed to [place] and emailed to [place]. [place]et [place].

## 2018-02-21 NOTE — Progress Notes (Signed)
  Subjective:  Patient ID: [name], female    DOB: Feb 09, 1961,  MRN: [id number]  Chief Complaint  Patient presents with  . Routine Post Op     dos 05.08.2019 Repair Peroneal Tendon Lt; Ankle Ligament Repair/Supplementation Lt     DOS: 02/10/18 Procedure: Left peroneal tendon repair, left ATFL augmentation repair  57 y.o. female returns for post-op check. Denies N/V/F/Ch. Pain is controlled with current medications.  Missed last week's appointment due to transportation issue.  Objective:   General AA&O x3. Normal mood and affect.  Vascular Foot warm and well perfused.  Neurologic Gross sensation intact.  Dermatologic Skin healing well without signs of infection. Skin edges well coapted without signs of infection.  Orthopedic: Tenderness to palpation noted about the surgical site.    Assessment & Plan:  Patient was evaluated and treated and all questions answered.  S/p Left peroneal tendon repair, left ATFL augmentation repair -Progressing as expected post-operatively. -Sutures: intact. -Medications refilled: Percocet -Foot redressed. -SLC applied.  Return in about 1 week (around 02/26/2018).

## 2018-02-22 ENCOUNTER — Other Ambulatory Visit: Payer: Self-pay | Admitting: Neurology

## 2018-02-22 DIAGNOSIS — S93492A Sprain of other ligament of left ankle, initial encounter: Secondary | ICD-10-CM | POA: Diagnosis not present

## 2018-02-22 DIAGNOSIS — M5416 Radiculopathy, lumbar region: Secondary | ICD-10-CM

## 2018-02-22 DIAGNOSIS — K7460 Unspecified cirrhosis of liver: Secondary | ICD-10-CM | POA: Diagnosis not present

## 2018-02-22 DIAGNOSIS — R2689 Other abnormalities of gait and mobility: Secondary | ICD-10-CM | POA: Diagnosis not present

## 2018-02-22 DIAGNOSIS — E1165 Type 2 diabetes mellitus with hyperglycemia: Secondary | ICD-10-CM | POA: Diagnosis not present

## 2018-02-22 DIAGNOSIS — E1142 Type 2 diabetes mellitus with diabetic polyneuropathy: Secondary | ICD-10-CM | POA: Diagnosis not present

## 2018-02-22 DIAGNOSIS — Z794 Long term (current) use of insulin: Secondary | ICD-10-CM | POA: Diagnosis not present

## 2018-02-23 ENCOUNTER — Telehealth: Payer: Self-pay | Admitting: Podiatry

## 2018-02-23 NOTE — Telephone Encounter (Signed)
This message is for Dr. Eleanora Neighbor nurse. I'm calling in reference to a couple of things. I did not get approved for the knee scooter. They said medicaid doesn't cover it at all. Second of all, this cast on top of my foot is causing a sore, its cutting the top of my foot and its breaking down the skin. Sometimes it gets really tight. Please call me back at [phone].

## 2018-02-23 NOTE — Telephone Encounter (Signed)
Pt states cast is rubbing and gets tight on and off, and knee scooter is not covered by Medicaid. I asked pt if she is up walking more than 15 minutes per hour and she stated yes. I told pt that is often why cast rub and become tight, to back her activity down to 15 minutes, and I offered to have her come in to be seen earlier than Friday to have the cast evaluated for splitting or padding off the rubbing areas. Pt states she does not have a ride and will work at padding off the cast. Pt requested a wheelchair, I stated I would see if covered by medicaid.

## 2018-02-23 NOTE — Telephone Encounter (Signed)
[name] states the wheel chair is covered but they have not been able to contact pt.

## 2018-02-23 NOTE — Telephone Encounter (Signed)
I informed pt and she states she would call [place].

## 2018-02-23 NOTE — Telephone Encounter (Signed)
Emailed [facility] [place] to see if medicaid covered a wheelchair.

## 2018-02-24 DIAGNOSIS — E1165 Type 2 diabetes mellitus with hyperglycemia: Secondary | ICD-10-CM | POA: Diagnosis not present

## 2018-02-24 DIAGNOSIS — E1142 Type 2 diabetes mellitus with diabetic polyneuropathy: Secondary | ICD-10-CM | POA: Diagnosis not present

## 2018-02-24 DIAGNOSIS — Z794 Long term (current) use of insulin: Secondary | ICD-10-CM | POA: Diagnosis not present

## 2018-02-24 DIAGNOSIS — K7460 Unspecified cirrhosis of liver: Secondary | ICD-10-CM | POA: Diagnosis not present

## 2018-02-24 DIAGNOSIS — R2689 Other abnormalities of gait and mobility: Secondary | ICD-10-CM | POA: Diagnosis not present

## 2018-02-24 DIAGNOSIS — S93492A Sprain of other ligament of left ankle, initial encounter: Secondary | ICD-10-CM | POA: Diagnosis not present

## 2018-02-26 ENCOUNTER — Other Ambulatory Visit: Payer: Medicare Other

## 2018-03-02 ENCOUNTER — Encounter (INDEPENDENT_AMBULATORY_CARE_PROVIDER_SITE_OTHER): Payer: Self-pay | Admitting: Internal Medicine

## 2018-03-02 ENCOUNTER — Ambulatory Visit (INDEPENDENT_AMBULATORY_CARE_PROVIDER_SITE_OTHER): Payer: Medicare Other | Admitting: Internal Medicine

## 2018-03-02 VITALS — BP 132/90 | HR 66 | Temp 97.4°F | Resp 18 | Ht 62.0 in

## 2018-03-02 DIAGNOSIS — K589 Irritable bowel syndrome without diarrhea: Secondary | ICD-10-CM | POA: Diagnosis not present

## 2018-03-02 DIAGNOSIS — K760 Fatty (change of) liver, not elsewhere classified: Secondary | ICD-10-CM | POA: Diagnosis not present

## 2018-03-02 DIAGNOSIS — K219 Gastro-esophageal reflux disease without esophagitis: Secondary | ICD-10-CM | POA: Diagnosis not present

## 2018-03-02 NOTE — Patient Instructions (Signed)
Dicyclomine 20 mg before breakfast 10 mg before lunch and 10 mg before evening meal.  Not to exceed 60 mg/day on any given day.

## 2018-03-02 NOTE — Progress Notes (Signed)
Presenting complaint;  Follow-up for IBS GERD and fatty liver disease.  Database and subjective:  Patient is 57 year old Caucasian female who is here for scheduled visit.  She was last seen in November 2018. She says she is doing well as far as GERD symptoms are concerned.  Lately she has had daily nausea without vomiting.  She feels nausea may be due to pain medication which she is taking for left ankle pain.  She had tendon repair on 02/10/2018.  She was an antibiotic for 10 days and last dose was about 10 days ago.  She continues to complain of loose stools.  She is having anywhere from 1-5 stools per day.  She is taking dicyclomine 20 mg every morning.  She continues to complain of cramping just before her bowels move.  As soon as she is through the cramping resolves.  She denies melena or rectal bleeding.  She rarely has heartburn.  She feels PPI is working.   EGD on 03/20/2017 revealed mild portal hypertensive gastropathy and gastritis.  Biopsy was negative for H. pylori infection. Colonoscopy on 03/20/2017 revealed 2 small polyps which were removed and turned out to be tubular adenomas, sigmoid diverticulosis and random biopsy from because of sigmoid colon was negative for microscopic colitis.  She had a GI pathogen panel back in July 2018 and was negative.  Current Medications: Outpatient Encounter Medications as of 03/02/2018  Medication Sig  . acetaZOLAMIDE (DIAMOX) 250 MG tablet Take 250 mg by mouth daily.   Marland Kitchen acyclovir (ZOVIRAX) 400 MG tablet Take 400 mg by mouth 3 (three) times daily as needed (for fever blisters).   Marland Kitchen atorvastatin (LIPITOR) 20 MG tablet TAKE ONE TABLET BY MOUTH DAILY.  Marland Kitchen Blood Glucose Monitoring Suppl (FREESTYLE LITE) DEVI Used to monitor blood glucose.  . Cholecalciferol (VITAMIN D3) 2000 units capsule Take 4,000 Units by mouth daily.   . ciclopirox (PENLAC) 8 % solution Apply topically at bedtime. Apply over nail and surrounding skin. Apply daily over previous coat.  Remove weekly with file or polish remover.  . Continuous Blood Gluc Sensor (FREESTYLE LIBRE SENSOR SYSTEM) MISC Use one sensor every 10 days.  . cyanocobalamin (,VITAMIN B-12,) 1000 MCG/ML injection Inject 1,000 mcg into the muscle every 30 (thirty) days.  . cyclobenzaprine (FLEXERIL) 10 MG tablet Take 10 mg by mouth every 8 (eight) hours as needed for muscle spasms.  . diazepam (VALIUM) 2 MG tablet Take 2 mg by mouth every 12 (twelve) hours as needed for anxiety.   . dicyclomine (BENTYL) 10 MG capsule Take 20 mg by mouth 3 (three) times daily as needed for spasms.  . DULoxetine (CYMBALTA) 60 MG capsule Take 60 mg by mouth daily.  Marland Kitchen estradiol (ESTRACE) 0.5 MG tablet Take 0.5 mg by mouth every evening.   . ferrous sulfate 325 (65 FE) MG tablet Take 1 tablet (325 mg total) by mouth daily. (Patient taking differently: Take 325 mg by mouth 2 (two) times a week. )  . fluticasone (FLONASE) 50 MCG/ACT nasal spray Place 2 sprays into both nostrils every morning.   Marland Kitchen glucose blood (FREESTYLE LITE) test strip Use as instructed  . metFORMIN (GLUCOPHAGE) 1000 MG tablet TAKE ONE TABLET BY MOUTH TWICE DAILY WITH A MEAL  . metoprolol (LOPRESSOR) 50 MG tablet Take 25 mg by mouth daily.   . Multiple Vitamins-Minerals (WOMENS MULTI PO) Take 1 tablet by mouth daily.   . ondansetron (ZOFRAN) 4 MG tablet Take 1 tablet (4 mg total) by mouth every 8 (eight) hours  as needed for nausea or vomiting.  Marland Kitchen oxyCODONE-acetaminophen (PERCOCET) 10-325 MG tablet Take 1 tablet by mouth every 4 (four) hours as needed for pain.  Marland Kitchen oxyCODONE-acetaminophen (PERCOCET/ROXICET) 5-325 MG tablet Take 1-2 tablets by mouth daily as needed for moderate pain.   . pantoprazole (PROTONIX) 40 MG tablet TAKE ONE TABLET BY MOUTH DAILY (STOP OMEPRAZOLE)  . PARoxetine (PAXIL) 40 MG tablet Take 40 mg by mouth daily.   Vladimir Faster Glycol-Propyl Glycol (SYSTANE OP) Place 1 drop into both eyes daily.  Marland Kitchen PRODIGY TWIST TOP LANCETS 28G MISC TEST TWICE DAILY   . promethazine (PHENERGAN) 25 MG tablet Take 1 tablet (25 mg total) by mouth every 8 (eight) hours as needed for nausea or vomiting.  Marland Kitchen tiZANidine (ZANAFLEX) 4 MG capsule Take 4 mg by mouth daily as needed for muscle spasms.   . TRESIBA FLEXTOUCH 200 UNIT/ML SOPN INJECT 60 UNITS AT BEDTIME.  . Turmeric 500 MG CAPS Take 500 mg by mouth daily.   . [DISCONTINUED] dicyclomine (BENTYL) 20 MG tablet Take 1 tablet (20 mg total) by mouth every 8 (eight) hours as needed for spasms. (Patient taking differently: Take 40 mg by mouth 2 (two) times daily. )  . [DISCONTINUED] cephALEXin (KEFLEX) 500 MG capsule Take 1 capsule (500 mg total) by mouth 3 (three) times daily. (Patient not taking: Reported on 03/02/2018)  . [DISCONTINUED] metoCLOPramide (REGLAN) 10 MG tablet TAKE ONE TABLET BY MOUTH AT BEDTIME (Patient not taking: Reported on 03/02/2018)   No facility-administered encounter medications on file as of 03/02/2018.      Objective: Blood pressure 132/90, pulse 66, temperature (!) 97.4 F (36.3 C), temperature source Oral, resp. rate 18, height 5' 2"  (1.575 m). Patient is alert and in no acute distress. She is wheelchair-bound.  She has left ankle cast. Conjunctiva is pink. Sclera is nonicteric Oropharyngeal mucosa is normal. No neck masses or thyromegaly noted. Cardiac exam with regular rhythm normal S1 and S2. No murmur or gallop noted. Lungs are clear to auscultation. Abdomen is full.  Bowel sounds are hyperactive.  On palpation abdomen is soft and nontender.  Left lobe of the liver palpable but is not firm or tender.  Spleen is not palpable. No LE edema or clubbing noted.  Labs/studies Results:  CBC from 02/10/2018 WBC 13.9, H&H 13.4 and 39.9 and platelet count 189K.  Lab data from 02/03/2018 Bilirubin 0.3, AP 92, AST 22, ALT 14, total protein 6.7 and albumin 3.7.  Serum calcium 8.9. BUN 12, creatinine 0.67 Serum sodium 141, potassium 3.9, chloride 107, CO2 27.   Assessment:  #1.   Irritable bowel syndrome.  She is still having diarrhea.  Recent antibiotic use may have made her diarrhea worse.  She does not have any symptoms to suggest C. difficile.  She has been off antibiotic for 10 days now.  She may benefit from higher dose of dicyclomine as long as she is able to tolerate it.  Colonoscopy in June last year was negative for endoscopic or microscopic colitis.  #2.  Chronic GERD.  She is doing well with therapy.  Recent of nausea appears to be due to pain medication relieved with antibiotic.  #3.  Nonalcoholic fatty liver disease.  By imaging criteria she has cirrhosis.  EGD in June last year revealed mild portal hypertensive gastropathy but no evidence of esophageal or gastric varices.  Hepatic function is well-preserved.  She needs to make sure that her diabetes is well controlled and she needs to resume regular physical activity or  walking as soon as she is able to do it.   Plan:  Take dicyclomine as follows; 20 mg before breakfast 10 mg before lunch and 10 mg before evening meal.  Can increase dose to 20 mg 3 times daily as long as has no side effects. Patient will call if diarrhea worsens. Office visit in 6 months.

## 2018-03-03 ENCOUNTER — Other Ambulatory Visit: Payer: Medicare Other

## 2018-03-05 ENCOUNTER — Ambulatory Visit (INDEPENDENT_AMBULATORY_CARE_PROVIDER_SITE_OTHER): Payer: Medicare Other | Admitting: Podiatry

## 2018-03-05 DIAGNOSIS — M7672 Peroneal tendinitis, left leg: Secondary | ICD-10-CM

## 2018-03-05 DIAGNOSIS — Z9889 Other specified postprocedural states: Secondary | ICD-10-CM

## 2018-03-05 NOTE — Progress Notes (Signed)
  Subjective:  Patient ID: [name], female    DOB: July 21, 1961,  MRN: [id number]  No chief complaint on file.   DOS: 02/10/18 Procedure: Left peroneal tendon repair, left ATFL augmentation repair  57 y.o. female returns for post-op check. Denies N/V/F/Ch.  We will states that she is completed physical therapy at home.  Pain controlled  Objective:   General AA&O x3. Normal mood and affect.  Vascular Foot warm and well perfused.  Neurologic Gross sensation intact.  Dermatologic Skin healing well without signs of infection. Skin edges well coapted without signs of infection.  Orthopedic:  No tenderness to palpation noted about the surgical site.  Appropriate ankle inversion noted left foot no pain palpation about the peroneal    Assessment & Plan:  Patient was evaluated and treated and all questions answered.  S/p Left peroneal tendon repair, left ATFL augmentation repair -Progressing as expected post-operatively. -Sutures: Moved. -Medications refilled: -Leg cast removed.  Cam boot dispensed  No follow-ups on file.

## 2018-03-09 ENCOUNTER — Other Ambulatory Visit (INDEPENDENT_AMBULATORY_CARE_PROVIDER_SITE_OTHER): Payer: Self-pay | Admitting: Internal Medicine

## 2018-03-10 DIAGNOSIS — E1142 Type 2 diabetes mellitus with diabetic polyneuropathy: Secondary | ICD-10-CM | POA: Diagnosis not present

## 2018-03-10 DIAGNOSIS — Z4789 Encounter for other orthopedic aftercare: Secondary | ICD-10-CM | POA: Diagnosis not present

## 2018-03-10 DIAGNOSIS — Z794 Long term (current) use of insulin: Secondary | ICD-10-CM | POA: Diagnosis not present

## 2018-03-10 DIAGNOSIS — M2011 Hallux valgus (acquired), right foot: Secondary | ICD-10-CM | POA: Diagnosis not present

## 2018-03-10 DIAGNOSIS — M2042 Other hammer toe(s) (acquired), left foot: Secondary | ICD-10-CM | POA: Diagnosis not present

## 2018-03-10 DIAGNOSIS — R262 Difficulty in walking, not elsewhere classified: Secondary | ICD-10-CM | POA: Diagnosis not present

## 2018-03-10 DIAGNOSIS — I10 Essential (primary) hypertension: Secondary | ICD-10-CM | POA: Diagnosis not present

## 2018-03-10 DIAGNOSIS — K7460 Unspecified cirrhosis of liver: Secondary | ICD-10-CM | POA: Diagnosis not present

## 2018-03-16 DIAGNOSIS — I10 Essential (primary) hypertension: Secondary | ICD-10-CM | POA: Diagnosis not present

## 2018-03-16 DIAGNOSIS — K7460 Unspecified cirrhosis of liver: Secondary | ICD-10-CM | POA: Diagnosis not present

## 2018-03-16 DIAGNOSIS — Z794 Long term (current) use of insulin: Secondary | ICD-10-CM | POA: Diagnosis not present

## 2018-03-16 DIAGNOSIS — E1142 Type 2 diabetes mellitus with diabetic polyneuropathy: Secondary | ICD-10-CM | POA: Diagnosis not present

## 2018-03-16 DIAGNOSIS — Z4789 Encounter for other orthopedic aftercare: Secondary | ICD-10-CM | POA: Diagnosis not present

## 2018-03-16 DIAGNOSIS — R262 Difficulty in walking, not elsewhere classified: Secondary | ICD-10-CM | POA: Diagnosis not present

## 2018-03-17 ENCOUNTER — Inpatient Hospital Stay: Admission: RE | Admit: 2018-03-17 | Payer: Medicare Other | Source: Ambulatory Visit

## 2018-03-18 ENCOUNTER — Telehealth: Payer: Self-pay | Admitting: Podiatry

## 2018-03-18 DIAGNOSIS — I10 Essential (primary) hypertension: Secondary | ICD-10-CM | POA: Diagnosis not present

## 2018-03-18 DIAGNOSIS — Z794 Long term (current) use of insulin: Secondary | ICD-10-CM | POA: Diagnosis not present

## 2018-03-18 DIAGNOSIS — Z4789 Encounter for other orthopedic aftercare: Secondary | ICD-10-CM | POA: Diagnosis not present

## 2018-03-18 DIAGNOSIS — R262 Difficulty in walking, not elsewhere classified: Secondary | ICD-10-CM | POA: Diagnosis not present

## 2018-03-18 DIAGNOSIS — K7460 Unspecified cirrhosis of liver: Secondary | ICD-10-CM | POA: Diagnosis not present

## 2018-03-18 DIAGNOSIS — E1142 Type 2 diabetes mellitus with diabetic polyneuropathy: Secondary | ICD-10-CM | POA: Diagnosis not present

## 2018-03-18 NOTE — Telephone Encounter (Signed)
Pt called back and left message that she would pick the new shoes out on 6.21.19 at her appt and to call her back if I needed to.

## 2018-03-18 NOTE — Telephone Encounter (Signed)
Called pt per Debbie @ [employer] the brooks shoes are no longer available due to [employer]. We need pt to pick out a different shoe. She is scheduled to see Dr March Rummage on 6.21.19 and can do it at that appt or if she wanted to come in prior to this to pick another one out that is ok to.

## 2018-03-22 DIAGNOSIS — Z794 Long term (current) use of insulin: Secondary | ICD-10-CM | POA: Diagnosis not present

## 2018-03-22 DIAGNOSIS — I10 Essential (primary) hypertension: Secondary | ICD-10-CM | POA: Diagnosis not present

## 2018-03-22 DIAGNOSIS — E1142 Type 2 diabetes mellitus with diabetic polyneuropathy: Secondary | ICD-10-CM | POA: Diagnosis not present

## 2018-03-22 DIAGNOSIS — R262 Difficulty in walking, not elsewhere classified: Secondary | ICD-10-CM | POA: Diagnosis not present

## 2018-03-22 DIAGNOSIS — K7460 Unspecified cirrhosis of liver: Secondary | ICD-10-CM | POA: Diagnosis not present

## 2018-03-22 DIAGNOSIS — Z4789 Encounter for other orthopedic aftercare: Secondary | ICD-10-CM | POA: Diagnosis not present

## 2018-03-24 ENCOUNTER — Other Ambulatory Visit: Payer: Self-pay | Admitting: "Endocrinology

## 2018-03-25 ENCOUNTER — Ambulatory Visit
Admission: RE | Admit: 2018-03-25 | Discharge: 2018-03-25 | Disposition: A | Discharge status: Home / Self Care | Payer: Medicare Other | Source: Ambulatory Visit | Attending: Neurology | Admitting: Neurology

## 2018-03-25 DIAGNOSIS — I10 Essential (primary) hypertension: Secondary | ICD-10-CM | POA: Diagnosis not present

## 2018-03-25 DIAGNOSIS — Z4789 Encounter for other orthopedic aftercare: Secondary | ICD-10-CM | POA: Diagnosis not present

## 2018-03-25 DIAGNOSIS — Z794 Long term (current) use of insulin: Secondary | ICD-10-CM | POA: Diagnosis not present

## 2018-03-25 DIAGNOSIS — E1142 Type 2 diabetes mellitus with diabetic polyneuropathy: Secondary | ICD-10-CM | POA: Diagnosis not present

## 2018-03-25 DIAGNOSIS — R262 Difficulty in walking, not elsewhere classified: Secondary | ICD-10-CM | POA: Diagnosis not present

## 2018-03-25 DIAGNOSIS — M545 Low back pain: Secondary | ICD-10-CM | POA: Diagnosis not present

## 2018-03-25 DIAGNOSIS — M5416 Radiculopathy, lumbar region: Secondary | ICD-10-CM

## 2018-03-25 DIAGNOSIS — K7460 Unspecified cirrhosis of liver: Secondary | ICD-10-CM | POA: Diagnosis not present

## 2018-03-25 MED ORDER — IOPAMIDOL (ISOVUE-M 200) INJECTION 41%
1.0000 mL | Freq: Once | INTRAMUSCULAR | Status: AC
  Administered 2018-03-25: 1 mL via EPIDURAL

## 2018-03-25 MED ORDER — METHYLPREDNISOLONE ACETATE 40 MG/ML INJ SUSP (RADIOLOG
120.0000 mg | Freq: Once | INTRAMUSCULAR | Status: AC
  Administered 2018-03-25: 120 mg via EPIDURAL

## 2018-03-26 ENCOUNTER — Ambulatory Visit (INDEPENDENT_AMBULATORY_CARE_PROVIDER_SITE_OTHER): Payer: Medicare Other | Admitting: Podiatry

## 2018-03-26 ENCOUNTER — Other Ambulatory Visit: Payer: Self-pay | Admitting: "Endocrinology

## 2018-03-26 DIAGNOSIS — Z9889 Other specified postprocedural states: Secondary | ICD-10-CM

## 2018-03-26 NOTE — Progress Notes (Signed)
  Subjective:  Patient ID: [name], female    DOB: [birth year],  MRN: [id number]  Chief Complaint  Patient presents with  . Routine Post Op    left tendon repair - doing great    DOS: 02/10/18 Procedure: Left peroneal tendon repair, left ATFL augmentation repair  57 y.o. female returns for post-op check. Denies N/V/F/Ch.  Doing well.  Minimal pain.  Has been working with physical therapy at home  Objective:   General AA&O x3. Normal mood and affect.  Vascular Foot warm and well perfused.  Neurologic Gross sensation intact.  Dermatologic Skin well healed.  Orthopedic:  Slight peroneal tenderness however good inversion no excessive laxity in inversion    Assessment & Plan:  Patient was evaluated and treated and all questions answered.  S/p Left peroneal tendon repair, left ATFL augmentation repair -Progressing as expected post-operatively. -Continue home PT will allow her to start weightbearing with walker during physical therapy only.  Work on active range of motion exercises will plan to transition weightbearing in 2 weeks.  No follow-ups on file.

## 2018-03-31 DIAGNOSIS — E1142 Type 2 diabetes mellitus with diabetic polyneuropathy: Secondary | ICD-10-CM | POA: Diagnosis not present

## 2018-03-31 DIAGNOSIS — K7460 Unspecified cirrhosis of liver: Secondary | ICD-10-CM | POA: Diagnosis not present

## 2018-03-31 DIAGNOSIS — R262 Difficulty in walking, not elsewhere classified: Secondary | ICD-10-CM | POA: Diagnosis not present

## 2018-03-31 DIAGNOSIS — Z794 Long term (current) use of insulin: Secondary | ICD-10-CM | POA: Diagnosis not present

## 2018-03-31 DIAGNOSIS — Z4789 Encounter for other orthopedic aftercare: Secondary | ICD-10-CM | POA: Diagnosis not present

## 2018-03-31 DIAGNOSIS — I10 Essential (primary) hypertension: Secondary | ICD-10-CM | POA: Diagnosis not present

## 2018-04-01 DIAGNOSIS — I10 Essential (primary) hypertension: Secondary | ICD-10-CM | POA: Diagnosis not present

## 2018-04-01 DIAGNOSIS — R262 Difficulty in walking, not elsewhere classified: Secondary | ICD-10-CM | POA: Diagnosis not present

## 2018-04-01 DIAGNOSIS — Z4789 Encounter for other orthopedic aftercare: Secondary | ICD-10-CM | POA: Diagnosis not present

## 2018-04-01 DIAGNOSIS — Z794 Long term (current) use of insulin: Secondary | ICD-10-CM | POA: Diagnosis not present

## 2018-04-01 DIAGNOSIS — K7460 Unspecified cirrhosis of liver: Secondary | ICD-10-CM | POA: Diagnosis not present

## 2018-04-01 DIAGNOSIS — E1142 Type 2 diabetes mellitus with diabetic polyneuropathy: Secondary | ICD-10-CM | POA: Diagnosis not present

## 2018-04-05 ENCOUNTER — Other Ambulatory Visit: Payer: Self-pay | Admitting: "Endocrinology

## 2018-04-06 DIAGNOSIS — K7460 Unspecified cirrhosis of liver: Secondary | ICD-10-CM | POA: Diagnosis not present

## 2018-04-06 DIAGNOSIS — E1142 Type 2 diabetes mellitus with diabetic polyneuropathy: Secondary | ICD-10-CM | POA: Diagnosis not present

## 2018-04-06 DIAGNOSIS — I10 Essential (primary) hypertension: Secondary | ICD-10-CM | POA: Diagnosis not present

## 2018-04-06 DIAGNOSIS — R262 Difficulty in walking, not elsewhere classified: Secondary | ICD-10-CM | POA: Diagnosis not present

## 2018-04-06 DIAGNOSIS — Z4789 Encounter for other orthopedic aftercare: Secondary | ICD-10-CM | POA: Diagnosis not present

## 2018-04-06 DIAGNOSIS — Z794 Long term (current) use of insulin: Secondary | ICD-10-CM | POA: Diagnosis not present

## 2018-04-07 ENCOUNTER — Encounter: Payer: Self-pay | Admitting: Podiatry

## 2018-04-07 ENCOUNTER — Ambulatory Visit (INDEPENDENT_AMBULATORY_CARE_PROVIDER_SITE_OTHER): Payer: Medicare Other | Admitting: Podiatry

## 2018-04-07 DIAGNOSIS — Z4789 Encounter for other orthopedic aftercare: Secondary | ICD-10-CM | POA: Diagnosis not present

## 2018-04-07 DIAGNOSIS — Z794 Long term (current) use of insulin: Secondary | ICD-10-CM | POA: Diagnosis not present

## 2018-04-07 DIAGNOSIS — M2041 Other hammer toe(s) (acquired), right foot: Secondary | ICD-10-CM | POA: Diagnosis not present

## 2018-04-07 DIAGNOSIS — Z9889 Other specified postprocedural states: Secondary | ICD-10-CM

## 2018-04-07 DIAGNOSIS — M2012 Hallux valgus (acquired), left foot: Secondary | ICD-10-CM | POA: Diagnosis not present

## 2018-04-07 DIAGNOSIS — E1142 Type 2 diabetes mellitus with diabetic polyneuropathy: Secondary | ICD-10-CM

## 2018-04-07 DIAGNOSIS — R262 Difficulty in walking, not elsewhere classified: Secondary | ICD-10-CM | POA: Diagnosis not present

## 2018-04-07 DIAGNOSIS — K7460 Unspecified cirrhosis of liver: Secondary | ICD-10-CM | POA: Diagnosis not present

## 2018-04-07 DIAGNOSIS — M2042 Other hammer toe(s) (acquired), left foot: Secondary | ICD-10-CM

## 2018-04-07 DIAGNOSIS — M2011 Hallux valgus (acquired), right foot: Secondary | ICD-10-CM

## 2018-04-07 DIAGNOSIS — I10 Essential (primary) hypertension: Secondary | ICD-10-CM | POA: Diagnosis not present

## 2018-04-12 DIAGNOSIS — Z794 Long term (current) use of insulin: Secondary | ICD-10-CM | POA: Diagnosis not present

## 2018-04-12 DIAGNOSIS — K7460 Unspecified cirrhosis of liver: Secondary | ICD-10-CM | POA: Diagnosis not present

## 2018-04-12 DIAGNOSIS — I10 Essential (primary) hypertension: Secondary | ICD-10-CM | POA: Diagnosis not present

## 2018-04-12 DIAGNOSIS — E1142 Type 2 diabetes mellitus with diabetic polyneuropathy: Secondary | ICD-10-CM | POA: Diagnosis not present

## 2018-04-12 DIAGNOSIS — R262 Difficulty in walking, not elsewhere classified: Secondary | ICD-10-CM | POA: Diagnosis not present

## 2018-04-12 DIAGNOSIS — Z4789 Encounter for other orthopedic aftercare: Secondary | ICD-10-CM | POA: Diagnosis not present

## 2018-04-14 DIAGNOSIS — Z4789 Encounter for other orthopedic aftercare: Secondary | ICD-10-CM | POA: Diagnosis not present

## 2018-04-14 DIAGNOSIS — R262 Difficulty in walking, not elsewhere classified: Secondary | ICD-10-CM | POA: Diagnosis not present

## 2018-04-14 DIAGNOSIS — Z794 Long term (current) use of insulin: Secondary | ICD-10-CM | POA: Diagnosis not present

## 2018-04-14 DIAGNOSIS — I10 Essential (primary) hypertension: Secondary | ICD-10-CM | POA: Diagnosis not present

## 2018-04-14 DIAGNOSIS — E1142 Type 2 diabetes mellitus with diabetic polyneuropathy: Secondary | ICD-10-CM | POA: Diagnosis not present

## 2018-04-14 DIAGNOSIS — K7460 Unspecified cirrhosis of liver: Secondary | ICD-10-CM | POA: Diagnosis not present

## 2018-04-19 ENCOUNTER — Ambulatory Visit (INDEPENDENT_AMBULATORY_CARE_PROVIDER_SITE_OTHER): Payer: Medicare Other | Admitting: Otolaryngology

## 2018-04-20 DIAGNOSIS — Z794 Long term (current) use of insulin: Secondary | ICD-10-CM | POA: Diagnosis not present

## 2018-04-20 DIAGNOSIS — K7460 Unspecified cirrhosis of liver: Secondary | ICD-10-CM | POA: Diagnosis not present

## 2018-04-20 DIAGNOSIS — Z4789 Encounter for other orthopedic aftercare: Secondary | ICD-10-CM | POA: Diagnosis not present

## 2018-04-20 DIAGNOSIS — I10 Essential (primary) hypertension: Secondary | ICD-10-CM | POA: Diagnosis not present

## 2018-04-20 DIAGNOSIS — R262 Difficulty in walking, not elsewhere classified: Secondary | ICD-10-CM | POA: Diagnosis not present

## 2018-04-20 DIAGNOSIS — E1142 Type 2 diabetes mellitus with diabetic polyneuropathy: Secondary | ICD-10-CM | POA: Diagnosis not present

## 2018-04-27 DIAGNOSIS — K7460 Unspecified cirrhosis of liver: Secondary | ICD-10-CM | POA: Diagnosis not present

## 2018-04-27 DIAGNOSIS — M5415 Radiculopathy, thoracolumbar region: Secondary | ICD-10-CM | POA: Diagnosis not present

## 2018-04-27 DIAGNOSIS — Z79891 Long term (current) use of opiate analgesic: Secondary | ICD-10-CM | POA: Diagnosis not present

## 2018-04-27 DIAGNOSIS — M461 Sacroiliitis, not elsewhere classified: Secondary | ICD-10-CM | POA: Diagnosis not present

## 2018-04-27 DIAGNOSIS — E1142 Type 2 diabetes mellitus with diabetic polyneuropathy: Secondary | ICD-10-CM | POA: Diagnosis not present

## 2018-04-27 DIAGNOSIS — R262 Difficulty in walking, not elsewhere classified: Secondary | ICD-10-CM | POA: Diagnosis not present

## 2018-04-27 DIAGNOSIS — Z794 Long term (current) use of insulin: Secondary | ICD-10-CM | POA: Diagnosis not present

## 2018-04-27 DIAGNOSIS — I10 Essential (primary) hypertension: Secondary | ICD-10-CM | POA: Diagnosis not present

## 2018-04-27 DIAGNOSIS — M545 Low back pain: Secondary | ICD-10-CM | POA: Diagnosis not present

## 2018-04-27 DIAGNOSIS — Z4789 Encounter for other orthopedic aftercare: Secondary | ICD-10-CM | POA: Diagnosis not present

## 2018-05-03 ENCOUNTER — Other Ambulatory Visit: Payer: Self-pay | Admitting: Neurology

## 2018-05-03 DIAGNOSIS — G8929 Other chronic pain: Secondary | ICD-10-CM

## 2018-05-03 DIAGNOSIS — M545 Low back pain: Principal | ICD-10-CM

## 2018-05-04 DIAGNOSIS — E1165 Type 2 diabetes mellitus with hyperglycemia: Secondary | ICD-10-CM | POA: Diagnosis not present

## 2018-05-04 DIAGNOSIS — Z794 Long term (current) use of insulin: Secondary | ICD-10-CM | POA: Diagnosis not present

## 2018-05-04 DIAGNOSIS — E118 Type 2 diabetes mellitus with unspecified complications: Secondary | ICD-10-CM | POA: Diagnosis not present

## 2018-05-04 NOTE — Progress Notes (Signed)
  Subjective:  Patient ID: [name], female    DOB: 10-30-1960,  MRN: [id number]  Chief Complaint  Patient presents with  . Routine Post Op    left tendon repair and i am here to get my diabetic shoes as well     DOS: 02/10/18 Procedure: Left peroneal tendon repair, left ATFL augmentation repair  57 y.o. female returns for post-op check. Denies N/V/F/Ch.  Doing well. Not complaining of pain today.  Objective:   General AA&O x3. Normal mood and affect.  Vascular Foot warm and well perfused.  Neurologic Gross sensation intact.  Dermatologic Skin well healed.  Orthopedic: Slight peroneal tenderness however good inversion no excessive laxity in inversion    Assessment & Plan:  Patient was evaluated and treated and all questions answered.  S/p Left peroneal tendon repair, left ATFL augmentation repair -Progressing as expected post-operatively. -Continue home PT  -Transition to normal shoegear. -Diabetic shoes dispensed.   No follow-ups on file.

## 2018-05-05 LAB — COMPLETE METABOLIC PANEL WITH GFR
AG RATIO: 1.2 (calc) (ref 1.0–2.5)
ALKALINE PHOSPHATASE (APISO): 129 U/L (ref 33–130)
ALT: 24 U/L (ref 6–29)
AST: 34 U/L (ref 10–35)
Albumin: 3.7 g/dL (ref 3.6–5.1)
BILIRUBIN TOTAL: 0.5 mg/dL (ref 0.2–1.2)
BUN: 18 mg/dL (ref 7–25)
CHLORIDE: 106 mmol/L (ref 98–110)
CO2: 24 mmol/L (ref 20–32)
Calcium: 9.5 mg/dL (ref 8.6–10.4)
Creat: 0.8 mg/dL (ref 0.50–1.05)
GFR, Est African American: 96 mL/min/{1.73_m2} (ref >=60)
GFR, Est Non African American: 82 mL/min/{1.73_m2} (ref >=60)
GLUCOSE: 316 mg/dL — AB (ref 65–139)
Globulin: 3 g/dL (calc) (ref 1.9–3.7)
POTASSIUM: 4.7 mmol/L (ref 3.5–5.3)
Sodium: 138 mmol/L (ref 135–146)
Total Protein: 6.7 g/dL (ref 6.1–8.1)

## 2018-05-05 LAB — HEMOGLOBIN A1C
EAG (MMOL/L): 12.7 (calc)
Hgb A1c MFr Bld: 9.6 % of total Hgb — ABNORMAL HIGH (ref <5.7)
Mean Plasma Glucose: 229 (calc)

## 2018-05-07 ENCOUNTER — Ambulatory Visit: Payer: Medicare Other | Admitting: "Endocrinology

## 2018-05-07 DIAGNOSIS — E1142 Type 2 diabetes mellitus with diabetic polyneuropathy: Secondary | ICD-10-CM | POA: Diagnosis not present

## 2018-05-07 DIAGNOSIS — R262 Difficulty in walking, not elsewhere classified: Secondary | ICD-10-CM | POA: Diagnosis not present

## 2018-05-07 DIAGNOSIS — K7460 Unspecified cirrhosis of liver: Secondary | ICD-10-CM | POA: Diagnosis not present

## 2018-05-07 DIAGNOSIS — Z4789 Encounter for other orthopedic aftercare: Secondary | ICD-10-CM | POA: Diagnosis not present

## 2018-05-07 DIAGNOSIS — I10 Essential (primary) hypertension: Secondary | ICD-10-CM | POA: Diagnosis not present

## 2018-05-07 DIAGNOSIS — Z794 Long term (current) use of insulin: Secondary | ICD-10-CM | POA: Diagnosis not present

## 2018-05-11 ENCOUNTER — Ambulatory Visit
Admission: RE | Admit: 2018-05-11 | Discharge: 2018-05-11 | Disposition: A | Discharge status: Home / Self Care | Payer: Medicare Other | Source: Ambulatory Visit | Attending: Neurology | Admitting: Neurology

## 2018-05-11 DIAGNOSIS — G8929 Other chronic pain: Secondary | ICD-10-CM

## 2018-05-11 DIAGNOSIS — M545 Low back pain: Principal | ICD-10-CM

## 2018-05-11 DIAGNOSIS — M47817 Spondylosis without myelopathy or radiculopathy, lumbosacral region: Secondary | ICD-10-CM | POA: Diagnosis not present

## 2018-05-11 MED ORDER — METHYLPREDNISOLONE ACETATE 40 MG/ML INJ SUSP (RADIOLOG
120.0000 mg | Freq: Once | INTRAMUSCULAR | Status: AC
  Administered 2018-05-11: 120 mg via EPIDURAL

## 2018-05-11 MED ORDER — IOPAMIDOL (ISOVUE-M 200) INJECTION 41%
1.0000 mL | Freq: Once | INTRAMUSCULAR | Status: AC
  Administered 2018-05-11: 1 mL via EPIDURAL

## 2018-05-13 ENCOUNTER — Ambulatory Visit: Payer: Medicare Other | Admitting: Podiatry

## 2018-05-18 ENCOUNTER — Encounter: Payer: Self-pay | Admitting: "Endocrinology

## 2018-05-18 ENCOUNTER — Ambulatory Visit: Payer: Medicare Other | Admitting: "Endocrinology

## 2018-05-28 ENCOUNTER — Ambulatory Visit: Payer: Medicare Other | Admitting: Podiatry

## 2018-05-28 DIAGNOSIS — Z299 Encounter for prophylactic measures, unspecified: Secondary | ICD-10-CM | POA: Diagnosis not present

## 2018-05-28 DIAGNOSIS — I10 Essential (primary) hypertension: Secondary | ICD-10-CM | POA: Diagnosis not present

## 2018-05-28 DIAGNOSIS — E1165 Type 2 diabetes mellitus with hyperglycemia: Secondary | ICD-10-CM | POA: Diagnosis not present

## 2018-05-28 DIAGNOSIS — M19012 Primary osteoarthritis, left shoulder: Secondary | ICD-10-CM | POA: Diagnosis not present

## 2018-05-28 DIAGNOSIS — M25512 Pain in left shoulder: Secondary | ICD-10-CM | POA: Diagnosis not present

## 2018-05-28 DIAGNOSIS — Z202 Contact with and (suspected) exposure to infections with a predominantly sexual mode of transmission: Secondary | ICD-10-CM | POA: Diagnosis not present

## 2018-05-28 DIAGNOSIS — F329 Major depressive disorder, single episode, unspecified: Secondary | ICD-10-CM | POA: Diagnosis not present

## 2018-05-28 DIAGNOSIS — Z6831 Body mass index (BMI) 31.0-31.9, adult: Secondary | ICD-10-CM | POA: Diagnosis not present

## 2018-06-21 DIAGNOSIS — F329 Major depressive disorder, single episode, unspecified: Secondary | ICD-10-CM | POA: Diagnosis not present

## 2018-06-21 DIAGNOSIS — E118 Type 2 diabetes mellitus with unspecified complications: Secondary | ICD-10-CM | POA: Diagnosis not present

## 2018-06-21 DIAGNOSIS — R5383 Other fatigue: Secondary | ICD-10-CM | POA: Diagnosis not present

## 2018-06-21 DIAGNOSIS — Z794 Long term (current) use of insulin: Secondary | ICD-10-CM | POA: Diagnosis not present

## 2018-06-21 DIAGNOSIS — Z780 Asymptomatic menopausal state: Secondary | ICD-10-CM | POA: Diagnosis not present

## 2018-06-21 LAB — BASIC METABOLIC PANEL
BUN: 17 (ref 4–21)
CREATININE: 1.2 — AB (ref 0.5–1.1)

## 2018-06-21 LAB — HEMOGLOBIN A1C: Hgb A1c MFr Bld: 8.8 — AB (ref 4.0–6.0)

## 2018-06-21 LAB — TSH: TSH: 6.7 — AB (ref 0.41–5.90)

## 2018-06-22 ENCOUNTER — Encounter (INDEPENDENT_AMBULATORY_CARE_PROVIDER_SITE_OTHER): Payer: Self-pay

## 2018-06-25 ENCOUNTER — Other Ambulatory Visit: Payer: Self-pay

## 2018-06-25 ENCOUNTER — Emergency Department (HOSPITAL_COMMUNITY)
Admission: EM | Admit: 2018-06-25 | Discharge: 2018-06-25 | Disposition: A | Discharge status: Home / Self Care | Payer: Medicare Other | Attending: Emergency Medicine | Admitting: Emergency Medicine

## 2018-06-25 ENCOUNTER — Encounter (HOSPITAL_COMMUNITY): Payer: Self-pay

## 2018-06-25 ENCOUNTER — Other Ambulatory Visit: Payer: Self-pay | Admitting: "Endocrinology

## 2018-06-25 DIAGNOSIS — I10 Essential (primary) hypertension: Secondary | ICD-10-CM | POA: Diagnosis not present

## 2018-06-25 DIAGNOSIS — Z7984 Long term (current) use of oral hypoglycemic drugs: Secondary | ICD-10-CM | POA: Diagnosis not present

## 2018-06-25 DIAGNOSIS — Z79899 Other long term (current) drug therapy: Secondary | ICD-10-CM | POA: Diagnosis not present

## 2018-06-25 DIAGNOSIS — E119 Type 2 diabetes mellitus without complications: Secondary | ICD-10-CM | POA: Diagnosis not present

## 2018-06-25 DIAGNOSIS — G8929 Other chronic pain: Secondary | ICD-10-CM | POA: Diagnosis not present

## 2018-06-25 DIAGNOSIS — M545 Low back pain, unspecified: Secondary | ICD-10-CM

## 2018-06-25 MED ORDER — CYCLOBENZAPRINE HCL 10 MG PO TABS
10.0000 mg | ORAL_TABLET | Freq: Once | ORAL | Status: AC
  Administered 2018-06-25: 10 mg via ORAL
  Filled 2018-06-25: qty 1

## 2018-06-25 MED ORDER — TIZANIDINE HCL 4 MG PO CAPS: 4.0000 mg | ORAL_CAPSULE | Freq: Two times a day (BID) | ORAL | 0 refills | Status: AC | PRN

## 2018-06-25 MED ORDER — KETOROLAC TROMETHAMINE 30 MG/ML IJ SOLN
30.0000 mg | Freq: Once | INTRAMUSCULAR | Status: AC
  Administered 2018-06-25: 30 mg via INTRAMUSCULAR
  Filled 2018-06-25: qty 1

## 2018-06-25 NOTE — Discharge Instructions (Addendum)
Your pain today is from a muscle spasm. I have written you a prescription for Zanaflex, a muscle relaxer, which you were on in the past. This is useful for muscle spasms.   Please continue to take your pain medications as prescribed. You may also use warm or cold compresses for additional relief.   You may follow-up with your PCP or pain management provider for further care.

## 2018-06-25 NOTE — ED Provider Notes (Signed)
[facility] EMERGENCY DEPARTMENT Provider Note  CSN: [id number] Arrival date & time: 06/25/18  1905    History   Chief Complaint Chief Complaint  Patient presents with  . Back Pain    chronic    HPI [name] is a 57 y.o. female with a medical history of chronic back pain, Type 2 DM with neuropathy, GERD, HTN and cirrhosis who presented to the ED for right side low back pain x3 days. Patient describes sharp, aching in her low back bilaterally, but states right > left without radiation. Pain is worse with walking and movement. Relieved with rest and lying down. She denies any falls, traumas or injuries recently. She reports being managed by her pain clinic for her back pain because she is not a candidate for surgery and has never had surgery in the past. Denies fever, other arthralgias, skin rashes/lesions, bowel or bladder incontinence, saddle anesthesia, paresthesias, weakness or gait difficulties. Patient has taken her normal prescribed pain medications prior to coming to the ED.  Past Medical History:  Diagnosis Date  . Anxiety disorder   . Chronic low back pain   . Cirrhosis of liver ([name])   . Colitis   . Diabetes mellitus    Type II  . Diabetic neuropathy ([name])   . Diverticulitis   . Fatty liver   . GERD (gastroesophageal reflux disease)   . High cholesterol   . Hypertension   . Irritable bowel syndrome   . Meniere's disease   . Neuropathy   . Pain     Patient Active Problem List   Diagnosis Date Noted  . Sprain of anterior talofibular ligament of left ankle   . Ankle instability, left   . Synovitis and tenosynovitis   . Tear of peroneal tendon   . Peroneal tendinitis of left lower extremity   . Uncontrolled type 2 diabetes mellitus with complication, with long-term current use of insulin ([place]) 01/12/2018  . Mixed hyperlipidemia 05/14/2017  . Gastroesophageal reflux disease without esophagitis 02/11/2017  . History of colonic polyps 02/11/2017  . Nausea  without vomiting 02/11/2017  . Irritable bowel syndrome 02/11/2017  . Hepatic cirrhosis ([name]) 02/11/2017  . Pain in left foot 01/22/2017  . Pain in right foot 01/22/2017  . Metatarsal stress fracture of left foot 11/06/2016  . Diabetic polyneuropathy associated with type 2 diabetes mellitus ([name]) 11/06/2016  . Anxiety disorder 09/19/2016  . Reaction to severe stress 09/19/2016  . Hypercholesteremia 07/15/2016  . Class 1 obesity due to excess calories with serious comorbidity and body mass index (BMI) of 33.0 to 33.9 in adult 04/29/2016  . Nausea and vomiting 01/17/2014  . Essential hypertension 10/25/2013  . Fatty liver disease, nonalcoholic 34/19/6222  . IBS (irritable bowel syndrome) 02/24/2012  . Type 2 diabetes mellitus, uncontrolled ([name]) 02/24/2012  . Meniere's disease 12/09/2011    Past Surgical History:  Procedure Laterality Date  . ABDOMINAL HYSTERECTOMY    . BIOPSY  03/20/2017   Procedure: BIOPSY;  Surgeon: Heather Houston, MD;  Location: [facility];  Service: Endoscopy;;  colon gastric  . bladder tact  2005  . CHOLECYSTECTOMY  08/11  . COLONOSCOPY  2208  . COLONOSCOPY  In of September 2014   @ [facility]/Dr,.[name]  . COLONOSCOPY WITH PROPOFOL N/A 03/20/2017   Procedure: COLONOSCOPY WITH PROPOFOL;  Surgeon: Heather Houston, MD;  Location: [facility];  Service: Endoscopy;  Laterality: N/A;  . ECTOPIC PREGNANCY SURGERY  1996  . ESOPHAGOGASTRODUODENOSCOPY (EGD) WITH PROPOFOL N/A  03/20/2017   Procedure: ESOPHAGOGASTRODUODENOSCOPY (EGD) WITH PROPOFOL;  Surgeon: Heather Houston, MD;  Location: [facility];  Service: Endoscopy;  Laterality: N/A;  12:45  . HERNIA REPAIR  09/17/11  . LIGAMENT REPAIR Left 02/10/2018   Procedure: Left  ANKLE ATFL Repair and Ligament Augmentation, Injection of Tendon Splint Application;  Surgeon: [name], DPM;  Location: [facility];  Service: Podiatry;  Laterality: Left;  . [place]  2009   Preformed on the right shoulder  . OVARIAN CYST  REMOVAL     right side  . PARTIAL HYSTERECTOMY  2005  . POLYPECTOMY  03/20/2017   Procedure: POLYPECTOMY;  Surgeon: Heather Houston, MD;  Location: [facility];  Service: Endoscopy;;  colon  . TENDON REPAIR Left 02/10/2018   Procedure: PERONEAL TENDON REPAIR and Synovectomy Peroneal Tendon;  Surgeon: [name], DPM;  Location: [facility];  Service: Podiatry;  Laterality: Left;  . UPPER GASTROINTESTINAL ENDOSCOPY  2008  . URETERAL STENT PLACEMENT       OB History   None      Home Medications    Prior to Admission medications   Medication Sig Start Date End Date Taking? Authorizing Provider  acetaZOLAMIDE (DIAMOX) 250 MG tablet Take 250 mg by mouth daily.  04/28/16   [provider]  acyclovir (ZOVIRAX) 400 MG tablet Take 400 mg by mouth 3 (three) times daily as needed (for fever blisters).  06/25/16   [provider]  atorvastatin (LIPITOR) 20 MG tablet TAKE ONE TABLET BY MOUTH DAILY. 03/29/18   Heather Anger, MD  Blood Glucose Monitoring Suppl (FREESTYLE LITE) DEVI Used to monitor blood glucose. 05/28/17   Heather Anger, MD  Cholecalciferol (VITAMIN D3) 2000 units capsule Take 4,000 Units by mouth daily.     [provider]  ciclopirox (PENLAC) 8 % solution Apply topically at bedtime. Apply over nail and surrounding skin. Apply daily over previous coat. Remove weekly with file or polish remover. 10/21/17   [name], DPM  Continuous Blood Gluc Sensor (FREESTYLE LIBRE SENSOR SYSTEM) MISC Use one sensor every 10 days. 05/28/17   Heather Anger, MD  cyanocobalamin (,VITAMIN B-12,) 1000 MCG/ML injection Inject 1,000 mcg into the muscle every 30 (thirty) days. 04/14/16   [provider]  cyclobenzaprine (FLEXERIL) 10 MG tablet Take 10 mg by mouth every 8 (eight) hours as needed for muscle spasms. 01/28/18   [provider]  diazepam (VALIUM) 2 MG tablet Take 2 mg by mouth every 12 (twelve) hours as needed for anxiety.   04/07/16   [provider]  dicyclomine (BENTYL) 10 MG capsule Take 20 mg by mouth 3 (three) times daily as needed for spasms.    [provider]  DULoxetine (CYMBALTA) 60 MG capsule Take 60 mg by mouth daily. 04/28/16   [provider]  estradiol (ESTRACE) 0.5 MG tablet Take 0.5 mg by mouth every evening.  01/27/17   [provider]  ferrous sulfate 325 (65 FE) MG tablet Take 1 tablet (325 mg total) by mouth daily. Patient taking differently: Take 325 mg by mouth 2 (two) times a week.  06/26/16   Triplett, Tammy, PA-C  fluticasone (FLONASE) 50 MCG/ACT nasal spray Place 2 sprays into both nostrils every morning.     [provider]  glucose blood (FREESTYLE LITE) test strip Use as instructed 05/28/17   Heather Anger, MD  metFORMIN (GLUCOPHAGE) 1000 MG tablet TAKE ONE TABLET BY MOUTH TWICE DAILY WITH A MEAL  03/25/18   Heather Anger, MD  metoprolol (LOPRESSOR) 50 MG tablet Take 25 mg by mouth daily.     [provider]  Multiple Vitamins-Minerals (WOMENS MULTI PO) Take 1 tablet by mouth daily.     [provider]  ondansetron (ZOFRAN) 4 MG tablet Take 1 tablet (4 mg total) by mouth every 8 (eight) hours as needed for nausea or vomiting. 09/24/16   Rolland Porter, MD  oxyCODONE-acetaminophen (PERCOCET) 10-325 MG tablet Take 1 tablet by mouth every 4 (four) hours as needed for pain. 02/19/18   [name], DPM  pantoprazole (PROTONIX) 40 MG tablet TAKE ONE TABLET BY MOUTH DAILY (STOP OMEPRAZOLE) 01/13/17   [provider]  PARoxetine (PAXIL) 40 MG tablet Take 40 mg by mouth daily.  07/22/17   [provider]  Polyethyl Glycol-Propyl Glycol (SYSTANE OP) Place 1 drop into both eyes daily.    [provider]  PRODIGY TWIST TOP LANCETS 28G MISC TEST TWICE DAILY 06/25/17   Heather Anger, MD  promethazine (PHENERGAN) 25 MG tablet Take 1 tablet (25 mg total) by mouth every 8 (eight) hours as needed for  nausea or vomiting. 02/10/18   [name], DPM  tiZANidine (ZANAFLEX) 4 MG capsule Take 1 capsule (4 mg total) by mouth 2 (two) times daily as needed for up to 10 days for muscle spasms. 06/25/18 07/05/18  Mortis, Gabrielle I, PA-C  TRESIBA FLEXTOUCH 200 UNIT/ML SOPN INJECT 60 UNITS AT BEDTIME. 04/06/18   Nida, Marella Chimes, MD  Turmeric 500 MG CAPS Take 500 mg by mouth daily.     [provider]    Family History Family History  Problem Relation Age of Onset  . Healthy Mother   . Heart disease Father   . Esophageal cancer Brother   . Healthy Son   . Healthy Son     Social History Social History   Tobacco Use  . Smoking status: Never Smoker  . Smokeless tobacco: Never Used  Substance Use Topics  . Alcohol use: No  . Drug use: No     Allergies   Flagyl [metronidazole hcl] and Nsaids   Review of Systems Review of Systems  Constitutional: Negative for fever.  Gastrointestinal: Negative.   Genitourinary: Negative.   Musculoskeletal: Positive for back pain. Negative for arthralgias, gait problem, joint swelling and neck pain.  Skin: Negative.   Neurological: Negative for weakness and numbness.   Physical Exam Updated Vital Signs BP (!) 149/85 (BP Location: Right Arm)   Pulse 80   Temp 98.1 F (36.7 C) (Oral)   Resp 20   Ht 5' 2"  (1.575 m)   Wt 76.7 kg   SpO2 97%   BMI 30.91 kg/m   Physical Exam  Constitutional: She appears well-developed and well-nourished.  Neck: Normal range of motion and full passive range of motion without pain. Neck supple. No spinous process tenderness and no muscular tenderness present. Normal range of motion present.  Musculoskeletal:  Full ROM of upper and lower extremities bilaterally with 5/5 strength.  Negative straight leg raises bilaterally. Bilateral lumbar spinal muscle tenderness with right > left. Muscle spasm palpated in right lumbar region. No midline or bony tenderness. Able to ambulate and bear weight without  issue or assistance.  Neurological: She has normal strength. No sensory deficit. She exhibits normal muscle tone. Gait normal.  Reflex Scores:      Patellar reflexes are 2+ on the right side and 2+ on the left side.  Achilles reflexes are 2+ on the right side and 2+ on the left side. Skin: Skin is warm and intact. Capillary refill takes less than 2 seconds.  Nursing note and vitals reviewed.  ED Treatments / Results  Labs (all labs ordered are listed, but only abnormal results are displayed) Labs Reviewed - No data to display  EKG None  Radiology No results found.  Procedures Procedures (including critical care time)  Medications Ordered in ED Medications  ketorolac (TORADOL) 30 MG/ML injection 30 mg (has no administration in time range)  cyclobenzaprine (FLEXERIL) tablet 10 mg (has no administration in time range)     Initial Impression / Assessment and Plan / ED Course  Triage vital signs and the nursing notes have been reviewed.  Pertinent labs & imaging results that were available during care of the patient were reviewed and considered in medical decision making (see chart for details).   Patient presents well appearing and in no acute distress. She is able to ambulate on her own and does so without assistance or issue. Patient has history of chronic back problems which includes DJD and spinal stenosis. No prior surgeries. She denies preceding injury, unusual movements or excessive lifting prior to this acute episode. There have been no new or severe traumas, falls or injuries. Physical exam is reassuring. With the exception of muscular tenderness, her MSK and neuro exam is normal. There is no midline tenderness, deformities or abnormal neuro findings on exam or in history to suggest an acute spinal cord or osseus pathology that warrant imaging today. No systemic s/s to suggest underlying infectious or rheumatologic etiology.   Final Clinical Impressions(s) / ED Diagnoses   1. Chronic Back Pain.  Current active Rx in [place] Controlled Substance Database. Education provided on OTC and supportive treatment for relief. Rx for Zanaflex given for muscle spasms. Advised to follow-up with pain management.  Dispo: Home. After thorough clinical evaluation, this patient is determined to be medically stable and can be safely discharged with the previously mentioned treatment and/or outpatient follow-up/referral(s). At this time, there are no other apparent medical conditions that require further screening, evaluation or treatment.   Final diagnoses:  Chronic bilateral low back pain without sciatica    ED Discharge Orders         Ordered    tiZANidine (ZANAFLEX) 4 MG capsule  2 times daily PRN     06/25/18 2047            Romie Jumper, PA-C 06/25/18 2047    Francine Graven, DO 06/26/18 1559

## 2018-06-25 NOTE — ED Triage Notes (Signed)
Back pain  3-4 days.

## 2018-06-29 ENCOUNTER — Encounter: Payer: Self-pay | Admitting: "Endocrinology

## 2018-06-29 ENCOUNTER — Ambulatory Visit (INDEPENDENT_AMBULATORY_CARE_PROVIDER_SITE_OTHER): Payer: Medicare Other | Admitting: "Endocrinology

## 2018-06-29 VITALS — BP 133/84 | HR 69 | Ht 62.0 in | Wt 174.0 lb

## 2018-06-29 DIAGNOSIS — Z794 Long term (current) use of insulin: Secondary | ICD-10-CM

## 2018-06-29 DIAGNOSIS — Z6833 Body mass index (BMI) 33.0-33.9, adult: Secondary | ICD-10-CM

## 2018-06-29 DIAGNOSIS — IMO0002 Reserved for concepts with insufficient information to code with codable children: Secondary | ICD-10-CM

## 2018-06-29 DIAGNOSIS — E1165 Type 2 diabetes mellitus with hyperglycemia: Secondary | ICD-10-CM | POA: Diagnosis not present

## 2018-06-29 DIAGNOSIS — I10 Essential (primary) hypertension: Secondary | ICD-10-CM | POA: Diagnosis not present

## 2018-06-29 DIAGNOSIS — E118 Type 2 diabetes mellitus with unspecified complications: Secondary | ICD-10-CM

## 2018-06-29 DIAGNOSIS — E782 Mixed hyperlipidemia: Secondary | ICD-10-CM | POA: Diagnosis not present

## 2018-06-29 DIAGNOSIS — E6609 Other obesity due to excess calories: Secondary | ICD-10-CM | POA: Diagnosis not present

## 2018-06-29 DIAGNOSIS — E039 Hypothyroidism, unspecified: Secondary | ICD-10-CM | POA: Diagnosis not present

## 2018-06-29 MED ORDER — METFORMIN HCL 500 MG PO TABS: 500.0000 mg | ORAL_TABLET | Freq: Two times a day (BID) | ORAL | 3 refills | Status: DC

## 2018-06-29 MED ORDER — LEVOTHYROXINE SODIUM 50 MCG PO TABS: 50.0000 ug | ORAL_TABLET | Freq: Every day | ORAL | 3 refills | Status: DC

## 2018-06-29 MED ORDER — TRESIBA FLEXTOUCH 200 UNIT/ML ~~LOC~~ SOPN: 66.0000 [IU] | PEN_INJECTOR | Freq: Every day | SUBCUTANEOUS | 2 refills | Status: DC

## 2018-06-29 NOTE — Progress Notes (Signed)
Endocrinology follow-up note   Subjective:    Patient ID: [name], female    DOB: May 10, 1961. Patient is being seen in f/u for management of the uncontrolled type 2 diabetes, hyperlipidemia, hypertension. PMD: Glenda Chroman, MD  Past Medical History:  Diagnosis Date  . Anxiety disorder   . Chronic low back pain   . Cirrhosis of liver ([place])   . Colitis   . Diabetes mellitus    Type II  . Diabetic neuropathy ([place])   . Diverticulitis   . Fatty liver   . GERD (gastroesophageal reflux disease)   . High cholesterol   . Hypertension   . Irritable bowel syndrome   . Meniere's disease   . Neuropathy   . Pain    Past Surgical History:  Procedure Laterality Date  . ABDOMINAL HYSTERECTOMY    . BIOPSY  03/20/2017   Procedure: BIOPSY;  Surgeon: [name], MD;  [facility];  Service: Endoscopy;;  colon gastric  . bladder tact  2005  . CHOLECYSTECTOMY  08/11  . COLONOSCOPY  2208  . COLONOSCOPY  In of September 2014   @ [facility]/Dr,.[name]  . COLONOSCOPY WITH PROPOFOL N/A 03/20/2017   Procedure: COLONOSCOPY WITH PROPOFOL;  Surgeon: [name], MD;  [facility];  Service: Endoscopy;  Laterality: N/A;  . ECTOPIC PREGNANCY SURGERY  1996  . ESOPHAGOGASTRODUODENOSCOPY (EGD) WITH PROPOFOL N/A 03/20/2017   Procedure: ESOPHAGOGASTRODUODENOSCOPY (EGD) WITH PROPOFOL;  Surgeon: [name], MD;  [facility];  Service: Endoscopy;  Laterality: N/A;  12:45  . HERNIA REPAIR  09/17/11  . LIGAMENT REPAIR Left 02/10/2018   Procedure: Left  ANKLE ATFL Repair and Ligament Augmentation, Injection of Tendon Splint Application;  Surgeon: [name], DPM;  Location: [place];  Service: Podiatry;  Laterality: Left;  . [place]  2009   Preformed on the right shoulder  . OVARIAN CYST REMOVAL     right side  . PARTIAL HYSTERECTOMY  2005  . POLYPECTOMY  03/20/2017   Procedure: POLYPECTOMY;  Surgeon: [name], MD;  [facility];  Service:  Endoscopy;;  colon  . TENDON REPAIR Left 02/10/2018   Procedure: PERONEAL TENDON REPAIR and Synovectomy Peroneal Tendon;  Surgeon: [name], DPM;  Location: [place];  Service: Podiatry;  Laterality: Left;  . UPPER GASTROINTESTINAL ENDOSCOPY  2008  . URETERAL STENT PLACEMENT     Social History   Socioeconomic History  . Marital status: Divorced    Spouse name: Not on file  . Number of children: Not on file  . Years of education: Not on file  . Highest education level: Not on file  Occupational History  . Not on file  Social Needs  . Financial resource strain: Not on file  . Food insecurity:    Worry: Not on file    Inability: Not on file  . Transportation needs:    Medical: Not on file    Non-medical: Not on file  Tobacco Use  . Smoking status: Never Smoker  . Smokeless tobacco: Never Used  Substance and Sexual Activity  . Alcohol use: No  . Drug use: No  . Sexual activity: Yes    Birth control/protection: Surgical  Lifestyle  . Physical activity:    Days per week: Not on file    Minutes per session: Not on file  . Stress: Not on file  Relationships  . Social connections:    Talks on phone: Not  on file    Gets together: Not on file    Attends religious service: Not on file    Active member of club or organization: Not on file    Attends meetings of clubs or organizations: Not on file    Relationship status: Not on file  Other Topics Concern  . Not on file  Social History Narrative  . Not on file   Outpatient Encounter Medications as of 06/29/2018  Medication Sig  . acetaZOLAMIDE (DIAMOX) 250 MG tablet Take 250 mg by mouth daily.   Marland Kitchen acyclovir (ZOVIRAX) 400 MG tablet Take 400 mg by mouth 3 (three) times daily as needed (for fever blisters).   Marland Kitchen atorvastatin (LIPITOR) 20 MG tablet TAKE ONE TABLET BY MOUTH DAILY.  Marland Kitchen Blood Glucose Monitoring Suppl (FREESTYLE LITE) DEVI Used to monitor blood glucose.  . Cholecalciferol (VITAMIN D3) 2000 units capsule Take 4,000  Units by mouth daily.   . ciclopirox (PENLAC) 8 % solution Apply topically at bedtime. Apply over nail and surrounding skin. Apply daily over previous coat. Remove weekly with file or polish remover.  . Continuous Blood Gluc Sensor (FREESTYLE LIBRE SENSOR SYSTEM) MISC Use one sensor every 10 days.  . cyanocobalamin (,VITAMIN B-12,) 1000 MCG/ML injection Inject 1,000 mcg into the muscle every 30 (thirty) days.  . cyclobenzaprine (FLEXERIL) 10 MG tablet Take 10 mg by mouth every 8 (eight) hours as needed for muscle spasms.  . diazepam (VALIUM) 2 MG tablet Take 2 mg by mouth every 12 (twelve) hours as needed for anxiety.   . dicyclomine (BENTYL) 10 MG capsule Take 20 mg by mouth 3 (three) times daily as needed for spasms.  . DULoxetine (CYMBALTA) 60 MG capsule Take 60 mg by mouth daily.  Marland Kitchen estradiol (ESTRACE) 0.5 MG tablet Take 0.5 mg by mouth every evening.   . ferrous sulfate 325 (65 FE) MG tablet Take 1 tablet (325 mg total) by mouth daily. (Patient taking differently: Take 325 mg by mouth 2 (two) times a week. )  . fluticasone (FLONASE) 50 MCG/ACT nasal spray Place 2 sprays into both nostrils every morning.   Marland Kitchen glucose blood (FREESTYLE LITE) test strip Use as instructed  . metFORMIN (GLUCOPHAGE) 500 MG tablet Take 1 tablet (500 mg total) by mouth 2 (two) times daily with a meal.  . metoprolol (LOPRESSOR) 50 MG tablet Take 25 mg by mouth daily.   . Multiple Vitamins-Minerals (WOMENS MULTI PO) Take 1 tablet by mouth daily.   . ondansetron (ZOFRAN) 4 MG tablet Take 1 tablet (4 mg total) by mouth every 8 (eight) hours as needed for nausea or vomiting.  Marland Kitchen oxyCODONE-acetaminophen (PERCOCET) 10-325 MG tablet Take 1 tablet by mouth every 4 (four) hours as needed for pain.  . pantoprazole (PROTONIX) 40 MG tablet TAKE ONE TABLET BY MOUTH DAILY (STOP OMEPRAZOLE)  . PARoxetine (PAXIL) 40 MG tablet Take 40 mg by mouth daily.   Vladimir Faster Glycol-Propyl Glycol (SYSTANE OP) Place 1 drop into both eyes daily.   Marland Kitchen PRODIGY TWIST TOP LANCETS 28G MISC TEST TWICE DAILY  . promethazine (PHENERGAN) 25 MG tablet Take 1 tablet (25 mg total) by mouth every 8 (eight) hours as needed for nausea or vomiting.  Marland Kitchen tiZANidine (ZANAFLEX) 4 MG capsule Take 1 capsule (4 mg total) by mouth 2 (two) times daily as needed for up to 10 days for muscle spasms.  . TRESIBA FLEXTOUCH 200 UNIT/ML SOPN Inject 66 Units into the skin at bedtime.  . Turmeric 500 MG CAPS Take 500  mg by mouth daily.   . [DISCONTINUED] metFORMIN (GLUCOPHAGE) 1000 MG tablet TAKE ONE TABLET BY MOUTH TWICE DAILY WITH A MEAL  . [DISCONTINUED] TRESIBA FLEXTOUCH 200 UNIT/ML SOPN INJECT 60 UNITS AT BEDTIME.   No facility-administered encounter medications on file as of 06/29/2018.    ALLERGIES: Allergies  Allergen Reactions  . Flagyl [Metronidazole Hcl] Itching and Rash  . Nsaids Nausea Only   VACCINATION STATUS: Immunization History  Administered Date(s) Administered  . Influenza Inj Mdck Quad Pf 08/07/2017    Diabetes  She presents for her follow-up diabetic visit. She has type 2 diabetes mellitus. Onset time: She was diagnosed approximately at age 29 years. Her disease course has been improving. There are no hypoglycemic associated symptoms. Pertinent negatives for hypoglycemia include no confusion, headaches, pallor or seizures. Associated symptoms include fatigue. Pertinent negatives for diabetes include no chest pain, no polydipsia, no polyphagia and no polyuria. There are no hypoglycemic complications. Symptoms are improving. There are no diabetic complications. Risk factors for coronary artery disease include diabetes mellitus, dyslipidemia, hypertension, obesity, sedentary lifestyle and family history. Current diabetic treatment includes oral agent (monotherapy) and intensive insulin program (She is on Tresiba 50 units  subcutaneously daily at bedtime, metformin 1000 g by mouth twice a day. ). Her weight is increasing steadily. She is following a  generally unhealthy diet. When asked about meal planning, she reported none. She has not had a previous visit with a dietitian. She never participates in exercise. Her home blood glucose trend is decreasing steadily. Her breakfast blood glucose range is generally 140-180 mg/dl. Her dinner blood glucose range is generally 140-180 mg/dl. Her overall blood glucose range is 140-180 mg/dl. An ACE inhibitor/angiotensin II receptor blocker is not being taken. Eye exam is current.  Hypertension  This is a chronic problem. The current episode started more than 1 year ago. The problem is controlled. Pertinent negatives include no chest pain, headaches, palpitations or shortness of breath. Risk factors for coronary artery disease include diabetes mellitus, obesity and sedentary lifestyle. Past treatments include beta blockers. Compliance problems include diet and exercise.   Hyperlipidemia  This is a chronic problem. The current episode started more than 1 year ago. The problem is controlled. Exacerbating diseases include diabetes and obesity. Pertinent negatives include no chest pain, myalgias or shortness of breath. Current antihyperlipidemic treatment includes statins. Risk factors for coronary artery disease include diabetes mellitus, dyslipidemia, obesity, a sedentary lifestyle and post-menopausal.    Review of Systems  Constitutional: Positive for fatigue. Negative for unexpected weight change.  HENT: Negative for trouble swallowing and voice change.   Eyes: Negative for visual disturbance.  Respiratory: Negative for cough, shortness of breath and wheezing.   Cardiovascular: Negative for chest pain, palpitations and leg swelling.  Gastrointestinal: Negative for diarrhea, nausea and vomiting.  Endocrine: Negative for cold intolerance, heat intolerance, polydipsia, polyphagia and polyuria.  Genitourinary: Negative for dysuria, flank pain and frequency.  Musculoskeletal: Negative for arthralgias and  myalgias.  Skin: Negative for color change, pallor, rash and wound.  Neurological: Negative for seizures and headaches.  Psychiatric/Behavioral: Negative for confusion and suicidal ideas.    Objective:    BP 133/84   Pulse 69   Ht 5' 2"  (1.575 m)   Wt 174 lb (78.9 kg)   BMI 31.83 kg/m   Wt Readings from Last 3 Encounters:  06/29/18 174 lb (78.9 kg)  06/25/18 169 lb (76.7 kg)  02/10/18 179 lb (81.2 kg)    Physical Exam  Constitutional: She is  oriented to person, place, and time. She appears well-developed.  Obese.  HENT:  Head: Normocephalic and atraumatic.  Eyes: EOM are normal.  Neck: Normal range of motion. Neck supple. No tracheal deviation present. No thyromegaly present.  Cardiovascular: Normal rate.  Pulmonary/Chest: Effort normal.  Abdominal: There is no tenderness. There is no guarding.  Musculoskeletal: Normal range of motion. She exhibits no edema.  Neurological: She is alert and oriented to person, place, and time. She has normal reflexes. No cranial nerve deficit. Coordination normal.  Skin: Skin is warm and dry. No rash noted. No erythema. No pallor.  Psychiatric: She has a normal mood and affect. Judgment normal.    CMP     Component Value Date/Time   NA 138 05/04/2018 1504   K 4.7 05/04/2018 1504   CL 106 05/04/2018 1504   CO2 24 05/04/2018 1504   GLUCOSE 316 (H) 05/04/2018 1504   BUN 17 06/21/2018   CREATININE 1.2 (A) 06/21/2018   CREATININE 0.80 05/04/2018 1504   CALCIUM 9.5 05/04/2018 1504   PROT 6.7 05/04/2018 1504   ALBUMIN 3.4 (L) 10/13/2017 1013   AST 34 05/04/2018 1504   ALT 24 05/04/2018 1504   ALKPHOS 86 10/13/2017 1013   BILITOT 0.5 05/04/2018 1504   GFRNONAA 82 05/04/2018 1504   GFRAA 96 05/04/2018 1504     Diabetic Labs (most recent): Lab Results  Component Value Date   HGBA1C 8.8 (A) 06/21/2018   HGBA1C 9.6 (H) 05/04/2018   HGBA1C 9.3 (H) 02/03/2018   Lipid Panel     Component Value Date/Time   CHOL 146 10/13/2017 0816    TRIG 90 10/13/2017 0816   HDL 49 (L) 10/13/2017 0816   CHOLHDL 3.0 10/13/2017 0816   VLDL 29 06/30/2016 1251   LDLCALC 80 10/13/2017 0816   Recent Results (from the past 2160 hour(s))  COMPLETE METABOLIC PANEL WITH GFR     Status: Abnormal   Collection Time: 05/04/18  3:04 PM  Result Value Ref Range   Glucose, Bld 316 (H) 65 - 139 mg/dL    Comment: .        Non-fasting reference interval .    BUN 18 7 - 25 mg/dL   Creat 0.80 0.50 - 1.05 mg/dL    Comment: For patients >49 years of age, the reference limit for Creatinine is approximately 13% higher for people identified as African-American. .    GFR, Est Non African American 82 > OR = 60 mL/min/1.61m   GFR, Est African American 96 > OR = 60 mL/min/1.782m  BUN/Creatinine Ratio NOT APPLICABLE 6 - 22 (calc)   Sodium 138 135 - 146 mmol/L   Potassium 4.7 3.5 - 5.3 mmol/L   Chloride 106 98 - 110 mmol/L   CO2 24 20 - 32 mmol/L   Calcium 9.5 8.6 - 10.4 mg/dL   Total Protein 6.7 6.1 - 8.1 g/dL   Albumin 3.7 3.6 - 5.1 g/dL   Globulin 3.0 1.9 - 3.7 g/dL (calc)   AG Ratio 1.2 1.0 - 2.5 (calc)   Total Bilirubin 0.5 0.2 - 1.2 mg/dL   Alkaline phosphatase (APISO) 129 33 - 130 U/L   AST 34 10 - 35 U/L   ALT 24 6 - 29 U/L  Hemoglobin A1c     Status: Abnormal   Collection Time: 05/04/18  3:04 PM  Result Value Ref Range   Hgb A1c MFr Bld 9.6 (H) <5.7 % of total Hgb    Comment: For someone without known diabetes,  a hemoglobin A1c value of 6.5% or greater indicates that they may have  diabetes and this should be confirmed with a follow-up  test. . For someone with known diabetes, a value <7% indicates  that their diabetes is well controlled and a value  greater than or equal to 7% indicates suboptimal  control. A1c targets should be individualized based on  duration of diabetes, age, comorbid conditions, and  other considerations. . Currently, no consensus exists regarding use of hemoglobin A1c for diagnosis of diabetes for  children. .    Mean Plasma Glucose 229 (calc)   eAG (mmol/L) 12.7 (calc)  Basic metabolic panel     Status: Abnormal   Collection Time: 06/21/18 12:00 AM  Result Value Ref Range   BUN 17 4 - 21   Creatinine 1.2 (A) 0.5 - 1.1  Hemoglobin A1c     Status: Abnormal   Collection Time: 06/21/18 12:00 AM  Result Value Ref Range   Hgb A1c MFr Bld 8.8 (A) 4.0 - 6.0  TSH     Status: Abnormal   Collection Time: 06/21/18 12:00 AM  Result Value Ref Range   TSH 6.70 (A) 0.41 - 5.90     Assessment & Plan:   1. Uncontrolled type 2 diabetes mellitus with complication, with long-term current use of insulin ([place]) - Patient has currently uncontrolled symptomatic type 2 DM since  57 years of age. -Her previsit labs show A1c of 8.8%, improving from last visit A1c of 9.6%.     Recent labs reviewed.   Her diabetes is complicated by obesity, insulin resistance, and patient remains at a high risk for more acute and chronic complications of diabetes which include CAD, CVA, CKD, retinopathy, and neuropathy. These are all discussed in detail with the patient.  - I have counseled the patient on diet management and weight loss, by adopting a carbohydrate restricted/protein rich diet.  -  Suggestion is made for her to avoid simple carbohydrates  from her diet including Cakes, Sweet Desserts / Pastries, Ice Cream, Soda (diet and regular), Sweet Tea, Candies, Chips, Cookies, Store Bought Juices, Alcohol in Excess of  1-2 drinks a day, Artificial Sweeteners, and "Sugar-free" Products. This will help patient to have stable blood glucose profile and potentially avoid unintended weight gain.  - I encouraged the patient to switch to  unprocessed or minimally processed complex starch and increased protein intake (animal or plant source), fruits, and vegetables.  - Patient is advised to stick to a routine mealtimes to eat 3 meals  a day and avoid unnecessary snacks ( to snack only to correct hypoglycemia).  - She  declines referral to CDE for now.  - I have approached patient with the following individualized plan to manage diabetes and patient agrees:   -  Based on her recent glycemic response, she will not require prandial insulin for now.   - I advised her to increase his [place] to 65 units nightly, associated with strict monitoring of blood glucose 2 times a day-before breakfast and at bedtime.   - Continue to hold prandial insulin for now.   -Patient is encouraged to call clinic for blood glucose levels less than 70 or above 200 mg /dl. -Due to rising serum creatinine, I advised her to lower her metformin to 500 mg p.o. twice daily after breakfast and supper, still therapeutically suitable for patient. -   her insurance declines to cover for continuous glucose monitoring device.   -  She has history of intolerance  for [place], likely would not tolerate other  incretin therapy.  - Patient specific target  A1c;  LDL, HDL, Triglycerides, and  Waist Circumference were discussed in detail.  2) BP/HTN: Her blood pressure is controlled to target.    She is advised to continue her current medications.  3) Lipids/HPL: Her recent lab showed controlled LDL of 80, generally improving from 141.   I  advised her to continue atorvastatin 20 mg p.o. nightly.   Side effects and precautions discussed with her.  4)  Weight/Diet: She has declined CD consult. exercise, and detailed carbohydrates information provided.  5) hypothyroidism: New diagnosis for her. I discussed and initiated levothyroxine 50 mcg p.o. every morning.  - We discussed about correct intake of levothyroxine, at fasting, with water, separated by at least 30 minutes from breakfast, and separated by more than 4 hours from calcium, iron, multivitamins, acid reflux medications (PPIs). -Patient is made aware of the fact that thyroid hormone replacement is needed for life, dose to be adjusted by periodic monitoring of thyroid function tests.  6)  Chronic Care/Health Maintenance:  -Patient  will be considered for ACE inhibitor's and statins as appropriate, and encouraged to continue to follow up with Ophthalmology, Podiatrist at least yearly or according to recommendations, and advised to   stay away from smoking. I have recommended yearly flu vaccine and pneumonia vaccination at least every 5 years; moderate intensity exercise for up to 150 minutes weekly; and  sleep for at least 7 hours a day.  - I advised patient to maintain close follow up with Glenda Chroman, MD for primary care needs.   - Time spent with the patient: 25 min, of which >50% was spent in reviewing her blood glucose logs , discussing her hypo- and hyper-glycemic episodes, reviewing her current and  previous labs and insulin doses and developing a plan to avoid hypo- and hyper-glycemia. Please refer to Patient Instructions for Blood Glucose Monitoring and Insulin/Medications Dosing Guide"  in media tab for additional information. [name] participated in the discussions, expressed understanding, and voiced agreement with the above plans.  All questions were answered to her satisfaction. she is encouraged to contact clinic should she have any questions or concerns prior to her return visit.    Follow up plan: - Return in about 3 months (around 09/28/2018) for Meter, and Logs.  Glade Lloyd, MD Phone: [phone]  Fax: [phone]  This note was partially dictated with voice recognition software. Similar sounding words can be transcribed inadequately or may not  be corrected upon review.  06/29/2018, 3:15 PM

## 2018-06-29 NOTE — Patient Instructions (Signed)

## 2018-07-01 ENCOUNTER — Telehealth (INDEPENDENT_AMBULATORY_CARE_PROVIDER_SITE_OTHER): Payer: Self-pay | Admitting: *Deleted

## 2018-07-01 ENCOUNTER — Ambulatory Visit (INDEPENDENT_AMBULATORY_CARE_PROVIDER_SITE_OTHER): Payer: Medicare Other | Admitting: Podiatry

## 2018-07-01 DIAGNOSIS — M7672 Peroneal tendinitis, left leg: Secondary | ICD-10-CM | POA: Diagnosis not present

## 2018-07-01 DIAGNOSIS — Z9119 Patient's noncompliance with other medical treatment and regimen: Secondary | ICD-10-CM | POA: Diagnosis not present

## 2018-07-01 DIAGNOSIS — R600 Localized edema: Secondary | ICD-10-CM | POA: Diagnosis not present

## 2018-07-01 DIAGNOSIS — M25372 Other instability, left ankle: Secondary | ICD-10-CM | POA: Diagnosis not present

## 2018-07-01 DIAGNOSIS — E039 Hypothyroidism, unspecified: Secondary | ICD-10-CM | POA: Diagnosis not present

## 2018-07-01 DIAGNOSIS — S93402S Sprain of unspecified ligament of left ankle, sequela: Secondary | ICD-10-CM

## 2018-07-01 DIAGNOSIS — Z91199 Patient's noncompliance with other medical treatment and regimen due to unspecified reason: Secondary | ICD-10-CM

## 2018-07-01 DIAGNOSIS — L905 Scar conditions and fibrosis of skin: Secondary | ICD-10-CM | POA: Diagnosis not present

## 2018-07-01 DIAGNOSIS — R52 Pain, unspecified: Secondary | ICD-10-CM

## 2018-07-01 DIAGNOSIS — N289 Disorder of kidney and ureter, unspecified: Secondary | ICD-10-CM | POA: Diagnosis not present

## 2018-07-01 NOTE — Telephone Encounter (Signed)
Patient came by, asking about being scheduled for an elastography -- I do not see any note about this -- please advise

## 2018-07-01 NOTE — Progress Notes (Signed)
Subjective:  Patient ID: [name], female    DOB: Aug 28, 1961,  MRN: [id number]  Chief Complaint  Patient presents with  . Routine Post Op    peroneal tendon repair left   57 y.o. female presents with the above complaint.  Underwent peroneal tendon repair and ankle stabilization back in May.  Currently doing very well with only minimum swelling.  Reports some pain along the incision from time to time.  Review of Systems: Negative except as noted in the HPI. Denies N/V/F/Ch.  Past Medical History:  Diagnosis Date  . Anxiety disorder   . Chronic low back pain   . Cirrhosis of liver ([place])   . Colitis   . Diabetes mellitus    Type II  . Diabetic neuropathy ([place])   . Diverticulitis   . Fatty liver   . GERD (gastroesophageal reflux disease)   . High cholesterol   . Hypertension   . Irritable bowel syndrome   . Meniere's disease   . Neuropathy   . Pain     Current Outpatient Medications:  .  NON FORMULARY, [facility]  Scar Cream -  Verapamil 10%, Pentoxifylline 5% Apply 1-2 grams to affected area 3-4 times daily Qty. 120 gm 3 refills, Disp: , Rfl:  .  acetaZOLAMIDE (DIAMOX) 250 MG tablet, Take 250 mg by mouth daily. , Disp: , Rfl:  .  acyclovir (ZOVIRAX) 400 MG tablet, Take 400 mg by mouth 3 (three) times daily as needed (for fever blisters). , Disp: , Rfl:  .  atorvastatin (LIPITOR) 20 MG tablet, TAKE ONE TABLET BY MOUTH DAILY., Disp: 90 tablet, Rfl: 0 .  Blood Glucose Monitoring Suppl (FREESTYLE LITE) DEVI, Used to monitor blood glucose., Disp: 1 each, Rfl: 0 .  Cholecalciferol (VITAMIN D3) 2000 units capsule, Take 4,000 Units by mouth daily. , Disp: , Rfl:  .  ciclopirox (PENLAC) 8 % solution, Apply topically at bedtime. Apply over nail and surrounding skin. Apply daily over previous coat. Remove weekly with file or polish remover., Disp: 6.6 mL, Rfl: 0 .  Continuous Blood Gluc Sensor ([place]) MISC, Use one sensor every 10 days., Disp: 3 each,  Rfl: 2 .  cyanocobalamin (,VITAMIN B-12,) 1000 MCG/ML injection, Inject 1,000 mcg into the muscle every 30 (thirty) days., Disp: , Rfl: 5 .  cyclobenzaprine (FLEXERIL) 10 MG tablet, Take 10 mg by mouth every 8 (eight) hours as needed for muscle spasms., Disp: , Rfl: 2 .  diazepam (VALIUM) 2 MG tablet, Take 2 mg by mouth every 12 (twelve) hours as needed for anxiety. , Disp: , Rfl:  .  dicyclomine (BENTYL) 10 MG capsule, Take 20 mg by mouth 3 (three) times daily as needed for spasms., Disp: , Rfl:  .  DULoxetine (CYMBALTA) 60 MG capsule, Take 60 mg by mouth daily., Disp: , Rfl:  .  estradiol (ESTRACE) 0.5 MG tablet, Take 0.5 mg by mouth every evening. , Disp: , Rfl:  .  ferrous sulfate 325 (65 FE) MG tablet, Take 1 tablet (325 mg total) by mouth daily. (Patient taking differently: Take 325 mg by mouth 2 (two) times a week. ), Disp: 30 tablet, Rfl: 0 .  fluticasone (FLONASE) 50 MCG/ACT nasal spray, Place 2 sprays into both nostrils every morning. , Disp: , Rfl:  .  glucose blood (FREESTYLE LITE) test strip, Use as instructed, Disp: 100 each, Rfl: 2 .  levothyroxine (SYNTHROID, LEVOTHROID) 50 MCG tablet, Take 1 tablet (50 mcg total) by mouth daily before breakfast.,  Disp: 30 tablet, Rfl: 3 .  metFORMIN (GLUCOPHAGE) 500 MG tablet, Take 1 tablet (500 mg total) by mouth 2 (two) times daily with a meal., Disp: 60 tablet, Rfl: 3 .  metoprolol (LOPRESSOR) 50 MG tablet, Take 25 mg by mouth daily. , Disp: , Rfl:  .  Multiple Vitamins-Minerals (WOMENS MULTI PO), Take 1 tablet by mouth daily. , Disp: , Rfl:  .  ondansetron (ZOFRAN) 4 MG tablet, Take 1 tablet (4 mg total) by mouth every 8 (eight) hours as needed for nausea or vomiting., Disp: 12 tablet, Rfl: 0 .  oxyCODONE-acetaminophen (PERCOCET) 10-325 MG tablet, Take 1 tablet by mouth every 4 (four) hours as needed for pain., Disp: 20 tablet, Rfl: 0 .  pantoprazole (PROTONIX) 40 MG tablet, TAKE ONE TABLET BY MOUTH DAILY (STOP OMEPRAZOLE), Disp: , Rfl: 4 .   PARoxetine (PAXIL) 40 MG tablet, Take 40 mg by mouth daily. , Disp: , Rfl:  .  Polyethyl Glycol-Propyl Glycol (SYSTANE OP), Place 1 drop into both eyes daily., Disp: , Rfl:  .  PRODIGY TWIST TOP LANCETS 28G MISC, TEST TWICE DAILY, Disp: 200 each, Rfl: 2 .  promethazine (PHENERGAN) 25 MG tablet, Take 1 tablet (25 mg total) by mouth every 8 (eight) hours as needed for nausea or vomiting., Disp: 20 tablet, Rfl: 0 .  tiZANidine (ZANAFLEX) 4 MG capsule, Take 1 capsule (4 mg total) by mouth 2 (two) times daily as needed for up to 10 days for muscle spasms., Disp: 20 capsule, Rfl: 0 .  TRESIBA FLEXTOUCH 200 UNIT/ML SOPN, Inject 66 Units into the skin at bedtime., Disp: 9 mL, Rfl: 2 .  Turmeric 500 MG CAPS, Take 500 mg by mouth daily. , Disp: , Rfl:   Social History   Tobacco Use  Smoking Status Never Smoker  Smokeless Tobacco Never Used    Allergies  Allergen Reactions  . Flagyl [Metronidazole Hcl] Itching and Rash  . Nsaids Nausea Only   Objective:  There were no vitals filed for this visit. There is no height or weight on file to calculate BMI. Constitutional Well developed. Well nourished.  Vascular Dorsalis pedis pulses palpable bilaterally. Posterior tibial pulses palpable bilaterally. Capillary refill normal to all digits.  No cyanosis or clubbing noted. Pedal hair growth normal.  Neurologic Normal speech. Oriented to person, place, and time. Epicritic sensation to light touch grossly present bilaterally.  Dermatologic Nails well groomed and normal in appearance. No open wounds. Well-healed thin scar with some areas of scar tissue formation  Orthopedic: Normal joint ROM without pain or crepitus bilaterally. No visible deformities. No bony tenderness.   Radiographs: None today Assessment:   1. Peroneal tendonitis of left lower extremity   2. Ankle instability, left   3. Moderate left ankle sprain, sequela   4. Non-compliance   5. Localized edema   6. Scar tissue   7.  Scar pain    Plan:  Patient was evaluated and treated and all questions answered.  Status post left peroneal tendon repair and ankle instability with symptomatic scar -Doing very well postoperatively with only symptomatic scar causing issues today.  She was noncompliant during postop.  And missed several appointments. -Rx for compound scar cream -Educated on soft tissue massage the area -Educated that swelling may go down with time. -Advised to follow-up should issues persist.  Return if symptoms worsen or fail to improve.

## 2018-07-01 NOTE — Telephone Encounter (Signed)
Patient is requesting Rx for Bentyl 6 a day instead of 4 ([facility])

## 2018-07-01 NOTE — Telephone Encounter (Signed)
I called patient and told her we could not increase to 6 a day

## 2018-07-02 ENCOUNTER — Other Ambulatory Visit: Payer: Self-pay | Admitting: Neurology

## 2018-07-02 DIAGNOSIS — M5416 Radiculopathy, lumbar region: Secondary | ICD-10-CM

## 2018-07-07 ENCOUNTER — Encounter (INDEPENDENT_AMBULATORY_CARE_PROVIDER_SITE_OTHER): Payer: Self-pay

## 2018-07-07 ENCOUNTER — Other Ambulatory Visit (INDEPENDENT_AMBULATORY_CARE_PROVIDER_SITE_OTHER): Payer: Self-pay | Admitting: *Deleted

## 2018-07-07 DIAGNOSIS — K760 Fatty (change of) liver, not elsewhere classified: Secondary | ICD-10-CM

## 2018-07-07 DIAGNOSIS — K7460 Unspecified cirrhosis of liver: Secondary | ICD-10-CM

## 2018-07-07 DIAGNOSIS — Z79899 Other long term (current) drug therapy: Secondary | ICD-10-CM

## 2018-07-07 DIAGNOSIS — R112 Nausea with vomiting, unspecified: Secondary | ICD-10-CM

## 2018-07-07 NOTE — Telephone Encounter (Signed)
Yes please schedule patient for abdominal ultrasound with elastography. This was supposed to be done 1 year ago. Follow-up for chronic liver disease/hepatic fibrosis.

## 2018-07-07 NOTE — Telephone Encounter (Signed)
[place] sch'd 07/16/18 at 830 ([id number]), Nothing to eat or drink after midnight the night before the test Sent MyChart message to patient with appt info

## 2018-07-16 ENCOUNTER — Ambulatory Visit (HOSPITAL_COMMUNITY): Payer: Medicare Other | Attending: Internal Medicine

## 2018-07-22 ENCOUNTER — Telehealth: Payer: Self-pay | Admitting: Podiatry

## 2018-07-22 MED ORDER — MELOXICAM 15 MG PO TABS: 15.0000 mg | ORAL_TABLET | Freq: Every day | ORAL | 0 refills | Status: DC

## 2018-07-22 NOTE — Addendum Note (Signed)
Addended by: Harriett Sine D on: 07/22/2018 03:21 PM   Modules accepted: Orders

## 2018-07-22 NOTE — Telephone Encounter (Signed)
Pt states the pain is a little different from the surgery pain and different site. I told pt Dr. March Rummage had wanted to see her to evaluate especially if the pain was different and he ordered Meloxicam 7m #30 one tablet every day. I informed pt of Dr. PEleanora Neighbororders and transferred to schedulers.

## 2018-07-22 NOTE — Telephone Encounter (Signed)
Pt left ankle is killing her again, she needs some pain medication. She can not stand the pain no longer

## 2018-07-22 NOTE — Telephone Encounter (Signed)
Left message requesting more information concerning her left ankle pain. I spoke with Dr. March Rummage and he states if pt is continuing to have pain she needs an appt and ordered Meloxicam 81m #30 one tablet daily.

## 2018-07-26 DIAGNOSIS — I10 Essential (primary) hypertension: Secondary | ICD-10-CM | POA: Diagnosis not present

## 2018-07-26 DIAGNOSIS — Z79891 Long term (current) use of opiate analgesic: Secondary | ICD-10-CM | POA: Diagnosis not present

## 2018-07-26 DIAGNOSIS — M461 Sacroiliitis, not elsewhere classified: Secondary | ICD-10-CM | POA: Diagnosis not present

## 2018-07-26 DIAGNOSIS — M545 Low back pain: Secondary | ICD-10-CM | POA: Diagnosis not present

## 2018-07-26 DIAGNOSIS — M5415 Radiculopathy, thoracolumbar region: Secondary | ICD-10-CM | POA: Diagnosis not present

## 2018-07-29 ENCOUNTER — Ambulatory Visit: Payer: Medicare Other | Admitting: Podiatry

## 2018-08-09 ENCOUNTER — Other Ambulatory Visit: Payer: Self-pay | Admitting: "Endocrinology

## 2018-08-11 DIAGNOSIS — Z23 Encounter for immunization: Secondary | ICD-10-CM | POA: Diagnosis not present

## 2018-08-19 ENCOUNTER — Other Ambulatory Visit: Payer: Self-pay | Admitting: Nurse Practitioner

## 2018-08-19 DIAGNOSIS — M545 Low back pain, unspecified: Secondary | ICD-10-CM

## 2018-08-19 DIAGNOSIS — G8929 Other chronic pain: Secondary | ICD-10-CM

## 2018-08-23 ENCOUNTER — Other Ambulatory Visit: Payer: Self-pay | Admitting: "Endocrinology

## 2018-08-30 ENCOUNTER — Ambulatory Visit
Admission: RE | Admit: 2018-08-30 | Discharge: 2018-08-30 | Disposition: A | Discharge status: Home / Self Care | Payer: Medicare Other | Source: Ambulatory Visit | Attending: Nurse Practitioner | Admitting: Nurse Practitioner

## 2018-08-30 DIAGNOSIS — M545 Low back pain, unspecified: Secondary | ICD-10-CM

## 2018-08-30 DIAGNOSIS — G8929 Other chronic pain: Secondary | ICD-10-CM

## 2018-08-30 MED ORDER — IOPAMIDOL (ISOVUE-M 200) INJECTION 41%
1.0000 mL | Freq: Once | INTRAMUSCULAR | Status: AC
  Administered 2018-08-30: 1 mL via EPIDURAL

## 2018-08-30 MED ORDER — METHYLPREDNISOLONE ACETATE 40 MG/ML INJ SUSP (RADIOLOG
120.0000 mg | Freq: Once | INTRAMUSCULAR | Status: AC
  Administered 2018-08-30: 120 mg via EPIDURAL

## 2018-08-30 NOTE — Discharge Instructions (Signed)

## 2018-08-31 DIAGNOSIS — Z6834 Body mass index (BMI) 34.0-34.9, adult: Secondary | ICD-10-CM | POA: Diagnosis not present

## 2018-08-31 DIAGNOSIS — I10 Essential (primary) hypertension: Secondary | ICD-10-CM | POA: Diagnosis not present

## 2018-08-31 DIAGNOSIS — R05 Cough: Secondary | ICD-10-CM | POA: Diagnosis not present

## 2018-08-31 DIAGNOSIS — Z789 Other specified health status: Secondary | ICD-10-CM | POA: Diagnosis not present

## 2018-08-31 DIAGNOSIS — Z299 Encounter for prophylactic measures, unspecified: Secondary | ICD-10-CM | POA: Diagnosis not present

## 2018-08-31 DIAGNOSIS — R5383 Other fatigue: Secondary | ICD-10-CM | POA: Diagnosis not present

## 2018-08-31 DIAGNOSIS — J45909 Unspecified asthma, uncomplicated: Secondary | ICD-10-CM | POA: Diagnosis not present

## 2018-09-01 DIAGNOSIS — J45909 Unspecified asthma, uncomplicated: Secondary | ICD-10-CM | POA: Diagnosis not present

## 2018-09-03 DIAGNOSIS — B373 Candidiasis of vulva and vagina: Secondary | ICD-10-CM | POA: Diagnosis not present

## 2018-09-03 DIAGNOSIS — Z299 Encounter for prophylactic measures, unspecified: Secondary | ICD-10-CM | POA: Diagnosis not present

## 2018-09-03 DIAGNOSIS — H8103 Meniere's disease, bilateral: Secondary | ICD-10-CM | POA: Diagnosis not present

## 2018-09-03 DIAGNOSIS — Z6831 Body mass index (BMI) 31.0-31.9, adult: Secondary | ICD-10-CM | POA: Diagnosis not present

## 2018-09-03 DIAGNOSIS — I10 Essential (primary) hypertension: Secondary | ICD-10-CM | POA: Diagnosis not present

## 2018-09-03 DIAGNOSIS — J42 Unspecified chronic bronchitis: Secondary | ICD-10-CM | POA: Diagnosis not present

## 2018-09-07 ENCOUNTER — Ambulatory Visit (INDEPENDENT_AMBULATORY_CARE_PROVIDER_SITE_OTHER): Payer: Medicare Other | Admitting: Internal Medicine

## 2018-09-17 DIAGNOSIS — E1165 Type 2 diabetes mellitus with hyperglycemia: Secondary | ICD-10-CM | POA: Diagnosis not present

## 2018-09-17 DIAGNOSIS — E118 Type 2 diabetes mellitus with unspecified complications: Secondary | ICD-10-CM | POA: Diagnosis not present

## 2018-09-17 DIAGNOSIS — E039 Hypothyroidism, unspecified: Secondary | ICD-10-CM | POA: Diagnosis not present

## 2018-09-17 DIAGNOSIS — Z794 Long term (current) use of insulin: Secondary | ICD-10-CM | POA: Diagnosis not present

## 2018-09-17 LAB — COMPLETE METABOLIC PANEL WITH GFR
AG Ratio: 1.2 (calc) (ref 1.0–2.5)
ALT: 30 U/L — AB (ref 6–29)
AST: 27 U/L (ref 10–35)
Albumin: 3.7 g/dL (ref 3.6–5.1)
Alkaline phosphatase (APISO): 104 U/L (ref 33–130)
BUN: 17 mg/dL (ref 7–25)
CALCIUM: 9.3 mg/dL (ref 8.6–10.4)
CO2: 26 mmol/L (ref 20–32)
Chloride: 107 mmol/L (ref 98–110)
Creat: 0.74 mg/dL (ref 0.50–1.05)
GFR, EST AFRICAN AMERICAN: 105 mL/min/{1.73_m2} (ref >=60)
GFR, EST NON AFRICAN AMERICAN: 91 mL/min/{1.73_m2} (ref >=60)
Globulin: 3.2 g/dL (calc) (ref 1.9–3.7)
Glucose, Bld: 172 mg/dL — ABNORMAL HIGH (ref 65–139)
Potassium: 4.5 mmol/L (ref 3.5–5.3)
Sodium: 140 mmol/L (ref 135–146)
TOTAL PROTEIN: 6.9 g/dL (ref 6.1–8.1)
Total Bilirubin: 0.6 mg/dL (ref 0.2–1.2)

## 2018-09-17 LAB — TSH: TSH: 3.05 mIU/L (ref 0.40–4.50)

## 2018-09-17 LAB — HEMOGLOBIN A1C
EAG (MMOL/L): 12.8 (calc)
Hgb A1c MFr Bld: 9.7 % of total Hgb — ABNORMAL HIGH (ref <5.7)
Mean Plasma Glucose: 232 (calc)

## 2018-09-17 LAB — T4, FREE: FREE T4: 1 ng/dL (ref 0.8–1.8)

## 2018-09-21 ENCOUNTER — Other Ambulatory Visit: Payer: Self-pay | Admitting: "Endocrinology

## 2018-09-21 ENCOUNTER — Telehealth: Payer: Self-pay | Admitting: Podiatry

## 2018-09-21 MED ORDER — METFORMIN HCL 500 MG PO TABS: 500.0000 mg | ORAL_TABLET | Freq: Two times a day (BID) | ORAL | 3 refills | Status: DC

## 2018-09-21 NOTE — Telephone Encounter (Signed)
Left message for pt to call for an appt, go back in to the tall surgical boot, ice the area 3-4 times a day for 15 minutes protecting the skin from the ice with a light cloth.

## 2018-09-21 NOTE — Telephone Encounter (Signed)
Pt had surgery back in May and 3-4 days ago patient started experiencing pain in foot near surgery area. Please give pt a call

## 2018-09-24 ENCOUNTER — Encounter: Payer: Self-pay | Admitting: "Endocrinology

## 2018-09-24 ENCOUNTER — Ambulatory Visit (INDEPENDENT_AMBULATORY_CARE_PROVIDER_SITE_OTHER): Payer: Medicare Other | Admitting: "Endocrinology

## 2018-09-24 VITALS — BP 140/81 | HR 103 | Ht 62.0 in | Wt 175.0 lb

## 2018-09-24 DIAGNOSIS — E782 Mixed hyperlipidemia: Secondary | ICD-10-CM | POA: Diagnosis not present

## 2018-09-24 DIAGNOSIS — I10 Essential (primary) hypertension: Secondary | ICD-10-CM

## 2018-09-24 DIAGNOSIS — E039 Hypothyroidism, unspecified: Secondary | ICD-10-CM

## 2018-09-24 DIAGNOSIS — E118 Type 2 diabetes mellitus with unspecified complications: Secondary | ICD-10-CM | POA: Diagnosis not present

## 2018-09-24 DIAGNOSIS — Z794 Long term (current) use of insulin: Secondary | ICD-10-CM | POA: Diagnosis not present

## 2018-09-24 DIAGNOSIS — E1165 Type 2 diabetes mellitus with hyperglycemia: Secondary | ICD-10-CM

## 2018-09-24 DIAGNOSIS — IMO0002 Reserved for concepts with insufficient information to code with codable children: Secondary | ICD-10-CM

## 2018-09-24 MED ORDER — LEVOTHYROXINE SODIUM 75 MCG PO TABS: ORAL_TABLET | ORAL | 3 refills | Status: DC

## 2018-09-24 NOTE — Progress Notes (Signed)
Endocrinology follow-up note   Subjective:    Patient ID: [name], female    DOB: 1961/08/09. Patient is being seen in f/u for management of the uncontrolled type 2 diabetes, hyperlipidemia, hypothyroidism, hypertension. PMD: Glenda Chroman, MD  Past Medical History:  Diagnosis Date  . Anxiety disorder   . Chronic low back pain   . Cirrhosis of liver ([place])   . Colitis   . Diabetes mellitus    Type II  . Diabetic neuropathy ([place])   . Diverticulitis   . Fatty liver   . GERD (gastroesophageal reflux disease)   . High cholesterol   . Hypertension   . Irritable bowel syndrome   . Meniere's disease   . Neuropathy   . Pain    Past Surgical History:  Procedure Laterality Date  . ABDOMINAL HYSTERECTOMY    . BIOPSY  03/20/2017   Procedure: BIOPSY;  Surgeon: [name], MD;  [facility];  Service: Endoscopy;;  colon gastric  . bladder tact  2005  . CHOLECYSTECTOMY  08/11  . COLONOSCOPY  2208  . COLONOSCOPY  In of September 2014   @ [facility]/Dr,.[name]  . COLONOSCOPY WITH PROPOFOL N/A 03/20/2017   Procedure: COLONOSCOPY WITH PROPOFOL;  Surgeon: [name], MD;  [facility];  Service: Endoscopy;  Laterality: N/A;  . ECTOPIC PREGNANCY SURGERY  1996  . ESOPHAGOGASTRODUODENOSCOPY (EGD) WITH PROPOFOL N/A 03/20/2017   Procedure: ESOPHAGOGASTRODUODENOSCOPY (EGD) WITH PROPOFOL;  Surgeon: [name], MD;  [facility];  Service: Endoscopy;  Laterality: N/A;  12:45  . HERNIA REPAIR  09/17/11  . LIGAMENT REPAIR Left 02/10/2018   Procedure: Left  ANKLE ATFL Repair and Ligament Augmentation, Injection of Tendon Splint Application;  Surgeon: [name], DPM;  Location: [place];  Service: Podiatry;  Laterality: Left;  . [place]  2009   Preformed on the right shoulder  . OVARIAN CYST REMOVAL     right side  . PARTIAL HYSTERECTOMY  2005  . POLYPECTOMY  03/20/2017   Procedure: POLYPECTOMY;  Surgeon: [name], MD;  [facility];  Service: Endoscopy;;  colon  . TENDON REPAIR Left 02/10/2018   Procedure: PERONEAL TENDON REPAIR and Synovectomy Peroneal Tendon;  Surgeon: [name], DPM;  Location: [place];  Service: Podiatry;  Laterality: Left;  . UPPER GASTROINTESTINAL ENDOSCOPY  2008  . URETERAL STENT PLACEMENT     Social History   Socioeconomic History  . Marital status: Divorced    Spouse name: Not on file  . Number of children: Not on file  . Years of education: Not on file  . Highest education level: Not on file  Occupational History  . Not on file  Social Needs  . Financial resource strain: Not on file  . Food insecurity:    Worry: Not on file    Inability: Not on file  . Transportation needs:    Medical: Not on file    Non-medical: Not on file  Tobacco Use  . Smoking status: Never Smoker  . Smokeless tobacco: Never Used  Substance and Sexual Activity  . Alcohol use: No  . Drug use: No  . Sexual activity: Yes    Birth control/protection: Surgical  Lifestyle  . Physical activity:    Days per week: Not on file    Minutes per session: Not on file  . Stress: Not on file  Relationships  . Social connections:    Talks on phone:  Not on file    Gets together: Not on file    Attends religious service: Not on file    Active member of club or organization: Not on file    Attends meetings of clubs or organizations: Not on file    Relationship status: Not on file  Other Topics Concern  . Not on file  Social History Narrative  . Not on file   Outpatient Encounter Medications as of 09/24/2018  Medication Sig  . acetaZOLAMIDE (DIAMOX) 250 MG tablet Take 250 mg by mouth daily.   Marland Kitchen acyclovir (ZOVIRAX) 400 MG tablet Take 400 mg by mouth 3 (three) times daily as needed (for fever blisters).   Marland Kitchen atorvastatin (LIPITOR) 20 MG tablet TAKE ONE TABLET BY MOUTH DAILY.  Marland Kitchen Cholecalciferol (VITAMIN D3) 2000 units capsule Take 4,000 Units by mouth daily.   . cyanocobalamin (,VITAMIN B-12,) 1000 MCG/ML  injection Inject 1,000 mcg into the muscle every 30 (thirty) days.  . diazepam (VALIUM) 2 MG tablet Take 2 mg by mouth every 12 (twelve) hours as needed for anxiety.   . dicyclomine (BENTYL) 10 MG capsule Take 20 mg by mouth 3 (three) times daily as needed for spasms.  . DULoxetine (CYMBALTA) 60 MG capsule Take 60 mg by mouth daily.  Marland Kitchen estradiol (ESTRACE) 0.5 MG tablet Take 0.5 mg by mouth every evening.   . fluticasone (FLONASE) 50 MCG/ACT nasal spray Place 2 sprays into both nostrils every morning.   Marland Kitchen glucose blood (FREESTYLE LITE) test strip Use as instructed  . levothyroxine (SYNTHROID, LEVOTHROID) 75 MCG tablet TAKE ONE TABLET BY MOUTH DAILY BEFORE BREAKFAST  . metFORMIN (GLUCOPHAGE) 500 MG tablet Take 1 tablet (500 mg total) by mouth 2 (two) times daily with a meal.  . metoprolol (LOPRESSOR) 50 MG tablet Take 25 mg by mouth daily.   . Multiple Vitamins-Minerals (WOMENS MULTI PO) Take 1 tablet by mouth daily.   Salley Scarlet FORMULARY [facility]  Scar Cream -  Verapamil 10%, Pentoxifylline 5% Apply 1-2 grams to affected area 3-4 times daily Qty. 120 gm 3 refills  . ondansetron (ZOFRAN) 4 MG tablet Take 1 tablet (4 mg total) by mouth every 8 (eight) hours as needed for nausea or vomiting.  . pantoprazole (PROTONIX) 40 MG tablet TAKE ONE TABLET BY MOUTH DAILY (STOP OMEPRAZOLE)  . PARoxetine (PAXIL) 40 MG tablet Take 40 mg by mouth daily.   Vladimir Faster Glycol-Propyl Glycol (SYSTANE OP) Place 1 drop into both eyes daily.  Marland Kitchen PRODIGY TWIST TOP LANCETS 28G MISC TEST TWICE DAILY  . TRESIBA FLEXTOUCH 200 UNIT/ML SOPN INJECT 66 UNITS INTO THE SKIN AT BEDTIME.  . Turmeric 500 MG CAPS Take 500 mg by mouth daily.   . [DISCONTINUED] Blood Glucose Monitoring Suppl (FREESTYLE LITE) DEVI Used to monitor blood glucose.  . [DISCONTINUED] ciclopirox (PENLAC) 8 % solution Apply topically at bedtime. Apply over nail and surrounding skin. Apply daily over previous coat. Remove weekly with file or polish  remover.  . [DISCONTINUED] Continuous Blood Gluc Sensor (FREESTYLE LIBRE SENSOR SYSTEM) MISC Use one sensor every 10 days.  . [DISCONTINUED] cyclobenzaprine (FLEXERIL) 10 MG tablet Take 10 mg by mouth every 8 (eight) hours as needed for muscle spasms.  . [DISCONTINUED] ferrous sulfate 325 (65 FE) MG tablet Take 1 tablet (325 mg total) by mouth daily. (Patient taking differently: Take 325 mg by mouth 2 (two) times a week. )  . [DISCONTINUED] levothyroxine (SYNTHROID, LEVOTHROID) 50 MCG tablet TAKE ONE TABLET BY MOUTH DAILY BEFORE BREAKFAST  . [  DISCONTINUED] meloxicam (MOBIC) 15 MG tablet Take 1 tablet (15 mg total) by mouth daily.  . [DISCONTINUED] oxyCODONE-acetaminophen (PERCOCET) 10-325 MG tablet Take 1 tablet by mouth every 4 (four) hours as needed for pain.  . [DISCONTINUED] promethazine (PHENERGAN) 25 MG tablet Take 1 tablet (25 mg total) by mouth every 8 (eight) hours as needed for nausea or vomiting.   No facility-administered encounter medications on file as of 09/24/2018.    ALLERGIES: Allergies  Allergen Reactions  . Flagyl [Metronidazole Hcl] Itching and Rash  . Nsaids Nausea Only   VACCINATION STATUS: Immunization History  Administered Date(s) Administered  . Influenza Inj Mdck Quad Pf 08/07/2017    Diabetes  She presents for her follow-up diabetic visit. She has type 2 diabetes mellitus. Onset time: She was diagnosed approximately at age 76 years. Her disease course has been worsening. There are no hypoglycemic associated symptoms. Pertinent negatives for hypoglycemia include no confusion, headaches, pallor or seizures. Associated symptoms include fatigue. Pertinent negatives for diabetes include no chest pain, no polydipsia, no polyphagia and no polyuria. There are no hypoglycemic complications. Symptoms are worsening. There are no diabetic complications. Risk factors for coronary artery disease include diabetes mellitus, dyslipidemia, hypertension, obesity, sedentary lifestyle  and family history. Current diabetic treatment includes oral agent (monotherapy) and intensive insulin program (She is on Tresiba 50 units  subcutaneously daily at bedtime, metformin 1000 g by mouth twice a day. ). Her weight is increasing steadily. She is following a generally unhealthy diet. When asked about meal planning, she reported none. She has not had a previous visit with a dietitian. She never participates in exercise. Her breakfast blood glucose range is generally 110-130 mg/dl. Her bedtime blood glucose range is generally 110-130 mg/dl. Her overall blood glucose range is 110-130 mg/dl. An ACE inhibitor/angiotensin II receptor blocker is not being taken. Eye exam is current.  Hypertension  This is a chronic problem. The current episode started more than 1 year ago. The problem is controlled. Pertinent negatives include no chest pain, headaches, palpitations or shortness of breath. Risk factors for coronary artery disease include diabetes mellitus, obesity and sedentary lifestyle. Past treatments include beta blockers. Compliance problems include diet and exercise.   Hyperlipidemia  This is a chronic problem. The current episode started more than 1 year ago. The problem is controlled. Exacerbating diseases include diabetes and obesity. Pertinent negatives include no chest pain, myalgias or shortness of breath. Current antihyperlipidemic treatment includes statins. Risk factors for coronary artery disease include diabetes mellitus, dyslipidemia, obesity, a sedentary lifestyle and post-menopausal.    Review of Systems  Constitutional: Positive for fatigue. Negative for unexpected weight change.  HENT: Negative for trouble swallowing and voice change.   Eyes: Negative for visual disturbance.  Respiratory: Negative for cough, shortness of breath and wheezing.   Cardiovascular: Negative for chest pain, palpitations and leg swelling.  Gastrointestinal: Negative for diarrhea, nausea and vomiting.   Endocrine: Negative for cold intolerance, heat intolerance, polydipsia, polyphagia and polyuria.  Genitourinary: Negative for dysuria, flank pain and frequency.  Musculoskeletal: Negative for arthralgias and myalgias.  Skin: Negative for color change, pallor, rash and wound.  Neurological: Negative for seizures and headaches.  Psychiatric/Behavioral: Negative for confusion and suicidal ideas.    Objective:    BP 140/81   Pulse (!) 103   Ht 5' 2"  (1.575 m)   Wt 175 lb (79.4 kg)   BMI 32.01 kg/m   Wt Readings from Last 3 Encounters:  09/24/18 175 lb (79.4 kg)  06/29/18  174 lb (78.9 kg)  06/25/18 169 lb (76.7 kg)    Physical Exam  Constitutional: She is oriented to person, place, and time. She appears well-developed.  Obese.  HENT:  Head: Normocephalic and atraumatic.  Eyes: EOM are normal.  Neck: Normal range of motion. Neck supple. No tracheal deviation present. No thyromegaly present.  Cardiovascular: Normal rate.  Pulmonary/Chest: Effort normal.  Abdominal: There is no abdominal tenderness. There is no guarding.  Musculoskeletal: Normal range of motion.        General: No edema.  Neurological: She is alert and oriented to person, place, and time. She has normal reflexes. No cranial nerve deficit. Coordination normal.  Skin: Skin is warm and dry. No rash noted. No erythema. No pallor.  Psychiatric: She has a normal mood and affect. Judgment normal.    CMP     Component Value Date/Time   NA 140 09/17/2018 1222   K 4.5 09/17/2018 1222   CL 107 09/17/2018 1222   CO2 26 09/17/2018 1222   GLUCOSE 172 (H) 09/17/2018 1222   BUN 17 09/17/2018 1222   BUN 17 06/21/2018   CREATININE 0.74 09/17/2018 1222   CALCIUM 9.3 09/17/2018 1222   PROT 6.9 09/17/2018 1222   ALBUMIN 3.4 (L) 10/13/2017 1013   AST 27 09/17/2018 1222   ALT 30 (H) 09/17/2018 1222   ALKPHOS 86 10/13/2017 1013   BILITOT 0.6 09/17/2018 1222   GFRNONAA 91 09/17/2018 1222   GFRAA 105 09/17/2018 1222     Diabetic Labs (most recent): Lab Results  Component Value Date   HGBA1C 9.7 (H) 09/17/2018   HGBA1C 8.8 (A) 06/21/2018   HGBA1C 9.6 (H) 05/04/2018   Lipid Panel     Component Value Date/Time   CHOL 146 10/13/2017 0816   TRIG 90 10/13/2017 0816   HDL 49 (L) 10/13/2017 0816   CHOLHDL 3.0 10/13/2017 0816   VLDL 29 06/30/2016 1251   LDLCALC 80 10/13/2017 0816   Recent Results (from the past 2160 hour(s))  TSH     Status: None   Collection Time: 09/17/18 12:22 PM  Result Value Ref Range   TSH 3.05 0.40 - 4.50 mIU/L  T4, free     Status: None   Collection Time: 09/17/18 12:22 PM  Result Value Ref Range   Free T4 1.0 0.8 - 1.8 ng/dL  COMPLETE METABOLIC PANEL WITH GFR     Status: Abnormal   Collection Time: 09/17/18 12:22 PM  Result Value Ref Range   Glucose, Bld 172 (H) 65 - 139 mg/dL    Comment: .        Non-fasting reference interval .    BUN 17 7 - 25 mg/dL   Creat 0.74 0.50 - 1.05 mg/dL    Comment: For patients >22 years of age, the reference limit for Creatinine is approximately 13% higher for people identified as African-American. .    GFR, Est Non African American 91 > OR = 60 mL/min/1.3m   GFR, Est African American 105 > OR = 60 mL/min/1.776m  BUN/Creatinine Ratio NOT APPLICABLE 6 - 22 (calc)   Sodium 140 135 - 146 mmol/L   Potassium 4.5 3.5 - 5.3 mmol/L   Chloride 107 98 - 110 mmol/L   CO2 26 20 - 32 mmol/L   Calcium 9.3 8.6 - 10.4 mg/dL   Total Protein 6.9 6.1 - 8.1 g/dL   Albumin 3.7 3.6 - 5.1 g/dL   Globulin 3.2 1.9 - 3.7 g/dL (calc)   AG Ratio 1.2  1.0 - 2.5 (calc)   Total Bilirubin 0.6 0.2 - 1.2 mg/dL   Alkaline phosphatase (APISO) 104 33 - 130 U/L   AST 27 10 - 35 U/L   ALT 30 (H) 6 - 29 U/L  Hemoglobin A1c     Status: Abnormal   Collection Time: 09/17/18 12:22 PM  Result Value Ref Range   Hgb A1c MFr Bld 9.7 (H) <5.7 % of total Hgb    Comment: For someone without known diabetes, a hemoglobin A1c value of 6.5% or greater indicates that  they may have  diabetes and this should be confirmed with a follow-up  test. . For someone with known diabetes, a value <7% indicates  that their diabetes is well controlled and a value  greater than or equal to 7% indicates suboptimal  control. A1c targets should be individualized based on  duration of diabetes, age, comorbid conditions, and  other considerations. . Currently, no consensus exists regarding use of hemoglobin A1c for diagnosis of diabetes for children. .    Mean Plasma Glucose 232 (calc)   eAG (mmol/L) 12.8 (calc)     Assessment & Plan:   1. Uncontrolled type 2 diabetes mellitus with complication, with long-term current use of insulin ([place]) - Patient has currently uncontrolled symptomatic type 2 DM since  57 years of age. -Her previsit labs show A1c of 9.7% increasing from 8.8% during her last visit.  This is mainly due to her interval exposure to steroids related to her back pain.    Recent labs reviewed.   Her diabetes is complicated by obesity, insulin resistance, and patient remains at a high risk for more acute and chronic complications of diabetes which include CAD, CVA, CKD, retinopathy, and neuropathy. These are all discussed in detail with the patient.  - I have counseled the patient on diet management and weight loss, by adopting a carbohydrate restricted/protein rich diet.  -  Suggestion is made for her to avoid simple carbohydrates  from her diet including Cakes, Sweet Desserts / Pastries, Ice Cream, Soda (diet and regular), Sweet Tea, Candies, Chips, Cookies, Store Bought Juices, Alcohol in Excess of  1-2 drinks a day, Artificial Sweeteners, and "Sugar-free" Products. This will help patient to have stable blood glucose profile and potentially avoid unintended weight gain.   - I encouraged the patient to switch to  unprocessed or minimally processed complex starch and increased protein intake (animal or plant source), fruits, and vegetables.  - Patient  is advised to stick to a routine mealtimes to eat 3 meals  a day and avoid unnecessary snacks ( to snack only to correct hypoglycemia).  - She declines referral to CDE for now.  - I have approached patient with the following individualized plan to manage diabetes and patient agrees:   -She had interval course of hyperglycemia which led to higher A1c, however most recently the last 2 weeks her blood glucose readings are near target.   -She is approached to start monitoring blood glucose 4 times a day-before meals and at bedtime and drop of her logs in 2 weeks.  If she has postprandial significant hypoglycemia, will be considered for intensive treatment with basal/bolus insulin. -However, based on her most recent readings, she would not tolerate any more insulin.  She is advised to continue Tresiba 65 units nightly, associated with strict monitoring of blood glucose 2 times a day-before breakfast and at bedtime.   - Continue to hold prandial insulin for now.    -Patient is encouraged  to call clinic for blood glucose levels less than 70 or above 200 mg /dl. -Her renal function is back to normal.  She is advised to continue metformin 500 mg p.o. twice daily after breakfast and supper,  therapeutically suitable for patient. -   her insurance declines to cover for continuous glucose monitoring device.   -  She has history of intolerance for [place], likely would not tolerate other  incretin therapy.  - Patient specific target  A1c;  LDL, HDL, Triglycerides, and  Waist Circumference were discussed in detail.  2) BP/HTN: Her blood pressure is controlled to target.   She is advised to continue her current medications.  3) Lipids/HPL: Her recent lab showed controlled LDL of 80, generally improving from 141.   I  advised her to continue atorvastatin 20 mg p.o. nightly.   Side effects and precautions discussed with her.  4)  Weight/Diet: She has declined CD consult. exercise, and detailed carbohydrates  information provided.  5) hypothyroidism:  -She would benefit from slight increase in her levothyroxine.  I discussed and increase levothyroxine to 75 mcg p.o. before breakfast.   - We discussed about correct intake of levothyroxine, at fasting, with water, separated by at least 30 minutes from breakfast, and separated by more than 4 hours from calcium, iron, multivitamins, acid reflux medications (PPIs). -Patient is made aware of the fact that thyroid hormone replacement is needed for life, dose to be adjusted by periodic monitoring of thyroid function tests. 6) Chronic Care/Health Maintenance:  -Patient  will be considered for ACE inhibitor's and statins as appropriate, and encouraged to continue to follow up with Ophthalmology, Podiatrist at least yearly or according to recommendations, and advised to   stay away from smoking. I have recommended yearly flu vaccine and pneumonia vaccination at least every 5 years; moderate intensity exercise for up to 150 minutes weekly; and  sleep for at least 7 hours a day.  - I advised patient to maintain close follow up with Glenda Chroman, MD for primary care needs.   - Time spent with the patient: 25 min, of which >50% was spent in reviewing her blood glucose logs , discussing her hypo- and hyper-glycemic episodes, reviewing her current and  previous labs and insulin doses and developing a plan to avoid hypo- and hyper-glycemia. Please refer to Patient Instructions for Blood Glucose Monitoring and Insulin/Medications Dosing Guide"  in media tab for additional information. [name] participated in the discussions, expressed understanding, and voiced agreement with the above plans.  All questions were answered to her satisfaction. she is encouraged to contact clinic should she have any questions or concerns prior to her return visit.   Follow up plan: - Return in about 3 months (around 12/24/2018) for Follow up with Pre-visit Labs, Meter, and  Logs.  Glade Lloyd, MD Phone: [phone]  Fax: [phone]  This note was partially dictated with voice recognition software. Similar sounding words can be transcribed inadequately or may not  be corrected upon review.  09/24/2018, 11:22 AM

## 2018-09-24 NOTE — Patient Instructions (Signed)

## 2018-09-26 DIAGNOSIS — M25551 Pain in right hip: Secondary | ICD-10-CM | POA: Diagnosis not present

## 2018-09-26 DIAGNOSIS — R51 Headache: Secondary | ICD-10-CM | POA: Diagnosis not present

## 2018-09-26 DIAGNOSIS — M25561 Pain in right knee: Secondary | ICD-10-CM | POA: Diagnosis not present

## 2018-09-26 DIAGNOSIS — S8991XA Unspecified injury of right lower leg, initial encounter: Secondary | ICD-10-CM | POA: Diagnosis not present

## 2018-09-26 DIAGNOSIS — S79911A Unspecified injury of right hip, initial encounter: Secondary | ICD-10-CM | POA: Diagnosis not present

## 2018-09-26 DIAGNOSIS — I10 Essential (primary) hypertension: Secondary | ICD-10-CM | POA: Diagnosis not present

## 2018-09-26 DIAGNOSIS — S0990XA Unspecified injury of head, initial encounter: Secondary | ICD-10-CM | POA: Diagnosis not present

## 2018-10-07 DIAGNOSIS — Z6831 Body mass index (BMI) 31.0-31.9, adult: Secondary | ICD-10-CM | POA: Diagnosis not present

## 2018-10-07 DIAGNOSIS — I10 Essential (primary) hypertension: Secondary | ICD-10-CM | POA: Diagnosis not present

## 2018-10-07 DIAGNOSIS — J45909 Unspecified asthma, uncomplicated: Secondary | ICD-10-CM | POA: Diagnosis not present

## 2018-10-07 DIAGNOSIS — E1165 Type 2 diabetes mellitus with hyperglycemia: Secondary | ICD-10-CM | POA: Diagnosis not present

## 2018-10-07 DIAGNOSIS — M25561 Pain in right knee: Secondary | ICD-10-CM | POA: Diagnosis not present

## 2018-10-11 ENCOUNTER — Other Ambulatory Visit (INDEPENDENT_AMBULATORY_CARE_PROVIDER_SITE_OTHER): Payer: Self-pay | Admitting: Internal Medicine

## 2018-10-18 DIAGNOSIS — M25551 Pain in right hip: Secondary | ICD-10-CM | POA: Diagnosis not present

## 2018-10-18 DIAGNOSIS — M25561 Pain in right knee: Secondary | ICD-10-CM | POA: Diagnosis not present

## 2018-10-27 ENCOUNTER — Encounter: Payer: Self-pay | Admitting: "Endocrinology

## 2018-11-02 ENCOUNTER — Other Ambulatory Visit: Payer: Self-pay | Admitting: "Endocrinology

## 2018-11-04 DIAGNOSIS — E11319 Type 2 diabetes mellitus with unspecified diabetic retinopathy without macular edema: Secondary | ICD-10-CM | POA: Diagnosis not present

## 2018-11-10 ENCOUNTER — Other Ambulatory Visit: Payer: Self-pay | Admitting: "Endocrinology

## 2018-11-11 DIAGNOSIS — E119 Type 2 diabetes mellitus without complications: Secondary | ICD-10-CM | POA: Diagnosis not present

## 2018-11-11 DIAGNOSIS — Z6831 Body mass index (BMI) 31.0-31.9, adult: Secondary | ICD-10-CM | POA: Diagnosis not present

## 2018-11-11 DIAGNOSIS — I10 Essential (primary) hypertension: Secondary | ICD-10-CM | POA: Diagnosis not present

## 2018-11-11 DIAGNOSIS — Z789 Other specified health status: Secondary | ICD-10-CM | POA: Diagnosis not present

## 2018-11-11 DIAGNOSIS — M25561 Pain in right knee: Secondary | ICD-10-CM | POA: Diagnosis not present

## 2018-11-11 DIAGNOSIS — Z299 Encounter for prophylactic measures, unspecified: Secondary | ICD-10-CM | POA: Diagnosis not present

## 2018-11-11 DIAGNOSIS — M159 Polyosteoarthritis, unspecified: Secondary | ICD-10-CM | POA: Diagnosis not present

## 2018-11-15 ENCOUNTER — Ambulatory Visit
Admission: RE | Admit: 2018-11-15 | Discharge: 2018-11-15 | Disposition: A | Discharge status: Home / Self Care | Payer: Medicare Other | Source: Ambulatory Visit | Attending: Neurology | Admitting: Neurology

## 2018-11-15 DIAGNOSIS — M5416 Radiculopathy, lumbar region: Secondary | ICD-10-CM

## 2018-11-15 DIAGNOSIS — M4727 Other spondylosis with radiculopathy, lumbosacral region: Secondary | ICD-10-CM | POA: Diagnosis not present

## 2018-11-15 MED ORDER — METHYLPREDNISOLONE ACETATE 40 MG/ML INJ SUSP (RADIOLOG
120.0000 mg | Freq: Once | INTRAMUSCULAR | Status: AC
  Administered 2018-11-15: 120 mg via EPIDURAL

## 2018-11-15 MED ORDER — IOPAMIDOL (ISOVUE-M 200) INJECTION 41%
1.0000 mL | Freq: Once | INTRAMUSCULAR | Status: AC
  Administered 2018-11-15: 1 mL via EPIDURAL

## 2018-11-16 DIAGNOSIS — Z299 Encounter for prophylactic measures, unspecified: Secondary | ICD-10-CM | POA: Diagnosis not present

## 2018-11-16 DIAGNOSIS — J42 Unspecified chronic bronchitis: Secondary | ICD-10-CM | POA: Diagnosis not present

## 2018-11-16 DIAGNOSIS — I10 Essential (primary) hypertension: Secondary | ICD-10-CM | POA: Diagnosis not present

## 2018-11-16 DIAGNOSIS — M1711 Unilateral primary osteoarthritis, right knee: Secondary | ICD-10-CM | POA: Diagnosis not present

## 2018-11-16 DIAGNOSIS — Z6831 Body mass index (BMI) 31.0-31.9, adult: Secondary | ICD-10-CM | POA: Diagnosis not present

## 2018-11-16 DIAGNOSIS — M25561 Pain in right knee: Secondary | ICD-10-CM | POA: Diagnosis not present

## 2018-11-17 ENCOUNTER — Telehealth: Payer: Self-pay

## 2018-11-17 NOTE — Telephone Encounter (Signed)
[name] is calling stating that she is having high blood sugar readings due to having several prednisone injections and cortisone injections she is asking if she needs to increase insulin, please advise?

## 2018-11-17 NOTE — Telephone Encounter (Signed)
Let us get some readings x 2 days.

## 2018-11-19 NOTE — Telephone Encounter (Signed)
I called Mrs Midgley and left her a voicemail to call back to give [place] blood sugar readings

## 2018-11-23 DIAGNOSIS — S99922A Unspecified injury of left foot, initial encounter: Secondary | ICD-10-CM | POA: Diagnosis not present

## 2018-11-23 DIAGNOSIS — I10 Essential (primary) hypertension: Secondary | ICD-10-CM | POA: Diagnosis not present

## 2018-11-23 DIAGNOSIS — M7989 Other specified soft tissue disorders: Secondary | ICD-10-CM | POA: Diagnosis not present

## 2018-11-23 DIAGNOSIS — M79672 Pain in left foot: Secondary | ICD-10-CM | POA: Diagnosis not present

## 2018-11-23 DIAGNOSIS — Z6831 Body mass index (BMI) 31.0-31.9, adult: Secondary | ICD-10-CM | POA: Diagnosis not present

## 2018-11-23 DIAGNOSIS — Z299 Encounter for prophylactic measures, unspecified: Secondary | ICD-10-CM | POA: Diagnosis not present

## 2018-11-24 DIAGNOSIS — R531 Weakness: Secondary | ICD-10-CM | POA: Diagnosis not present

## 2018-11-24 DIAGNOSIS — R262 Difficulty in walking, not elsewhere classified: Secondary | ICD-10-CM | POA: Diagnosis not present

## 2018-11-24 DIAGNOSIS — M25561 Pain in right knee: Secondary | ICD-10-CM | POA: Diagnosis not present

## 2018-11-25 ENCOUNTER — Ambulatory Visit (INDEPENDENT_AMBULATORY_CARE_PROVIDER_SITE_OTHER): Payer: Medicare Other | Admitting: Podiatry

## 2018-11-25 ENCOUNTER — Ambulatory Visit: Payer: Medicare Other

## 2018-11-25 DIAGNOSIS — S9032XA Contusion of left foot, initial encounter: Secondary | ICD-10-CM | POA: Diagnosis not present

## 2018-11-25 DIAGNOSIS — R600 Localized edema: Secondary | ICD-10-CM

## 2018-11-25 DIAGNOSIS — M79672 Pain in left foot: Secondary | ICD-10-CM

## 2018-11-25 MED ORDER — HYDROCODONE-ACETAMINOPHEN 5-325 MG PO TABS: 1.0000 | ORAL_TABLET | Freq: Four times a day (QID) | ORAL | 0 refills | Status: DC | PRN
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
## 2018-11-29 DIAGNOSIS — R262 Difficulty in walking, not elsewhere classified: Secondary | ICD-10-CM | POA: Diagnosis not present

## 2018-11-29 DIAGNOSIS — M25561 Pain in right knee: Secondary | ICD-10-CM | POA: Diagnosis not present

## 2018-11-29 DIAGNOSIS — R531 Weakness: Secondary | ICD-10-CM | POA: Diagnosis not present

## 2018-11-30 ENCOUNTER — Ambulatory Visit (INDEPENDENT_AMBULATORY_CARE_PROVIDER_SITE_OTHER): Payer: Medicare Other | Admitting: Internal Medicine

## 2018-11-30 ENCOUNTER — Encounter (INDEPENDENT_AMBULATORY_CARE_PROVIDER_SITE_OTHER): Payer: Self-pay | Admitting: Internal Medicine

## 2018-11-30 VITALS — BP 126/73 | HR 74 | Temp 98.1°F | Resp 18 | Ht 62.0 in | Wt 178.6 lb

## 2018-11-30 DIAGNOSIS — K589 Irritable bowel syndrome without diarrhea: Secondary | ICD-10-CM

## 2018-11-30 DIAGNOSIS — K760 Fatty (change of) liver, not elsewhere classified: Secondary | ICD-10-CM | POA: Diagnosis not present

## 2018-11-30 DIAGNOSIS — K219 Gastro-esophageal reflux disease without esophagitis: Secondary | ICD-10-CM | POA: Diagnosis not present

## 2018-11-30 DIAGNOSIS — K7460 Unspecified cirrhosis of liver: Secondary | ICD-10-CM

## 2018-11-30 MED ORDER — DICYCLOMINE HCL 20 MG PO TABS: 20.0000 mg | ORAL_TABLET | Freq: Three times a day (TID) | ORAL | 5 refills | Status: DC

## 2018-11-30 NOTE — Patient Instructions (Signed)
Try decreasing dicyclomine dose by 10 mg every 2 weeks or so. Goal is to get dose down to 10 mg 3 times a day. Abdominal ultrasound to be scheduled.

## 2018-11-30 NOTE — Progress Notes (Signed)
Presenting complaint;  Follow-up for fatty liver disease IBS and GERD.  Database and subjective:  Patient is 58 year old Caucasian female history of fatty liver complicated by G6/K5 disease(May 24, 2015), chronic GERD and IBS who is here for scheduled visit.  She was last seen in May 2019. She states she has not been walking since she sustained injury to her left foot.  She was diagnosed with hypothyroidism about 8 weeks ago and now is on Synthroid.  She is under care of Dr. Dorris Fetch.  She has noted less heartburn since she was begun on thyroid medication.  She says she is doing better as far as nausea and vomiting is concerned.  Last episode was 1 month ago.  She still has some urgency.  Bowels move 2-3 times a day most of her stools are formed.  She denies abdominal pain melena or rectal bleeding.  She has not lost any weight since her last visit.   Current Medications: Outpatient Encounter Medications as of 11/30/2018  Medication Sig  . acetaZOLAMIDE (DIAMOX) 250 MG tablet Take 250 mg by mouth daily.   Marland Kitchen acyclovir (ZOVIRAX) 400 MG tablet Take 400 mg by mouth 3 (three) times daily as needed (for fever blisters).   Marland Kitchen atorvastatin (LIPITOR) 20 MG tablet TAKE ONE TABLET BY MOUTH DAILY.  . chlorhexidine (PERIDEX) 0.12 % solution as needed.   . Cholecalciferol (VITAMIN D3) 2000 units capsule Take 4,000 Units by mouth daily.   . cyanocobalamin (,VITAMIN B-12,) 1000 MCG/ML injection Inject 1,000 mcg into the muscle every 30 (thirty) days.  . diazepam (VALIUM) 2 MG tablet Take 2 mg by mouth every 12 (twelve) hours as needed for anxiety.   . dicyclomine (BENTYL) 20 MG tablet TAKE ONE TABLET BY MOUTH THREE TIMES DAILY  . DULoxetine (CYMBALTA) 60 MG capsule Take 60 mg by mouth daily.  Marland Kitchen estradiol (ESTRACE) 0.5 MG tablet Take 0.5 mg by mouth every evening.   . fluticasone (FLONASE) 50 MCG/ACT nasal spray Place 2 sprays into both nostrils every morning.   Marland Kitchen glucose blood (FREESTYLE LITE) test strip Use as  instructed  . glyBURIDE-metformin (GLUCOVANCE) 5-500 MG tablet Take 1 tablet by mouth daily with breakfast.   . HYDROcodone-acetaminophen (NORCO/VICODIN) 5-325 MG tablet Take 1 tablet by mouth every 6 (six) hours as needed for severe pain.  . Influenza Vac Subunit Quad (FLUCELVAX QUADRIVALENT) 0.5 ML SUSY Flucelvax Quad 2019-2020 (PF) 60 mcg (15 mcg x 4)/0.5 mL IM syringe  INJECT 0.5ML INTRAMUSCULARLY ONCE.  Marland Kitchen levothyroxine (SYNTHROID, LEVOTHROID) 75 MCG tablet TAKE ONE TABLET BY MOUTH DAILY BEFORE BREAKFAST  . meloxicam (MOBIC) 15 MG tablet meloxicam 15 mg tablet  . metFORMIN (GLUCOPHAGE) 500 MG tablet Take 1 tablet (500 mg total) by mouth 2 (two) times daily with a meal.  . metoprolol (LOPRESSOR) 50 MG tablet Take 25 mg by mouth daily.   . ondansetron (ZOFRAN) 4 MG tablet Take 1 tablet (4 mg total) by mouth every 8 (eight) hours as needed for nausea or vomiting.  . pantoprazole (PROTONIX) 40 MG tablet 40 mg daily.   Marland Kitchen PARoxetine (PAXIL) 40 MG tablet Take 40 mg by mouth daily.   Vladimir Faster Glycol-Propyl Glycol (SYSTANE OP) Place 1 drop into both eyes daily.  Marland Kitchen PRODIGY TWIST TOP LANCETS 28G MISC TEST TWICE DAILY  . promethazine (PHENERGAN) 25 MG tablet Take 25 mg by mouth every 6 (six) hours as needed for nausea. Only as needed  . tiZANidine (ZANAFLEX) 4 MG tablet Take 4 mg by mouth. Patient states  that she takes as needed  . TRESIBA FLEXTOUCH 200 UNIT/ML SOPN INJECT 66 UNITS INTO THE SKIN AT BEDTIME.  . Turmeric 500 MG CAPS Take 500 mg by mouth daily.   . [DISCONTINUED] albuterol (PROVENTIL) (2.5 MG/3ML) 0.083% nebulizer solution albuterol sulfate 2.5 mg/3 mL (0.083 %) solution for nebulization  INHALE CONTENTS OF 1 VIAL IN NEBULIZER BY MOUTH FOUR TIMES DAILY AS NEEDED.  . [DISCONTINUED] cyclobenzaprine (FLEXERIL) 10 MG tablet cyclobenzaprine 10 mg tablet  . [DISCONTINUED] dicyclomine (BENTYL) 10 MG capsule Take 20 mg by mouth 3 (three) times daily as needed for spasms.  . [DISCONTINUED]  fluconazole (DIFLUCAN) 150 MG tablet fluconazole 150 mg tablet  . [DISCONTINUED] HYDROcodone-homatropine (HYCODAN) 5-1.5 MG/5ML syrup hydrocodone-homatropine 5 mg-1.5 mg/5 mL oral syrup  TAKE ONE TEASPOONFUL (5ML) BY MOUTH THREE TIMES DAILY AS NEEDED.  . [DISCONTINUED] levofloxacin (LEVAQUIN) 500 MG tablet Take 500 mg by mouth daily.  . [DISCONTINUED] Multiple Vitamin (MULTIVITAMIN) tablet Take by mouth.  . [DISCONTINUED] Multiple Vitamins-Minerals (WOMENS MULTI PO) Take 1 tablet by mouth daily.   . [DISCONTINUED] NON FORMULARY [facility]  Scar Cream -  Verapamil 10%, Pentoxifylline 5% Apply 1-2 grams to affected area 3-4 times daily Qty. 120 gm 3 refills  . [DISCONTINUED] oxyCODONE-acetaminophen (PERCOCET/ROXICET) 5-325 MG tablet TAKE ONE TABLET BY MOUTH DAILY FOR ONE WEEK, ONE TABLET EVERY OTHER DAY FOR ONE WEEK THEN ONE-HALF TABLET EVERY OTHER DAY UNTIL DONE.  . [DISCONTINUED] predniSONE (DELTASONE) 5 MG tablet prednisone 5 mg tablets in a dose pack   No facility-administered encounter medications on file as of 11/30/2018.      Objective: Blood pressure 126/73, pulse 74, temperature 98.1 F (36.7 C), temperature source Oral, resp. rate 18, height 5' 2"  (1.575 m), weight 178 lb 9.6 oz (81 kg). Patient is alert and in no acute distress. Conjunctiva is pink. Sclera is nonicteric Oropharyngeal mucosa is dry otherwise normal. No neck masses or thyromegaly noted. Cardiac exam with regular rhythm normal S1 and S2. No murmur or gallop noted. Lungs are clear to auscultation. Abdomen is full but soft and nontender with organomegaly or masses. No LE edema or clubbing noted.  She has left foot ankle brace.  Labs/studies Results: Lab data from 09/17/2018 Bilirubin 0.6, AP 104, AST 27 and ALT 30.  Total protein 6.9 and albumin 3.7.  A1c was 9.7.  Last imaging was abdominal pelvic CT on 10/13/2017 revealing liver contour consistent with cirrhosis without focal defects and normal-sized  spleen.  Assessment:  #1.  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  Her ALT is borderline.  AST is normal.  She must try to lose some weight and work towards optimizing diabetic control to slow down or prevent progression of her liver disease.  #2.  Chronic GERD.  She is doing well with therapy.  #3.  Irritable bowel syndrome.  Since she is doing well with therapy she should consider dropping antispasmodic dose down.   Plan:  Alpha-fetoprotein with next blood draw. Abdominal ultrasound for [facility] screening. Decrease dicyclomine dose to 50 mg/day(20, 20 and 10) for a few weeks followed by 40 mg/day(20,10 and 10) and eventually dropping the dose to 10 mg before each meal. Office visit in 6 months.

## 2018-12-01 DIAGNOSIS — R262 Difficulty in walking, not elsewhere classified: Secondary | ICD-10-CM | POA: Diagnosis not present

## 2018-12-01 DIAGNOSIS — M25561 Pain in right knee: Secondary | ICD-10-CM | POA: Diagnosis not present

## 2018-12-01 DIAGNOSIS — R531 Weakness: Secondary | ICD-10-CM | POA: Diagnosis not present

## 2018-12-06 DIAGNOSIS — R531 Weakness: Secondary | ICD-10-CM | POA: Diagnosis not present

## 2018-12-06 DIAGNOSIS — M25561 Pain in right knee: Secondary | ICD-10-CM | POA: Diagnosis not present

## 2018-12-06 DIAGNOSIS — R262 Difficulty in walking, not elsewhere classified: Secondary | ICD-10-CM | POA: Diagnosis not present

## 2018-12-08 DIAGNOSIS — R262 Difficulty in walking, not elsewhere classified: Secondary | ICD-10-CM | POA: Diagnosis not present

## 2018-12-08 DIAGNOSIS — M25561 Pain in right knee: Secondary | ICD-10-CM | POA: Diagnosis not present

## 2018-12-08 DIAGNOSIS — R531 Weakness: Secondary | ICD-10-CM | POA: Diagnosis not present

## 2018-12-09 ENCOUNTER — Encounter: Payer: Self-pay | Admitting: Podiatry

## 2018-12-09 ENCOUNTER — Ambulatory Visit (INDEPENDENT_AMBULATORY_CARE_PROVIDER_SITE_OTHER): Payer: Medicare Other | Admitting: Podiatry

## 2018-12-09 ENCOUNTER — Ambulatory Visit (INDEPENDENT_AMBULATORY_CARE_PROVIDER_SITE_OTHER): Payer: Medicare Other

## 2018-12-09 ENCOUNTER — Ambulatory Visit (HOSPITAL_COMMUNITY): Payer: Medicare Other

## 2018-12-09 ENCOUNTER — Other Ambulatory Visit: Payer: Self-pay | Admitting: Podiatry

## 2018-12-09 DIAGNOSIS — S9032XA Contusion of left foot, initial encounter: Secondary | ICD-10-CM | POA: Diagnosis not present

## 2018-12-09 DIAGNOSIS — M79672 Pain in left foot: Secondary | ICD-10-CM

## 2018-12-09 DIAGNOSIS — S9032XD Contusion of left foot, subsequent encounter: Secondary | ICD-10-CM | POA: Diagnosis not present

## 2018-12-09 NOTE — Progress Notes (Signed)
Subjective:  Patient ID: [name], female    DOB: 1961/10/04,  MRN: [id number]  Chief Complaint  Patient presents with  . Foot Injury    L-follow up; "doing alright with little tenderness; not much pain today; no other concerns"    58 y.o. female presents with the above complaint. History above confirmed with patient.  Review of Systems: Negative except as noted in the HPI. Denies N/V/F/Ch.  Past Medical History:  Diagnosis Date  . Anxiety disorder   . Chronic low back pain   . Cirrhosis of liver ([place])   . Colitis   . Diabetes mellitus    Type II  . Diabetic neuropathy ([place])   . Diverticulitis   . Fatty liver   . GERD (gastroesophageal reflux disease)   . High cholesterol   . Hypertension   . Irritable bowel syndrome   . Meniere's disease   . Neuropathy   . Pain     Current Outpatient Medications:  .  acetaZOLAMIDE (DIAMOX) 250 MG tablet, Take 250 mg by mouth daily. , Disp: , Rfl:  .  acyclovir (ZOVIRAX) 400 MG tablet, Take 400 mg by mouth 3 (three) times daily as needed (for fever blisters). , Disp: , Rfl:  .  atorvastatin (LIPITOR) 20 MG tablet, TAKE ONE TABLET BY MOUTH DAILY., Disp: 90 tablet, Rfl: 0 .  chlorhexidine (PERIDEX) 0.12 % solution, as needed. , Disp: , Rfl:  .  Cholecalciferol (VITAMIN D3) 2000 units capsule, Take 4,000 Units by mouth daily. , Disp: , Rfl:  .  cyanocobalamin (,VITAMIN B-12,) 1000 MCG/ML injection, Inject 1,000 mcg into the muscle every 30 (thirty) days., Disp: , Rfl: 5 .  diazepam (VALIUM) 2 MG tablet, Take 2 mg by mouth every 12 (twelve) hours as needed for anxiety. , Disp: , Rfl:  .  dicyclomine (BENTYL) 20 MG tablet, Take 1 tablet (20 mg total) by mouth 3 (three) times daily., Disp: 90 tablet, Rfl: 5 .  DULoxetine (CYMBALTA) 60 MG capsule, Take 60 mg by mouth daily., Disp: , Rfl:  .  estradiol (ESTRACE) 0.5 MG tablet, Take 0.5 mg by mouth every evening. , Disp: , Rfl:  .  fluticasone (FLONASE) 50 MCG/ACT nasal spray, Place 2 sprays  into both nostrils every morning. , Disp: , Rfl:  .  glucose blood (FREESTYLE LITE) test strip, Use as instructed, Disp: 100 each, Rfl: 2 .  glyBURIDE-metformin (GLUCOVANCE) 5-500 MG tablet, Take 1 tablet by mouth daily with breakfast. , Disp: , Rfl:  .  HYDROcodone-acetaminophen (NORCO/VICODIN) 5-325 MG tablet, Take 1 tablet by mouth every 6 (six) hours as needed for severe pain., Disp: 12 tablet, Rfl: 0 .  Influenza Vac Subunit Quad (FLUCELVAX QUADRIVALENT) 0.5 ML SUSY, Flucelvax Quad 2019-2020 (PF) 60 mcg (15 mcg x 4)/0.5 mL IM syringe  INJECT 0.5ML INTRAMUSCULARLY ONCE., Disp: , Rfl:  .  levothyroxine (SYNTHROID, LEVOTHROID) 75 MCG tablet, TAKE ONE TABLET BY MOUTH DAILY BEFORE BREAKFAST, Disp: 30 tablet, Rfl: 3 .  metFORMIN (GLUCOPHAGE) 500 MG tablet, Take 1 tablet (500 mg total) by mouth 2 (two) times daily with a meal., Disp: 60 tablet, Rfl: 3 .  metoprolol (LOPRESSOR) 50 MG tablet, Take 25 mg by mouth daily. , Disp: , Rfl:  .  ondansetron (ZOFRAN) 4 MG tablet, Take 1 tablet (4 mg total) by mouth every 8 (eight) hours as needed for nausea or vomiting., Disp: 12 tablet, Rfl: 0 .  pantoprazole (PROTONIX) 40 MG tablet, 40 mg daily. , Disp: , Rfl: 4 .  PARoxetine (PAXIL) 40 MG tablet, Take 20 mg by mouth daily. , Disp: , Rfl:  .  Polyethyl Glycol-Propyl Glycol (SYSTANE OP), Place 1 drop into both eyes daily., Disp: , Rfl:  .  PRODIGY TWIST TOP LANCETS 28G MISC, TEST TWICE DAILY, Disp: 200 each, Rfl: 2 .  promethazine (PHENERGAN) 25 MG tablet, Take 25 mg by mouth every 6 (six) hours as needed for nausea. Only as needed, Disp: , Rfl:  .  tiZANidine (ZANAFLEX) 4 MG tablet, Take 4 mg by mouth. Patient states that she takes as needed, Disp: , Rfl:  .  TRESIBA FLEXTOUCH 200 UNIT/ML SOPN, INJECT 66 UNITS INTO THE SKIN AT BEDTIME., Disp: 9 mL, Rfl: 2 .  Turmeric 500 MG CAPS, Take 500 mg by mouth daily. , Disp: , Rfl:   Social History   Tobacco Use  Smoking Status Never Smoker  Smokeless Tobacco Never  Used    Allergies  Allergen Reactions  . Flagyl [Metronidazole Hcl] Itching and Rash  . Nsaids Nausea Only   Objective:  There were no vitals filed for this visit. There is no height or weight on file to calculate BMI. Constitutional Well developed. Well nourished.  Vascular Dorsalis pedis pulses palpable bilaterally. Posterior tibial pulses palpable bilaterally. Capillary refill normal to all digits.  No cyanosis or clubbing noted. Pedal hair growth normal.  Neurologic Normal speech. Oriented to person, place, and time. Epicritic sensation to light touch grossly present bilaterally.  Dermatologic Nails well groomed and normal in appearance. No open wounds. No skin lesions.  Orthopedic: POP Left 1st MPJ, 2nd metatarsal neck.   Radiographs: Taken and reviewed no occult fracture. Assessment:   1. Contusion of left foot, subsequent encounter   2. Pain in left foot    Plan:  Patient was evaluated and treated and all questions answered.  Left foot injury -Repeat X-rays without occult fracture -Weight-bear as tolerated in CAM boot -Transition out of CAM in 2 weeks if tolerated  Return in about 1 month (around 01/09/2019).

## 2018-12-13 DIAGNOSIS — M25561 Pain in right knee: Secondary | ICD-10-CM | POA: Diagnosis not present

## 2018-12-13 DIAGNOSIS — R262 Difficulty in walking, not elsewhere classified: Secondary | ICD-10-CM | POA: Diagnosis not present

## 2018-12-13 DIAGNOSIS — R531 Weakness: Secondary | ICD-10-CM | POA: Diagnosis not present

## 2018-12-17 DIAGNOSIS — Z794 Long term (current) use of insulin: Secondary | ICD-10-CM | POA: Diagnosis not present

## 2018-12-17 DIAGNOSIS — R262 Difficulty in walking, not elsewhere classified: Secondary | ICD-10-CM | POA: Diagnosis not present

## 2018-12-17 DIAGNOSIS — E118 Type 2 diabetes mellitus with unspecified complications: Secondary | ICD-10-CM | POA: Diagnosis not present

## 2018-12-17 DIAGNOSIS — E559 Vitamin D deficiency, unspecified: Secondary | ICD-10-CM | POA: Diagnosis not present

## 2018-12-17 DIAGNOSIS — K7460 Unspecified cirrhosis of liver: Secondary | ICD-10-CM | POA: Diagnosis not present

## 2018-12-17 DIAGNOSIS — E1165 Type 2 diabetes mellitus with hyperglycemia: Secondary | ICD-10-CM | POA: Diagnosis not present

## 2018-12-17 DIAGNOSIS — M25561 Pain in right knee: Secondary | ICD-10-CM | POA: Diagnosis not present

## 2018-12-17 DIAGNOSIS — R531 Weakness: Secondary | ICD-10-CM | POA: Diagnosis not present

## 2018-12-18 LAB — HEMOGLOBIN A1C
Hgb A1c MFr Bld: 10.5 % of total Hgb — ABNORMAL HIGH (ref <5.7)
Mean Plasma Glucose: 255 (calc)
eAG (mmol/L): 14.1 (calc)

## 2018-12-18 LAB — COMPLETE METABOLIC PANEL WITH GFR
AG Ratio: 1.2 (calc) (ref 1.0–2.5)
ALT: 26 U/L (ref 6–29)
AST: 29 U/L (ref 10–35)
Albumin: 3.6 g/dL (ref 3.6–5.1)
Alkaline phosphatase (APISO): 110 U/L (ref 37–153)
BUN: 13 mg/dL (ref 7–25)
CALCIUM: 8.9 mg/dL (ref 8.6–10.4)
CO2: 25 mmol/L (ref 20–32)
Chloride: 106 mmol/L (ref 98–110)
Creat: 0.71 mg/dL (ref 0.50–1.05)
GFR, Est African American: 110 mL/min/{1.73_m2} (ref >=60)
GFR, Est Non African American: 95 mL/min/{1.73_m2} (ref >=60)
GLOBULIN: 3.1 g/dL (ref 1.9–3.7)
Glucose, Bld: 159 mg/dL — ABNORMAL HIGH (ref 65–99)
POTASSIUM: 4.2 mmol/L (ref 3.5–5.3)
SODIUM: 140 mmol/L (ref 135–146)
Total Bilirubin: 0.2 mg/dL (ref 0.2–1.2)
Total Protein: 6.7 g/dL (ref 6.1–8.1)

## 2018-12-18 LAB — VITAMIN D 25 HYDROXY (VIT D DEFICIENCY, FRACTURES): Vit D, 25-Hydroxy: 43 ng/mL (ref 30–100)

## 2018-12-20 ENCOUNTER — Other Ambulatory Visit: Payer: Self-pay | Admitting: "Endocrinology

## 2018-12-20 DIAGNOSIS — R531 Weakness: Secondary | ICD-10-CM | POA: Diagnosis not present

## 2018-12-20 DIAGNOSIS — R262 Difficulty in walking, not elsewhere classified: Secondary | ICD-10-CM | POA: Diagnosis not present

## 2018-12-20 DIAGNOSIS — M25561 Pain in right knee: Secondary | ICD-10-CM | POA: Diagnosis not present

## 2018-12-20 LAB — AFP TUMOR MARKER: AFP-Tumor Marker: 3.6 ng/mL

## 2018-12-23 DIAGNOSIS — M25561 Pain in right knee: Secondary | ICD-10-CM | POA: Diagnosis not present

## 2018-12-23 DIAGNOSIS — R531 Weakness: Secondary | ICD-10-CM | POA: Diagnosis not present

## 2018-12-23 DIAGNOSIS — R262 Difficulty in walking, not elsewhere classified: Secondary | ICD-10-CM | POA: Diagnosis not present

## 2018-12-24 ENCOUNTER — Telehealth (INDEPENDENT_AMBULATORY_CARE_PROVIDER_SITE_OTHER): Payer: Self-pay | Admitting: *Deleted

## 2018-12-24 ENCOUNTER — Other Ambulatory Visit (INDEPENDENT_AMBULATORY_CARE_PROVIDER_SITE_OTHER): Payer: Self-pay | Admitting: *Deleted

## 2018-12-24 DIAGNOSIS — K7460 Unspecified cirrhosis of liver: Secondary | ICD-10-CM

## 2018-12-24 NOTE — Telephone Encounter (Signed)
Patient needs AFP in 6 mths

## 2018-12-24 NOTE — Telephone Encounter (Signed)
Lab is ordered . Patient will be sent a letter as a reminder.

## 2018-12-27 ENCOUNTER — Other Ambulatory Visit: Payer: Self-pay

## 2018-12-27 ENCOUNTER — Telehealth: Payer: Medicare Other | Admitting: "Endocrinology

## 2018-12-27 ENCOUNTER — Telehealth (INDEPENDENT_AMBULATORY_CARE_PROVIDER_SITE_OTHER): Payer: Medicare Other | Admitting: "Endocrinology

## 2018-12-27 DIAGNOSIS — K7460 Unspecified cirrhosis of liver: Secondary | ICD-10-CM | POA: Diagnosis not present

## 2018-12-27 DIAGNOSIS — Z794 Long term (current) use of insulin: Secondary | ICD-10-CM

## 2018-12-27 DIAGNOSIS — E118 Type 2 diabetes mellitus with unspecified complications: Secondary | ICD-10-CM

## 2018-12-27 DIAGNOSIS — E1165 Type 2 diabetes mellitus with hyperglycemia: Secondary | ICD-10-CM

## 2018-12-27 DIAGNOSIS — E782 Mixed hyperlipidemia: Secondary | ICD-10-CM | POA: Diagnosis not present

## 2018-12-27 DIAGNOSIS — IMO0002 Reserved for concepts with insufficient information to code with codable children: Secondary | ICD-10-CM

## 2018-12-27 DIAGNOSIS — E669 Obesity, unspecified: Secondary | ICD-10-CM | POA: Diagnosis not present

## 2018-12-27 DIAGNOSIS — E039 Hypothyroidism, unspecified: Secondary | ICD-10-CM

## 2018-12-27 MED ORDER — TRESIBA FLEXTOUCH 200 UNIT/ML ~~LOC~~ SOPN: 74.0000 [IU] | PEN_INJECTOR | Freq: Every day | SUBCUTANEOUS | 2 refills | Status: DC

## 2018-12-27 NOTE — Patient Instructions (Addendum)
Endocrinology Telephone Visit Follow up Note -During COVID -19 Pandemic    Subjective:    Patient ID: [name], female    DOB: 1961/07/17.  This is a telephone visit for management of diabetes,  hyperlipidemia, hypothyroidism during the coronavirus pandemic.    PMD: Glenda Chroman, MD  Past Medical History:  Diagnosis Date  . Anxiety disorder   . Chronic low back pain   . Cirrhosis of liver ([place])   . Colitis   . Diabetes mellitus    Type II  . Diabetic neuropathy ([place])   . Diverticulitis   . Fatty liver   . GERD (gastroesophageal reflux disease)   . High cholesterol   . Hypertension   . Irritable bowel syndrome   . Meniere's disease   . Neuropathy   . Pain    Past Surgical History:  Procedure Laterality Date  . ABDOMINAL HYSTERECTOMY    . BIOPSY  03/20/2017   Procedure: BIOPSY;  Surgeon: [name], MD;  [facility];  Service: Endoscopy;;  colon gastric  . bladder tact  2005  . CHOLECYSTECTOMY  08/11  . COLONOSCOPY  2208  . COLONOSCOPY  In of September 2014   @ [facility]/Dr,.[name]  . COLONOSCOPY WITH PROPOFOL N/A 03/20/2017   Procedure: COLONOSCOPY WITH PROPOFOL;  Surgeon: [name], MD;  [facility];  Service: Endoscopy;  Laterality: N/A;  . ECTOPIC PREGNANCY SURGERY  1996  . ESOPHAGOGASTRODUODENOSCOPY (EGD) WITH PROPOFOL N/A 03/20/2017   Procedure: ESOPHAGOGASTRODUODENOSCOPY (EGD) WITH PROPOFOL;  Surgeon: [name], MD;  [facility];  Service: Endoscopy;  Laterality: N/A;  12:45  . HERNIA REPAIR  09/17/11  . LIGAMENT REPAIR Left 02/10/2018   Procedure: Left  ANKLE ATFL Repair and Ligament Augmentation, Injection of Tendon Splint Application;  Surgeon: [name], DPM;  Location: [place];  Service: Podiatry;  Laterality: Left;  . [place]  2009   Preformed on the right shoulder  . OVARIAN CYST REMOVAL     right side  . PARTIAL HYSTERECTOMY  2005  .  POLYPECTOMY  03/20/2017   Procedure: POLYPECTOMY;  Surgeon: [name], MD;  [facility];  Service: Endoscopy;;  colon  . TENDON REPAIR Left 02/10/2018   Procedure: PERONEAL TENDON REPAIR and Synovectomy Peroneal Tendon;  Surgeon: [name], DPM;  Location: [facility];  Service: Podiatry;  Laterality: Left;  . UPPER GASTROINTESTINAL ENDOSCOPY  2008  . URETERAL STENT PLACEMENT     Social History   Socioeconomic History  . Marital status: Divorced    Spouse name: Not on file  . Number of children: Not on file  . Years of education: Not on file  . Highest education level: Not on file  Occupational History  . Not on file  Social Needs  . Financial resource strain: Not on file  . Food insecurity:    Worry: Not on file    Inability: Not on file  . Transportation needs:    Medical: Not on file    Non-medical: Not on file  Tobacco Use  . Smoking status: Never Smoker  . Smokeless tobacco: Never Used  Substance and Sexual Activity  .  Alcohol use: No  . Drug use: No  . Sexual activity: Yes    Birth control/protection: Surgical  Lifestyle  . Physical activity:    Days per week: Not on file    Minutes per session: Not on file  . Stress: Not on file  Relationships  . Social connections:    Talks on phone: Not on file    Gets together: Not on file    Attends religious service: Not on file    Active member of club or organization: Not on file    Attends meetings of clubs or organizations: Not on file    Relationship status: Not on file  Other Topics Concern  . Not on file  Social History Narrative  . Not on file   Outpatient Encounter Medications as of 12/27/2018  Medication Sig  . acetaZOLAMIDE (DIAMOX) 250 MG tablet Take 250 mg by mouth daily.   Marland Kitchen acyclovir (ZOVIRAX) 400 MG tablet Take 400 mg by mouth 3 (three) times daily as needed (for fever blisters).   Marland Kitchen atorvastatin (LIPITOR) 20 MG tablet TAKE ONE TABLET BY MOUTH DAILY.  . chlorhexidine (PERIDEX) 0.12 %  solution as needed.   . Cholecalciferol (VITAMIN D3) 2000 units capsule Take 4,000 Units by mouth daily.   . cyanocobalamin (,VITAMIN B-12,) 1000 MCG/ML injection Inject 1,000 mcg into the muscle every 30 (thirty) days.  . diazepam (VALIUM) 2 MG tablet Take 2 mg by mouth every 12 (twelve) hours as needed for anxiety.   . dicyclomine (BENTYL) 20 MG tablet Take 1 tablet (20 mg total) by mouth 3 (three) times daily.  . DULoxetine (CYMBALTA) 60 MG capsule Take 60 mg by mouth daily.  Marland Kitchen estradiol (ESTRACE) 0.5 MG tablet Take 0.5 mg by mouth every evening.   . fluticasone (FLONASE) 50 MCG/ACT nasal spray Place 2 sprays into both nostrils every morning.   Marland Kitchen glucose blood (FREESTYLE LITE) test strip Use as instructed  . HYDROcodone-acetaminophen (NORCO/VICODIN) 5-325 MG tablet Take 1 tablet by mouth every 6 (six) hours as needed for severe pain.  . Influenza Vac Subunit Quad (FLUCELVAX QUADRIVALENT) 0.5 ML SUSY Flucelvax Quad 2019-2020 (PF) 60 mcg (15 mcg x 4)/0.5 mL IM syringe  INJECT 0.5ML INTRAMUSCULARLY ONCE.  Marland Kitchen levothyroxine (SYNTHROID, LEVOTHROID) 75 MCG tablet TAKE ONE TABLET BY MOUTH DAILY BEFORE BREAKFAST  . metoprolol (LOPRESSOR) 50 MG tablet Take 25 mg by mouth daily.   . ondansetron (ZOFRAN) 4 MG tablet Take 1 tablet (4 mg total) by mouth every 8 (eight) hours as needed for nausea or vomiting.  . pantoprazole (PROTONIX) 40 MG tablet 40 mg daily.   Marland Kitchen PARoxetine (PAXIL) 40 MG tablet Take 20 mg by mouth daily.   Vladimir Faster Glycol-Propyl Glycol (SYSTANE OP) Place 1 drop into both eyes daily.  Marland Kitchen PRODIGY TWIST TOP LANCETS 28G MISC TEST TWICE DAILY  . promethazine (PHENERGAN) 25 MG tablet Take 25 mg by mouth every 6 (six) hours as needed for nausea. Only as needed  . tiZANidine (ZANAFLEX) 4 MG tablet Take 4 mg by mouth. Patient states that she takes as needed  . TRESIBA FLEXTOUCH 200 UNIT/ML SOPN Inject 74 Units into the skin at bedtime.  . Turmeric 500 MG CAPS Take 500 mg by mouth daily.   .  [DISCONTINUED] glyBURIDE-metformin (GLUCOVANCE) 5-500 MG tablet Take 1 tablet by mouth daily with breakfast.   . [DISCONTINUED] metFORMIN (GLUCOPHAGE) 500 MG tablet Take 1 tablet (500 mg total) by mouth 2 (two) times daily with a meal.  . [DISCONTINUED]  TRESIBA FLEXTOUCH 200 UNIT/ML SOPN INJECT 66 UNITS INTO THE SKIN AT BEDTIME. (Patient taking differently: Inject 70 Units into the skin at bedtime. )   No facility-administered encounter medications on file as of 12/27/2018.    ALLERGIES: Allergies  Allergen Reactions  . Flagyl [Metronidazole Hcl] Itching and Rash  . Nsaids Nausea Only   VACCINATION STATUS: Immunization History  Administered Date(s) Administered  . Influenza Inj Mdck Quad Pf 08/07/2017, 08/11/2018    Diabetes  She presents for her follow-up diabetic visit. She has type 2 diabetes mellitus. Onset time: She was diagnosed approximately at age 87 years. Her disease course has been worsening.  She reports morning glycemic range between 92 and 310, however mostly above 200 mg per DL.  Her postprandial blood glucose readings are between 162 200 mg per DL.  Her previsit A1c is 10.5%, increasing from 9.7%.  She does not report any major hypoglycemic episodes.  She is currently on Tresiba 65 units nightly, metformin 500 mg p.o. twice daily.  Since her last visit she was diagnosed with cirrhosis of the liver.    She is following a generally unhealthy diet. When asked about meal planning, she reported none. She has not had a previous visit with a dietitian. She never participates in exercise.   For her hypothyroidism, she is on levothyroxine 75 mcg p.o. nightly.  She reports compliance.  She has no new complaints.   Hyperlipidemia  This is a chronic problem. The current episode started more than 1 year ago. The problem is controlled. Exacerbating diseases include diabetes and obesity. Pertinent negatives include no chest pain, myalgias or shortness of breath. Current antihyperlipidemic  treatment includes statins. Risk factors for coronary artery disease include diabetes mellitus, dyslipidemia, obesity, a sedentary lifestyle and post-menopausal.     Objective:      CMP     Component Value Date/Time   NA 140 12/17/2018 0816   K 4.2 12/17/2018 0816   CL 106 12/17/2018 0816   CO2 25 12/17/2018 0816   GLUCOSE 159 (H) 12/17/2018 0816   BUN 13 12/17/2018 0816   BUN 17 06/21/2018   CREATININE 0.71 12/17/2018 0816   CALCIUM 8.9 12/17/2018 0816   PROT 6.7 12/17/2018 0816   ALBUMIN 3.4 (L) 10/13/2017 1013   AST 29 12/17/2018 0816   ALT 26 12/17/2018 0816   ALKPHOS 86 10/13/2017 1013   BILITOT 0.2 12/17/2018 0816   GFRNONAA 95 12/17/2018 0816   GFRAA 110 12/17/2018 0816    Diabetic Labs (most recent): Lab Results  Component Value Date   HGBA1C 10.5 (H) 12/17/2018   HGBA1C 9.7 (H) 09/17/2018   HGBA1C 8.8 (A) 06/21/2018   Lipid Panel     Component Value Date/Time   CHOL 146 10/13/2017 0816   TRIG 90 10/13/2017 0816   HDL 49 (L) 10/13/2017 0816   CHOLHDL 3.0 10/13/2017 0816   VLDL 29 06/30/2016 1251   LDLCALC 80 10/13/2017 0816   Recent Results (from the past 2160 hour(s))  AFP tumor marker     Status: None   Collection Time: 12/17/18  8:12 AM  Result Value Ref Range   AFP-Tumor Marker 3.6 ng/mL    Comment: Reference Range:   <6.1 The use of AFP as a tumor marker in pregnant females is not recommended. . . This test was performed using the Beckman Coulter chemiluminescent method. Values obtained from different assay methods cannot be used interchangeably. AFP levels, regardless of value, should not be interpreted as absolute evidence of the  presence or absence of disease. Marland Kitchen   Hemoglobin A1c     Status: Abnormal   Collection Time: 12/17/18  8:16 AM  Result Value Ref Range   Hgb A1c MFr Bld 10.5 (H) <5.7 % of total Hgb    Comment: For someone without known diabetes, a hemoglobin A1c value of 6.5% or greater indicates that they may have   diabetes and this should be confirmed with a follow-up  test. . For someone with known diabetes, a value <7% indicates  that their diabetes is well controlled and a value  greater than or equal to 7% indicates suboptimal  control. A1c targets should be individualized based on  duration of diabetes, age, comorbid conditions, and  other considerations. . Currently, no consensus exists regarding use of hemoglobin A1c for diagnosis of diabetes for children. .    Mean Plasma Glucose 255 (calc)   eAG (mmol/L) 14.1 (calc)  COMPLETE METABOLIC PANEL WITH GFR     Status: Abnormal   Collection Time: 12/17/18  8:16 AM  Result Value Ref Range   Glucose, Bld 159 (H) 65 - 99 mg/dL    Comment: .            Fasting reference interval . For someone without known diabetes, a glucose value >125 mg/dL indicates that they may have diabetes and this should be confirmed with a follow-up test. .    BUN 13 7 - 25 mg/dL   Creat 0.71 0.50 - 1.05 mg/dL    Comment: For patients >41 years of age, the reference limit for Creatinine is approximately 13% higher for people identified as African-American. .    GFR, Est Non African American 95 > OR = 60 mL/min/1.74m   GFR, Est African American 110 > OR = 60 mL/min/1.782m  BUN/Creatinine Ratio NOT APPLICABLE 6 - 22 (calc)   Sodium 140 135 - 146 mmol/L   Potassium 4.2 3.5 - 5.3 mmol/L   Chloride 106 98 - 110 mmol/L   CO2 25 20 - 32 mmol/L   Calcium 8.9 8.6 - 10.4 mg/dL   Total Protein 6.7 6.1 - 8.1 g/dL   Albumin 3.6 3.6 - 5.1 g/dL   Globulin 3.1 1.9 - 3.7 g/dL (calc)   AG Ratio 1.2 1.0 - 2.5 (calc)   Total Bilirubin 0.2 0.2 - 1.2 mg/dL   Alkaline phosphatase (APISO) 110 37 - 153 U/L   AST 29 10 - 35 U/L   ALT 26 6 - 29 U/L  VITAMIN D 25 Hydroxy (Vit-D Deficiency, Fractures)     Status: None   Collection Time: 12/17/18  8:16 AM  Result Value Ref Range   Vit D, 25-Hydroxy 43 30 - 100 ng/mL    Comment: Vitamin D Status         25-OH Vitamin  D: . Deficiency:                    <20 ng/mL Insufficiency:             20 - 29 ng/mL Optimal:                 > or = 30 ng/mL . For 25-OH Vitamin D testing on patients on  D2-supplementation and patients for whom quantitation  of D2 and D3 fractions is required, the QuestAssureD(TM) 25-OH VIT D, (D2,D3), LC/MS/MS is recommended: order  code [id number] >2y71yr . For more information on this test, go to: http://education.questdiagnostics.com/faq/FAQ163 (This link is being provided for  informational/educational purposes only.)      Assessment & Plan:   1. Uncontrolled type 2 diabetes mellitus with complication, with long-term current use of insulin -This is a telephone visit due to the coronavirus pandemic. - Patient has currently uncontrolled symptomatic type 2 DM since  58 years of age. -Her previsit labs show A1c of 10.5% increasing from 9.7%.  This is mainly due to her interval exposure to yet another round of steroids related to her back pain.     Recent labs reviewed.   Her diabetes is complicated by obesity, insulin resistance, and patient remains at a high risk for more acute and chronic complications of diabetes which include CAD, CVA, CKD, retinopathy, and neuropathy. These are all discussed in detail with the patient.  - I have counseled the patient on diet management and weight loss, by adopting a carbohydrate restricted/protein rich diet.   -  Suggestion is made for her to avoid simple carbohydrates  from her diet including Cakes, Sweet Desserts / Pastries, Ice Cream, Soda (diet and regular), Sweet Tea, Candies, Chips, Cookies, Store Bought Juices, Alcohol in Excess of  1-2 drinks a day, Artificial Sweeteners, and "Sugar-free" Products. This will help patient to have stable blood glucose profile and potentially avoid unintended weight gain.    - I encouraged the patient to switch to  unprocessed or minimally processed complex starch and increased protein intake  (animal or plant source), fruits, and vegetables.  - Patient is advised to stick to a routine mealtimes to eat 3 meals  a day and avoid unnecessary snacks ( to snack only to correct hypoglycemia).  - She declines referral to CDE for now.  - I have approached patient with the following individualized plan to manage diabetes and patient agrees:   -Based on her reports of glycemic profile, she will tolerate a higher dose of basal insulin.  She is advised to increase Tresiba to 74 units nightly associated with monitoring of blood glucose at least 2 times a day-daily before breakfast and at bedtime.   - Continue to hold prandial insulin for now.    -Patient is encouraged to call clinic for blood glucose levels less than 70 or above 200 mg /dl. -Her renal function is back to normal.  She is advised to continue metformin 500 mg p.o. twice daily after breakfast and supper,  therapeutically suitable for patient.  -  She has history of intolerance for [place], likely would not tolerate other  incretin therapy.  - Patient specific target  A1c;  LDL, HDL, Triglycerides, and  Waist Circumference were discussed in detail.  2) Lipids/HPL: Her recent lab showed controlled LDL of 80, generally improving from 141.   I  advised her to continue atorvastatin 20 mg p.o. nightly.   Side effects and precautions discussed with her.  3) hypothyroidism:  -She is advised to continue levothyroxine 75 mcg p.o. every morning.  - We discussed about the correct intake of her thyroid hormone, on empty stomach at fasting, with water, separated by at least 30 minutes from breakfast and other medications,  and separated by more than 4 hours from calcium, iron, multivitamins, acid reflux medications (PPIs). -Patient is made aware of the fact that thyroid hormone replacement is needed for life, dose to be adjusted by periodic monitoring of thyroid function tests.   - I advised patient to maintain close follow up with Glenda Chroman, MD for primary care needs.  - Time spent with the patient: 25 min, of  which >50% was spent in reviewing her blood glucose logs , discussing her hypoglycemia and hyperglycemia episodes, reviewing her current and  previous labs / studies and medications  doses and developing a plan to avoid hypoglycemia and hyperglycemia.   [name] participated in the discussions, expressed understanding, and voiced agreement with the above plans.  All questions were answered to her satisfaction. she is encouraged to contact clinic should she have any questions or concerns prior to her return visit.   Follow up plan: She is advised to return in 3 months with repeat labs and meter/logs.  Glade Lloyd, MD Phone: [phone]  Fax: [phone]  This note was partially dictated with voice recognition software. Similar sounding words can be transcribed inadequately or may not  be corrected upon review.  12/27/2018, 1:59 PM

## 2018-12-27 NOTE — Progress Notes (Signed)
Endocrinology Telephone Visit Follow up Note -During COVID -19 Pandemic    Subjective:    Patient ID: [name], female    DOB: Jul 13, 1961.  This is a telephone visit for management of diabetes,  hyperlipidemia, hypothyroidism during the coronavirus pandemic.    PMD: Glenda Chroman, MD  Past Medical History:  Diagnosis Date  . Anxiety disorder   . Chronic low back pain   . Cirrhosis of liver ([place])   . Colitis   . Diabetes mellitus    Type II  . Diabetic neuropathy ([place])   . Diverticulitis   . Fatty liver   . GERD (gastroesophageal reflux disease)   . High cholesterol   . Hypertension   . Irritable bowel syndrome   . Meniere's disease   . Neuropathy   . Pain    Past Surgical History:  Procedure Laterality Date  . ABDOMINAL HYSTERECTOMY    . BIOPSY  03/20/2017   Procedure: BIOPSY;  Surgeon: [name], MD;  [facility];  Service: Endoscopy;;  colon gastric  . bladder tact  2005  . CHOLECYSTECTOMY  08/11  . COLONOSCOPY  2208  . COLONOSCOPY  In of September 2014   @ [facility]/Dr,.[name]  . COLONOSCOPY WITH PROPOFOL N/A 03/20/2017   Procedure: COLONOSCOPY WITH PROPOFOL;  Surgeon: [name], MD;  [facility];  Service: Endoscopy;  Laterality: N/A;  . ECTOPIC PREGNANCY SURGERY  1996  . ESOPHAGOGASTRODUODENOSCOPY (EGD) WITH PROPOFOL N/A 03/20/2017   Procedure: ESOPHAGOGASTRODUODENOSCOPY (EGD) WITH PROPOFOL;  Surgeon: [name], MD;  [facility];  Service: Endoscopy;  Laterality: N/A;  12:45  . HERNIA REPAIR  09/17/11  . LIGAMENT REPAIR Left 02/10/2018   Procedure: Left  ANKLE ATFL Repair and Ligament Augmentation, Injection of Tendon Splint Application;  Surgeon: [name], DPM;  Location: [facility];  Service: Podiatry;  Laterality: Left;  . [place]  2009   Preformed on the right shoulder  . OVARIAN CYST REMOVAL     right side  . PARTIAL HYSTERECTOMY  2005  .  POLYPECTOMY  03/20/2017   Procedure: POLYPECTOMY;  Surgeon: [name], MD;  [facility];  Service: Endoscopy;;  colon  . TENDON REPAIR Left 02/10/2018   Procedure: PERONEAL TENDON REPAIR and Synovectomy Peroneal Tendon;  Surgeon: [name], DPM;  Location: [facility];  Service: Podiatry;  Laterality: Left;  . UPPER GASTROINTESTINAL ENDOSCOPY  2008  . URETERAL STENT PLACEMENT     Social History   Socioeconomic History  . Marital status: Divorced    Spouse name: Not on file  . Number of children: Not on file  . Years of education: Not on file  . Highest education level: Not on file  Occupational History  . Not on file  Social Needs  . Financial resource strain: Not on file  . Food insecurity:    Worry: Not on file    Inability: Not on file  . Transportation needs:    Medical: Not on file    Non-medical: Not on file  Tobacco Use  . Smoking status: Never Smoker  . Smokeless tobacco: Never Used  Substance and Sexual Activity  .  Alcohol use: No  . Drug use: No  . Sexual activity: Yes    Birth control/protection: Surgical  Lifestyle  . Physical activity:    Days per week: Not on file    Minutes per session: Not on file  . Stress: Not on file  Relationships  . Social connections:    Talks on phone: Not on file    Gets together: Not on file    Attends religious service: Not on file    Active member of club or organization: Not on file    Attends meetings of clubs or organizations: Not on file    Relationship status: Not on file  Other Topics Concern  . Not on file  Social History Narrative  . Not on file   Outpatient Encounter Medications as of 12/27/2018  Medication Sig  . acetaZOLAMIDE (DIAMOX) 250 MG tablet Take 250 mg by mouth daily.   Marland Kitchen acyclovir (ZOVIRAX) 400 MG tablet Take 400 mg by mouth 3 (three) times daily as needed (for fever blisters).   Marland Kitchen atorvastatin (LIPITOR) 20 MG tablet TAKE ONE TABLET BY MOUTH DAILY.  . chlorhexidine (PERIDEX) 0.12 %  solution as needed.   . Cholecalciferol (VITAMIN D3) 2000 units capsule Take 4,000 Units by mouth daily.   . cyanocobalamin (,VITAMIN B-12,) 1000 MCG/ML injection Inject 1,000 mcg into the muscle every 30 (thirty) days.  . diazepam (VALIUM) 2 MG tablet Take 2 mg by mouth every 12 (twelve) hours as needed for anxiety.   . dicyclomine (BENTYL) 20 MG tablet Take 1 tablet (20 mg total) by mouth 3 (three) times daily.  . DULoxetine (CYMBALTA) 60 MG capsule Take 60 mg by mouth daily.  Marland Kitchen estradiol (ESTRACE) 0.5 MG tablet Take 0.5 mg by mouth every evening.   . fluticasone (FLONASE) 50 MCG/ACT nasal spray Place 2 sprays into both nostrils every morning.   Marland Kitchen glucose blood (FREESTYLE LITE) test strip Use as instructed  . HYDROcodone-acetaminophen (NORCO/VICODIN) 5-325 MG tablet Take 1 tablet by mouth every 6 (six) hours as needed for severe pain.  . Influenza Vac Subunit Quad (FLUCELVAX QUADRIVALENT) 0.5 ML SUSY Flucelvax Quad 2019-2020 (PF) 60 mcg (15 mcg x 4)/0.5 mL IM syringe  INJECT 0.5ML INTRAMUSCULARLY ONCE.  Marland Kitchen levothyroxine (SYNTHROID, LEVOTHROID) 75 MCG tablet TAKE ONE TABLET BY MOUTH DAILY BEFORE BREAKFAST  . metoprolol (LOPRESSOR) 50 MG tablet Take 25 mg by mouth daily.   . ondansetron (ZOFRAN) 4 MG tablet Take 1 tablet (4 mg total) by mouth every 8 (eight) hours as needed for nausea or vomiting.  . pantoprazole (PROTONIX) 40 MG tablet 40 mg daily.   Marland Kitchen PARoxetine (PAXIL) 40 MG tablet Take 20 mg by mouth daily.   Vladimir Faster Glycol-Propyl Glycol (SYSTANE OP) Place 1 drop into both eyes daily.  Marland Kitchen PRODIGY TWIST TOP LANCETS 28G MISC TEST TWICE DAILY  . promethazine (PHENERGAN) 25 MG tablet Take 25 mg by mouth every 6 (six) hours as needed for nausea. Only as needed  . tiZANidine (ZANAFLEX) 4 MG tablet Take 4 mg by mouth. Patient states that she takes as needed  . TRESIBA FLEXTOUCH 200 UNIT/ML SOPN Inject 74 Units into the skin at bedtime.  . Turmeric 500 MG CAPS Take 500 mg by mouth daily.   .  [DISCONTINUED] glyBURIDE-metformin (GLUCOVANCE) 5-500 MG tablet Take 1 tablet by mouth daily with breakfast.   . [DISCONTINUED] metFORMIN (GLUCOPHAGE) 500 MG tablet Take 1 tablet (500 mg total) by mouth 2 (two) times daily with a meal.  . [DISCONTINUED]  TRESIBA FLEXTOUCH 200 UNIT/ML SOPN INJECT 66 UNITS INTO THE SKIN AT BEDTIME. (Patient taking differently: Inject 70 Units into the skin at bedtime. )   No facility-administered encounter medications on file as of 12/27/2018.    ALLERGIES: Allergies  Allergen Reactions  . Flagyl [Metronidazole Hcl] Itching and Rash  . Nsaids Nausea Only   VACCINATION STATUS: Immunization History  Administered Date(s) Administered  . Influenza Inj Mdck Quad Pf 08/07/2017, 08/11/2018    Diabetes  She presents for her follow-up diabetic visit. She has type 2 diabetes mellitus. Onset time: She was diagnosed approximately at age 44 years. Her disease course has been worsening.  She reports morning glycemic range between 92 and 310, however mostly above 200 mg per DL.  Her postprandial blood glucose readings are between 162 200 mg per DL.  Her previsit A1c is 10.5%, increasing from 9.7%.  She does not report any major hypoglycemic episodes.  She is currently on Tresiba 65 units nightly, metformin 500 mg p.o. twice daily.  Since her last visit she was diagnosed with cirrhosis of the liver.    She is following a generally unhealthy diet. When asked about meal planning, she reported none. She has not had a previous visit with a dietitian. She never participates in exercise.   For her hypothyroidism, she is on levothyroxine 75 mcg p.o. nightly.  She reports compliance.  She has no new complaints.   Hyperlipidemia  This is a chronic problem. The current episode started more than 1 year ago. The problem is controlled. Exacerbating diseases include diabetes and obesity. Pertinent negatives include no chest pain, myalgias or shortness of breath. Current antihyperlipidemic  treatment includes statins. Risk factors for coronary artery disease include diabetes mellitus, dyslipidemia, obesity, a sedentary lifestyle and post-menopausal.     Objective:      CMP     Component Value Date/Time   NA 140 12/17/2018 0816   K 4.2 12/17/2018 0816   CL 106 12/17/2018 0816   CO2 25 12/17/2018 0816   GLUCOSE 159 (H) 12/17/2018 0816   BUN 13 12/17/2018 0816   BUN 17 06/21/2018   CREATININE 0.71 12/17/2018 0816   CALCIUM 8.9 12/17/2018 0816   PROT 6.7 12/17/2018 0816   ALBUMIN 3.4 (L) 10/13/2017 1013   AST 29 12/17/2018 0816   ALT 26 12/17/2018 0816   ALKPHOS 86 10/13/2017 1013   BILITOT 0.2 12/17/2018 0816   GFRNONAA 95 12/17/2018 0816   GFRAA 110 12/17/2018 0816    Diabetic Labs (most recent): Lab Results  Component Value Date   HGBA1C 10.5 (H) 12/17/2018   HGBA1C 9.7 (H) 09/17/2018   HGBA1C 8.8 (A) 06/21/2018   Lipid Panel     Component Value Date/Time   CHOL 146 10/13/2017 0816   TRIG 90 10/13/2017 0816   HDL 49 (L) 10/13/2017 0816   CHOLHDL 3.0 10/13/2017 0816   VLDL 29 06/30/2016 1251   LDLCALC 80 10/13/2017 0816   Recent Results (from the past 2160 hour(s))  AFP tumor marker     Status: None   Collection Time: 12/17/18  8:12 AM  Result Value Ref Range   AFP-Tumor Marker 3.6 ng/mL    Comment: Reference Range:   <6.1 The use of AFP as a tumor marker in pregnant females is not recommended. . . This test was performed using the Beckman Coulter chemiluminescent method. Values obtained from different assay methods cannot be used interchangeably. AFP levels, regardless of value, should not be interpreted as absolute evidence of the  presence or absence of disease. Marland Kitchen   Hemoglobin A1c     Status: Abnormal   Collection Time: 12/17/18  8:16 AM  Result Value Ref Range   Hgb A1c MFr Bld 10.5 (H) <5.7 % of total Hgb    Comment: For someone without known diabetes, a hemoglobin A1c value of 6.5% or greater indicates that they may have   diabetes and this should be confirmed with a follow-up  test. . For someone with known diabetes, a value <7% indicates  that their diabetes is well controlled and a value  greater than or equal to 7% indicates suboptimal  control. A1c targets should be individualized based on  duration of diabetes, age, comorbid conditions, and  other considerations. . Currently, no consensus exists regarding use of hemoglobin A1c for diagnosis of diabetes for children. .    Mean Plasma Glucose 255 (calc)   eAG (mmol/L) 14.1 (calc)  COMPLETE METABOLIC PANEL WITH GFR     Status: Abnormal   Collection Time: 12/17/18  8:16 AM  Result Value Ref Range   Glucose, Bld 159 (H) 65 - 99 mg/dL    Comment: .            Fasting reference interval . For someone without known diabetes, a glucose value >125 mg/dL indicates that they may have diabetes and this should be confirmed with a follow-up test. .    BUN 13 7 - 25 mg/dL   Creat 0.71 0.50 - 1.05 mg/dL    Comment: For patients >37 years of age, the reference limit for Creatinine is approximately 13% higher for people identified as African-American. .    GFR, Est Non African American 95 > OR = 60 mL/min/1.51m   GFR, Est African American 110 > OR = 60 mL/min/1.786m  BUN/Creatinine Ratio NOT APPLICABLE 6 - 22 (calc)   Sodium 140 135 - 146 mmol/L   Potassium 4.2 3.5 - 5.3 mmol/L   Chloride 106 98 - 110 mmol/L   CO2 25 20 - 32 mmol/L   Calcium 8.9 8.6 - 10.4 mg/dL   Total Protein 6.7 6.1 - 8.1 g/dL   Albumin 3.6 3.6 - 5.1 g/dL   Globulin 3.1 1.9 - 3.7 g/dL (calc)   AG Ratio 1.2 1.0 - 2.5 (calc)   Total Bilirubin 0.2 0.2 - 1.2 mg/dL   Alkaline phosphatase (APISO) 110 37 - 153 U/L   AST 29 10 - 35 U/L   ALT 26 6 - 29 U/L  VITAMIN D 25 Hydroxy (Vit-D Deficiency, Fractures)     Status: None   Collection Time: 12/17/18  8:16 AM  Result Value Ref Range   Vit D, 25-Hydroxy 43 30 - 100 ng/mL    Comment: Vitamin D Status         25-OH Vitamin  D: . Deficiency:                    <20 ng/mL Insufficiency:             20 - 29 ng/mL Optimal:                 > or = 30 ng/mL . For 25-OH Vitamin D testing on patients on  D2-supplementation and patients for whom quantitation  of D2 and D3 fractions is required, the QuestAssureD(TM) 25-OH VIT D, (D2,D3), LC/MS/MS is recommended: order  code [id number] >2y59yr . For more information on this test, go to: http://education.questdiagnostics.com/faq/FAQ163 (This link is being provided for  informational/educational purposes only.)      Assessment & Plan:   1. Uncontrolled type 2 diabetes mellitus with complication, with long-term current use of insulin -This is a telephone visit due to the coronavirus pandemic. - Patient has currently uncontrolled symptomatic type 2 DM since  58 years of age. -Her previsit labs show A1c of 10.5% increasing from 9.7%.  This is mainly due to her interval exposure to yet another round of steroids related to her back pain.     Recent labs reviewed.   Her diabetes is complicated by obesity, insulin resistance, and patient remains at a high risk for more acute and chronic complications of diabetes which include CAD, CVA, CKD, retinopathy, and neuropathy. These are all discussed in detail with the patient.  - I have counseled the patient on diet management and weight loss, by adopting a carbohydrate restricted/protein rich diet.   -  Suggestion is made for her to avoid simple carbohydrates  from her diet including Cakes, Sweet Desserts / Pastries, Ice Cream, Soda (diet and regular), Sweet Tea, Candies, Chips, Cookies, Store Bought Juices, Alcohol in Excess of  1-2 drinks a day, Artificial Sweeteners, and "Sugar-free" Products. This will help patient to have stable blood glucose profile and potentially avoid unintended weight gain.    - I encouraged the patient to switch to  unprocessed or minimally processed complex starch and increased protein intake  (animal or plant source), fruits, and vegetables.  - Patient is advised to stick to a routine mealtimes to eat 3 meals  a day and avoid unnecessary snacks ( to snack only to correct hypoglycemia).  - She declines referral to CDE for now.  - I have approached patient with the following individualized plan to manage diabetes and patient agrees:   -Based on her reports of glycemic profile, she will tolerate a higher dose of basal insulin.  She is advised to increase Tresiba to 74 units nightly associated with monitoring of blood glucose at least 2 times a day-daily before breakfast and at bedtime.   - Continue to hold prandial insulin for now.    -Patient is encouraged to call clinic for blood glucose levels less than 70 or above 200 mg /dl. -Her renal function is back to normal.  She is advised to continue metformin 500 mg p.o. twice daily after breakfast and supper,  therapeutically suitable for patient.  -  She has history of intolerance for [place], likely would not tolerate other  incretin therapy.  - Patient specific target  A1c;  LDL, HDL, Triglycerides, and  Waist Circumference were discussed in detail.  2) Lipids/HPL: Her recent lab showed controlled LDL of 80, generally improving from 141.   I  advised her to continue atorvastatin 20 mg p.o. nightly.   Side effects and precautions discussed with her.  3) hypothyroidism:  -She is advised to continue levothyroxine 75 mcg p.o. every morning.  - We discussed about the correct intake of her thyroid hormone, on empty stomach at fasting, with water, separated by at least 30 minutes from breakfast and other medications,  and separated by more than 4 hours from calcium, iron, multivitamins, acid reflux medications (PPIs). -Patient is made aware of the fact that thyroid hormone replacement is needed for life, dose to be adjusted by periodic monitoring of thyroid function tests.   - I advised patient to maintain close follow up with Glenda Chroman, MD for primary care needs.  - Time spent with the patient: 25 min, of  which >50% was spent in reviewing her blood glucose logs , discussing her hypoglycemia and hyperglycemia episodes, reviewing her current and  previous labs / studies and medications  doses and developing a plan to avoid hypoglycemia and hyperglycemia.   [name] participated in the discussions, expressed understanding, and voiced agreement with the above plans.  All questions were answered to her satisfaction. she is encouraged to contact clinic should she have any questions or concerns prior to her return visit.   Follow up plan: She is advised to return in 3 months with repeat labs and meter/logs.  Glade Lloyd, MD Phone: [phone]  Fax: [phone]  This note was partially dictated with voice recognition software. Similar sounding words can be transcribed inadequately or may not  be corrected upon review.  12/27/2018, 1:59 PM

## 2018-12-29 ENCOUNTER — Telehealth: Payer: Self-pay | Admitting: Podiatry

## 2018-12-29 DIAGNOSIS — M25561 Pain in right knee: Secondary | ICD-10-CM | POA: Diagnosis not present

## 2018-12-29 DIAGNOSIS — R531 Weakness: Secondary | ICD-10-CM | POA: Diagnosis not present

## 2018-12-29 DIAGNOSIS — R262 Difficulty in walking, not elsewhere classified: Secondary | ICD-10-CM | POA: Diagnosis not present

## 2018-12-29 NOTE — Telephone Encounter (Signed)
Pt left message stating she is still having pain on her surgical foot and big toe on top and asking if she could get some pain medication.

## 2018-12-30 DIAGNOSIS — M25561 Pain in right knee: Secondary | ICD-10-CM | POA: Diagnosis not present

## 2018-12-30 DIAGNOSIS — R262 Difficulty in walking, not elsewhere classified: Secondary | ICD-10-CM | POA: Diagnosis not present

## 2018-12-30 DIAGNOSIS — R531 Weakness: Secondary | ICD-10-CM | POA: Diagnosis not present

## 2018-12-30 MED ORDER — HYDROCODONE-ACETAMINOPHEN 5-325 MG PO TABS: 1.0000 | ORAL_TABLET | Freq: Four times a day (QID) | ORAL | 0 refills | Status: DC | PRN

## 2019-01-07 DIAGNOSIS — I10 Essential (primary) hypertension: Secondary | ICD-10-CM | POA: Diagnosis not present

## 2019-01-07 DIAGNOSIS — M159 Polyosteoarthritis, unspecified: Secondary | ICD-10-CM | POA: Diagnosis not present

## 2019-01-07 DIAGNOSIS — E119 Type 2 diabetes mellitus without complications: Secondary | ICD-10-CM | POA: Diagnosis not present

## 2019-01-11 ENCOUNTER — Other Ambulatory Visit: Payer: Self-pay | Admitting: "Endocrinology

## 2019-01-11 MED ORDER — INSULIN ASPART 100 UNIT/ML FLEXPEN: 10.0000 [IU] | PEN_INJECTOR | Freq: Three times a day (TID) | SUBCUTANEOUS | 2 refills | Status: DC

## 2019-01-11 MED ORDER — TRESIBA FLEXTOUCH 200 UNIT/ML ~~LOC~~ SOPN: 80.0000 [IU] | PEN_INJECTOR | Freq: Every day | SUBCUTANEOUS | 2 refills | Status: DC

## 2019-01-12 DIAGNOSIS — F419 Anxiety disorder, unspecified: Secondary | ICD-10-CM | POA: Diagnosis not present

## 2019-01-12 DIAGNOSIS — Z299 Encounter for prophylactic measures, unspecified: Secondary | ICD-10-CM | POA: Diagnosis not present

## 2019-01-12 DIAGNOSIS — Z79899 Other long term (current) drug therapy: Secondary | ICD-10-CM | POA: Diagnosis not present

## 2019-01-12 DIAGNOSIS — Z6831 Body mass index (BMI) 31.0-31.9, adult: Secondary | ICD-10-CM | POA: Diagnosis not present

## 2019-01-12 DIAGNOSIS — M25569 Pain in unspecified knee: Secondary | ICD-10-CM | POA: Diagnosis not present

## 2019-01-12 DIAGNOSIS — E7800 Pure hypercholesterolemia, unspecified: Secondary | ICD-10-CM | POA: Diagnosis not present

## 2019-01-17 ENCOUNTER — Other Ambulatory Visit: Payer: Self-pay | Admitting: "Endocrinology

## 2019-01-18 DIAGNOSIS — Z6831 Body mass index (BMI) 31.0-31.9, adult: Secondary | ICD-10-CM | POA: Diagnosis not present

## 2019-01-18 DIAGNOSIS — M25561 Pain in right knee: Secondary | ICD-10-CM | POA: Diagnosis not present

## 2019-01-18 DIAGNOSIS — M25569 Pain in unspecified knee: Secondary | ICD-10-CM | POA: Diagnosis not present

## 2019-01-18 DIAGNOSIS — Z299 Encounter for prophylactic measures, unspecified: Secondary | ICD-10-CM | POA: Diagnosis not present

## 2019-01-18 DIAGNOSIS — W57XXXA Bitten or stung by nonvenomous insect and other nonvenomous arthropods, initial encounter: Secondary | ICD-10-CM | POA: Diagnosis not present

## 2019-01-18 DIAGNOSIS — I10 Essential (primary) hypertension: Secondary | ICD-10-CM | POA: Diagnosis not present

## 2019-01-24 DIAGNOSIS — Z713 Dietary counseling and surveillance: Secondary | ICD-10-CM | POA: Diagnosis not present

## 2019-01-24 DIAGNOSIS — Z299 Encounter for prophylactic measures, unspecified: Secondary | ICD-10-CM | POA: Diagnosis not present

## 2019-01-24 DIAGNOSIS — I10 Essential (primary) hypertension: Secondary | ICD-10-CM | POA: Diagnosis not present

## 2019-01-24 DIAGNOSIS — Z6831 Body mass index (BMI) 31.0-31.9, adult: Secondary | ICD-10-CM | POA: Diagnosis not present

## 2019-01-24 DIAGNOSIS — M25569 Pain in unspecified knee: Secondary | ICD-10-CM | POA: Diagnosis not present

## 2019-01-27 ENCOUNTER — Other Ambulatory Visit: Payer: Self-pay | Admitting: "Endocrinology

## 2019-02-10 ENCOUNTER — Ambulatory Visit: Payer: Medicare Other | Admitting: Podiatry

## 2019-02-22 DIAGNOSIS — M25561 Pain in right knee: Secondary | ICD-10-CM | POA: Diagnosis not present

## 2019-02-22 DIAGNOSIS — R531 Weakness: Secondary | ICD-10-CM | POA: Diagnosis not present

## 2019-02-22 DIAGNOSIS — R262 Difficulty in walking, not elsewhere classified: Secondary | ICD-10-CM | POA: Diagnosis not present

## 2019-02-24 DIAGNOSIS — R262 Difficulty in walking, not elsewhere classified: Secondary | ICD-10-CM | POA: Diagnosis not present

## 2019-02-24 DIAGNOSIS — M25561 Pain in right knee: Secondary | ICD-10-CM | POA: Diagnosis not present

## 2019-02-24 DIAGNOSIS — R531 Weakness: Secondary | ICD-10-CM | POA: Diagnosis not present

## 2019-02-26 ENCOUNTER — Other Ambulatory Visit: Payer: Self-pay | Admitting: "Endocrinology

## 2019-03-01 ENCOUNTER — Telehealth: Payer: Self-pay | Admitting: Podiatry

## 2019-03-01 DIAGNOSIS — R262 Difficulty in walking, not elsewhere classified: Secondary | ICD-10-CM | POA: Diagnosis not present

## 2019-03-01 DIAGNOSIS — R531 Weakness: Secondary | ICD-10-CM | POA: Diagnosis not present

## 2019-03-01 DIAGNOSIS — M25561 Pain in right knee: Secondary | ICD-10-CM | POA: Diagnosis not present

## 2019-03-01 NOTE — Telephone Encounter (Signed)
Pt is in pain, she is asking for pain medication

## 2019-03-01 NOTE — Telephone Encounter (Signed)
Left message informing pt Dr. March Rummage would need to reevaluate her if she was having pain after 2 months, to call our office for an appt and to rest, ice and elevate until seen in-office.

## 2019-03-09 DIAGNOSIS — M25569 Pain in unspecified knee: Secondary | ICD-10-CM | POA: Diagnosis not present

## 2019-03-09 DIAGNOSIS — Z6832 Body mass index (BMI) 32.0-32.9, adult: Secondary | ICD-10-CM | POA: Diagnosis not present

## 2019-03-09 DIAGNOSIS — M25559 Pain in unspecified hip: Secondary | ICD-10-CM | POA: Diagnosis not present

## 2019-03-09 DIAGNOSIS — I10 Essential (primary) hypertension: Secondary | ICD-10-CM | POA: Diagnosis not present

## 2019-03-09 DIAGNOSIS — M25561 Pain in right knee: Secondary | ICD-10-CM | POA: Diagnosis not present

## 2019-03-09 DIAGNOSIS — Z299 Encounter for prophylactic measures, unspecified: Secondary | ICD-10-CM | POA: Diagnosis not present

## 2019-03-09 DIAGNOSIS — R262 Difficulty in walking, not elsewhere classified: Secondary | ICD-10-CM | POA: Diagnosis not present

## 2019-03-09 DIAGNOSIS — F329 Major depressive disorder, single episode, unspecified: Secondary | ICD-10-CM | POA: Diagnosis not present

## 2019-03-09 DIAGNOSIS — R531 Weakness: Secondary | ICD-10-CM | POA: Diagnosis not present

## 2019-03-10 DIAGNOSIS — M25561 Pain in right knee: Secondary | ICD-10-CM | POA: Diagnosis not present

## 2019-03-10 DIAGNOSIS — R262 Difficulty in walking, not elsewhere classified: Secondary | ICD-10-CM | POA: Diagnosis not present

## 2019-03-10 DIAGNOSIS — R531 Weakness: Secondary | ICD-10-CM | POA: Diagnosis not present

## 2019-03-24 DIAGNOSIS — I10 Essential (primary) hypertension: Secondary | ICD-10-CM | POA: Diagnosis not present

## 2019-03-24 DIAGNOSIS — K219 Gastro-esophageal reflux disease without esophagitis: Secondary | ICD-10-CM | POA: Diagnosis not present

## 2019-03-24 DIAGNOSIS — Z87442 Personal history of urinary calculi: Secondary | ICD-10-CM | POA: Diagnosis not present

## 2019-03-24 DIAGNOSIS — E1165 Type 2 diabetes mellitus with hyperglycemia: Secondary | ICD-10-CM | POA: Diagnosis not present

## 2019-03-24 DIAGNOSIS — E1140 Type 2 diabetes mellitus with diabetic neuropathy, unspecified: Secondary | ICD-10-CM | POA: Diagnosis not present

## 2019-03-24 DIAGNOSIS — R197 Diarrhea, unspecified: Secondary | ICD-10-CM | POA: Diagnosis not present

## 2019-03-24 DIAGNOSIS — K7460 Unspecified cirrhosis of liver: Secondary | ICD-10-CM | POA: Diagnosis not present

## 2019-03-24 DIAGNOSIS — K429 Umbilical hernia without obstruction or gangrene: Secondary | ICD-10-CM | POA: Diagnosis not present

## 2019-03-24 DIAGNOSIS — K5790 Diverticulosis of intestine, part unspecified, without perforation or abscess without bleeding: Secondary | ICD-10-CM | POA: Diagnosis not present

## 2019-03-24 DIAGNOSIS — R1031 Right lower quadrant pain: Secondary | ICD-10-CM | POA: Diagnosis not present

## 2019-03-24 DIAGNOSIS — Z90711 Acquired absence of uterus with remaining cervical stump: Secondary | ICD-10-CM | POA: Diagnosis not present

## 2019-03-24 DIAGNOSIS — Z794 Long term (current) use of insulin: Secondary | ICD-10-CM | POA: Diagnosis not present

## 2019-03-24 DIAGNOSIS — K5730 Diverticulosis of large intestine without perforation or abscess without bleeding: Secondary | ICD-10-CM | POA: Diagnosis not present

## 2019-03-24 DIAGNOSIS — D72829 Elevated white blood cell count, unspecified: Secondary | ICD-10-CM | POA: Diagnosis not present

## 2019-03-24 DIAGNOSIS — Z9049 Acquired absence of other specified parts of digestive tract: Secondary | ICD-10-CM | POA: Diagnosis not present

## 2019-03-24 DIAGNOSIS — M1990 Unspecified osteoarthritis, unspecified site: Secondary | ICD-10-CM | POA: Diagnosis not present

## 2019-03-25 DIAGNOSIS — K429 Umbilical hernia without obstruction or gangrene: Secondary | ICD-10-CM | POA: Diagnosis not present

## 2019-03-25 DIAGNOSIS — K7460 Unspecified cirrhosis of liver: Secondary | ICD-10-CM | POA: Diagnosis not present

## 2019-03-25 DIAGNOSIS — K5730 Diverticulosis of large intestine without perforation or abscess without bleeding: Secondary | ICD-10-CM | POA: Diagnosis not present

## 2019-03-29 DIAGNOSIS — Z299 Encounter for prophylactic measures, unspecified: Secondary | ICD-10-CM | POA: Diagnosis not present

## 2019-03-29 DIAGNOSIS — I10 Essential (primary) hypertension: Secondary | ICD-10-CM | POA: Diagnosis not present

## 2019-03-29 DIAGNOSIS — Z79899 Other long term (current) drug therapy: Secondary | ICD-10-CM | POA: Diagnosis not present

## 2019-03-29 DIAGNOSIS — M25569 Pain in unspecified knee: Secondary | ICD-10-CM | POA: Diagnosis not present

## 2019-03-29 DIAGNOSIS — B370 Candidal stomatitis: Secondary | ICD-10-CM | POA: Diagnosis not present

## 2019-03-29 DIAGNOSIS — Z6832 Body mass index (BMI) 32.0-32.9, adult: Secondary | ICD-10-CM | POA: Diagnosis not present

## 2019-03-30 ENCOUNTER — Other Ambulatory Visit: Payer: Self-pay | Admitting: Nurse Practitioner

## 2019-03-30 ENCOUNTER — Ambulatory Visit: Payer: Medicare Other | Admitting: "Endocrinology

## 2019-03-30 DIAGNOSIS — M545 Low back pain, unspecified: Secondary | ICD-10-CM

## 2019-03-30 DIAGNOSIS — G8929 Other chronic pain: Secondary | ICD-10-CM

## 2019-04-11 ENCOUNTER — Other Ambulatory Visit: Payer: Self-pay | Admitting: "Endocrinology

## 2019-04-12 ENCOUNTER — Other Ambulatory Visit: Payer: Self-pay

## 2019-04-12 ENCOUNTER — Ambulatory Visit
Admission: RE | Admit: 2019-04-12 | Discharge: 2019-04-12 | Disposition: A | Discharge status: Home / Self Care | Payer: Medicare Other | Source: Ambulatory Visit | Attending: Nurse Practitioner | Admitting: Nurse Practitioner

## 2019-04-12 ENCOUNTER — Other Ambulatory Visit: Payer: Self-pay | Admitting: "Endocrinology

## 2019-04-12 DIAGNOSIS — M545 Low back pain, unspecified: Secondary | ICD-10-CM

## 2019-04-12 DIAGNOSIS — Z794 Long term (current) use of insulin: Secondary | ICD-10-CM | POA: Diagnosis not present

## 2019-04-12 DIAGNOSIS — E1165 Type 2 diabetes mellitus with hyperglycemia: Secondary | ICD-10-CM | POA: Diagnosis not present

## 2019-04-12 DIAGNOSIS — G8929 Other chronic pain: Secondary | ICD-10-CM

## 2019-04-12 DIAGNOSIS — E118 Type 2 diabetes mellitus with unspecified complications: Secondary | ICD-10-CM | POA: Diagnosis not present

## 2019-04-12 DIAGNOSIS — M4727 Other spondylosis with radiculopathy, lumbosacral region: Secondary | ICD-10-CM | POA: Diagnosis not present

## 2019-04-12 MED ORDER — METHYLPREDNISOLONE ACETATE 40 MG/ML INJ SUSP (RADIOLOG
120.0000 mg | Freq: Once | INTRAMUSCULAR | Status: AC
  Administered 2019-04-12: 13:00:00 120 mg via EPIDURAL

## 2019-04-12 MED ORDER — IOPAMIDOL (ISOVUE-M 200) INJECTION 41%
1.0000 mL | Freq: Once | INTRAMUSCULAR | Status: AC
  Administered 2019-04-12: 1 mL via EPIDURAL

## 2019-04-13 ENCOUNTER — Other Ambulatory Visit: Payer: Self-pay | Admitting: "Endocrinology

## 2019-04-13 DIAGNOSIS — F419 Anxiety disorder, unspecified: Secondary | ICD-10-CM | POA: Diagnosis not present

## 2019-04-13 DIAGNOSIS — I10 Essential (primary) hypertension: Secondary | ICD-10-CM | POA: Diagnosis not present

## 2019-04-13 DIAGNOSIS — Z6832 Body mass index (BMI) 32.0-32.9, adult: Secondary | ICD-10-CM | POA: Diagnosis not present

## 2019-04-13 DIAGNOSIS — Z713 Dietary counseling and surveillance: Secondary | ICD-10-CM | POA: Diagnosis not present

## 2019-04-13 DIAGNOSIS — Z299 Encounter for prophylactic measures, unspecified: Secondary | ICD-10-CM | POA: Diagnosis not present

## 2019-04-13 LAB — COMPLETE METABOLIC PANEL WITH GFR
AG Ratio: 1.1 (calc) (ref 1.0–2.5)
ALT: 29 U/L (ref 6–29)
AST: 50 U/L — ABNORMAL HIGH (ref 10–35)
Albumin: 3.9 g/dL (ref 3.6–5.1)
Alkaline phosphatase (APISO): 155 U/L — ABNORMAL HIGH (ref 37–153)
BUN: 13 mg/dL (ref 7–25)
CO2: 26 mmol/L (ref 20–32)
Calcium: 9.4 mg/dL (ref 8.6–10.4)
Chloride: 102 mmol/L (ref 98–110)
Creat: 0.87 mg/dL (ref 0.50–1.05)
GFR, Est African American: 86 mL/min/{1.73_m2} (ref >=60)
GFR, Est Non African American: 74 mL/min/{1.73_m2} (ref >=60)
Globulin: 3.6 g/dL (calc) (ref 1.9–3.7)
Glucose, Bld: 128 mg/dL (ref 65–139)
Potassium: 4 mmol/L (ref 3.5–5.3)
Sodium: 137 mmol/L (ref 135–146)
Total Bilirubin: 0.5 mg/dL (ref 0.2–1.2)
Total Protein: 7.5 g/dL (ref 6.1–8.1)

## 2019-04-13 LAB — HEMOGLOBIN A1C
Hgb A1c MFr Bld: 12.4 % of total Hgb — ABNORMAL HIGH (ref <5.7)
Mean Plasma Glucose: 309 (calc)
eAG (mmol/L): 17.1 (calc)

## 2019-04-14 ENCOUNTER — Encounter: Payer: Self-pay | Admitting: Podiatry

## 2019-04-14 ENCOUNTER — Ambulatory Visit (INDEPENDENT_AMBULATORY_CARE_PROVIDER_SITE_OTHER): Payer: Medicare Other | Admitting: Podiatry

## 2019-04-14 ENCOUNTER — Ambulatory Visit (INDEPENDENT_AMBULATORY_CARE_PROVIDER_SITE_OTHER): Payer: Medicare Other

## 2019-04-14 ENCOUNTER — Other Ambulatory Visit: Payer: Self-pay

## 2019-04-14 ENCOUNTER — Other Ambulatory Visit: Payer: Self-pay | Admitting: Podiatry

## 2019-04-14 ENCOUNTER — Telehealth: Payer: Self-pay | Admitting: *Deleted

## 2019-04-14 VITALS — Temp 97.9°F

## 2019-04-14 DIAGNOSIS — S9032XD Contusion of left foot, subsequent encounter: Secondary | ICD-10-CM

## 2019-04-14 DIAGNOSIS — S86312S Strain of muscle(s) and tendon(s) of peroneal muscle group at lower leg level, left leg, sequela: Secondary | ICD-10-CM

## 2019-04-14 DIAGNOSIS — M25372 Other instability, left ankle: Secondary | ICD-10-CM

## 2019-04-14 DIAGNOSIS — M7672 Peroneal tendinitis, left leg: Secondary | ICD-10-CM | POA: Diagnosis not present

## 2019-04-14 DIAGNOSIS — M7752 Other enthesopathy of left foot: Secondary | ICD-10-CM

## 2019-04-14 DIAGNOSIS — H2513 Age-related nuclear cataract, bilateral: Secondary | ICD-10-CM | POA: Diagnosis not present

## 2019-04-14 NOTE — Telephone Encounter (Signed)
-----   Message from [name], DPM sent at 04/14/2019  1:03 PM EDT ----- Can we order left ankle MRI? Eval for left perooneal tendon tear. Pre-op planning

## 2019-04-14 NOTE — Telephone Encounter (Signed)
Faxed orders to [facility].

## 2019-04-14 NOTE — Progress Notes (Signed)
Subjective:  Patient ID: [name], female    DOB: [birth year],  MRN: [id number]  Chief Complaint  Patient presents with   Foot Problem    pt is here for a 1 month f/u on her left foot, pt states that it swells from time to time, pt puts pain scale as a 5 out of 10, pt states that she has not been wearing a cam boot    58 y.o. female presents with the above complaint. History above confirmed with patient.  Review of Systems: Negative except as noted in the HPI. Denies N/V/F/Ch.  Past Medical History:  Diagnosis Date   Anxiety disorder    Chronic low back pain    Cirrhosis of liver ([facility])    Colitis    Diabetes mellitus    Type II   Diabetic neuropathy ([facility])    Diverticulitis    Fatty liver    GERD (gastroesophageal reflux disease)    High cholesterol    Hypertension    Irritable bowel syndrome    Meniere's disease    Neuropathy    Pain     Current Outpatient Medications:    acetaZOLAMIDE (DIAMOX) 250 MG tablet, Take 250 mg by mouth daily. , Disp: , Rfl:    acyclovir (ZOVIRAX) 400 MG tablet, Take 400 mg by mouth 3 (three) times daily as needed (for fever blisters). , Disp: , Rfl:    atorvastatin (LIPITOR) 20 MG tablet, TAKE ONE TABLET BY MOUTH DAILY., Disp: 90 tablet, Rfl: 0   chlorhexidine (PERIDEX) 0.12 % solution, as needed. , Disp: , Rfl:    Cholecalciferol (VITAMIN D3) 2000 units capsule, Take 4,000 Units by mouth daily. , Disp: , Rfl:    cyanocobalamin (,VITAMIN B-12,) 1000 MCG/ML injection, Inject 1,000 mcg into the muscle every 30 (thirty) days., Disp: , Rfl: 5   diazepam (VALIUM) 2 MG tablet, Take 2 mg by mouth every 12 (twelve) hours as needed for anxiety. , Disp: , Rfl:    dicyclomine (BENTYL) 20 MG tablet, Take 1 tablet (20 mg total) by mouth 3 (three) times daily., Disp: 90 tablet, Rfl: 5   DULoxetine (CYMBALTA) 60 MG capsule, Take 60 mg by mouth daily., Disp: , Rfl:    estradiol (ESTRACE) 0.5 MG tablet, Take 0.5 mg by mouth every  evening. , Disp: , Rfl:    fluconazole (DIFLUCAN) 150 MG tablet, , Disp: , Rfl:    fluticasone (FLONASE) 50 MCG/ACT nasal spray, Place 2 sprays into both nostrils every morning. , Disp: , Rfl:    glucose blood (FREESTYLE LITE) test strip, Use as instructed, Disp: 100 each, Rfl: 2   hydrochlorothiazide (HYDRODIURIL) 25 MG tablet, , Disp: , Rfl:    HYDROcodone-acetaminophen (NORCO/VICODIN) 5-325 MG tablet, Take 1 tablet by mouth every 6 (six) hours as needed for severe pain., Disp: 12 tablet, Rfl: 0   Influenza Vac Subunit Quad (FLUCELVAX QUADRIVALENT) 0.5 ML SUSY, Flucelvax Quad 2019-2020 (PF) 60 mcg (15 mcg x 4)/0.5 mL IM syringe  INJECT 0.5ML INTRAMUSCULARLY ONCE., Disp: , Rfl:    levothyroxine (SYNTHROID) 75 MCG tablet, TAKE ONE TABLET BY MOUTH DAILY BEFORE BREAKFAST, Disp: 30 tablet, Rfl: 3   metFORMIN (GLUCOPHAGE) 500 MG tablet, TAKE ONE TABLET BY MOUTH TWICE DAILY WITH A MEAL., Disp: 180 tablet, Rfl: 0   metoprolol (LOPRESSOR) 50 MG tablet, Take 25 mg by mouth daily. , Disp: , Rfl:    NOVOLOG FLEXPEN 100 UNIT/ML FlexPen, INJECT TEN UNITS SUBCUTANEOUSLY THREE TIMES DAILY WITH MEALS. ONLY USE when glucose  is above 90 BEFORE MEALS., Disp: 15 mL, Rfl: 2   ondansetron (ZOFRAN) 4 MG tablet, Take 1 tablet (4 mg total) by mouth every 8 (eight) hours as needed for nausea or vomiting., Disp: 12 tablet, Rfl: 0   pantoprazole (PROTONIX) 40 MG tablet, 40 mg daily. , Disp: , Rfl: 4   PARoxetine (PAXIL) 40 MG tablet, Take 20 mg by mouth daily. , Disp: , Rfl:    Polyethyl Glycol-Propyl Glycol (SYSTANE OP), Place 1 drop into both eyes daily., Disp: , Rfl:    PRODIGY TWIST TOP LANCETS 28G MISC, TEST TWICE DAILY, Disp: 200 each, Rfl: 2   promethazine (PHENERGAN) 25 MG tablet, Take 25 mg by mouth every 6 (six) hours as needed for nausea. Only as needed, Disp: , Rfl:    tiZANidine (ZANAFLEX) 4 MG tablet, Take 4 mg by mouth. Patient states that she takes as needed, Disp: , Rfl:    TRESIBA  FLEXTOUCH 200 UNIT/ML SOPN, INJECT 66 UNITS INTO THE SKIN AT BEDTIME., Disp: 9 mL, Rfl: 2   Turmeric 500 MG CAPS, Take 500 mg by mouth daily. , Disp: , Rfl:   Social History   Tobacco Use  Smoking Status Never Smoker  Smokeless Tobacco Never Used    Allergies  Allergen Reactions   Flagyl [Metronidazole Hcl] Itching and Rash   Nsaids Nausea Only   Objective:   Vitals:   04/14/19 0929  Temp: 97.9 F (36.6 C)   There is no height or weight on file to calculate BMI. Constitutional Well developed. Well nourished.  Vascular Dorsalis pedis pulses palpable bilaterally. Posterior tibial pulses palpable bilaterally. Capillary refill normal to all digits.  No cyanosis or clubbing noted. Pedal hair growth normal.  Neurologic Normal speech. Oriented to person, place, and time. Epicritic sensation to light touch grossly present bilaterally.  Dermatologic Nails well groomed and normal in appearance. No open wounds. No skin lesions.  Orthopedic: POP Left first and second MPJs.  Pain palpation about the left peroneal tendons, pain at left ATFL   Radiographs: Taken and reviewed no occult fracture. Assessment:   1. Contusion of left foot, subsequent encounter    Plan:  Patient was evaluated and treated and all questions answered.  Left foot first MPJ, second MPJ capsulitis -Injections delivered to both MPJs as below  Procedure: Joint Injection Location: Left first, second MPJ joint Skin Prep: Alcohol. Injectate: 0.5 cc 1% lidocaine plain, 0.5 cc dexamethasone phosphate. Disposition: Patient tolerated procedure well. Injection site dressed with a band-aid.  Left ankle chronic peroneal pain concern for re-tear -Discussed possible concern of re-tearing of the peroneal tendons.  Discussed possible need for repair of the tendon again. -Order MRI for further eval. needed for surgical planning  No follow-ups on file.

## 2019-04-15 NOTE — Telephone Encounter (Signed)
[place], "THIS [place] MEDICAID MEMBER DOES NOT REQUIRE PRIOR AUTHORIZATION FOR OUTPATIENT RADIOLOGY THROUGH [facility] OR [facility] AT THIS TIME."

## 2019-04-25 ENCOUNTER — Telehealth: Payer: Self-pay | Admitting: Podiatry

## 2019-04-25 NOTE — Telephone Encounter (Signed)
Pt called saying she saw Dr. March Rummage on 09 July and he was going to order an MRI. Told pt to call [facility] at [phone] and they should have her orders.

## 2019-04-26 DIAGNOSIS — E1165 Type 2 diabetes mellitus with hyperglycemia: Secondary | ICD-10-CM | POA: Diagnosis not present

## 2019-04-26 DIAGNOSIS — M7050 Other bursitis of knee, unspecified knee: Secondary | ICD-10-CM | POA: Diagnosis not present

## 2019-04-26 DIAGNOSIS — M1711 Unilateral primary osteoarthritis, right knee: Secondary | ICD-10-CM | POA: Diagnosis not present

## 2019-04-26 DIAGNOSIS — Z299 Encounter for prophylactic measures, unspecified: Secondary | ICD-10-CM | POA: Diagnosis not present

## 2019-04-26 DIAGNOSIS — M7051 Other bursitis of knee, right knee: Secondary | ICD-10-CM | POA: Diagnosis not present

## 2019-04-26 DIAGNOSIS — Z6832 Body mass index (BMI) 32.0-32.9, adult: Secondary | ICD-10-CM | POA: Diagnosis not present

## 2019-04-26 DIAGNOSIS — I10 Essential (primary) hypertension: Secondary | ICD-10-CM | POA: Diagnosis not present

## 2019-04-27 ENCOUNTER — Encounter: Payer: Self-pay | Admitting: "Endocrinology

## 2019-04-27 ENCOUNTER — Other Ambulatory Visit: Payer: Self-pay

## 2019-04-27 ENCOUNTER — Ambulatory Visit (INDEPENDENT_AMBULATORY_CARE_PROVIDER_SITE_OTHER): Payer: Medicare Other | Admitting: "Endocrinology

## 2019-04-27 DIAGNOSIS — E118 Type 2 diabetes mellitus with unspecified complications: Secondary | ICD-10-CM

## 2019-04-27 DIAGNOSIS — Z794 Long term (current) use of insulin: Secondary | ICD-10-CM | POA: Diagnosis not present

## 2019-04-27 DIAGNOSIS — E1165 Type 2 diabetes mellitus with hyperglycemia: Secondary | ICD-10-CM | POA: Diagnosis not present

## 2019-04-27 DIAGNOSIS — I10 Essential (primary) hypertension: Secondary | ICD-10-CM | POA: Diagnosis not present

## 2019-04-27 DIAGNOSIS — E782 Mixed hyperlipidemia: Secondary | ICD-10-CM | POA: Diagnosis not present

## 2019-04-27 DIAGNOSIS — E039 Hypothyroidism, unspecified: Secondary | ICD-10-CM | POA: Diagnosis not present

## 2019-04-27 DIAGNOSIS — IMO0002 Reserved for concepts with insufficient information to code with codable children: Secondary | ICD-10-CM

## 2019-04-27 MED ORDER — NOVOLOG FLEXPEN 100 UNIT/ML ~~LOC~~ SOPN: 15.0000 [IU] | PEN_INJECTOR | Freq: Three times a day (TID) | SUBCUTANEOUS | 2 refills | Status: DC

## 2019-04-27 MED ORDER — TRESIBA FLEXTOUCH 200 UNIT/ML ~~LOC~~ SOPN: 90.0000 [IU] | PEN_INJECTOR | Freq: Every day | SUBCUTANEOUS | 2 refills | Status: DC

## 2019-04-27 MED ORDER — METFORMIN HCL 500 MG PO TABS: 500.0000 mg | ORAL_TABLET | Freq: Two times a day (BID) | ORAL | 1 refills | Status: DC

## 2019-04-27 NOTE — Progress Notes (Signed)
04/27/2019                                                    Endocrinology Telehealth Visit Follow up Note -During COVID -19 Pandemic  This visit type was conducted due to national recommendations for restrictions regarding the COVID-19 Pandemic  in an effort to limit this patient's exposure and mitigate transmission of the corona virus.  Due to her co-morbid illnesses, [name] is at  moderate to high risk for complications without adequate follow up.  This format is felt to be most appropriate for her at this time.  I connected with this patient on 04/27/2019   by telephone and verified that I am speaking with the correct person using two identifiers. [name], Mar 03, 1961. she has verbally consented to this visit. All issues noted in this document were discussed and addressed. The format was not optimal for physical exam.    Subjective:    Patient ID: [name], female    DOB: 1961/02/18.  This is a telephone for f/u in  management of diabetes,  hyperlipidemia, hypothyroidism during the coronavirus pandemic.    PMD: Glenda Chroman, MD  Past Medical History:  Diagnosis Date  . Anxiety disorder   . Chronic low back pain   . Cirrhosis of liver ([place])   . Colitis   . Diabetes mellitus    Type II  . Diabetic neuropathy ([place])   . Diverticulitis   . Fatty liver   . GERD (gastroesophageal reflux disease)   . High cholesterol   . Hypertension   . Irritable bowel syndrome   . Meniere's disease   . Neuropathy   . Pain    Past Surgical History:  Procedure Laterality Date  . ABDOMINAL HYSTERECTOMY    . BIOPSY  03/20/2017   Procedure: BIOPSY;  Surgeon: [name], MD;  [facility];  Service: Endoscopy;;  colon gastric  . bladder tact  2005  . CHOLECYSTECTOMY  08/11  . COLONOSCOPY  2208  . COLONOSCOPY  In of September 2014   @ [facility]/Dr,.[name]  . COLONOSCOPY WITH PROPOFOL N/A 03/20/2017   Procedure: COLONOSCOPY WITH PROPOFOL;  Surgeon: [name], MD;   [facility];  Service: Endoscopy;  Laterality: N/A;  . ECTOPIC PREGNANCY SURGERY  1996  . ESOPHAGOGASTRODUODENOSCOPY (EGD) WITH PROPOFOL N/A 03/20/2017   Procedure: ESOPHAGOGASTRODUODENOSCOPY (EGD) WITH PROPOFOL;  Surgeon: [name], MD;  [facility];  Service: Endoscopy;  Laterality: N/A;  12:45  . HERNIA REPAIR  09/17/11  . LIGAMENT REPAIR Left 02/10/2018   Procedure: Left  ANKLE ATFL Repair and Ligament Augmentation, Injection of Tendon Splint Application;  Surgeon: [name], DPM;  Location: [facility];  Service: Podiatry;  Laterality: Left;  . [place]  2009   Preformed on the right shoulder  . OVARIAN CYST REMOVAL     right side  . PARTIAL HYSTERECTOMY  2005  . POLYPECTOMY  03/20/2017   Procedure: POLYPECTOMY;  Surgeon: [name], MD;  [facility];  Service: Endoscopy;;  colon  . TENDON REPAIR Left 02/10/2018   Procedure: PERONEAL TENDON REPAIR and Synovectomy Peroneal Tendon;  Surgeon: [name], DPM;  Location: [facility];  Service: Podiatry;  Laterality: Left;  . UPPER GASTROINTESTINAL ENDOSCOPY  2008  . URETERAL STENT  PLACEMENT     Social History   Socioeconomic History  . Marital status: Divorced    Spouse name: Not on file  . Number of children: Not on file  . Years of education: Not on file  . Highest education level: Not on file  Occupational History  . Not on file  Social Needs  . Financial resource strain: Not on file  . Food insecurity:    Worry: Not on file    Inability: Not on file  . Transportation needs:    Medical: Not on file    Non-medical: Not on file  Tobacco Use  . Smoking status: Never Smoker  . Smokeless tobacco: Never Used  Substance and Sexual Activity  . Alcohol use: No  . Drug use: No  . Sexual activity: Yes    Birth control/protection: Surgical  Lifestyle  . Physical activity:    Days per week: Not on file    Minutes per session: Not on file  . Stress: Not on file  Relationships  .  Social connections:    Talks on phone: Not on file    Gets together: Not on file    Attends religious service: Not on file    Active member of club or organization: Not on file    Attends meetings of clubs or organizations: Not on file    Relationship status: Not on file  Other Topics Concern  . Not on file  Social History Narrative  . Not on file   Outpatient Encounter Medications as of 12/27/2018  Medication Sig  . acetaZOLAMIDE (DIAMOX) 250 MG tablet Take 250 mg by mouth daily.   Marland Kitchen acyclovir (ZOVIRAX) 400 MG tablet Take 400 mg by mouth 3 (three) times daily as needed (for fever blisters).   Marland Kitchen atorvastatin (LIPITOR) 20 MG tablet TAKE ONE TABLET BY MOUTH DAILY.  . chlorhexidine (PERIDEX) 0.12 % solution as needed.   . Cholecalciferol (VITAMIN D3) 2000 units capsule Take 4,000 Units by mouth daily.   . cyanocobalamin (,VITAMIN B-12,) 1000 MCG/ML injection Inject 1,000 mcg into the muscle every 30 (thirty) days.  . diazepam (VALIUM) 2 MG tablet Take 2 mg by mouth every 12 (twelve) hours as needed for anxiety.   . dicyclomine (BENTYL) 20 MG tablet Take 1 tablet (20 mg total) by mouth 3 (three) times daily.  . DULoxetine (CYMBALTA) 60 MG capsule Take 60 mg by mouth daily.  Marland Kitchen estradiol (ESTRACE) 0.5 MG tablet Take 0.5 mg by mouth every evening.   . fluticasone (FLONASE) 50 MCG/ACT nasal spray Place 2 sprays into both nostrils every morning.   Marland Kitchen glucose blood (FREESTYLE LITE) test strip Use as instructed  . HYDROcodone-acetaminophen (NORCO/VICODIN) 5-325 MG tablet Take 1 tablet by mouth every 6 (six) hours as needed for severe pain.  . Influenza Vac Subunit Quad (FLUCELVAX QUADRIVALENT) 0.5 ML SUSY Flucelvax Quad 2019-2020 (PF) 60 mcg (15 mcg x 4)/0.5 mL IM syringe  INJECT 0.5ML INTRAMUSCULARLY ONCE.  Marland Kitchen levothyroxine (SYNTHROID, LEVOTHROID) 75 MCG tablet TAKE ONE TABLET BY MOUTH DAILY BEFORE BREAKFAST  . metoprolol (LOPRESSOR) 50 MG tablet Take 25 mg by mouth daily.   . ondansetron (ZOFRAN)  4 MG tablet Take 1 tablet (4 mg total) by mouth every 8 (eight) hours as needed for nausea or vomiting.  . pantoprazole (PROTONIX) 40 MG tablet 40 mg daily.   Marland Kitchen PARoxetine (PAXIL) 40 MG tablet Take 20 mg by mouth daily.   Vladimir Faster Glycol-Propyl Glycol (SYSTANE OP) Place 1 drop into both eyes  daily.  Marland Kitchen PRODIGY TWIST TOP LANCETS 28G MISC TEST TWICE DAILY  . promethazine (PHENERGAN) 25 MG tablet Take 25 mg by mouth every 6 (six) hours as needed for nausea. Only as needed  . tiZANidine (ZANAFLEX) 4 MG tablet Take 4 mg by mouth. Patient states that she takes as needed  . TRESIBA FLEXTOUCH 200 UNIT/ML SOPN Inject 74 Units into the skin at bedtime.  . Turmeric 500 MG CAPS Take 500 mg by mouth daily.   . [DISCONTINUED] glyBURIDE-metformin (GLUCOVANCE) 5-500 MG tablet Take 1 tablet by mouth daily with breakfast.   . [DISCONTINUED] metFORMIN (GLUCOPHAGE) 500 MG tablet Take 1 tablet (500 mg total) by mouth 2 (two) times daily with a meal.  . [DISCONTINUED] TRESIBA FLEXTOUCH 200 UNIT/ML SOPN INJECT 66 UNITS INTO THE SKIN AT BEDTIME. (Patient taking differently: Inject 70 Units into the skin at bedtime. )   No facility-administered encounter medications on file as of 12/27/2018.    ALLERGIES: Allergies  Allergen Reactions  . Flagyl [Metronidazole Hcl] Itching and Rash  . Nsaids Nausea Only   VACCINATION STATUS: Immunization History  Administered Date(s) Administered  . Influenza Inj Mdck Quad Pf 08/07/2017, 08/11/2018    Diabetes  She presents for her follow-up diabetic visit. She has type 2 diabetes mellitus. Onset time: She was diagnosed approximately at age 34 years. Her disease course has been worsening.  She reports morning glycemic range between 96-206, prelunch between 90-269 , presupper 98- 240,  bedtime between 98- 326.   Her previsit A1c is 12.4%, increasing from 9.7%.  She does not report any  hypoglycemic episodes.  She is currently on Tresiba 80 units nightly, metformin 500 mg p.o.  twice daily.  He was initiated on NovoLog 10 units 3 times daily AC during her last visit.     She is following a generally unhealthy diet. When asked about meal planning, she reported none. She has not had a previous visit with a dietitian. She never participates in exercise.   For her hypothyroidism, she is on levothyroxine 75 mcg p.o. nightly.  She reports compliance.  She has no new complaints.   Hyperlipidemia  This is a chronic problem. The current episode started more than 1 year ago. The problem is controlled. Exacerbating diseases include diabetes and obesity. Pertinent negatives include no chest pain, myalgias or shortness of breath. Current antihyperlipidemic treatment includes statins. Risk factors for coronary artery disease include diabetes mellitus, dyslipidemia, obesity, a sedentary lifestyle and post-menopausal.     Objective:     Recent Results (from the past 2160 hour(s))  COMPLETE METABOLIC PANEL WITH GFR     Status: Abnormal   Collection Time: 04/12/19  2:10 PM  Result Value Ref Range   Glucose, Bld 128 65 - 139 mg/dL    Comment: .        Non-fasting reference interval .    BUN 13 7 - 25 mg/dL   Creat 0.87 0.50 - 1.05 mg/dL    Comment: For patients >105 years of age, the reference limit for Creatinine is approximately 13% higher for people identified as African-American. .    GFR, Est Non African American 74 > OR = 60 mL/min/1.65m   GFR, Est African American 86 > OR = 60 mL/min/1.767m  BUN/Creatinine Ratio NOT APPLICABLE 6 - 22 (calc)   Sodium 137 135 - 146 mmol/L   Potassium 4.0 3.5 - 5.3 mmol/L   Chloride 102 98 - 110 mmol/L   CO2 26 20 - 32 mmol/L  Calcium 9.4 8.6 - 10.4 mg/dL   Total Protein 7.5 6.1 - 8.1 g/dL   Albumin 3.9 3.6 - 5.1 g/dL   Globulin 3.6 1.9 - 3.7 g/dL (calc)   AG Ratio 1.1 1.0 - 2.5 (calc)   Total Bilirubin 0.5 0.2 - 1.2 mg/dL   Alkaline phosphatase (APISO) 155 (H) 37 - 153 U/L   AST 50 (H) 10 - 35 U/L   ALT 29 6 - 29 U/L   Hemoglobin A1c     Status: Abnormal   Collection Time: 04/12/19  2:10 PM  Result Value Ref Range   Hgb A1c MFr Bld 12.4 (H) <5.7 % of total Hgb    Comment: For someone without known diabetes, a hemoglobin A1c value of 6.5% or greater indicates that they may have  diabetes and this should be confirmed with a follow-up  test. . For someone with known diabetes, a value <7% indicates  that their diabetes is well controlled and a value  greater than or equal to 7% indicates suboptimal  control. A1c targets should be individualized based on  duration of diabetes, age, comorbid conditions, and  other considerations. . Currently, no consensus exists regarding use of hemoglobin A1c for diagnosis of diabetes for children. .    Mean Plasma Glucose 309 (calc)   eAG (mmol/L) 17.1 (calc)      Lipid Panel     Component Value Date/Time   CHOL 146 10/13/2017 0816   TRIG 90 10/13/2017 0816   HDL 49 (L) 10/13/2017 0816   CHOLHDL 3.0 10/13/2017 0816   VLDL 29 06/30/2016 1251   LDLCALC 80 10/13/2017 0816     Assessment & Plan:   1. Uncontrolled type 2 diabetes mellitus with complication, with long-term current use of insulin -This is a telephone visit due to the coronavirus pandemic. - Patient has currently uncontrolled symptomatic type 2 DM since  58 years of age. -Her previsit labs show A1c of 12.4% increasing from 9.7%.    Recent labs reviewed.   Her diabetes is complicated by obesity, insulin resistance, and patient remains at a high risk for more acute and chronic complications of diabetes which include CAD, CVA, CKD, retinopathy, and neuropathy. These are all discussed in detail with the patient.  - I have counseled the patient on diet management and weight loss, by adopting a carbohydrate restricted/protein rich diet.   - she  admits there is a room for improvement in her diet and drink choices. -  Suggestion is made for her to avoid simple carbohydrates  from her diet  including Cakes, Sweet Desserts / Pastries, Ice Cream, Soda (diet and regular), Sweet Tea, Candies, Chips, Cookies, Sweet Pastries,  Store Bought Juices, Alcohol in Excess of  1-2 drinks a day, Artificial Sweeteners, Coffee Creamer, and "Sugar-free" Products. This will help patient to have stable blood glucose profile and potentially avoid unintended weight gain.    - I encouraged the patient to switch to  unprocessed or minimally processed complex starch and increased protein intake (animal or plant source), fruits, and vegetables.  - Patient is advised to stick to a routine mealtimes to eat 3 meals  a day and avoid unnecessary snacks ( to snack only to correct hypoglycemia).  - She declines referral to [facility] for now.  - I have approached patient with the following individualized plan to manage diabetes and patient agrees:   -Based on her reports of glycemic profile, she will tolerate a higher dose of  Insulin-both basal and prandial.   -  She is advised to increase her Tresiba to 90 units nightly, he is advised to increase her NovoLog to 15 units 3 times daily AC for pre-meal glucose readings greater than rebound.  Associated with monitoring of blood glucose 4 times a day before meals and at bedtime.   -Patient is encouraged to call clinic for blood glucose levels less than 70 or above 200 mg /dl. -Her renal function is back to normal.  She is advised to continue metformin 500 mg p.o. twice daily after breakfast and supper,  therapeutically suitable for patient.  -  She has history of intolerance for [place], likely would not tolerate other  incretin therapy.  - Patient specific target  A1c;  LDL, HDL, Triglycerides, and  Waist Circumference were discussed in detail.  2) Lipids/HPL: Her recent lab showed controlled LDL of 80, generally improving from 141.  She is advised to continue atorvastatin 20 mg 1 nightly.    Side effects and precautions discussed with her.  3) hypothyroidism:  -She is  advised to continue levothyroxine 75 mcg p.o. every morning.  - We discussed about the correct intake of her thyroid hormone, on empty stomach at fasting, with water, separated by at least 30 minutes from breakfast and other medications,  and separated by more than 4 hours from calcium, iron, multivitamins, acid reflux medications (PPIs). -Patient is made aware of the fact that thyroid hormone replacement is needed for life, dose to be adjusted by periodic monitoring of thyroid function tests.  - I advised patient to maintain close follow up with Glenda Chroman, MD for primary care needs.  - Patient Care Time Today:  25 min, of which >50% was spent in reviewing her  current and  previous labs/studies, previous treatments, and medications doses and developing a plan for long-term care based on the latest recommendations for standards of care.  [name] participated in the discussions, expressed understanding, and voiced agreement with the above plans.  All questions were answered to her satisfaction. she is encouraged to contact clinic should she have any questions or concerns prior to her return visit.  Follow up plan: She is advised to return in 3 months with repeat labs and meter/logs.  Glade Lloyd, MD Phone: [phone]  Fax: [phone]  This note was partially dictated with voice recognition software. Similar sounding words can be transcribed inadequately or may not  be corrected upon review.  12/27/2018, 1:59 PM

## 2019-04-28 ENCOUNTER — Other Ambulatory Visit: Payer: Self-pay

## 2019-04-28 MED ORDER — NOVOLOG FLEXPEN 100 UNIT/ML ~~LOC~~ SOPN: 15.0000 [IU] | PEN_INJECTOR | Freq: Three times a day (TID) | SUBCUTANEOUS | 2 refills | Status: DC

## 2019-04-29 DIAGNOSIS — E119 Type 2 diabetes mellitus without complications: Secondary | ICD-10-CM | POA: Diagnosis not present

## 2019-04-29 DIAGNOSIS — M159 Polyosteoarthritis, unspecified: Secondary | ICD-10-CM | POA: Diagnosis not present

## 2019-04-29 DIAGNOSIS — I10 Essential (primary) hypertension: Secondary | ICD-10-CM | POA: Diagnosis not present

## 2019-05-03 ENCOUNTER — Other Ambulatory Visit: Payer: Self-pay | Admitting: "Endocrinology

## 2019-05-03 DIAGNOSIS — Z79899 Other long term (current) drug therapy: Secondary | ICD-10-CM | POA: Diagnosis not present

## 2019-05-03 DIAGNOSIS — Z299 Encounter for prophylactic measures, unspecified: Secondary | ICD-10-CM | POA: Diagnosis not present

## 2019-05-03 DIAGNOSIS — Z713 Dietary counseling and surveillance: Secondary | ICD-10-CM | POA: Diagnosis not present

## 2019-05-03 DIAGNOSIS — Z6831 Body mass index (BMI) 31.0-31.9, adult: Secondary | ICD-10-CM | POA: Diagnosis not present

## 2019-05-03 DIAGNOSIS — M1710 Unilateral primary osteoarthritis, unspecified knee: Secondary | ICD-10-CM | POA: Diagnosis not present

## 2019-05-05 DIAGNOSIS — M25561 Pain in right knee: Secondary | ICD-10-CM | POA: Diagnosis not present

## 2019-05-05 DIAGNOSIS — Z87828 Personal history of other (healed) physical injury and trauma: Secondary | ICD-10-CM | POA: Diagnosis not present

## 2019-05-09 ENCOUNTER — Other Ambulatory Visit: Payer: Self-pay | Admitting: "Endocrinology

## 2019-05-09 ENCOUNTER — Telehealth: Payer: Self-pay | Admitting: "Endocrinology

## 2019-05-09 MED ORDER — FREESTYLE LIBRE 14 DAY SENSOR MISC: 1.0000 | 2 refills | Status: AC

## 2019-05-09 MED ORDER — FREESTYLE LIBRE 14 DAY READER DEVI: 1.0000 | Freq: Once | 0 refills | Status: AC

## 2019-05-09 NOTE — Telephone Encounter (Signed)
Her insurance previously declined it, will sent a rx again. She has to keep logs and mail in at least 2 weeks worth of glucose readings and insulin administraton Records.

## 2019-05-09 NOTE — Telephone Encounter (Signed)
Dr. Dorris Fetch Please advise, [name] is asking for a [employer]

## 2019-05-09 NOTE — Telephone Encounter (Signed)
Patient left VM that she is interested in checking the status of the insulin that she can place on her arm. Please advise.

## 2019-05-10 DIAGNOSIS — M1710 Unilateral primary osteoarthritis, unspecified knee: Secondary | ICD-10-CM | POA: Diagnosis not present

## 2019-05-10 DIAGNOSIS — E1165 Type 2 diabetes mellitus with hyperglycemia: Secondary | ICD-10-CM | POA: Diagnosis not present

## 2019-05-10 DIAGNOSIS — Z6832 Body mass index (BMI) 32.0-32.9, adult: Secondary | ICD-10-CM | POA: Diagnosis not present

## 2019-05-10 DIAGNOSIS — M25562 Pain in left knee: Secondary | ICD-10-CM | POA: Diagnosis not present

## 2019-05-10 DIAGNOSIS — I10 Essential (primary) hypertension: Secondary | ICD-10-CM | POA: Diagnosis not present

## 2019-05-10 DIAGNOSIS — E1142 Type 2 diabetes mellitus with diabetic polyneuropathy: Secondary | ICD-10-CM | POA: Diagnosis not present

## 2019-05-10 DIAGNOSIS — Z299 Encounter for prophylactic measures, unspecified: Secondary | ICD-10-CM | POA: Diagnosis not present

## 2019-05-10 DIAGNOSIS — M1712 Unilateral primary osteoarthritis, left knee: Secondary | ICD-10-CM | POA: Diagnosis not present

## 2019-05-10 NOTE — Telephone Encounter (Signed)
Left patient a voicemail for a returned call

## 2019-05-11 ENCOUNTER — Telehealth: Payer: Self-pay | Admitting: Podiatry

## 2019-05-11 NOTE — Telephone Encounter (Signed)
Spoke with office and they are checking to see about diabetic shoe paperwork for pt. I made them aware they are going to cxl the order if we don't get the paperwork back.

## 2019-05-12 ENCOUNTER — Telehealth: Payer: Self-pay | Admitting: Podiatry

## 2019-05-12 NOTE — Telephone Encounter (Signed)
Pt notified that a new prescription was sent to [facility] and we are awaiting the forms to fill out for this.

## 2019-05-12 NOTE — Telephone Encounter (Signed)
Patient would like a call from you

## 2019-05-12 NOTE — Telephone Encounter (Signed)
Pt left message checking status of diabetic shoes.  I returned call and left message for pt that we have not received paperwork back and I spoke to Dr Liliane Channel office yesterday and they were checking to see if they could get paperwork sent back. I explained pts order is going to be cxled if [employer] does not get paperwork soon.

## 2019-05-15 ENCOUNTER — Other Ambulatory Visit: Payer: Medicare Other

## 2019-05-18 ENCOUNTER — Other Ambulatory Visit: Payer: Self-pay | Admitting: Nurse Practitioner

## 2019-05-18 ENCOUNTER — Telehealth: Payer: Self-pay | Admitting: Podiatry

## 2019-05-18 DIAGNOSIS — M545 Low back pain, unspecified: Secondary | ICD-10-CM

## 2019-05-18 DIAGNOSIS — G8929 Other chronic pain: Secondary | ICD-10-CM

## 2019-05-18 MED ORDER — TRAMADOL HCL 50 MG PO TABS: 50.0000 mg | ORAL_TABLET | Freq: Four times a day (QID) | ORAL | 0 refills | Status: AC | PRN

## 2019-05-18 NOTE — Telephone Encounter (Signed)
I called pt complains of stabbing pain and has a lot of pressure when stepping down, and the ankle is swollen. I told pt I would inform Dr. March Rummage and to call pharmacy later today.

## 2019-05-18 NOTE — Telephone Encounter (Signed)
Patient called requesting a refill on hydrocodone. Pt is schedule for an MRI on 06/13/2019. Please give patient a call once prescription has been sent.  Pharmacy is [facility]

## 2019-05-18 NOTE — Addendum Note (Signed)
Addended by: Hardie Pulley on: 05/18/2019 02:05 PM   Modules accepted: Orders

## 2019-05-18 NOTE — Telephone Encounter (Signed)
Left message informing pt of Dr. Eleanora Neighbor tramadol order.

## 2019-05-18 NOTE — Telephone Encounter (Signed)
Left message informing pt Dr. March Rummage would like to know the current status of the discomfort she is having in her foot.

## 2019-05-18 NOTE — Telephone Encounter (Signed)
Pt returning your call about the current status of pain in her foot.

## 2019-05-19 ENCOUNTER — Ambulatory Visit: Payer: Medicare Other | Admitting: Podiatry

## 2019-05-19 ENCOUNTER — Telehealth: Payer: Self-pay | Admitting: Podiatry

## 2019-05-19 NOTE — Telephone Encounter (Signed)
Pt left message yesterday asking if Dr Dorris Fetch had sent diabetic shoe paperwork.  I returned call and left message that I checked the website and we have not gotten the diabetic shoe paperwork from Dr Dorris Fetch and to have them fax directly to my office @ [phone].Marland Kitchen

## 2019-05-25 ENCOUNTER — Ambulatory Visit
Admission: RE | Admit: 2019-05-25 | Discharge: 2019-05-25 | Disposition: A | Discharge status: Home / Self Care | Payer: Medicare Other | Source: Ambulatory Visit | Attending: Nurse Practitioner | Admitting: Nurse Practitioner

## 2019-05-25 ENCOUNTER — Other Ambulatory Visit: Payer: Self-pay

## 2019-05-25 DIAGNOSIS — M5416 Radiculopathy, lumbar region: Secondary | ICD-10-CM | POA: Diagnosis not present

## 2019-05-25 DIAGNOSIS — M545 Low back pain, unspecified: Secondary | ICD-10-CM

## 2019-05-25 DIAGNOSIS — G8929 Other chronic pain: Secondary | ICD-10-CM

## 2019-05-25 MED ORDER — METHYLPREDNISOLONE ACETATE 40 MG/ML INJ SUSP (RADIOLOG
120.0000 mg | Freq: Once | INTRAMUSCULAR | Status: AC
  Administered 2019-05-25: 120 mg via EPIDURAL

## 2019-05-25 MED ORDER — IOPAMIDOL (ISOVUE-M 200) INJECTION 41%
1.0000 mL | Freq: Once | INTRAMUSCULAR | Status: AC
  Administered 2019-05-25: 10:00:00 1 mL via EPIDURAL

## 2019-05-26 ENCOUNTER — Telehealth: Payer: Self-pay | Admitting: "Endocrinology

## 2019-05-26 NOTE — Telephone Encounter (Signed)
Tyler with [facility] [employer] called and said that he needs her most recent office visit notes and medical of necessity for her [name]. He will fax something.

## 2019-05-27 ENCOUNTER — Encounter (HOSPITAL_COMMUNITY): Payer: Self-pay | Admitting: Emergency Medicine

## 2019-05-27 ENCOUNTER — Other Ambulatory Visit: Payer: Self-pay

## 2019-05-27 DIAGNOSIS — Z794 Long term (current) use of insulin: Secondary | ICD-10-CM | POA: Insufficient documentation

## 2019-05-27 DIAGNOSIS — E039 Hypothyroidism, unspecified: Secondary | ICD-10-CM | POA: Insufficient documentation

## 2019-05-27 DIAGNOSIS — R112 Nausea with vomiting, unspecified: Secondary | ICD-10-CM | POA: Insufficient documentation

## 2019-05-27 DIAGNOSIS — E1140 Type 2 diabetes mellitus with diabetic neuropathy, unspecified: Secondary | ICD-10-CM | POA: Insufficient documentation

## 2019-05-27 DIAGNOSIS — R945 Abnormal results of liver function studies: Secondary | ICD-10-CM | POA: Diagnosis not present

## 2019-05-27 DIAGNOSIS — Z79899 Other long term (current) drug therapy: Secondary | ICD-10-CM | POA: Diagnosis not present

## 2019-05-27 DIAGNOSIS — Z1339 Encounter for screening examination for other mental health and behavioral disorders: Secondary | ICD-10-CM | POA: Diagnosis not present

## 2019-05-27 DIAGNOSIS — E119 Type 2 diabetes mellitus without complications: Secondary | ICD-10-CM | POA: Insufficient documentation

## 2019-05-27 DIAGNOSIS — I10 Essential (primary) hypertension: Secondary | ICD-10-CM | POA: Insufficient documentation

## 2019-05-27 DIAGNOSIS — R197 Diarrhea, unspecified: Secondary | ICD-10-CM | POA: Diagnosis not present

## 2019-05-27 DIAGNOSIS — Z7189 Other specified counseling: Secondary | ICD-10-CM | POA: Diagnosis not present

## 2019-05-27 DIAGNOSIS — E876 Hypokalemia: Secondary | ICD-10-CM | POA: Insufficient documentation

## 2019-05-27 DIAGNOSIS — R51 Headache: Secondary | ICD-10-CM | POA: Insufficient documentation

## 2019-05-27 DIAGNOSIS — N390 Urinary tract infection, site not specified: Secondary | ICD-10-CM | POA: Insufficient documentation

## 2019-05-27 DIAGNOSIS — Z1211 Encounter for screening for malignant neoplasm of colon: Secondary | ICD-10-CM | POA: Diagnosis not present

## 2019-05-27 DIAGNOSIS — Z6834 Body mass index (BMI) 34.0-34.9, adult: Secondary | ICD-10-CM | POA: Diagnosis not present

## 2019-05-27 DIAGNOSIS — Z1331 Encounter for screening for depression: Secondary | ICD-10-CM | POA: Diagnosis not present

## 2019-05-27 DIAGNOSIS — Z299 Encounter for prophylactic measures, unspecified: Secondary | ICD-10-CM | POA: Diagnosis not present

## 2019-05-27 DIAGNOSIS — Z0000 Encounter for general adult medical examination without abnormal findings: Secondary | ICD-10-CM | POA: Diagnosis not present

## 2019-05-27 LAB — URINALYSIS, ROUTINE W REFLEX MICROSCOPIC
Bilirubin Urine: NEGATIVE
Glucose, UA: NEGATIVE mg/dL
Hgb urine dipstick: NEGATIVE
Ketones, ur: NEGATIVE mg/dL
Leukocytes,Ua: NEGATIVE
Nitrite: POSITIVE — AB
Protein, ur: NEGATIVE mg/dL
Specific Gravity, Urine: 1.005 (ref 1.005–1.030)
pH: 6 (ref 5.0–8.0)

## 2019-05-27 LAB — CBC
HCT: 44.3 % (ref 36.0–46.0)
Hemoglobin: 14.5 g/dL (ref 12.0–15.0)
MCH: 30.2 pg (ref 26.0–34.0)
MCHC: 32.7 g/dL (ref 30.0–36.0)
MCV: 92.3 fL (ref 80.0–100.0)
Platelets: 266 10*3/uL (ref 150–400)
RBC: 4.8 MIL/uL (ref 3.87–5.11)
RDW: 14.3 % (ref 11.5–15.5)
WBC: 17.1 10*3/uL — ABNORMAL HIGH (ref 4.0–10.5)
nRBC: 0 % (ref 0.0–0.2)

## 2019-05-27 LAB — LIPASE, BLOOD: Lipase: 22 U/L (ref 11–51)

## 2019-05-27 LAB — COMPREHENSIVE METABOLIC PANEL
ALT: 48 U/L — ABNORMAL HIGH (ref 0–44)
AST: 53 U/L — ABNORMAL HIGH (ref 15–41)
Albumin: 4 g/dL (ref 3.5–5.0)
Alkaline Phosphatase: 133 U/L — ABNORMAL HIGH (ref 38–126)
Anion gap: 10 (ref 5–15)
BUN: 23 mg/dL — ABNORMAL HIGH (ref 6–20)
CO2: 27 mmol/L (ref 22–32)
Calcium: 10 mg/dL (ref 8.9–10.3)
Chloride: 103 mmol/L (ref 98–111)
Creatinine, Ser: 0.84 mg/dL (ref 0.44–1.00)
GFR calc Af Amer: >60 mL/min (ref >60)
GFR calc non Af Amer: >60 mL/min (ref >60)
Glucose, Bld: 83 mg/dL (ref 70–99)
Potassium: 3.3 mmol/L — ABNORMAL LOW (ref 3.5–5.1)
Sodium: 140 mmol/L (ref 135–145)
Total Bilirubin: 0.9 mg/dL (ref 0.3–1.2)
Total Protein: 8.4 g/dL — ABNORMAL HIGH (ref 6.5–8.1)

## 2019-05-27 LAB — CBG MONITORING, ED: Glucose-Capillary: 95 mg/dL (ref 70–99)

## 2019-05-27 NOTE — ED Triage Notes (Signed)
Pt with multiple complaints: blood glucose @ home was 90, pt has diarrhea, blood pressure @ home was 154/100, and pt is having L sided flank pain and has been vomiting.

## 2019-05-28 ENCOUNTER — Emergency Department (HOSPITAL_COMMUNITY)
Admission: EM | Admit: 2019-05-28 | Discharge: 2019-05-28 | Disposition: A | Discharge status: Home / Self Care | Payer: Medicare Other | Attending: Emergency Medicine | Admitting: Emergency Medicine

## 2019-05-28 DIAGNOSIS — E876 Hypokalemia: Secondary | ICD-10-CM

## 2019-05-28 DIAGNOSIS — R51 Headache: Secondary | ICD-10-CM | POA: Diagnosis not present

## 2019-05-28 DIAGNOSIS — R112 Nausea with vomiting, unspecified: Secondary | ICD-10-CM

## 2019-05-28 DIAGNOSIS — N390 Urinary tract infection, site not specified: Secondary | ICD-10-CM

## 2019-05-28 DIAGNOSIS — R7989 Other specified abnormal findings of blood chemistry: Secondary | ICD-10-CM

## 2019-05-28 DIAGNOSIS — R519 Headache, unspecified: Secondary | ICD-10-CM

## 2019-05-28 DIAGNOSIS — R197 Diarrhea, unspecified: Secondary | ICD-10-CM

## 2019-05-28 LAB — CBG MONITORING, ED: Glucose-Capillary: 95 mg/dL (ref 70–99)

## 2019-05-28 MED ORDER — SODIUM CHLORIDE 0.9 % IV BOLUS
1000.0000 mL | Freq: Once | INTRAVENOUS | Status: AC
  Administered 2019-05-28: 1000 mL via INTRAVENOUS

## 2019-05-28 MED ORDER — LOPERAMIDE HCL 2 MG PO CAPS
4.0000 mg | ORAL_CAPSULE | Freq: Once | ORAL | Status: AC
  Administered 2019-05-28: 4 mg via ORAL
  Filled 2019-05-28: qty 2

## 2019-05-28 MED ORDER — CEPHALEXIN 500 MG PO CAPS: 500.0000 mg | ORAL_CAPSULE | Freq: Four times a day (QID) | ORAL | 0 refills | Status: DC

## 2019-05-28 MED ORDER — METOCLOPRAMIDE HCL 5 MG/ML IJ SOLN
10.0000 mg | Freq: Once | INTRAMUSCULAR | Status: AC
  Administered 2019-05-28: 04:00:00 10 mg via INTRAVENOUS
  Filled 2019-05-28: qty 2

## 2019-05-28 MED ORDER — DIPHENHYDRAMINE HCL 50 MG/ML IJ SOLN
25.0000 mg | Freq: Once | INTRAMUSCULAR | Status: AC
  Administered 2019-05-28: 25 mg via INTRAVENOUS
  Filled 2019-05-28: qty 1

## 2019-05-28 MED ORDER — METOCLOPRAMIDE HCL 10 MG PO TABS: 10.0000 mg | ORAL_TABLET | Freq: Four times a day (QID) | ORAL | 0 refills | Status: DC | PRN

## 2019-05-28 MED ORDER — SODIUM CHLORIDE 0.9 % IV SOLN
1.0000 g | Freq: Once | INTRAVENOUS | Status: AC
  Administered 2019-05-28: 1 g via INTRAVENOUS
  Filled 2019-05-28: qty 10

## 2019-05-28 NOTE — ED Provider Notes (Signed)
[facility] EMERGENCY DEPARTMENT Provider Note   CSN: [id number] Arrival date & time: 05/27/19  1858    History   Chief Complaint Chief Complaint  Patient presents with  . Hypoglycemia    HPI [name] is a 58 y.o. female.   The history is provided by the patient.  She has history of hypertension, diabetes, hyperlipidemia, anxiety disorder and comes in with multiple complaints.  She states that when she woke up this morning, she had a bifrontal headache and there was nausea and vomiting.  She checked her blood sugar and initially it was elevated to 165, but has gone down to 90 as the day has gone on.  Her blood pressure was elevated to 155/110.  She also started having diarrhea.  She thinks she has vomited twice and had to diarrhea bowel movements.  She denies fever, chills, sweats.  She denies abdominal pain but has had some left flank pain.  Headache has persisted through the day.  She describes a throbbing pain which she rates at 7/10 at its worst.  Acetaminophen did give some partial, temporary relief of her headache.  She denies any sick contacts.  She denies any cough or change in sense of smell or taste.  She denies exposure to COVID-19.  Past Medical History:  Diagnosis Date  . Anxiety disorder   . Chronic low back pain   . Cirrhosis of liver ([place])   . Colitis   . Diabetes mellitus    Type II  . Diabetic neuropathy ([place])   . Diverticulitis   . Fatty liver   . GERD (gastroesophageal reflux disease)   . High cholesterol   . Hypertension   . Irritable bowel syndrome   . Meniere's disease   . Neuropathy   . Pain     Patient Active Problem List   Diagnosis Date Noted  . Hypothyroidism 09/24/2018  . Sprain of anterior talofibular ligament of left ankle   . Ankle instability, left   . Synovitis and tenosynovitis   . Tear of peroneal tendon   . Peroneal tendinitis of left lower extremity   . Uncontrolled type 2 diabetes mellitus with complication, with long-term  current use of insulin ([place]) 01/12/2018  . Mixed hyperlipidemia 05/14/2017  . Gastroesophageal reflux disease without esophagitis 02/11/2017  . History of colonic polyps 02/11/2017  . Nausea without vomiting 02/11/2017  . Irritable bowel syndrome 02/11/2017  . Hepatic cirrhosis ([place]) 02/11/2017  . Pain in left foot 01/22/2017  . Pain in right foot 01/22/2017  . Metatarsal stress fracture of left foot 11/06/2016  . Diabetic polyneuropathy associated with type 2 diabetes mellitus ([place]) 11/06/2016  . Anxiety disorder 09/19/2016  . Reaction to severe stress 09/19/2016  . Hypercholesteremia 07/15/2016  . Class 1 obesity due to excess calories with serious comorbidity and body mass index (BMI) of 33.0 to 33.9 in adult 04/29/2016  . Nausea and vomiting 01/17/2014  . Essential hypertension 10/25/2013  . Fatty liver disease, nonalcoholic 75/64/3329  . IBS (irritable bowel syndrome) 02/24/2012  . Type 2 diabetes mellitus, uncontrolled ([place]) 02/24/2012  . Meniere's disease 12/09/2011    Past Surgical History:  Procedure Laterality Date  . ABDOMINAL HYSTERECTOMY    . BIOPSY  03/20/2017   Procedure: BIOPSY;  Surgeon: [name], MD;  [facility];  Service: Endoscopy;;  colon gastric  . bladder tact  2005  . CHOLECYSTECTOMY  08/11  . COLONOSCOPY  2208  . COLONOSCOPY  In of September 2014   @  [facility]/Dr,.[name]  . COLONOSCOPY WITH PROPOFOL N/A 03/20/2017   Procedure: COLONOSCOPY WITH PROPOFOL;  Surgeon: [name], MD;  [facility];  Service: Endoscopy;  Laterality: N/A;  . ECTOPIC PREGNANCY SURGERY  1996  . ESOPHAGOGASTRODUODENOSCOPY (EGD) WITH PROPOFOL N/A 03/20/2017   Procedure: ESOPHAGOGASTRODUODENOSCOPY (EGD) WITH PROPOFOL;  Surgeon: [name], MD;  [facility];  Service: Endoscopy;  Laterality: N/A;  12:45  . HERNIA REPAIR  09/17/11  . LIGAMENT REPAIR Left 02/10/2018   Procedure: Left  ANKLE ATFL Repair and Ligament Augmentation, Injection  of Tendon Splint Application;  Surgeon: [name], DPM;  Location: [place];  Service: Podiatry;  Laterality: Left;  . [place]  2009   Preformed on the right shoulder  . OVARIAN CYST REMOVAL     right side  . PARTIAL HYSTERECTOMY  2005  . POLYPECTOMY  03/20/2017   Procedure: POLYPECTOMY;  Surgeon: [name], MD;  [facility];  Service: Endoscopy;;  colon  . TENDON REPAIR Left 02/10/2018   Procedure: PERONEAL TENDON REPAIR and Synovectomy Peroneal Tendon;  Surgeon: [name], DPM;  Location: [place];  Service: Podiatry;  Laterality: Left;  . UPPER GASTROINTESTINAL ENDOSCOPY  2008  . URETERAL STENT PLACEMENT       OB History   No obstetric history on file.      Home Medications    Prior to Admission medications   Medication Sig Start Date End Date Taking? Authorizing Provider  acetaZOLAMIDE (DIAMOX) 250 MG tablet Take 250 mg by mouth daily.  04/28/16   [provider]  acyclovir (ZOVIRAX) 400 MG tablet Take 400 mg by mouth 3 (three) times daily as needed (for fever blisters).  06/25/16   [provider]  atorvastatin (LIPITOR) 20 MG tablet TAKE ONE TABLET BY MOUTH DAILY. 05/04/19   Cassandria Anger, MD  chlorhexidine (PERIDEX) 0.12 % solution as needed.     [provider]  Cholecalciferol (VITAMIN D3) 2000 units capsule Take 4,000 Units by mouth daily.     [provider]  Continuous Blood Gluc Sensor (FREESTYLE LIBRE 14 DAY SENSOR) MISC Inject 1 each into the skin every 14 (fourteen) days. Use as directed. 05/09/19   Cassandria Anger, MD  cyanocobalamin (,VITAMIN B-12,) 1000 MCG/ML injection Inject 1,000 mcg into the muscle every 30 (thirty) days. 04/14/16   [provider]  diazepam (VALIUM) 2 MG tablet Take 2 mg by mouth every 12 (twelve) hours as needed for anxiety.  04/07/16   [provider]  dicyclomine (BENTYL) 20 MG tablet Take 1 tablet (20 mg total) by mouth 3 (three) times daily. 11/30/18    Rehman, Mechele Dawley, MD  DULoxetine (CYMBALTA) 60 MG capsule Take 60 mg by mouth daily. 04/28/16   [provider]  estradiol (ESTRACE) 0.5 MG tablet Take 0.5 mg by mouth every evening.  01/27/17   [provider]  fluconazole (DIFLUCAN) 150 MG tablet  02/15/19   [provider]  fluticasone (FLONASE) 50 MCG/ACT nasal spray Place 2 sprays into both nostrils every morning.     [provider]  glucose blood (FREESTYLE LITE) test strip Use as instructed 05/28/17   Cassandria Anger, MD  hydrochlorothiazide (HYDRODIURIL) 25 MG tablet  02/14/19   [provider]  HYDROcodone-acetaminophen (NORCO/VICODIN) 5-325 MG tablet Take 1 tablet by mouth every 6 (six) hours as needed for severe pain. 12/30/18   [name], DPM  Influenza Vac Subunit Quad (FLUCELVAX QUADRIVALENT) 0.5 ML  SUSY Flucelvax Quad 2019-2020 (PF) 60 mcg (15 mcg x 4)/0.5 mL IM syringe  INJECT 0.5ML INTRAMUSCULARLY ONCE.    [provider]  insulin aspart (NOVOLOG FLEXPEN) 100 UNIT/ML FlexPen Inject 15 Units into the skin 3 (three) times daily with meals. 04/28/19   Cassandria Anger, MD  levothyroxine (SYNTHROID) 75 MCG tablet TAKE ONE TABLET BY MOUTH DAILY BEFORE BREAKFAST 04/11/19   Cassandria Anger, MD  metFORMIN (GLUCOPHAGE) 500 MG tablet Take 1 tablet (500 mg total) by mouth 2 (two) times daily with a meal. 04/27/19   Nida, Marella Chimes, MD  metoprolol (LOPRESSOR) 50 MG tablet Take 25 mg by mouth daily.     [provider]  ondansetron (ZOFRAN) 4 MG tablet Take 1 tablet (4 mg total) by mouth every 8 (eight) hours as needed for nausea or vomiting. 09/24/16   Rolland Porter, MD  pantoprazole (PROTONIX) 40 MG tablet 40 mg daily.  01/13/17   [provider]  PARoxetine (PAXIL) 40 MG tablet Take 20 mg by mouth daily.  07/22/17   [provider]  Polyethyl Glycol-Propyl Glycol (SYSTANE OP) Place 1 drop into both eyes daily.    [provider]   PRODIGY TWIST TOP LANCETS 28G MISC TEST TWICE DAILY 06/25/17   Cassandria Anger, MD  promethazine (PHENERGAN) 25 MG tablet Take 25 mg by mouth every 6 (six) hours as needed for nausea. Only as needed    [provider]  tiZANidine (ZANAFLEX) 4 MG tablet Take 4 mg by mouth. Patient states that she takes as needed 05/27/18   [provider]  TRESIBA FLEXTOUCH 200 UNIT/ML SOPN Inject 90 Units into the skin at bedtime. 04/27/19   Cassandria Anger, MD  Turmeric 500 MG CAPS Take 500 mg by mouth daily.     [provider]    Family History Family History  Problem Relation Age of Onset  . Healthy Mother   . Heart disease Father   . Esophageal cancer Brother   . Healthy Son   . Healthy Son     Social History Social History   Tobacco Use  . Smoking status: Never Smoker  . Smokeless tobacco: Never Used  Substance Use Topics  . Alcohol use: No  . Drug use: No     Allergies   Flagyl [metronidazole hcl] and Nsaids   Review of Systems Review of Systems  All other systems reviewed and are negative.    Physical Exam Updated Vital Signs BP (!) 177/102 (BP Location: Right Arm)   Pulse (!) 110   Temp 98.2 F (36.8 C) (Oral)   Resp 18   Ht 5' 1"  (1.549 m)   Wt 81.6 kg   SpO2 99%   BMI 34.01 kg/m   Physical Exam Vitals signs and nursing note reviewed.    58 year old female, resting comfortably and in no acute distress. Vital signs are significant for elevated blood pressure and elevated heart rate. Oxygen saturation is 99%, which is normal. Head is normocephalic and atraumatic. PERRLA, EOMI. Oropharynx is clear. Neck is nontender and supple without adenopathy or JVD. Back is nontender in the midline.  There is mild to moderate left CVA tenderness. Lungs are clear without rales, wheezes, or rhonchi. Chest is nontender. Heart has regular rate and rhythm without murmur. Abdomen is soft, flat, nontender without masses or hepatosplenomegaly and  peristalsis is hypoactive. Extremities have no cyanosis or edema, full range of motion is present. Skin is warm and dry without rash.  Neurologic: Mental status is normal, cranial nerves are intact, there are no motor or sensory deficits.  ED Treatments / Results  Labs (all labs ordered are listed, but only abnormal results are displayed) Labs Reviewed  CBC - Abnormal; Notable for the following components:      Result Value   WBC 17.1 (*)    All other components within normal limits  URINALYSIS, ROUTINE W REFLEX MICROSCOPIC - Abnormal; Notable for the following components:   APPearance HAZY (*)    Nitrite POSITIVE (*)    Bacteria, UA RARE (*)    All other components within normal limits  COMPREHENSIVE METABOLIC PANEL - Abnormal; Notable for the following components:   Potassium 3.3 (*)    BUN 23 (*)    Total Protein 8.4 (*)    AST 53 (*)    ALT 48 (*)    Alkaline Phosphatase 133 (*)    All other components within normal limits  URINE CULTURE  LIPASE, BLOOD  CBG MONITORING, ED  CBG MONITORING, ED   Procedures Procedures  Medications Ordered in ED Medications  sodium chloride 0.9 % bolus 1,000 mL (1,000 mLs Intravenous New Bag/Given 05/28/19 0405)  cefTRIAXone (ROCEPHIN) 1 g in sodium chloride 0.9 % 100 mL IVPB (1 g Intravenous New Bag/Given 05/28/19 0405)  metoCLOPramide (REGLAN) injection 10 mg (10 mg Intravenous Given 05/28/19 0404)  diphenhydrAMINE (BENADRYL) injection 25 mg (25 mg Intravenous Given 05/28/19 0404)  loperamide (IMODIUM) capsule 4 mg (4 mg Oral Given 05/28/19 0335)     Initial Impression / Assessment and Plan / ED Course  I have reviewed the triage vital signs and the nursing notes.  Pertinent lab results that were available during my care of the patient were reviewed by me and considered in my medical decision making (see chart for details).  Multiple complaints that seem to be related to what is probably a viral gastroenteritis.  No red flags to suggest  more serious pathology such as bowel obstruction.  Headache seems consistent with muscle contraction headache.  No red flags to suggest serious pathology such as subarachnoid hemorrhage or meningitis.  Urinalysis does have positive nitrite but only rare bacteria and only 0-5 WBCs.  Urine will be sent for culture.  WBC is moderately elevated at 17.1.  There is mild hypokalemia which is probably related to vomiting.  Mild elevations of ALT, AST, alkaline phosphatase and not felt to be clinically significant and can be followed as an outpatient.  She will be given IV fluids as well as metoclopramide and diphenhydramine to treat her nausea and headache.  She will be given loperamide for diarrhea and will be given ceftriaxone for presumed UTI.  Old records are reviewed, and she has no relevant past ED visits, and has been followed by gastroenterology for fatty liver and IBS.  She feels much better after above-noted treatment.  She will be discharged with prescriptions for metoclopramide, cephalexin and advised use over-the-counter loperamide as needed for diarrhea.  Final Clinical Impressions(s) / ED Diagnoses   Final diagnoses:  Bad headache  Nausea vomiting and diarrhea  Hypokalemia  Elevated liver function tests  Urinary tract infection without hematuria, site unspecified    ED Discharge Orders         Ordered    cephALEXin (KEFLEX) 500 MG capsule  4 times daily     05/28/19 0518    metoCLOPramide (REGLAN) 10 MG tablet  Every 6 hours PRN     05/28/19 0518  Delora Fuel, MD 76/81/15 [phone]

## 2019-05-28 NOTE — Discharge Instructions (Signed)
Take loperamide as needed for diarrhea.  Your urine was sent for culture.  Culture results should be available in 2 days.  If culture shows you need to be on a different antibiotic, we will contact you.  Return if symptoms are getting worse.

## 2019-05-30 ENCOUNTER — Other Ambulatory Visit (INDEPENDENT_AMBULATORY_CARE_PROVIDER_SITE_OTHER): Payer: Self-pay | Admitting: *Deleted

## 2019-05-30 ENCOUNTER — Telehealth: Payer: Self-pay | Admitting: Podiatry

## 2019-05-30 DIAGNOSIS — E119 Type 2 diabetes mellitus without complications: Secondary | ICD-10-CM | POA: Diagnosis not present

## 2019-05-30 DIAGNOSIS — H2513 Age-related nuclear cataract, bilateral: Secondary | ICD-10-CM | POA: Diagnosis not present

## 2019-05-30 DIAGNOSIS — H25013 Cortical age-related cataract, bilateral: Secondary | ICD-10-CM | POA: Diagnosis not present

## 2019-05-30 DIAGNOSIS — K7460 Unspecified cirrhosis of liver: Secondary | ICD-10-CM

## 2019-05-30 DIAGNOSIS — H40013 Open angle with borderline findings, low risk, bilateral: Secondary | ICD-10-CM | POA: Diagnosis not present

## 2019-05-30 DIAGNOSIS — H2512 Age-related nuclear cataract, left eye: Secondary | ICD-10-CM | POA: Diagnosis not present

## 2019-05-30 NOTE — Telephone Encounter (Signed)
Left message with son to tell pt that we finally got all the documents signed for her to get her diabetic shoes and it usually takes about 2 wks and I will call when they come in to schedule an appt.

## 2019-05-31 ENCOUNTER — Encounter (INDEPENDENT_AMBULATORY_CARE_PROVIDER_SITE_OTHER): Payer: Self-pay | Admitting: *Deleted

## 2019-05-31 ENCOUNTER — Encounter (INDEPENDENT_AMBULATORY_CARE_PROVIDER_SITE_OTHER): Payer: Self-pay | Admitting: Internal Medicine

## 2019-05-31 ENCOUNTER — Other Ambulatory Visit: Payer: Self-pay

## 2019-05-31 ENCOUNTER — Ambulatory Visit (INDEPENDENT_AMBULATORY_CARE_PROVIDER_SITE_OTHER): Payer: Medicare Other | Admitting: Internal Medicine

## 2019-05-31 VITALS — BP 120/69 | HR 87 | Temp 98.0°F | Resp 18 | Ht 62.0 in | Wt 180.8 lb

## 2019-05-31 DIAGNOSIS — K219 Gastro-esophageal reflux disease without esophagitis: Secondary | ICD-10-CM | POA: Diagnosis not present

## 2019-05-31 DIAGNOSIS — K7460 Unspecified cirrhosis of liver: Secondary | ICD-10-CM

## 2019-05-31 DIAGNOSIS — Z79899 Other long term (current) drug therapy: Secondary | ICD-10-CM | POA: Diagnosis not present

## 2019-05-31 DIAGNOSIS — K580 Irritable bowel syndrome with diarrhea: Secondary | ICD-10-CM | POA: Diagnosis not present

## 2019-05-31 DIAGNOSIS — E7800 Pure hypercholesterolemia, unspecified: Secondary | ICD-10-CM | POA: Diagnosis not present

## 2019-05-31 DIAGNOSIS — K7581 Nonalcoholic steatohepatitis (NASH): Secondary | ICD-10-CM | POA: Diagnosis not present

## 2019-05-31 DIAGNOSIS — F419 Anxiety disorder, unspecified: Secondary | ICD-10-CM | POA: Diagnosis not present

## 2019-05-31 LAB — URINE CULTURE: Culture: >=100000 — AB

## 2019-05-31 NOTE — Patient Instructions (Signed)
Physician will call with results of ultrasound when completed. Next blood work in 4 weeks.

## 2019-06-01 ENCOUNTER — Telehealth: Payer: Self-pay | Admitting: Emergency Medicine

## 2019-06-01 NOTE — Progress Notes (Signed)
Presenting complaint;  Follow-up for cirrhosis GERD and IBS.  Database and subjective:  Patient is 58 year old Caucasian female who has cirrhosis secondary to nonalcoholic steatohepatitis with well compensated hepatic function GERD and IBS who is here for scheduled visit.  She was last seen 6 months ago. She tells me she was seen in emergency room last Friday and diagnosed with urinary tract infection and she is on cephalexin.  She was noted to have leukocytosis and her transaminases were higher than they have been.  She did not have any imaging study. She states she is doing well as far as her GI tract is concerned.  She is watching her diet and taking her PPI and rarely experiences heartburn.  She denies nausea vomiting or dysphagia.  Her bowels move daily.  She is not taking dicyclomine twice daily.  She denies melena or rectal bleeding.  She says she is busy.  She has not been exercising like she was before.  She says she has been under a lot of stress.  She is been helping to look after her grandson who is 22 years old.  Her son apparently had problems with alcohol abuse.  She feels her depression medication is working.  Current Medications: Outpatient Encounter Medications as of 05/31/2019  Medication Sig  . acetaZOLAMIDE (DIAMOX) 250 MG tablet Take 250 mg by mouth daily.   Marland Kitchen acyclovir (ZOVIRAX) 400 MG tablet Take 400 mg by mouth 3 (three) times daily as needed (for fever blisters).   Marland Kitchen atorvastatin (LIPITOR) 20 MG tablet TAKE ONE TABLET BY MOUTH DAILY.  . cephALEXin (KEFLEX) 500 MG capsule Take 1 capsule (500 mg total) by mouth 4 (four) times daily.  . chlorhexidine (PERIDEX) 0.12 % solution as needed.   . Cholecalciferol (VITAMIN D3) 2000 units capsule Take 4,000 Units by mouth daily.   . cyanocobalamin (,VITAMIN B-12,) 1000 MCG/ML injection Inject 1,000 mcg into the muscle every 30 (thirty) days.  . diazepam (VALIUM) 2 MG tablet Take 2 mg by mouth every 12 (twelve) hours as needed for  anxiety.   . dicyclomine (BENTYL) 20 MG tablet Take 1 tablet (20 mg total) by mouth 3 (three) times daily.  . DULoxetine (CYMBALTA) 60 MG capsule Take 60 mg by mouth daily.  Marland Kitchen estradiol (ESTRACE) 0.5 MG tablet Take 0.5 mg by mouth every evening.   . fluconazole (DIFLUCAN) 150 MG tablet Take 150 mg by mouth as needed.   . fluticasone (FLONASE) 50 MCG/ACT nasal spray Place 2 sprays into both nostrils every morning.   . hydrochlorothiazide (HYDRODIURIL) 25 MG tablet Take 25 mg by mouth daily.   Marland Kitchen HYDROcodone-acetaminophen (NORCO/VICODIN) 5-325 MG tablet Take 1 tablet by mouth every 6 (six) hours as needed for severe pain.  . Influenza Vac Subunit Quad (FLUCELVAX QUADRIVALENT) 0.5 ML SUSY Flucelvax Quad 2019-2020 (PF) 60 mcg (15 mcg x 4)/0.5 mL IM syringe  INJECT 0.5ML INTRAMUSCULARLY ONCE.  Marland Kitchen insulin aspart (NOVOLOG FLEXPEN) 100 UNIT/ML FlexPen Inject 15 Units into the skin 3 (three) times daily with meals.  Marland Kitchen levothyroxine (SYNTHROID) 75 MCG tablet TAKE ONE TABLET BY MOUTH DAILY BEFORE BREAKFAST  . metFORMIN (GLUCOPHAGE) 500 MG tablet Take 1 tablet (500 mg total) by mouth 2 (two) times daily with a meal.  . metoCLOPramide (REGLAN) 10 MG tablet Take 1 tablet (10 mg total) by mouth every 6 (six) hours as needed for nausea (or headache).  . metoprolol (LOPRESSOR) 50 MG tablet Take 25 mg by mouth daily.   . ondansetron (ZOFRAN) 4 MG tablet  Take 1 tablet (4 mg total) by mouth every 8 (eight) hours as needed for nausea or vomiting.  . pantoprazole (PROTONIX) 40 MG tablet 40 mg daily.   Marland Kitchen PARoxetine (PAXIL) 40 MG tablet Take 20 mg by mouth daily.   Vladimir Faster Glycol-Propyl Glycol (SYSTANE OP) Place 1 drop into both eyes daily.  Marland Kitchen PRODIGY TWIST TOP LANCETS 28G MISC TEST TWICE DAILY  . tiZANidine (ZANAFLEX) 4 MG tablet Take 4 mg by mouth. Patient states that she takes as needed  . TRESIBA FLEXTOUCH 200 UNIT/ML SOPN Inject 90 Units into the skin at bedtime.  . Turmeric 500 MG CAPS Take 500 mg by mouth  daily.   . Continuous Blood Gluc Sensor (FREESTYLE LIBRE 14 DAY SENSOR) MISC Inject 1 each into the skin every 14 (fourteen) days. Use as directed. (Patient not taking: Reported on 05/31/2019)  . glucose blood (FREESTYLE LITE) test strip Use as instructed (Patient not taking: Reported on 05/31/2019)  . [DISCONTINUED] promethazine (PHENERGAN) 25 MG tablet Take 25 mg by mouth every 6 (six) hours as needed for nausea. Only as needed   No facility-administered encounter medications on file as of 05/31/2019.      Objective: Blood pressure 120/69, pulse 87, temperature 98 F (36.7 C), temperature source Oral, resp. rate 18, height 5' 2"  (1.575 m), weight 180 lb 12.8 oz (82 kg). Patient is alert and in no acute distress. She does not have asterixis. Conjunctiva is pink. Sclera is nonicteric Oropharyngeal mucosa is normal. No neck masses or thyromegaly noted. Cardiac exam with regular rhythm normal S1 and S2. No murmur or gallop noted. Lungs are clear to auscultation. Abdomen is full but soft and nontender with organomegaly or masses. No LE edema or clubbing noted.  Labs/studies Results:  CBC Latest Ref Rng & Units 05/27/2019 02/10/2018 10/13/2017  WBC 4.0 - 10.5 K/uL 17.1(H) 13.9(H) 8.0  Hemoglobin 12.0 - 15.0 g/dL 14.5 13.4 12.8  Hematocrit 36.0 - 46.0 % 44.3 39.9 39.5  Platelets 150 - 400 K/uL 266 189 188    CMP Latest Ref Rng & Units 05/27/2019 04/12/2019 12/17/2018  Glucose 70 - 99 mg/dL 83 128 159(H)  BUN 6 - 20 mg/dL 23(H) 13 13  Creatinine 0.44 - 1.00 mg/dL 0.84 0.87 0.71  Sodium 135 - 145 mmol/L 140 137 140  Potassium 3.5 - 5.1 mmol/L 3.3(L) 4.0 4.2  Chloride 98 - 111 mmol/L 103 102 106  CO2 22 - 32 mmol/L 27 26 25   Calcium 8.9 - 10.3 mg/dL 10.0 9.4 8.9  Total Protein 6.5 - 8.1 g/dL 8.4(H) 7.5 6.7  Total Bilirubin 0.3 - 1.2 mg/dL 0.9 0.5 0.2  Alkaline Phos 38 - 126 U/L 133(H) - -  AST 15 - 41 U/L 53(H) 50(H) 29  ALT 0 - 44 U/L 48(H) 29 26    Hepatic Function Latest Ref Rng & Units  05/27/2019 04/12/2019 12/17/2018  Total Protein 6.5 - 8.1 g/dL 8.4(H) 7.5 6.7  Albumin 3.5 - 5.0 g/dL 4.0 - -  AST 15 - 41 U/L 53(H) 50(H) 29  ALT 0 - 44 U/L 48(H) 29 26  Alk Phosphatase 38 - 126 U/L 133(H) - -  Total Bilirubin 0.3 - 1.2 mg/dL 0.9 0.5 0.2  Bilirubin, Direct <=0.2 mg/dL - - -    Urine culture greater than 100,000 colonies per mL of E. Coli.  Review of [facility] records reveal that she had unenhanced abdominal pelvic CT on 03/25/2019 which revealed contour changes to liver suggestive of cirrhosis as well as multiple  small densities felt to be focal fat she also had diverticulosis.  Assessment:  #1.  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  Slight bump in transaminases may be related to urinary tract infection.  LFTs will be repeated in 4 weeks. She had unenhanced abdominal pelvic CT in June 2020 revealing multiple small hypodensities possibly focal fat.  I doubt that she has multi focal HCC.  #2.  Chronic GERD.  She is doing well with therapy.  #3.  Irritable bowel syndrome with diarrhea.  She is doing well with low-dose antispasmodic.  Plan:  Schedule right upper quadrant abdominal ultrasound. Patient will have LFTs repeated in [redacted] weeks along with alpha-fetoprotein. Office visit in 6 months.

## 2019-06-01 NOTE — Telephone Encounter (Signed)
Post ED Visit - Positive Culture Follow-up  Culture report reviewed by antimicrobial stewardship pharmacist: [place] Team []  Elenor Quinones, Pharm.D. []  Heide Guile, Pharm.D., BCPS AQ-ID []  Parks Neptune, Pharm.D., BCPS []  Alycia Rossetti, Pharm.D., BCPS []  [place], Pharm.D., BCPS, AAHIVP []  Legrand Como, Pharm.D., BCPS, AAHIVP []  Salome Arnt, PharmD, BCPS []  Johnnette Gourd, PharmD, BCPS []  Hughes Better, PharmD, BCPS []  Leeroy Cha, PharmD []  Laqueta Linden, PharmD, BCPS []  Albertina Parr, PharmD Nicoletta Dress PharmD  [place] Team []  Leodis Sias, PharmD []  Lindell Spar, PharmD []  Royetta Asal, PharmD []  Graylin Shiver, Rph []  Rema Fendt) Glennon Mac, PharmD []  Arlyn Dunning, PharmD []  Netta Cedars, PharmD []  Dia Sitter, PharmD []  Leone Haven, PharmD []  Gretta Arab, PharmD []  Theodis Shove, PharmD []  Peggyann Juba, PharmD []  Reuel Boom, PharmD  Positive urine culture Treated with cephalexin, organism sensitive to the same and no further patient follow-up is required at this time.  Hazle Nordmann 06/01/2019, 10:10 AM

## 2019-06-03 DIAGNOSIS — E8940 Asymptomatic postprocedural ovarian failure: Secondary | ICD-10-CM | POA: Diagnosis not present

## 2019-06-14 ENCOUNTER — Other Ambulatory Visit: Payer: Self-pay

## 2019-06-14 ENCOUNTER — Ambulatory Visit (HOSPITAL_COMMUNITY)
Admission: RE | Admit: 2019-06-14 | Discharge: 2019-06-14 | Disposition: A | Discharge status: Home / Self Care | Payer: Medicare Other | Source: Ambulatory Visit | Attending: Internal Medicine | Admitting: Internal Medicine

## 2019-06-14 DIAGNOSIS — R7989 Other specified abnormal findings of blood chemistry: Secondary | ICD-10-CM | POA: Diagnosis not present

## 2019-06-14 DIAGNOSIS — K7581 Nonalcoholic steatohepatitis (NASH): Secondary | ICD-10-CM | POA: Diagnosis not present

## 2019-06-14 DIAGNOSIS — K7460 Unspecified cirrhosis of liver: Secondary | ICD-10-CM | POA: Diagnosis not present

## 2019-06-15 ENCOUNTER — Ambulatory Visit
Admission: RE | Admit: 2019-06-15 | Discharge: 2019-06-15 | Disposition: A | Discharge status: Home / Self Care | Payer: Medicare Other | Source: Ambulatory Visit | Attending: Podiatry | Admitting: Podiatry

## 2019-06-15 DIAGNOSIS — I10 Essential (primary) hypertension: Secondary | ICD-10-CM | POA: Diagnosis not present

## 2019-06-15 DIAGNOSIS — Z6834 Body mass index (BMI) 34.0-34.9, adult: Secondary | ICD-10-CM | POA: Diagnosis not present

## 2019-06-15 DIAGNOSIS — Z79899 Other long term (current) drug therapy: Secondary | ICD-10-CM | POA: Diagnosis not present

## 2019-06-15 DIAGNOSIS — R600 Localized edema: Secondary | ICD-10-CM | POA: Diagnosis not present

## 2019-06-15 DIAGNOSIS — E1165 Type 2 diabetes mellitus with hyperglycemia: Secondary | ICD-10-CM | POA: Diagnosis not present

## 2019-06-15 DIAGNOSIS — M25561 Pain in right knee: Secondary | ICD-10-CM | POA: Diagnosis not present

## 2019-06-15 DIAGNOSIS — Z299 Encounter for prophylactic measures, unspecified: Secondary | ICD-10-CM | POA: Diagnosis not present

## 2019-06-23 ENCOUNTER — Other Ambulatory Visit: Payer: Self-pay

## 2019-06-23 ENCOUNTER — Ambulatory Visit (INDEPENDENT_AMBULATORY_CARE_PROVIDER_SITE_OTHER): Payer: Medicare Other | Admitting: Orthotics

## 2019-06-23 ENCOUNTER — Ambulatory Visit (INDEPENDENT_AMBULATORY_CARE_PROVIDER_SITE_OTHER): Payer: Medicare Other

## 2019-06-23 ENCOUNTER — Ambulatory Visit (INDEPENDENT_AMBULATORY_CARE_PROVIDER_SITE_OTHER): Payer: Medicare Other | Admitting: Podiatry

## 2019-06-23 DIAGNOSIS — M25372 Other instability, left ankle: Secondary | ICD-10-CM

## 2019-06-23 DIAGNOSIS — S90121A Contusion of right lesser toe(s) without damage to nail, initial encounter: Secondary | ICD-10-CM

## 2019-06-23 DIAGNOSIS — S86312S Strain of muscle(s) and tendon(s) of peroneal muscle group at lower leg level, left leg, sequela: Secondary | ICD-10-CM

## 2019-06-23 DIAGNOSIS — M25571 Pain in right ankle and joints of right foot: Secondary | ICD-10-CM

## 2019-06-23 DIAGNOSIS — M7672 Peroneal tendinitis, left leg: Secondary | ICD-10-CM

## 2019-06-23 DIAGNOSIS — S9032XD Contusion of left foot, subsequent encounter: Secondary | ICD-10-CM

## 2019-06-23 DIAGNOSIS — E1142 Type 2 diabetes mellitus with diabetic polyneuropathy: Secondary | ICD-10-CM

## 2019-06-23 NOTE — Progress Notes (Signed)

## 2019-06-24 ENCOUNTER — Other Ambulatory Visit: Payer: Self-pay | Admitting: "Endocrinology

## 2019-06-28 DIAGNOSIS — K7460 Unspecified cirrhosis of liver: Secondary | ICD-10-CM | POA: Diagnosis not present

## 2019-06-29 LAB — AFP TUMOR MARKER: AFP-Tumor Marker: 4 ng/mL

## 2019-06-30 DIAGNOSIS — M79606 Pain in leg, unspecified: Secondary | ICD-10-CM | POA: Diagnosis not present

## 2019-06-30 DIAGNOSIS — I10 Essential (primary) hypertension: Secondary | ICD-10-CM | POA: Diagnosis not present

## 2019-06-30 DIAGNOSIS — Z23 Encounter for immunization: Secondary | ICD-10-CM | POA: Diagnosis not present

## 2019-06-30 DIAGNOSIS — E1142 Type 2 diabetes mellitus with diabetic polyneuropathy: Secondary | ICD-10-CM | POA: Diagnosis not present

## 2019-06-30 DIAGNOSIS — E1165 Type 2 diabetes mellitus with hyperglycemia: Secondary | ICD-10-CM | POA: Diagnosis not present

## 2019-06-30 DIAGNOSIS — Z299 Encounter for prophylactic measures, unspecified: Secondary | ICD-10-CM | POA: Diagnosis not present

## 2019-06-30 DIAGNOSIS — Z6834 Body mass index (BMI) 34.0-34.9, adult: Secondary | ICD-10-CM | POA: Diagnosis not present

## 2019-07-04 ENCOUNTER — Other Ambulatory Visit (INDEPENDENT_AMBULATORY_CARE_PROVIDER_SITE_OTHER): Payer: Self-pay | Admitting: *Deleted

## 2019-07-04 ENCOUNTER — Telehealth (INDEPENDENT_AMBULATORY_CARE_PROVIDER_SITE_OTHER): Payer: Self-pay | Admitting: *Deleted

## 2019-07-04 DIAGNOSIS — I10 Essential (primary) hypertension: Secondary | ICD-10-CM | POA: Diagnosis not present

## 2019-07-04 DIAGNOSIS — M159 Polyosteoarthritis, unspecified: Secondary | ICD-10-CM | POA: Diagnosis not present

## 2019-07-04 DIAGNOSIS — E119 Type 2 diabetes mellitus without complications: Secondary | ICD-10-CM | POA: Diagnosis not present

## 2019-07-04 DIAGNOSIS — K7460 Unspecified cirrhosis of liver: Secondary | ICD-10-CM

## 2019-07-04 DIAGNOSIS — K7581 Nonalcoholic steatohepatitis (NASH): Secondary | ICD-10-CM

## 2019-07-04 NOTE — Telephone Encounter (Signed)
Per Dr.Rehman , if the patient feels that it is Constipation  She is to take gallon of Miralax. If not and the pian is severe she is to go to the ED for further evaluation, and possible diagnostics..  Patient was called and given Dr.Rehna's instructions, she says that she will take the Mirlax. If I do not hear from  her tomorrow then I will know that this took care of the problem.  Otherwise , the patient will go to the ED.

## 2019-07-04 NOTE — Telephone Encounter (Signed)
Called patient with her recent lab results. She states that yesterday she was having sever abdominal pain, she was bend over. Denies fever,N&V.  Today she is really sore , hurts to touch, some pain every now and then.. She did take mag citrate thinking that it may be her bowels , she had no result with that. Patient feels that she has a infection in her intestines or a diverticulitis flare.  To address with Dr.Rehman and we will contact the patient.

## 2019-07-05 ENCOUNTER — Other Ambulatory Visit: Payer: Self-pay | Admitting: Nurse Practitioner

## 2019-07-05 DIAGNOSIS — M545 Low back pain, unspecified: Secondary | ICD-10-CM

## 2019-07-05 DIAGNOSIS — G8929 Other chronic pain: Secondary | ICD-10-CM

## 2019-07-06 ENCOUNTER — Other Ambulatory Visit (INDEPENDENT_AMBULATORY_CARE_PROVIDER_SITE_OTHER): Payer: Self-pay | Admitting: Internal Medicine

## 2019-07-07 ENCOUNTER — Other Ambulatory Visit: Payer: Self-pay

## 2019-07-07 ENCOUNTER — Ambulatory Visit
Admission: RE | Admit: 2019-07-07 | Discharge: 2019-07-07 | Disposition: A | Discharge status: Home / Self Care | Payer: Medicare Other | Source: Ambulatory Visit | Attending: Nurse Practitioner | Admitting: Nurse Practitioner

## 2019-07-07 DIAGNOSIS — G8929 Other chronic pain: Secondary | ICD-10-CM

## 2019-07-07 DIAGNOSIS — M545 Low back pain, unspecified: Secondary | ICD-10-CM

## 2019-07-07 MED ORDER — METHYLPREDNISOLONE ACETATE 40 MG/ML INJ SUSP (RADIOLOG
120.0000 mg | Freq: Once | INTRAMUSCULAR | Status: AC
  Administered 2019-07-07: 120 mg via EPIDURAL

## 2019-07-07 MED ORDER — IOPAMIDOL (ISOVUE-M 200) INJECTION 41%
1.0000 mL | Freq: Once | INTRAMUSCULAR | Status: AC
  Administered 2019-07-07: 1 mL via EPIDURAL

## 2019-07-07 NOTE — Discharge Instructions (Signed)

## 2019-07-11 DIAGNOSIS — I10 Essential (primary) hypertension: Secondary | ICD-10-CM | POA: Diagnosis not present

## 2019-07-11 DIAGNOSIS — H8109 Meniere's disease, unspecified ear: Secondary | ICD-10-CM | POA: Diagnosis not present

## 2019-07-11 DIAGNOSIS — Z299 Encounter for prophylactic measures, unspecified: Secondary | ICD-10-CM | POA: Diagnosis not present

## 2019-07-11 DIAGNOSIS — Z6833 Body mass index (BMI) 33.0-33.9, adult: Secondary | ICD-10-CM | POA: Diagnosis not present

## 2019-07-11 DIAGNOSIS — M7050 Other bursitis of knee, unspecified knee: Secondary | ICD-10-CM | POA: Diagnosis not present

## 2019-07-12 DIAGNOSIS — H2513 Age-related nuclear cataract, bilateral: Secondary | ICD-10-CM | POA: Diagnosis not present

## 2019-07-12 DIAGNOSIS — H25812 Combined forms of age-related cataract, left eye: Secondary | ICD-10-CM | POA: Diagnosis not present

## 2019-07-12 DIAGNOSIS — H25013 Cortical age-related cataract, bilateral: Secondary | ICD-10-CM | POA: Diagnosis not present

## 2019-07-14 ENCOUNTER — Ambulatory Visit: Payer: Medicare Other | Admitting: Podiatry

## 2019-07-20 NOTE — Progress Notes (Signed)
Subjective:  Patient ID: [name], female    DOB: Aug 09, 1961,  MRN: [id number]  No chief complaint on file.   58 y.o. female presents with the above complaint. Here for MRI review still having pain in the ankle area.  Also states that she hit her toe fifth toe on the right foot and it is hurting would like that looked at as well  Review of Systems: Negative except as noted in the HPI. Denies N/V/F/Ch.  Past Medical History:  Diagnosis Date  . Anxiety disorder   . Chronic low back pain   . Cirrhosis of liver ([place])   . Colitis   . Diabetes mellitus    Type II  . Diabetic neuropathy ([place])   . Diverticulitis   . Fatty liver   . GERD (gastroesophageal reflux disease)   . High cholesterol   . Hypertension   . Irritable bowel syndrome   . Meniere's disease   . Neuropathy   . Pain     Current Outpatient Medications:  .  acetaZOLAMIDE (DIAMOX) 250 MG tablet, Take 250 mg by mouth daily. , Disp: , Rfl:  .  acyclovir (ZOVIRAX) 400 MG tablet, Take 400 mg by mouth 3 (three) times daily as needed (for fever blisters). , Disp: , Rfl:  .  atorvastatin (LIPITOR) 20 MG tablet, TAKE ONE TABLET BY MOUTH DAILY., Disp: 90 tablet, Rfl: 0 .  cephALEXin (KEFLEX) 500 MG capsule, Take 1 capsule (500 mg total) by mouth 4 (four) times daily., Disp: 40 capsule, Rfl: 0 .  chlorhexidine (PERIDEX) 0.12 % solution, as needed. , Disp: , Rfl:  .  Cholecalciferol (VITAMIN D3) 2000 units capsule, Take 4,000 Units by mouth daily. , Disp: , Rfl:  .  Continuous Blood Gluc Sensor (FREESTYLE LIBRE 14 DAY SENSOR) MISC, Inject 1 each into the skin every 14 (fourteen) days. Use as directed. (Patient not taking: Reported on 05/31/2019), Disp: 2 each, Rfl: 2 .  cyanocobalamin (,VITAMIN B-12,) 1000 MCG/ML injection, Inject 1,000 mcg into the muscle every 30 (thirty) days., Disp: , Rfl: 5 .  diazepam (VALIUM) 2 MG tablet, Take 2 mg by mouth every 12 (twelve) hours as needed for anxiety. , Disp: , Rfl:  .  dicyclomine  (BENTYL) 20 MG tablet, TAKE ONE TABLET BY MOUTH THREE TIMES DAILY, Disp: 90 tablet, Rfl: 5 .  DULoxetine (CYMBALTA) 60 MG capsule, Take 60 mg by mouth daily., Disp: , Rfl:  .  estradiol (ESTRACE) 0.5 MG tablet, Take 0.5 mg by mouth every evening. , Disp: , Rfl:  .  fluconazole (DIFLUCAN) 150 MG tablet, Take 150 mg by mouth as needed. , Disp: , Rfl:  .  fluticasone (FLONASE) 50 MCG/ACT nasal spray, Place 2 sprays into both nostrils every morning. , Disp: , Rfl:  .  glucose blood (FREESTYLE LITE) test strip, Use as instructed (Patient not taking: Reported on 05/31/2019), Disp: 100 each, Rfl: 2 .  hydrochlorothiazide (HYDRODIURIL) 25 MG tablet, Take 25 mg by mouth daily. , Disp: , Rfl:  .  HYDROcodone-acetaminophen (NORCO/VICODIN) 5-325 MG tablet, Take 1 tablet by mouth every 6 (six) hours as needed for severe pain., Disp: 12 tablet, Rfl: 0 .  Influenza Vac Subunit Quad (FLUCELVAX QUADRIVALENT) 0.5 ML SUSY, Flucelvax Quad 2019-2020 (PF) 60 mcg (15 mcg x 4)/0.5 mL IM syringe  INJECT 0.5ML INTRAMUSCULARLY ONCE., Disp: , Rfl:  .  insulin aspart (NOVOLOG FLEXPEN) 100 UNIT/ML FlexPen, Inject 15 Units into the skin 3 (three) times daily with meals., Disp: 30 mL, Rfl:  2 .  levothyroxine (SYNTHROID) 75 MCG tablet, TAKE ONE TABLET BY MOUTH DAILY BEFORE BREAKFAST, Disp: 30 tablet, Rfl: 3 .  metFORMIN (GLUCOPHAGE) 500 MG tablet, Take 1 tablet (500 mg total) by mouth 2 (two) times daily with a meal., Disp: 180 tablet, Rfl: 1 .  metoCLOPramide (REGLAN) 10 MG tablet, Take 1 tablet (10 mg total) by mouth every 6 (six) hours as needed for nausea (or headache)., Disp: 30 tablet, Rfl: 0 .  metoprolol (LOPRESSOR) 50 MG tablet, Take 25 mg by mouth daily. , Disp: , Rfl:  .  ondansetron (ZOFRAN) 4 MG tablet, Take 1 tablet (4 mg total) by mouth every 8 (eight) hours as needed for nausea or vomiting., Disp: 12 tablet, Rfl: 0 .  pantoprazole (PROTONIX) 40 MG tablet, 40 mg daily. , Disp: , Rfl: 4 .  PARoxetine (PAXIL) 40 MG  tablet, Take 20 mg by mouth daily. , Disp: , Rfl:  .  Polyethyl Glycol-Propyl Glycol (SYSTANE OP), Place 1 drop into both eyes daily., Disp: , Rfl:  .  PRODIGY TWIST TOP LANCETS 28G MISC, TEST TWICE DAILY, Disp: 200 each, Rfl: 2 .  tiZANidine (ZANAFLEX) 4 MG tablet, Take 4 mg by mouth. Patient states that she takes as needed, Disp: , Rfl:  .  TRESIBA FLEXTOUCH 200 UNIT/ML SOPN, INJECT 90 UNITS INTO THE SKIN AT BEDTIME., Disp: 9 mL, Rfl: 2 .  Turmeric 500 MG CAPS, Take 500 mg by mouth daily. , Disp: , Rfl:   Social History   Tobacco Use  Smoking Status Never Smoker  Smokeless Tobacco Never Used    Allergies  Allergen Reactions  . Flagyl [Metronidazole Hcl] Itching and Rash  . Nsaids Nausea Only   Objective:   There were no vitals filed for this visit. There is no height or weight on file to calculate BMI. Constitutional Well developed. Well nourished.  Vascular Dorsalis pedis pulses palpable bilaterally. Posterior tibial pulses palpable bilaterally. Capillary refill normal to all digits.  No cyanosis or clubbing noted. Pedal hair growth normal.  Neurologic Normal speech. Oriented to person, place, and time. Epicritic sensation to light touch grossly present bilaterally.  Dermatologic Nails well groomed and normal in appearance. No open wounds. No skin lesions.  Orthopedic: POP Left first and second MPJs.  Pain palpation about the left peroneal tendons, pain at left ATFL   Radiographs: Taken and reviewed no occult fracture. Assessment:   1. Contusion of fifth toe of right foot, initial encounter   2. Sinus tarsi syndrome of right ankle    Plan:  Patient was evaluated and treated and all questions answered.  Contusion right 5th toe -X-rays taken without evidence of fracture  Left ankle chronic peroneal pain concern for re-tear -MRI reviewed sinus tarsitis, intact peroneal tendons -Sinus tarsi injection delivered  Procedure: Injection Intermediate Joint Consent:  Verbal consent obtained. Location: Left sinus tarsi. Skin Prep: alcohol. Injectate: 1 cc 0.5% marcaine plain, 1 cc dexamethasone phosphate, 0.5 cc kenalog 10. Disposition: Patient tolerated procedure well. Injection site dressed with a band-aid.   No follow-ups on file.

## 2019-07-25 DIAGNOSIS — H2511 Age-related nuclear cataract, right eye: Secondary | ICD-10-CM | POA: Diagnosis not present

## 2019-07-25 DIAGNOSIS — H25011 Cortical age-related cataract, right eye: Secondary | ICD-10-CM | POA: Diagnosis not present

## 2019-07-25 LAB — HM DIABETES EYE EXAM

## 2019-07-28 DIAGNOSIS — E039 Hypothyroidism, unspecified: Secondary | ICD-10-CM | POA: Diagnosis not present

## 2019-07-28 DIAGNOSIS — Z794 Long term (current) use of insulin: Secondary | ICD-10-CM | POA: Diagnosis not present

## 2019-07-28 DIAGNOSIS — E118 Type 2 diabetes mellitus with unspecified complications: Secondary | ICD-10-CM | POA: Diagnosis not present

## 2019-07-28 DIAGNOSIS — E1165 Type 2 diabetes mellitus with hyperglycemia: Secondary | ICD-10-CM | POA: Diagnosis not present

## 2019-07-29 LAB — COMPLETE METABOLIC PANEL WITH GFR
AG Ratio: 1.3 (calc) (ref 1.0–2.5)
ALT: 37 U/L — ABNORMAL HIGH (ref 6–29)
AST: 38 U/L — ABNORMAL HIGH (ref 10–35)
Albumin: 3.8 g/dL (ref 3.6–5.1)
Alkaline phosphatase (APISO): 102 U/L (ref 37–153)
BUN: 23 mg/dL (ref 7–25)
CO2: 25 mmol/L (ref 20–32)
Calcium: 9.6 mg/dL (ref 8.6–10.4)
Chloride: 104 mmol/L (ref 98–110)
Creat: 0.95 mg/dL (ref 0.50–1.05)
GFR, Est African American: 77 mL/min/{1.73_m2} (ref >=60)
GFR, Est Non African American: 66 mL/min/{1.73_m2} (ref >=60)
Globulin: 2.9 g/dL (calc) (ref 1.9–3.7)
Glucose, Bld: 79 mg/dL (ref 65–99)
Potassium: 3.7 mmol/L (ref 3.5–5.3)
Sodium: 138 mmol/L (ref 135–146)
Total Bilirubin: 0.4 mg/dL (ref 0.2–1.2)
Total Protein: 6.7 g/dL (ref 6.1–8.1)

## 2019-07-29 LAB — TSH: TSH: 8.57 mIU/L — ABNORMAL HIGH (ref 0.40–4.50)

## 2019-07-29 LAB — T4, FREE: Free T4: 0.9 ng/dL (ref 0.8–1.8)

## 2019-07-29 LAB — HEMOGLOBIN A1C
Hgb A1c MFr Bld: 8.8 % of total Hgb — ABNORMAL HIGH (ref <5.7)
Mean Plasma Glucose: 206 (calc)
eAG (mmol/L): 11.4 (calc)

## 2019-08-01 ENCOUNTER — Other Ambulatory Visit: Payer: Self-pay | Admitting: "Endocrinology

## 2019-08-02 ENCOUNTER — Ambulatory Visit: Payer: Medicare Other | Admitting: "Endocrinology

## 2019-08-02 DIAGNOSIS — H25011 Cortical age-related cataract, right eye: Secondary | ICD-10-CM | POA: Diagnosis not present

## 2019-08-02 DIAGNOSIS — H25811 Combined forms of age-related cataract, right eye: Secondary | ICD-10-CM | POA: Diagnosis not present

## 2019-08-02 DIAGNOSIS — H2511 Age-related nuclear cataract, right eye: Secondary | ICD-10-CM | POA: Diagnosis not present

## 2019-08-03 ENCOUNTER — Other Ambulatory Visit: Payer: Self-pay

## 2019-08-03 ENCOUNTER — Other Ambulatory Visit: Payer: Self-pay | Admitting: "Endocrinology

## 2019-08-03 ENCOUNTER — Encounter: Payer: Self-pay | Admitting: "Endocrinology

## 2019-08-03 ENCOUNTER — Ambulatory Visit (INDEPENDENT_AMBULATORY_CARE_PROVIDER_SITE_OTHER): Payer: Medicare Other | Admitting: "Endocrinology

## 2019-08-03 DIAGNOSIS — E1165 Type 2 diabetes mellitus with hyperglycemia: Secondary | ICD-10-CM

## 2019-08-03 DIAGNOSIS — Z794 Long term (current) use of insulin: Secondary | ICD-10-CM | POA: Diagnosis not present

## 2019-08-03 DIAGNOSIS — E118 Type 2 diabetes mellitus with unspecified complications: Secondary | ICD-10-CM | POA: Diagnosis not present

## 2019-08-03 DIAGNOSIS — IMO0002 Reserved for concepts with insufficient information to code with codable children: Secondary | ICD-10-CM

## 2019-08-03 DIAGNOSIS — E039 Hypothyroidism, unspecified: Secondary | ICD-10-CM | POA: Diagnosis not present

## 2019-08-03 DIAGNOSIS — E782 Mixed hyperlipidemia: Secondary | ICD-10-CM

## 2019-08-03 DIAGNOSIS — I10 Essential (primary) hypertension: Secondary | ICD-10-CM

## 2019-08-03 MED ORDER — LEVOTHYROXINE SODIUM 100 MCG PO TABS: 100.0000 ug | ORAL_TABLET | Freq: Every day | ORAL | 6 refills | Status: DC

## 2019-08-03 NOTE — Progress Notes (Signed)
08/03/2019                                                    Endocrinology Telehealth Visit Follow up Note -During COVID -19 Pandemic  This visit type was conducted due to national recommendations for restrictions regarding the COVID-19 Pandemic  in an effort to limit this patient's exposure and mitigate transmission of the corona virus.  Due to her co-morbid illnesses, [name] is at  moderate to high risk for complications without adequate follow up.  This format is felt to be most appropriate for her at this time.  I connected with this patient on 08/03/2019   by telephone and verified that I am speaking with the correct person using two identifiers. [name], [birth year]. she has verbally consented to this visit. All issues noted in this document were discussed and addressed. The format was not optimal for physical exam.    Subjective:    Patient ID: [name], female    DOB: Feb 27, 1961.  She is being engaged in telehealth via telephone in follow-up for  management of diabetes,  hyperlipidemia, hypothyroidism during the coronavirus pandemic.    PMD: Heather Chroman, MD  Past Medical History:  Diagnosis Date  . Anxiety disorder   . Chronic low back pain   . Cirrhosis of liver ([place])   . Colitis   . Diabetes mellitus    Type II  . Diabetic neuropathy ([place])   . Diverticulitis   . Fatty liver   . GERD (gastroesophageal reflux disease)   . High cholesterol   . Hypertension   . Irritable bowel syndrome   . Meniere's disease   . Neuropathy   . Pain    Past Surgical History:  Procedure Laterality Date  . ABDOMINAL HYSTERECTOMY    . BIOPSY  03/20/2017   Procedure: BIOPSY;  Surgeon: [name], MD;  [facility];  Service: Endoscopy;;  colon gastric  . bladder tact  2005  . CHOLECYSTECTOMY  08/11  . COLONOSCOPY  2208  . COLONOSCOPY  In of September 2014   @ [facility]/Dr,.[name]  . COLONOSCOPY WITH PROPOFOL N/A 03/20/2017   Procedure: COLONOSCOPY WITH  PROPOFOL;  Surgeon: [name], MD;  [facility];  Service: Endoscopy;  Laterality: N/A;  . ECTOPIC PREGNANCY SURGERY  1996  . ESOPHAGOGASTRODUODENOSCOPY (EGD) WITH PROPOFOL N/A 03/20/2017   Procedure: ESOPHAGOGASTRODUODENOSCOPY (EGD) WITH PROPOFOL;  Surgeon: [name], MD;  [facility];  Service: Endoscopy;  Laterality: N/A;  12:45  . HERNIA REPAIR  09/17/11  . LIGAMENT REPAIR Left 02/10/2018   Procedure: Left  ANKLE ATFL Repair and Ligament Augmentation, Injection of Tendon Splint Application;  Surgeon: [name], DPM;  Location: [facility];  Service: Podiatry;  Laterality: Left;  . [place]  2009   Preformed on the right shoulder  . OVARIAN CYST REMOVAL     right side  . PARTIAL HYSTERECTOMY  2005  . POLYPECTOMY  03/20/2017   Procedure: POLYPECTOMY;  Surgeon: [name], MD;  [facility];  Service: Endoscopy;;  colon  . TENDON REPAIR Left 02/10/2018   Procedure: PERONEAL TENDON REPAIR and Synovectomy Peroneal Tendon;  Surgeon: [name], DPM;  Location: [facility];  Service: Podiatry;  Laterality: Left;  . UPPER GASTROINTESTINAL ENDOSCOPY  2008  .  URETERAL STENT PLACEMENT     Social History   Socioeconomic History  . Marital status: Divorced    Spouse name: Not on file  . Number of children: Not on file  . Years of education: Not on file  . Highest education level: Not on file  Occupational History  . Not on file  Social Needs  . Financial resource strain: Not on file  . Food insecurity:    Worry: Not on file    Inability: Not on file  . Transportation needs:    Medical: Not on file    Non-medical: Not on file  Tobacco Use  . Smoking status: Never Smoker  . Smokeless tobacco: Never Used  Substance and Sexual Activity  . Alcohol use: No  . Drug use: No  . Sexual activity: Yes    Birth control/protection: Surgical  Lifestyle  . Physical activity:    Days per week: Not on file    Minutes per session: Not on file  .  Stress: Not on file  Relationships  . Social connections:    Talks on phone: Not on file    Gets together: Not on file    Attends religious service: Not on file    Active member of club or organization: Not on file    Attends meetings of clubs or organizations: Not on file    Relationship status: Not on file  Other Topics Concern  . Not on file  Social History Narrative  . Not on file   Current Outpatient Medications on File Prior to Visit  Medication Sig Dispense Refill  . acetaZOLAMIDE (DIAMOX) 250 MG tablet Take 250 mg by mouth daily.     Marland Kitchen acyclovir (ZOVIRAX) 400 MG tablet Take 400 mg by mouth 3 (three) times daily as needed (for fever blisters).     Marland Kitchen atorvastatin (LIPITOR) 20 MG tablet TAKE ONE TABLET BY MOUTH DAILY. 90 tablet 0  . cephALEXin (KEFLEX) 500 MG capsule Take 1 capsule (500 mg total) by mouth 4 (four) times daily. 40 capsule 0  . chlorhexidine (PERIDEX) 0.12 % solution as needed.     . Cholecalciferol (VITAMIN D3) 2000 units capsule Take 4,000 Units by mouth daily.     . Continuous Blood Gluc Sensor (FREESTYLE LIBRE 14 DAY SENSOR) MISC Inject 1 each into the skin every 14 (fourteen) days. Use as directed. (Patient not taking: Reported on 05/31/2019) 2 each 2  . cyanocobalamin (,VITAMIN B-12,) 1000 MCG/ML injection Inject 1,000 mcg into the muscle every 30 (thirty) days.  5  . diazepam (VALIUM) 2 MG tablet Take 2 mg by mouth every 12 (twelve) hours as needed for anxiety.     . dicyclomine (BENTYL) 20 MG tablet TAKE ONE TABLET BY MOUTH THREE TIMES DAILY 90 tablet 5  . DULoxetine (CYMBALTA) 60 MG capsule Take 60 mg by mouth daily.    Marland Kitchen estradiol (ESTRACE) 0.5 MG tablet Take 0.5 mg by mouth every evening.     . fluconazole (DIFLUCAN) 150 MG tablet Take 150 mg by mouth as needed.     . fluticasone (FLONASE) 50 MCG/ACT nasal spray Place 2 sprays into both nostrils every morning.     Marland Kitchen glucose blood (FREESTYLE LITE) test strip Use as instructed (Patient not taking: Reported on  05/31/2019) 100 each 2  . hydrochlorothiazide (HYDRODIURIL) 25 MG tablet Take 25 mg by mouth daily.     Marland Kitchen HYDROcodone-acetaminophen (NORCO/VICODIN) 5-325 MG tablet Take 1 tablet by mouth every 6 (six) hours as needed for  severe pain. 12 tablet 0  . Influenza Vac Subunit Quad (FLUCELVAX QUADRIVALENT) 0.5 ML SUSY Flucelvax Quad 2019-2020 (PF) 60 mcg (15 mcg x 4)/0.5 mL IM syringe  INJECT 0.5ML INTRAMUSCULARLY ONCE.    Marland Kitchen insulin aspart (NOVOLOG FLEXPEN) 100 UNIT/ML FlexPen Inject 15 Units into the skin 3 (three) times daily with meals. 30 mL 2  . levothyroxine (SYNTHROID) 75 MCG tablet TAKE ONE TABLET BY MOUTH DAILY BEFORE BREAKFAST 30 tablet 3  . metFORMIN (GLUCOPHAGE) 500 MG tablet Take 1 tablet (500 mg total) by mouth 2 (two) times daily with a meal. 180 tablet 1  . metoCLOPramide (REGLAN) 10 MG tablet Take 1 tablet (10 mg total) by mouth every 6 (six) hours as needed for nausea (or headache). 30 tablet 0  . metoprolol (LOPRESSOR) 50 MG tablet Take 25 mg by mouth daily.     . ondansetron (ZOFRAN) 4 MG tablet Take 1 tablet (4 mg total) by mouth every 8 (eight) hours as needed for nausea or vomiting. 12 tablet 0  . pantoprazole (PROTONIX) 40 MG tablet 40 mg daily.   4  . PARoxetine (PAXIL) 40 MG tablet Take 20 mg by mouth daily.     Vladimir Faster Glycol-Propyl Glycol (SYSTANE OP) Place 1 drop into both eyes daily.    Marland Kitchen PRODIGY TWIST TOP LANCETS 28G MISC TEST TWICE DAILY 200 each 2  . tiZANidine (ZANAFLEX) 4 MG tablet Take 4 mg by mouth. Patient states that she takes as needed    . TRESIBA FLEXTOUCH 200 UNIT/ML SOPN INJECT 90 UNITS INTO THE SKIN AT BEDTIME. 9 mL 2  . Turmeric 500 MG CAPS Take 500 mg by mouth daily.      No current facility-administered medications on file prior to visit.      No facility-administered encounter medications on file as of 12/27/2018.    ALLERGIES: Allergies  Allergen Reactions  . Flagyl [Metronidazole Hcl] Itching and Rash  . Nsaids Nausea Only   VACCINATION  STATUS: Immunization History  Administered Date(s) Administered  . Influenza Inj Mdck Quad Pf 08/07/2017, 08/11/2018    Diabetes  She  has chronically uncontrolled, symptomatic, complicated type 2 diabetes mellitus. Onset time: She was diagnosed approximately at age 22 years.  Recently improved A1c to 8.8% from 4.4%.  She verbally reports that her fasting blood glucose ranges from 41-250, lunchtime readings range from 150-250, suppertime readings from 80-250.  She reports a few random hypoglycemia .  Did not document her blood glucose readings and insulin administration records consistently.   She is following a generally unhealthy diet. When asked about meal planning, she reported none. She has not had a previous visit with a dietitian. She never participates in exercise.   For her hypothyroidism, she is on levothyroxine 75 mcg p.o. nightly.  She reports compliance.  She has no new complaints.   Hyperlipidemia  This is a chronic problem. The current episode started more than 1 year ago. The problem is controlled. Exacerbating diseases include diabetes and obesity. Pertinent negatives include no chest pain, myalgias or shortness of breath. Current antihyperlipidemic treatment includes statins. Risk factors for coronary artery disease include diabetes mellitus, dyslipidemia, obesity, a sedentary lifestyle and post-menopausal.    Review of systems: Limited as above.  Objective:     Recent Results (from the past 2160 hour(s))  POC CBG, ED     Status: None   Collection Time: 05/27/19  7:56 PM  Result Value Ref Range   Glucose-Capillary 95 70 - 99 mg/dL  Comment 1 Notify RN    Comment 2 Document in Chart   CBC     Status: Abnormal   Collection Time: 05/27/19  8:54 PM  Result Value Ref Range   WBC 17.1 (H) 4.0 - 10.5 K/uL   RBC 4.80 3.87 - 5.11 MIL/uL   Hemoglobin 14.5 12.0 - 15.0 g/dL   HCT 44.3 36.0 - 46.0 %   MCV 92.3 80.0 - 100.0 fL   MCH 30.2 26.0 - 34.0 pg   MCHC 32.7 30.0  - 36.0 g/dL   RDW 14.3 11.5 - 15.5 %   Platelets 266 150 - 400 K/uL   nRBC 0.0 0.0 - 0.2 %    Comment: Performed at [facility], [address], [place], [place] [address]  Lipase, blood     Status: None   Collection Time: 05/27/19  8:54 PM  Result Value Ref Range   Lipase 22 11 - 51 U/L    Comment: Performed at [facility], [address], [place], [place] [address]  Comprehensive metabolic panel     Status: Abnormal   Collection Time: 05/27/19  8:54 PM  Result Value Ref Range   Sodium 140 135 - 145 mmol/L   Potassium 3.3 (L) 3.5 - 5.1 mmol/L   Chloride 103 98 - 111 mmol/L   CO2 27 22 - 32 mmol/L   Glucose, Bld 83 70 - 99 mg/dL   BUN 23 (H) 6 - 20 mg/dL   Creatinine, Ser 0.84 0.44 - 1.00 mg/dL   Calcium 10.0 8.9 - 10.3 mg/dL   Total Protein 8.4 (H) 6.5 - 8.1 g/dL   Albumin 4.0 3.5 - 5.0 g/dL   AST 53 (H) 15 - 41 U/L   ALT 48 (H) 0 - 44 U/L   Alkaline Phosphatase 133 (H) 38 - 126 U/L   Total Bilirubin 0.9 0.3 - 1.2 mg/dL   GFR calc non Af Amer >60 >60 mL/min   GFR calc Af Amer >60 >60 mL/min   Anion gap 10 5 - 15    Comment: Performed at [facility], [address] [place], [place] [address]  Urinalysis, Routine w reflex microscopic     Status: Abnormal   Collection Time: 05/27/19  9:03 PM  Result Value Ref Range   Color, Urine YELLOW YELLOW   APPearance HAZY (A) CLEAR   Specific Gravity, Urine 1.005 1.005 - 1.030   pH 6.0 5.0 - 8.0   Glucose, UA NEGATIVE NEGATIVE mg/dL   Hgb urine dipstick NEGATIVE NEGATIVE   Bilirubin Urine NEGATIVE NEGATIVE   Ketones, ur NEGATIVE NEGATIVE mg/dL   Protein, ur NEGATIVE NEGATIVE mg/dL   Nitrite POSITIVE (A) NEGATIVE   Leukocytes,Ua NEGATIVE NEGATIVE   RBC / HPF 0-5 0 - 5 RBC/hpf   WBC, UA 0-5 0 - 5 WBC/hpf   Bacteria, UA RARE (A) NONE SEEN   Squamous Epithelial / LPF 11-20 0 - 5    Comment: Performed at [facility], [address], [place], [place] [address]  Urine culture     Status: Abnormal   Collection Time: 05/27/19  9:18 PM    Specimen: Urine, Clean Catch  Result Value Ref Range   Specimen Description      URINE, CLEAN CATCH Performed at [facility], [address] [place], [place] [address]    Special Requests      NONE Performed at [facility], [address] [place], [place] [address]    Culture >=100,000 COLONIES/mL ESCHERICHIA COLI (A)  Report Status 05/31/2019 FINAL    Organism ID, Bacteria ESCHERICHIA COLI (A)       Susceptibility   Escherichia coli - MIC*    AMPICILLIN >=32 RESISTANT Resistant     CEFAZOLIN <=4 SENSITIVE Sensitive     CEFTRIAXONE <=1 SENSITIVE Sensitive     CIPROFLOXACIN <=0.25 SENSITIVE Sensitive     GENTAMICIN <=1 SENSITIVE Sensitive     IMIPENEM <=0.25 SENSITIVE Sensitive     NITROFURANTOIN <=16 SENSITIVE Sensitive     TRIMETH/SULFA <=20 SENSITIVE Sensitive     AMPICILLIN/SULBACTAM >=32 RESISTANT Resistant     PIP/TAZO <=4 SENSITIVE Sensitive     Extended ESBL NEGATIVE Sensitive     * >=100,000 COLONIES/mL ESCHERICHIA COLI  POC CBG, ED     Status: None   Collection Time: 05/28/19  3:45 AM  Result Value Ref Range   Glucose-Capillary 95 70 - 99 mg/dL  AFP tumor marker     Status: None   Collection Time: 06/28/19  1:00 PM  Result Value Ref Range   AFP-Tumor Marker 4.0 ng/mL    Comment: Reference Range:   <6.1 The use of AFP as a tumor marker in pregnant females is not recommended. . . This test was performed using the Beckman Coulter chemiluminescent method. Values obtained from different assay methods cannot be used interchangeably. AFP levels, regardless of value, should not be interpreted as absolute evidence of the presence or absence of disease. Marland Kitchen   Hemoglobin A1c     Status: Abnormal   Collection Time: 07/28/19  8:22 AM  Result Value Ref Range   Hgb A1c MFr Bld 8.8 (H) <5.7 % of total Hgb    Comment: For someone without known diabetes, a hemoglobin A1c value of 6.5% or greater indicates that they may have  diabetes and this should be confirmed with  a follow-up  test. . For someone with known diabetes, a value <7% indicates  that their diabetes is well controlled and a value  greater than or equal to 7% indicates suboptimal  control. A1c targets should be individualized based on  duration of diabetes, age, comorbid conditions, and  other considerations. . Currently, no consensus exists regarding use of hemoglobin A1c for diagnosis of diabetes for children. .    Mean Plasma Glucose 206 (calc)   eAG (mmol/L) 11.4 (calc)  COMPLETE METABOLIC PANEL WITH GFR     Status: Abnormal   Collection Time: 07/28/19  8:22 AM  Result Value Ref Range   Glucose, Bld 79 65 - 99 mg/dL    Comment: .            Fasting reference interval .    BUN 23 7 - 25 mg/dL   Creat 0.95 0.50 - 1.05 mg/dL    Comment: For patients >54 years of age, the reference limit for Creatinine is approximately 13% higher for people identified as African-American. .    GFR, Est Non African American 66 > OR = 60 mL/min/1.60m   GFR, Est African American 77 > OR = 60 mL/min/1.753m  BUN/Creatinine Ratio NOT APPLICABLE 6 - 22 (calc)   Sodium 138 135 - 146 mmol/L   Potassium 3.7 3.5 - 5.3 mmol/L   Chloride 104 98 - 110 mmol/L   CO2 25 20 - 32 mmol/L   Calcium 9.6 8.6 - 10.4 mg/dL   Total Protein 6.7 6.1 - 8.1 g/dL   Albumin 3.8 3.6 - 5.1 g/dL   Globulin 2.9 1.9 - 3.7 g/dL (calc)   AG Ratio  1.3 1.0 - 2.5 (calc)   Total Bilirubin 0.4 0.2 - 1.2 mg/dL   Alkaline phosphatase (APISO) 102 37 - 153 U/L   AST 38 (H) 10 - 35 U/L   ALT 37 (H) 6 - 29 U/L  TSH     Status: Abnormal   Collection Time: 07/28/19  8:22 AM  Result Value Ref Range   TSH 8.57 (H) 0.40 - 4.50 mIU/L  T4, free     Status: None   Collection Time: 07/28/19  8:22 AM  Result Value Ref Range   Free T4 0.9 0.8 - 1.8 ng/dL      Lipid Panel     Component Value Date/Time   CHOL 146 10/13/2017 0816   TRIG 90 10/13/2017 0816   HDL 49 (L) 10/13/2017 0816   CHOLHDL 3.0 10/13/2017 0816   VLDL 29  06/30/2016 1251   LDLCALC 80 10/13/2017 0816     Assessment & Plan:   1. Uncontrolled type 2 diabetes mellitus with complication, with long-term current use of insulin  - Patient has currently uncontrolled symptomatic type 2 DM since  58 years of age. -Her reported glycemic readings are questionable, since she did not document prior low glucose readings nor insulin administration activities.  Her previsit labs show improved A1c of 8.8% from 12.4%.     Recent labs reviewed.   Her diabetes is complicated by obesity, insulin resistance, and patient remains at a high risk for more acute and chronic complications of diabetes which include CAD, CVA, CKD, retinopathy, and neuropathy. These are all discussed in detail with the patient.  - I have counseled the patient on diet management and weight loss, by adopting a carbohydrate restricted/protein rich diet.  - she  admits there is a room for improvement in her diet and drink choices. -  Suggestion is made for her to avoid simple carbohydrates  from her diet including Cakes, Sweet Desserts / Pastries, Ice Cream, Soda (diet and regular), Sweet Tea, Candies, Chips, Cookies, Sweet Pastries,  Store Bought Juices, Alcohol in Excess of  1-2 drinks a day, Artificial Sweeteners, Coffee Creamer, and "Sugar-free" Products. This will help patient to have stable blood glucose profile and potentially avoid unintended weight gain.   - I encouraged the patient to switch to  unprocessed or minimally processed complex starch and increased protein intake (animal or plant source), fruits, and vegetables.  - Patient is advised to stick to a routine mealtimes to eat 3 meals  a day and avoid unnecessary snacks ( to snack only to correct hypoglycemia).  - She declines referral to CDE for now.  - I have approached patient with the following individualized plan to manage diabetes and patient agrees:   -Her reported glycemic profile is questionable.   -She is approached  to monitor blood glucose properly and document properly for safe use of insulin.   -She is advised to continue Tresiba 90 units nightly, continue  NovoLog  15 units 3 times daily AC for pre-meal glucose readings greater than rebound.  Associated with monitoring of blood glucose 4 times a day before meals and at bedtime.   -Patient is encouraged to call clinic for blood glucose levels less than 70 or above 200 mg /dl. -Her renal function is back to normal.  She will continue to benefit from low-dose metformin therapy.  She is advised to continue Metformin 500 mg p.o. twice daily-after breakfast after supper.  -She would greatly benefit from CGM device.  She will be considered after  she documents her medical necessity for insulin and the need for multiple daily monitoring of blood glucose.  -  She has history of intolerance for [place], likely would not tolerate other  incretin therapy.  - Patient specific target  A1c;  LDL, HDL, Triglycerides, and  Waist Circumference were discussed in detail.  2) Lipids/HPL: Her recent lab showed controlled LDL of 80, generally improving from 141.  She is advised to continue atorvastatin 20 mg p.o. daily at bedtime.  -   Side effects and precautions discussed with her.  3) hypothyroidism:  -Her previsit thyroid function tests are consistent with under replacement.  I discussed and increase her levothyroxine to 100 mcg p.o. daily before breakfast.   - We discussed about the correct intake of her thyroid hormone, on empty stomach at fasting, with water, separated by at least 30 minutes from breakfast and other medications,  and separated by more than 4 hours from calcium, iron, multivitamins, acid reflux medications (PPIs). -Patient is made aware of the fact that thyroid hormone replacement is needed for life, dose to be adjusted by periodic monitoring of thyroid function tests.   - I advised patient to maintain close follow up with Heather Chroman, MD for primary  care needs.  - Patient Care Time Today:  25 min, of which >50% was spent in  counseling and the rest reviewing her  current and  previous labs/studies, previous treatments, her blood glucose readings, and medications' doses and developing a plan for long-term care based on the latest recommendations for standards of care.   [name] participated in the discussions, expressed understanding, and voiced agreement with the above plans.  All questions were answered to her satisfaction. she is encouraged to contact clinic should she have any questions or concerns prior to her return visit.   Follow up plan: She is advised to return in 3 months with repeat labs and meter/logs.  Glade Lloyd, MD Phone: [phone]  Fax: [phone]  This note was partially dictated with voice recognition software. Similar sounding words can be transcribed inadequately or may not  be corrected upon review.  12/27/2018, 1:59 PM

## 2019-08-04 DIAGNOSIS — Z299 Encounter for prophylactic measures, unspecified: Secondary | ICD-10-CM | POA: Diagnosis not present

## 2019-08-04 DIAGNOSIS — Z6835 Body mass index (BMI) 35.0-35.9, adult: Secondary | ICD-10-CM | POA: Diagnosis not present

## 2019-08-04 DIAGNOSIS — E1142 Type 2 diabetes mellitus with diabetic polyneuropathy: Secondary | ICD-10-CM | POA: Diagnosis not present

## 2019-08-04 DIAGNOSIS — E1165 Type 2 diabetes mellitus with hyperglycemia: Secondary | ICD-10-CM | POA: Diagnosis not present

## 2019-08-04 DIAGNOSIS — Z79899 Other long term (current) drug therapy: Secondary | ICD-10-CM | POA: Diagnosis not present

## 2019-08-04 DIAGNOSIS — M545 Low back pain: Secondary | ICD-10-CM | POA: Diagnosis not present

## 2019-08-04 DIAGNOSIS — I10 Essential (primary) hypertension: Secondary | ICD-10-CM | POA: Diagnosis not present

## 2019-08-30 ENCOUNTER — Other Ambulatory Visit: Payer: Self-pay | Admitting: "Endocrinology

## 2019-09-03 ENCOUNTER — Other Ambulatory Visit: Payer: Self-pay | Admitting: "Endocrinology

## 2019-10-20 ENCOUNTER — Other Ambulatory Visit: Payer: Self-pay | Admitting: Nurse Practitioner

## 2019-10-20 DIAGNOSIS — G8929 Other chronic pain: Secondary | ICD-10-CM

## 2019-10-20 DIAGNOSIS — M545 Low back pain, unspecified: Secondary | ICD-10-CM

## 2019-10-24 ENCOUNTER — Other Ambulatory Visit: Payer: Self-pay | Admitting: "Endocrinology

## 2019-10-28 ENCOUNTER — Inpatient Hospital Stay: Admission: RE | Admit: 2019-10-28 | Payer: Medicare Other | Source: Ambulatory Visit

## 2019-11-02 ENCOUNTER — Inpatient Hospital Stay: Admission: RE | Admit: 2019-11-02 | Payer: Medicare Other | Source: Ambulatory Visit

## 2019-11-03 ENCOUNTER — Ambulatory Visit: Payer: Medicare Other | Admitting: "Endocrinology

## 2019-11-09 ENCOUNTER — Ambulatory Visit (INDEPENDENT_AMBULATORY_CARE_PROVIDER_SITE_OTHER): Payer: Medicare Other | Admitting: "Endocrinology

## 2019-11-09 ENCOUNTER — Encounter: Payer: Self-pay | Admitting: "Endocrinology

## 2019-11-09 ENCOUNTER — Other Ambulatory Visit: Payer: Self-pay

## 2019-11-09 VITALS — BP 103/70 | HR 72 | Ht 62.0 in | Wt 172.0 lb

## 2019-11-09 DIAGNOSIS — E039 Hypothyroidism, unspecified: Secondary | ICD-10-CM | POA: Diagnosis not present

## 2019-11-09 DIAGNOSIS — Z79899 Other long term (current) drug therapy: Secondary | ICD-10-CM | POA: Diagnosis not present

## 2019-11-09 DIAGNOSIS — M545 Low back pain: Secondary | ICD-10-CM | POA: Diagnosis not present

## 2019-11-09 DIAGNOSIS — I10 Essential (primary) hypertension: Secondary | ICD-10-CM | POA: Diagnosis not present

## 2019-11-09 DIAGNOSIS — Z789 Other specified health status: Secondary | ICD-10-CM | POA: Diagnosis not present

## 2019-11-09 DIAGNOSIS — E1165 Type 2 diabetes mellitus with hyperglycemia: Secondary | ICD-10-CM | POA: Diagnosis not present

## 2019-11-09 DIAGNOSIS — Z6832 Body mass index (BMI) 32.0-32.9, adult: Secondary | ICD-10-CM | POA: Diagnosis not present

## 2019-11-09 DIAGNOSIS — IMO0002 Reserved for concepts with insufficient information to code with codable children: Secondary | ICD-10-CM

## 2019-11-09 DIAGNOSIS — E782 Mixed hyperlipidemia: Secondary | ICD-10-CM | POA: Diagnosis not present

## 2019-11-09 DIAGNOSIS — E118 Type 2 diabetes mellitus with unspecified complications: Secondary | ICD-10-CM

## 2019-11-09 DIAGNOSIS — Z794 Long term (current) use of insulin: Secondary | ICD-10-CM | POA: Diagnosis not present

## 2019-11-09 DIAGNOSIS — Z299 Encounter for prophylactic measures, unspecified: Secondary | ICD-10-CM | POA: Diagnosis not present

## 2019-11-09 LAB — HEMOGLOBIN A1C: Hemoglobin A1C: 7.7

## 2019-11-09 LAB — POCT GLYCOSYLATED HEMOGLOBIN (HGB A1C): Hemoglobin A1C: 7.1 % — AB (ref 4.0–5.6)

## 2019-11-09 NOTE — Patient Instructions (Signed)
                                     Advice for Weight Management  -For most of us the best way to lose weight is by diet management. Generally speaking, diet management means consuming less calories intentionally which over time brings about progressive weight loss.  This can be achieved more effectively by restricting carbohydrate consumption to the minimum possible.  So, it is critically important to know your numbers: how much calorie you are consuming and how much calorie you need. More importantly, our carbohydrates sources should be unprocessed or minimally processed complex starch food items.   Sometimes, it is important to balance nutrition by increasing protein intake (animal or plant source), fruits, and vegetables.  -Sticking to a routine mealtime to eat 3 meals a day and avoiding unnecessary snacks is shown to have a big role in weight control. Under normal circumstances, the only time we lose real weight is when we are hungry, so allow hunger to take place- hunger means no food between meal times, only water.  It is not advisable to starve.   -It is better to avoid simple carbohydrates including: Cakes, Sweet Desserts, Ice Cream, Soda (diet and regular), Sweet Tea, Candies, Chips, Cookies, Store Bought Juices, Alcohol in Excess of  1-2 drinks a day, Artificial Sweeteners, Doughnuts, Coffee Creamers, "Sugar-free" Products, etc, etc.  This is not a complete list.....    -Consulting with certified diabetes educators is proven to provide you with the most accurate and current information on diet.  Also, you may be  interested in discussing diet options/exchanges , we can schedule a visit with Penny Crumpton, RDN, CDE for individualized nutrition education.  -Exercise: If you are able: 30 -60 minutes a day ,4 days a week, or 150 minutes a week.  The longer the better.  Combine stretch, strength, and aerobic activities.  If you were told in the past that you  have high risk for cardiovascular diseases, you may seek evaluation by your heart doctor prior to initiating moderate to intense exercise programs.                                  Additional Care Considerations for Diabetes   -Diabetes  is a chronic disease.  The most important care consideration is regular follow-up with your diabetes care provider with the goal being avoiding or delaying its complications and to take advantage of advances in medications and technology.    -Type 2 diabetes is known to coexist with other important comorbidities such as high blood pressure and high cholesterol.  It is critical to control not only the diabetes but also the high blood pressure and high cholesterol to minimize and delay the risk of complications including coronary artery disease, stroke, amputations, blindness, etc.    - Studies showed that people with diabetes will benefit from a class of medications known as ACE inhibitors and statins.  Unless there are specific reasons not to be on these medications, the standard of care is to consider getting one from these groups of medications at an optimal doses.  These medications are generally considered safe and proven to help protect the heart and the kidneys.    - People with diabetes are encouraged to initiate and maintain regular follow-up with eye doctors, foot doctors, dentists ,   and if necessary heart and kidney doctors.     - It is highly recommended that people with diabetes quit smoking or stay away from smoking, and get yearly  flu vaccine and pneumonia vaccine at least every 5 years.  One other important lifestyle recommendation is to ensure adequate sleep - at least 6-7 hours of uninterrupted sleep at night.  -Exercise: If you are able: 30 -60 minutes a day, 4 days a week, or 150 minutes a week.  The longer the better.  Combine stretch, strength, and aerobic activities.  If you were told in the past that you have high risk for cardiovascular  diseases, you may seek evaluation by your heart doctor prior to initiating moderate to intense exercise programs.     COVID-19 Vaccine Information can be found at: [email or web address] For questions related to vaccine distribution or appointments, please email [email or web address] or call [phone].        

## 2019-11-09 NOTE — Progress Notes (Signed)
11/09/2019     Endocrinology follow-up note    Subjective:    Patient ID: [name], female    DOB: 11-Jun-1961.  She is being seen in follow-up for  management of diabetes,  hyperlipidemia, hypothyroidism during the coronavirus pandemic.    PMD: Glenda Chroman, MD  Past Medical History:  Diagnosis Date  . Anxiety disorder   . Chronic low back pain   . Cirrhosis of liver ([place])   . Colitis   . Diabetes mellitus    Type II  . Diabetic neuropathy ([place])   . Diverticulitis   . Fatty liver   . GERD (gastroesophageal reflux disease)   . High cholesterol   . Hypertension   . Irritable bowel syndrome   . Meniere's disease   . Neuropathy   . Pain    Past Surgical History:  Procedure Laterality Date  . ABDOMINAL HYSTERECTOMY    . BIOPSY  03/20/2017   Procedure: BIOPSY;  Surgeon: [name], MD;  [facility];  Service: Endoscopy;;  colon gastric  . bladder tact  2005  . CHOLECYSTECTOMY  08/11  . COLONOSCOPY  2208  . COLONOSCOPY  In of September 2014   @ [facility]/Dr,.[name]  . COLONOSCOPY WITH PROPOFOL N/A 03/20/2017   Procedure: COLONOSCOPY WITH PROPOFOL;  Surgeon: [name], MD;  [facility];  Service: Endoscopy;  Laterality: N/A;  . ECTOPIC PREGNANCY SURGERY  1996  . ESOPHAGOGASTRODUODENOSCOPY (EGD) WITH PROPOFOL N/A 03/20/2017   Procedure: ESOPHAGOGASTRODUODENOSCOPY (EGD) WITH PROPOFOL;  Surgeon: [name], MD;  [facility];  Service: Endoscopy;  Laterality: N/A;  12:45  . HERNIA REPAIR  09/17/11  . LIGAMENT REPAIR Left 02/10/2018   Procedure: Left  ANKLE ATFL Repair and Ligament Augmentation, Injection of Tendon Splint Application;  Surgeon: [name], DPM;  Location: [facility];  Service: Podiatry;  Laterality: Left;  . [place]  2009   Preformed on the right shoulder  . OVARIAN CYST REMOVAL     right side  . PARTIAL HYSTERECTOMY  2005  . POLYPECTOMY  03/20/2017   Procedure: POLYPECTOMY;  Surgeon: [name], MD;   [facility];  Service: Endoscopy;;  colon  . TENDON REPAIR Left 02/10/2018   Procedure: PERONEAL TENDON REPAIR and Synovectomy Peroneal Tendon;  Surgeon: [name], DPM;  Location: [facility];  Service: Podiatry;  Laterality: Left;  . UPPER GASTROINTESTINAL ENDOSCOPY  2008  . URETERAL STENT PLACEMENT     Social History   Socioeconomic History  . Marital status: Divorced    Spouse name: Not on file  . Number of children: Not on file  . Years of education: Not on file  . Highest education level: Not on file  Occupational History  . Not on file  Social Needs  . Financial resource strain: Not on file  . Food insecurity:    Worry: Not on file    Inability: Not on file  . Transportation needs:    Medical: Not on file    Non-medical: Not on file  Tobacco Use  . Smoking status: Never Smoker  . Smokeless tobacco: Never Used  Substance and Sexual Activity  . Alcohol use: No  . Drug use: No  . Sexual activity: Yes    Birth control/protection: Surgical  Lifestyle  . Physical activity:    Days per week: Not on file    Minutes per session: Not on file  . Stress: Not on file  Relationships  .  Social connections:    Talks on phone: Not on file    Gets together: Not on file    Attends religious service: Not on file    Active member of club or organization: Not on file    Attends meetings of clubs or organizations: Not on file    Relationship status: Not on file  Other Topics Concern  . Not on file  Social History Narrative  . Not on file   Current Outpatient Medications on File Prior to Visit  Medication Sig Dispense Refill  . acetaZOLAMIDE (DIAMOX) 250 MG tablet Take 250 mg by mouth daily.     Marland Kitchen acyclovir (ZOVIRAX) 400 MG tablet Take 400 mg by mouth 3 (three) times daily as needed (for fever blisters).     Marland Kitchen atorvastatin (LIPITOR) 20 MG tablet TAKE ONE TABLET BY MOUTH DAILY. 90 tablet 1  . chlorhexidine (PERIDEX) 0.12 % solution as needed.     . Cholecalciferol  (VITAMIN D3) 2000 units capsule Take 4,000 Units by mouth daily.     . Continuous Blood Gluc Sensor (FREESTYLE LIBRE 14 DAY SENSOR) MISC Inject 1 each into the skin every 14 (fourteen) days. Use as directed. (Patient not taking: Reported on 05/31/2019) 2 each 2  . cyanocobalamin (,VITAMIN B-12,) 1000 MCG/ML injection Inject 1,000 mcg into the muscle every 30 (thirty) days.  5  . diazepam (VALIUM) 2 MG tablet Take 2 mg by mouth every 12 (twelve) hours as needed for anxiety.     . dicyclomine (BENTYL) 20 MG tablet TAKE ONE TABLET BY MOUTH THREE TIMES DAILY 90 tablet 5  . DULoxetine (CYMBALTA) 60 MG capsule Take 60 mg by mouth daily.    Marland Kitchen estradiol (ESTRACE) 0.5 MG tablet Take 0.5 mg by mouth every evening.     . fluconazole (DIFLUCAN) 150 MG tablet Take 150 mg by mouth as needed.     . fluticasone (FLONASE) 50 MCG/ACT nasal spray Place 2 sprays into both nostrils every morning.     Marland Kitchen glucose blood (FREESTYLE LITE) test strip Use as instructed (Patient not taking: Reported on 05/31/2019) 100 each 2  . hydrochlorothiazide (HYDRODIURIL) 25 MG tablet Take 25 mg by mouth daily.     Marland Kitchen HYDROcodone-acetaminophen (NORCO/VICODIN) 5-325 MG tablet Take 1 tablet by mouth every 6 (six) hours as needed for severe pain. 12 tablet 0  . Influenza Vac Subunit Quad (FLUCELVAX QUADRIVALENT) 0.5 ML SUSY Flucelvax Quad 2019-2020 (PF) 60 mcg (15 mcg x 4)/0.5 mL IM syringe  INJECT 0.5ML INTRAMUSCULARLY ONCE.    Marland Kitchen insulin aspart (NOVOLOG FLEXPEN) 100 UNIT/ML FlexPen Inject 15 Units into the skin 3 (three) times daily with meals. 30 mL 2  . levothyroxine (SYNTHROID) 100 MCG tablet Take 1 tablet (100 mcg total) by mouth daily before breakfast. 30 tablet 6  . metFORMIN (GLUCOPHAGE) 500 MG tablet TAKE ONE TABLET BY MOUTH TWICE DAILY. TAKE WITH A MEAL. 180 tablet 1  . metoprolol (LOPRESSOR) 50 MG tablet Take 25 mg by mouth daily.     . ondansetron (ZOFRAN) 4 MG tablet Take 1 tablet (4 mg total) by mouth every 8 (eight) hours as  needed for nausea or vomiting. 12 tablet 0  . pantoprazole (PROTONIX) 40 MG tablet 40 mg daily.   4  . PARoxetine (PAXIL) 40 MG tablet Take 20 mg by mouth daily.     Vladimir Faster Glycol-Propyl Glycol (SYSTANE OP) Place 1 drop into both eyes daily.    Marland Kitchen PRODIGY TWIST TOP LANCETS 28G MISC TEST TWICE DAILY  200 each 2  . tiZANidine (ZANAFLEX) 4 MG tablet Take 4 mg by mouth. Patient states that she takes as needed    . TRESIBA FLEXTOUCH 200 UNIT/ML SOPN INJECT 90 UNITS INTO THE SKIN AT BEDTIME. 9 mL 2  . Turmeric 500 MG CAPS Take 500 mg by mouth daily.      No current facility-administered medications on file prior to visit.     No facility-administered encounter medications on file as of 12/27/2018.    ALLERGIES: Allergies  Allergen Reactions  . Flagyl [Metronidazole Hcl] Itching and Rash  . Nsaids Nausea Only   VACCINATION STATUS: Immunization History  Administered Date(s) Administered  . Influenza Inj Mdck Quad Pf 08/07/2017, 08/11/2018    Diabetes  She  has chronically uncontrolled, symptomatic, complicated type 2 diabetes mellitus. Onset time: She was diagnosed approximately at age 8 years.  Returns with improved glycemic profile, point-of-care A1c today 7.7%, generally improving from 14.4%.   She is utilizing a CGM device showing average blood glucose of 135 mg per DL.  She is timely range 74%, above range 18%, mild hypoglycemia 8%.    She is following a generally unhealthy diet. When asked about meal planning, she reported none. She has not had a previous visit with a dietitian. She never participates in exercise.   For her hypothyroidism, she is on levothyroxine 75 mcg p.o. nightly.  She reports compliance.  She has no new complaints.   Hyperlipidemia  This is a chronic problem. The current episode started more than 1 year ago. The problem is controlled. Exacerbating diseases include diabetes and obesity. Pertinent negatives include no chest pain, myalgias or shortness of  breath. Current antihyperlipidemic treatment includes statins. Risk factors for coronary artery disease include diabetes mellitus, dyslipidemia, obesity, a sedentary lifestyle and post-menopausal.    Review of systems:   Review of systems  Constitutional: + Minimally fluctuating body weight,  current  Body mass index is 31.46 kg/m. , no fatigue, no subjective hyperthermia, no subjective hypothermia Eyes: no blurry vision, no xerophthalmia ENT: no sore throat, no nodules palpated in throat, no dysphagia/odynophagia, no hoarseness Cardiovascular: no Chest Pain, no Shortness of Breath, no palpitations, no leg swelling Respiratory: no cough, no shortness of breath Gastrointestinal: no Nausea/Vomiting/Diarhhea Musculoskeletal: no muscle/joint aches Skin: no rashes Neurological: no tremors, no numbness, no tingling, no dizziness Psychiatric: no depression, no anxiety   Objective:     Physical Exam- Limited  Constitutional:  Body mass index is 31.46 kg/m. , not in acute distress, normal state of mind Eyes:  EOMI, no exophthalmos Neck: Supple Respiratory: Adequate breathing efforts Musculoskeletal: no gross deformities, strength intact in all four extremities, no gross restriction of joint movements Skin:  no rashes, no hyperemia Neurological: no tremor with outstretched hands.  Recent Results (from the past 2160 hour(s))  Hemoglobin A1c     Status: None   Collection Time: 11/09/19 12:00 AM  Result Value Ref Range   Hemoglobin A1C 7.7   HgB A1c     Status: Abnormal   Collection Time: 11/09/19  2:16 PM  Result Value Ref Range   Hemoglobin A1C 7.1 (A) 4.0 - 5.6 %   HbA1c POC (<> result, manual entry)     HbA1c, POC (prediabetic range)     HbA1c, POC (controlled diabetic range)      Lipid Panel     Component Value Date/Time   CHOL 146 10/13/2017 0816   TRIG 90 10/13/2017 0816   HDL 49 (L) 10/13/2017 3244  CHOLHDL 3.0 10/13/2017 0816   VLDL 29 06/30/2016 1251   LDLCALC 80  10/13/2017 0816    Assessment & Plan:   1. Uncontrolled type 2 diabetes mellitus with complication, with long-term current use of insulin  - Patient has currently uncontrolled symptomatic type 2 DM since  59 years of age. Libby Maw with significantly improved glycemic profile, A1c of 7.7%, generally improving from 12.4%.      Recent labs reviewed.   Her diabetes is complicated by obesity, insulin resistance, and patient remains at a high risk for more acute and chronic complications of diabetes which include CAD, CVA, CKD, retinopathy, and neuropathy. These are all discussed in detail with the patient.  - I have counseled the patient on diet management and weight loss, by adopting a carbohydrate restricted/protein rich diet.  - she  admits there is a room for improvement in her diet and drink choices. -  Suggestion is made for her to avoid simple carbohydrates  from her diet including Cakes, Sweet Desserts / Pastries, Ice Cream, Soda (diet and regular), Sweet Tea, Candies, Chips, Cookies, Sweet Pastries,  Store Bought Juices, Alcohol in Excess of  1-2 drinks a day, Artificial Sweeteners, Coffee Creamer, and "Sugar-free" Products. This will help patient to have stable blood glucose profile and potentially avoid unintended weight gain.   - I encouraged the patient to switch to  unprocessed or minimally processed complex starch and increased protein intake (animal or plant source), fruits, and vegetables.  - Patient is advised to stick to a routine mealtimes to eat 3 meals  a day and avoid unnecessary snacks ( to snack only to correct hypoglycemia).  - She declines referral to CDE for now.  - I have approached patient with the following individualized plan to manage diabetes and patient agrees:    -She is approached to continue to document blood glucose profile and insulin administrations properly.     -She is advised to continue Tresiba 90 units nightly, continue NovoLog  15 units 3 times  daily AC for pre-meal glucose readings greater than 90 mg per DL.  She is allowed to use correction dose NovoLog for readings above 150 mg per DL.    -Patient is encouraged to call clinic for blood glucose levels less than 70 or above 200 mg /dl. -Her renal function is back to normal.  She will continue to benefit from low-dose metformin therapy.  She is advised to continue Metformin 500 mg p.o. twice daily-after breakfast after supper with adequate hydration.  She is advised to wear her continuous glucose monitoring device at all times. -  She has history of intolerance for [place], likely would not tolerate other  incretin therapy.  - Patient specific target  A1c;  LDL, HDL, Triglycerides, and  Waist Circumference were discussed in detail.  2) Lipids/HPL: Her recent lab showed controlled LDL of 80, generally improving from 141.  She is advised to continue atorvastatin 20 mg p.o. daily at bedtime.    -   Side effects and precautions discussed with her.  3) hypothyroidism:  -Her previsit thyroid function tests are consistent with under replacement.  I discussed and increase her levothyroxine to 100 mcg p.o. daily before breakfast.   - We discussed about the correct intake of her thyroid hormone, on empty stomach at fasting, with water, separated by at least 30 minutes from breakfast and other medications,  and separated by more than 4 hours from calcium, iron, multivitamins, acid reflux medications (PPIs). -Patient is made aware  of the fact that thyroid hormone replacement is needed for life, dose to be adjusted by periodic monitoring of thyroid function tests.    - I advised patient to maintain close follow up with Glenda Chroman, MD for primary care needs.  -- Time spent on this patient care encounter:  35 min, of which > 50% was spent in  counseling and the rest reviewing her blood glucose logs , discussing her hypoglycemia and hyperglycemia episodes, reviewing her current and  previous labs  / studies  ( including abstraction from other facilities) and medications  doses and developing a  long term treatment plan and documenting her care.   Please refer to Patient Instructions for Blood Glucose Monitoring and Insulin/Medications Dosing Guide"  in media tab for additional information. Please  also refer to " Patient Self Inventory" in the Media  tab for reviewed elements of pertinent patient history.  [name] participated in the discussions, expressed understanding, and voiced agreement with the above plans.  All questions were answered to her satisfaction. she is encouraged to contact clinic should she have any questions or concerns prior to her return visit.    Follow up plan: She is advised to return in 3 months with repeat labs and meter/logs.  Glade Lloyd, MD Phone: [phone]  Fax: [phone]  This note was partially dictated with voice recognition software. Similar sounding words can be transcribed inadequately or may not  be corrected upon review.  12/27/2018, 1:59 PM

## 2019-11-15 ENCOUNTER — Ambulatory Visit
Admission: RE | Admit: 2019-11-15 | Discharge: 2019-11-15 | Disposition: A | Discharge status: Home / Self Care | Payer: Medicare Other | Source: Ambulatory Visit | Attending: Nurse Practitioner | Admitting: Nurse Practitioner

## 2019-11-15 ENCOUNTER — Other Ambulatory Visit: Payer: Self-pay

## 2019-11-15 DIAGNOSIS — M545 Low back pain, unspecified: Secondary | ICD-10-CM

## 2019-11-15 DIAGNOSIS — G8929 Other chronic pain: Secondary | ICD-10-CM

## 2019-11-15 MED ORDER — IOPAMIDOL (ISOVUE-M 200) INJECTION 41%
1.0000 mL | Freq: Once | INTRAMUSCULAR | Status: AC
  Administered 2019-11-15: 1 mL via EPIDURAL

## 2019-11-15 MED ORDER — METHYLPREDNISOLONE ACETATE 40 MG/ML INJ SUSP (RADIOLOG
120.0000 mg | Freq: Once | INTRAMUSCULAR | Status: AC
  Administered 2019-11-15: 120 mg via EPIDURAL

## 2019-11-21 ENCOUNTER — Other Ambulatory Visit: Payer: Self-pay | Admitting: "Endocrinology

## 2019-11-30 ENCOUNTER — Other Ambulatory Visit (INDEPENDENT_AMBULATORY_CARE_PROVIDER_SITE_OTHER): Payer: Self-pay | Admitting: *Deleted

## 2019-11-30 DIAGNOSIS — K7460 Unspecified cirrhosis of liver: Secondary | ICD-10-CM

## 2019-12-01 DIAGNOSIS — Z79899 Other long term (current) drug therapy: Secondary | ICD-10-CM | POA: Diagnosis not present

## 2019-12-02 ENCOUNTER — Encounter (INDEPENDENT_AMBULATORY_CARE_PROVIDER_SITE_OTHER): Payer: Self-pay | Admitting: *Deleted

## 2019-12-06 ENCOUNTER — Ambulatory Visit (INDEPENDENT_AMBULATORY_CARE_PROVIDER_SITE_OTHER): Payer: Medicare Other | Admitting: Internal Medicine

## 2019-12-08 DIAGNOSIS — E11319 Type 2 diabetes mellitus with unspecified diabetic retinopathy without macular edema: Secondary | ICD-10-CM | POA: Diagnosis not present

## 2019-12-12 ENCOUNTER — Other Ambulatory Visit (INDEPENDENT_AMBULATORY_CARE_PROVIDER_SITE_OTHER): Payer: Self-pay | Admitting: *Deleted

## 2019-12-12 DIAGNOSIS — K760 Fatty (change of) liver, not elsewhere classified: Secondary | ICD-10-CM

## 2019-12-12 DIAGNOSIS — K7460 Unspecified cirrhosis of liver: Secondary | ICD-10-CM

## 2019-12-12 DIAGNOSIS — K7581 Nonalcoholic steatohepatitis (NASH): Secondary | ICD-10-CM

## 2019-12-16 ENCOUNTER — Ambulatory Visit (HOSPITAL_COMMUNITY): Payer: Medicare Other | Attending: Internal Medicine

## 2019-12-21 DIAGNOSIS — I10 Essential (primary) hypertension: Secondary | ICD-10-CM | POA: Diagnosis not present

## 2019-12-21 DIAGNOSIS — M159 Polyosteoarthritis, unspecified: Secondary | ICD-10-CM | POA: Diagnosis not present

## 2019-12-21 DIAGNOSIS — E119 Type 2 diabetes mellitus without complications: Secondary | ICD-10-CM | POA: Diagnosis not present

## 2019-12-27 DIAGNOSIS — Z79899 Other long term (current) drug therapy: Secondary | ICD-10-CM | POA: Diagnosis not present

## 2019-12-27 DIAGNOSIS — I10 Essential (primary) hypertension: Secondary | ICD-10-CM | POA: Diagnosis not present

## 2019-12-27 DIAGNOSIS — M545 Low back pain: Secondary | ICD-10-CM | POA: Diagnosis not present

## 2019-12-27 DIAGNOSIS — F331 Major depressive disorder, recurrent, moderate: Secondary | ICD-10-CM | POA: Diagnosis not present

## 2019-12-27 DIAGNOSIS — Z299 Encounter for prophylactic measures, unspecified: Secondary | ICD-10-CM | POA: Diagnosis not present

## 2019-12-27 DIAGNOSIS — E1165 Type 2 diabetes mellitus with hyperglycemia: Secondary | ICD-10-CM | POA: Diagnosis not present

## 2019-12-28 ENCOUNTER — Other Ambulatory Visit: Payer: Self-pay

## 2019-12-28 ENCOUNTER — Ambulatory Visit (HOSPITAL_COMMUNITY)
Admission: RE | Admit: 2019-12-28 | Discharge: 2019-12-28 | Disposition: A | Discharge status: Home / Self Care | Payer: Medicare Other | Source: Ambulatory Visit | Attending: Internal Medicine | Admitting: Internal Medicine

## 2019-12-28 DIAGNOSIS — K7460 Unspecified cirrhosis of liver: Secondary | ICD-10-CM | POA: Insufficient documentation

## 2019-12-28 DIAGNOSIS — K7581 Nonalcoholic steatohepatitis (NASH): Secondary | ICD-10-CM | POA: Diagnosis not present

## 2019-12-28 DIAGNOSIS — K760 Fatty (change of) liver, not elsewhere classified: Secondary | ICD-10-CM

## 2019-12-29 LAB — AFP TUMOR MARKER: AFP-Tumor Marker: 3.7 ng/mL

## 2020-01-24 ENCOUNTER — Other Ambulatory Visit: Payer: Self-pay | Admitting: Nurse Practitioner

## 2020-01-24 DIAGNOSIS — Z794 Long term (current) use of insulin: Secondary | ICD-10-CM | POA: Diagnosis not present

## 2020-01-24 DIAGNOSIS — E118 Type 2 diabetes mellitus with unspecified complications: Secondary | ICD-10-CM | POA: Diagnosis not present

## 2020-01-24 DIAGNOSIS — E1165 Type 2 diabetes mellitus with hyperglycemia: Secondary | ICD-10-CM | POA: Diagnosis not present

## 2020-01-24 DIAGNOSIS — E039 Hypothyroidism, unspecified: Secondary | ICD-10-CM | POA: Diagnosis not present

## 2020-01-24 DIAGNOSIS — M545 Low back pain, unspecified: Secondary | ICD-10-CM

## 2020-01-24 DIAGNOSIS — G8929 Other chronic pain: Secondary | ICD-10-CM

## 2020-01-25 DIAGNOSIS — I10 Essential (primary) hypertension: Secondary | ICD-10-CM | POA: Diagnosis not present

## 2020-01-25 DIAGNOSIS — Z299 Encounter for prophylactic measures, unspecified: Secondary | ICD-10-CM | POA: Diagnosis not present

## 2020-01-25 DIAGNOSIS — E1165 Type 2 diabetes mellitus with hyperglycemia: Secondary | ICD-10-CM | POA: Diagnosis not present

## 2020-01-25 DIAGNOSIS — E1142 Type 2 diabetes mellitus with diabetic polyneuropathy: Secondary | ICD-10-CM | POA: Diagnosis not present

## 2020-01-25 DIAGNOSIS — Z79899 Other long term (current) drug therapy: Secondary | ICD-10-CM | POA: Diagnosis not present

## 2020-01-25 DIAGNOSIS — M545 Low back pain: Secondary | ICD-10-CM | POA: Diagnosis not present

## 2020-01-25 DIAGNOSIS — F419 Anxiety disorder, unspecified: Secondary | ICD-10-CM | POA: Diagnosis not present

## 2020-01-25 LAB — COMPLETE METABOLIC PANEL WITH GFR
AG Ratio: 1 (calc) (ref 1.0–2.5)
ALT: 25 U/L (ref 6–29)
AST: 26 U/L (ref 10–35)
Albumin: 3.7 g/dL (ref 3.6–5.1)
Alkaline phosphatase (APISO): 101 U/L (ref 37–153)
BUN: 23 mg/dL (ref 7–25)
CO2: 27 mmol/L (ref 20–32)
Calcium: 9.4 mg/dL (ref 8.6–10.4)
Chloride: 105 mmol/L (ref 98–110)
Creat: 0.89 mg/dL (ref 0.50–1.05)
GFR, Est African American: 83 mL/min/{1.73_m2} (ref >=60)
GFR, Est Non African American: 71 mL/min/{1.73_m2} (ref >=60)
Globulin: 3.6 g/dL (calc) (ref 1.9–3.7)
Glucose, Bld: 43 mg/dL — ABNORMAL LOW (ref 65–99)
Potassium: 3.8 mmol/L (ref 3.5–5.3)
Sodium: 141 mmol/L (ref 135–146)
Total Bilirubin: 0.5 mg/dL (ref 0.2–1.2)
Total Protein: 7.3 g/dL (ref 6.1–8.1)

## 2020-01-25 LAB — MICROALBUMIN / CREATININE URINE RATIO
Creatinine, Urine: 148 mg/dL (ref 20–275)
Microalb Creat Ratio: 3 mcg/mg creat (ref <30)
Microalb, Ur: 0.5 mg/dL

## 2020-01-25 LAB — TSH: TSH: 0.53 mIU/L (ref 0.40–4.50)

## 2020-01-25 LAB — LIPID PANEL
Cholesterol: 165 mg/dL (ref <200)
HDL: 49 mg/dL — ABNORMAL LOW (ref >=50)
LDL Cholesterol (Calc): 96 mg/dL (calc)
Non-HDL Cholesterol (Calc): 116 mg/dL (calc) (ref <130)
Total CHOL/HDL Ratio: 3.4 (calc) (ref <5.0)
Triglycerides: 107 mg/dL (ref <150)

## 2020-01-25 LAB — T4, FREE: Free T4: 1.3 ng/dL (ref 0.8–1.8)

## 2020-01-25 LAB — VITAMIN D 25 HYDROXY (VIT D DEFICIENCY, FRACTURES): Vit D, 25-Hydroxy: 47 ng/mL (ref 30–100)

## 2020-01-31 ENCOUNTER — Ambulatory Visit
Admission: RE | Admit: 2020-01-31 | Discharge: 2020-01-31 | Disposition: A | Discharge status: Home / Self Care | Payer: Medicare Other | Source: Ambulatory Visit | Attending: Nurse Practitioner | Admitting: Nurse Practitioner

## 2020-01-31 ENCOUNTER — Other Ambulatory Visit: Payer: Self-pay

## 2020-01-31 DIAGNOSIS — M545 Low back pain, unspecified: Secondary | ICD-10-CM

## 2020-01-31 DIAGNOSIS — G8929 Other chronic pain: Secondary | ICD-10-CM

## 2020-01-31 MED ORDER — METHYLPREDNISOLONE ACETATE 40 MG/ML INJ SUSP (RADIOLOG
120.0000 mg | Freq: Once | INTRAMUSCULAR | Status: AC
  Administered 2020-01-31: 120 mg via EPIDURAL

## 2020-01-31 MED ORDER — IOPAMIDOL (ISOVUE-M 200) INJECTION 41%
1.0000 mL | Freq: Once | INTRAMUSCULAR | Status: AC
  Administered 2020-01-31: 1 mL via EPIDURAL

## 2020-02-07 ENCOUNTER — Ambulatory Visit: Payer: Medicare Other | Admitting: "Endocrinology

## 2020-02-08 ENCOUNTER — Other Ambulatory Visit (INDEPENDENT_AMBULATORY_CARE_PROVIDER_SITE_OTHER): Payer: Self-pay | Admitting: Internal Medicine

## 2020-02-13 DIAGNOSIS — K529 Noninfective gastroenteritis and colitis, unspecified: Secondary | ICD-10-CM | POA: Diagnosis not present

## 2020-02-13 DIAGNOSIS — D72829 Elevated white blood cell count, unspecified: Secondary | ICD-10-CM | POA: Diagnosis not present

## 2020-02-13 DIAGNOSIS — R1031 Right lower quadrant pain: Secondary | ICD-10-CM | POA: Diagnosis not present

## 2020-02-13 DIAGNOSIS — R1030 Lower abdominal pain, unspecified: Secondary | ICD-10-CM | POA: Diagnosis not present

## 2020-02-22 ENCOUNTER — Other Ambulatory Visit: Payer: Self-pay | Admitting: "Endocrinology

## 2020-02-24 DIAGNOSIS — E1165 Type 2 diabetes mellitus with hyperglycemia: Secondary | ICD-10-CM | POA: Diagnosis not present

## 2020-02-24 DIAGNOSIS — E1142 Type 2 diabetes mellitus with diabetic polyneuropathy: Secondary | ICD-10-CM | POA: Diagnosis not present

## 2020-02-24 DIAGNOSIS — Z79899 Other long term (current) drug therapy: Secondary | ICD-10-CM | POA: Diagnosis not present

## 2020-02-24 DIAGNOSIS — I10 Essential (primary) hypertension: Secondary | ICD-10-CM | POA: Diagnosis not present

## 2020-02-24 DIAGNOSIS — M25569 Pain in unspecified knee: Secondary | ICD-10-CM | POA: Diagnosis not present

## 2020-02-24 DIAGNOSIS — Z299 Encounter for prophylactic measures, unspecified: Secondary | ICD-10-CM | POA: Diagnosis not present

## 2020-02-24 DIAGNOSIS — F331 Major depressive disorder, recurrent, moderate: Secondary | ICD-10-CM | POA: Diagnosis not present

## 2020-03-02 ENCOUNTER — Other Ambulatory Visit: Payer: Self-pay | Admitting: "Endocrinology

## 2020-03-08 DIAGNOSIS — K5792 Diverticulitis of intestine, part unspecified, without perforation or abscess without bleeding: Secondary | ICD-10-CM | POA: Diagnosis not present

## 2020-03-08 DIAGNOSIS — Z299 Encounter for prophylactic measures, unspecified: Secondary | ICD-10-CM | POA: Diagnosis not present

## 2020-03-08 DIAGNOSIS — R0602 Shortness of breath: Secondary | ICD-10-CM | POA: Diagnosis not present

## 2020-03-08 DIAGNOSIS — M79606 Pain in leg, unspecified: Secondary | ICD-10-CM | POA: Diagnosis not present

## 2020-03-08 DIAGNOSIS — I10 Essential (primary) hypertension: Secondary | ICD-10-CM | POA: Diagnosis not present

## 2020-03-12 ENCOUNTER — Other Ambulatory Visit: Payer: Self-pay | Admitting: "Endocrinology

## 2020-03-15 ENCOUNTER — Ambulatory Visit (INDEPENDENT_AMBULATORY_CARE_PROVIDER_SITE_OTHER): Payer: Medicare Other | Admitting: Podiatry

## 2020-03-15 DIAGNOSIS — Z5329 Procedure and treatment not carried out because of patient's decision for other reasons: Secondary | ICD-10-CM

## 2020-03-15 NOTE — Progress Notes (Signed)
No show for appt. 

## 2020-03-19 ENCOUNTER — Telehealth: Payer: Self-pay | Admitting: "Endocrinology

## 2020-03-19 NOTE — Telephone Encounter (Signed)
Rena with [employer] left a VM that she received the [employer] form this morning but it was marked invalid because it says " G Nida " it has to be signed his full name first and last. Please re fax at [phone]

## 2020-03-19 NOTE — Telephone Encounter (Signed)
Re-faxed.

## 2020-03-26 ENCOUNTER — Other Ambulatory Visit: Payer: Self-pay | Admitting: "Endocrinology

## 2020-04-02 ENCOUNTER — Ambulatory Visit: Payer: Medicare Other | Admitting: "Endocrinology

## 2020-04-10 ENCOUNTER — Other Ambulatory Visit: Payer: Self-pay | Admitting: Nurse Practitioner

## 2020-04-10 DIAGNOSIS — G8929 Other chronic pain: Secondary | ICD-10-CM

## 2020-04-10 DIAGNOSIS — M545 Low back pain, unspecified: Secondary | ICD-10-CM

## 2020-04-17 ENCOUNTER — Telehealth: Payer: Self-pay | Admitting: "Endocrinology

## 2020-04-18 ENCOUNTER — Other Ambulatory Visit: Payer: Self-pay

## 2020-04-18 DIAGNOSIS — IMO0002 Reserved for concepts with insufficient information to code with codable children: Secondary | ICD-10-CM

## 2020-04-18 MED ORDER — NOVOLOG FLEXPEN 100 UNIT/ML ~~LOC~~ SOPN: PEN_INJECTOR | SUBCUTANEOUS | 0 refills | Status: DC

## 2020-04-18 NOTE — Telephone Encounter (Signed)
Pt has been scheduled for 8/4. Can you send her in enough to get her to that appt?

## 2020-04-18 NOTE — Telephone Encounter (Signed)
Sent in

## 2020-04-19 ENCOUNTER — Other Ambulatory Visit: Payer: Self-pay

## 2020-04-19 ENCOUNTER — Ambulatory Visit
Admission: RE | Admit: 2020-04-19 | Discharge: 2020-04-19 | Disposition: A | Discharge status: Home / Self Care | Payer: Medicare Other | Source: Ambulatory Visit | Attending: Nurse Practitioner | Admitting: Nurse Practitioner

## 2020-04-19 DIAGNOSIS — M545 Low back pain, unspecified: Secondary | ICD-10-CM

## 2020-04-19 DIAGNOSIS — G8929 Other chronic pain: Secondary | ICD-10-CM

## 2020-04-19 MED ORDER — METHYLPREDNISOLONE ACETATE 40 MG/ML INJ SUSP (RADIOLOG
120.0000 mg | Freq: Once | INTRAMUSCULAR | Status: AC
  Administered 2020-04-19: 120 mg via EPIDURAL

## 2020-04-19 MED ORDER — IOPAMIDOL (ISOVUE-M 200) INJECTION 41%
1.0000 mL | Freq: Once | INTRAMUSCULAR | Status: AC
  Administered 2020-04-19: 1 mL via EPIDURAL

## 2020-04-19 NOTE — Discharge Instructions (Signed)

## 2020-04-23 DIAGNOSIS — R0602 Shortness of breath: Secondary | ICD-10-CM | POA: Diagnosis not present

## 2020-04-25 ENCOUNTER — Other Ambulatory Visit: Payer: Self-pay | Admitting: "Endocrinology

## 2020-04-27 DIAGNOSIS — N390 Urinary tract infection, site not specified: Secondary | ICD-10-CM | POA: Diagnosis not present

## 2020-04-30 DIAGNOSIS — Z299 Encounter for prophylactic measures, unspecified: Secondary | ICD-10-CM | POA: Diagnosis not present

## 2020-04-30 DIAGNOSIS — R0600 Dyspnea, unspecified: Secondary | ICD-10-CM | POA: Diagnosis not present

## 2020-04-30 DIAGNOSIS — E1142 Type 2 diabetes mellitus with diabetic polyneuropathy: Secondary | ICD-10-CM | POA: Diagnosis not present

## 2020-04-30 DIAGNOSIS — E1165 Type 2 diabetes mellitus with hyperglycemia: Secondary | ICD-10-CM | POA: Diagnosis not present

## 2020-04-30 DIAGNOSIS — I10 Essential (primary) hypertension: Secondary | ICD-10-CM | POA: Diagnosis not present

## 2020-04-30 DIAGNOSIS — K7460 Unspecified cirrhosis of liver: Secondary | ICD-10-CM | POA: Diagnosis not present

## 2020-04-30 DIAGNOSIS — Z6832 Body mass index (BMI) 32.0-32.9, adult: Secondary | ICD-10-CM | POA: Diagnosis not present

## 2020-05-04 DIAGNOSIS — M159 Polyosteoarthritis, unspecified: Secondary | ICD-10-CM | POA: Diagnosis not present

## 2020-05-04 DIAGNOSIS — I10 Essential (primary) hypertension: Secondary | ICD-10-CM | POA: Diagnosis not present

## 2020-05-04 DIAGNOSIS — E119 Type 2 diabetes mellitus without complications: Secondary | ICD-10-CM | POA: Diagnosis not present

## 2020-05-09 ENCOUNTER — Other Ambulatory Visit: Payer: Self-pay

## 2020-05-09 ENCOUNTER — Ambulatory Visit (INDEPENDENT_AMBULATORY_CARE_PROVIDER_SITE_OTHER): Payer: Medicare Other | Admitting: Nurse Practitioner

## 2020-05-09 ENCOUNTER — Encounter: Payer: Self-pay | Admitting: Nurse Practitioner

## 2020-05-09 VITALS — BP 141/88 | HR 118 | Ht 62.0 in | Wt 175.2 lb

## 2020-05-09 DIAGNOSIS — IMO0002 Reserved for concepts with insufficient information to code with codable children: Secondary | ICD-10-CM

## 2020-05-09 DIAGNOSIS — I10 Essential (primary) hypertension: Secondary | ICD-10-CM | POA: Diagnosis not present

## 2020-05-09 DIAGNOSIS — E782 Mixed hyperlipidemia: Secondary | ICD-10-CM | POA: Diagnosis not present

## 2020-05-09 DIAGNOSIS — Z794 Long term (current) use of insulin: Secondary | ICD-10-CM

## 2020-05-09 DIAGNOSIS — E039 Hypothyroidism, unspecified: Secondary | ICD-10-CM

## 2020-05-09 DIAGNOSIS — E118 Type 2 diabetes mellitus with unspecified complications: Secondary | ICD-10-CM

## 2020-05-09 DIAGNOSIS — E1165 Type 2 diabetes mellitus with hyperglycemia: Secondary | ICD-10-CM

## 2020-05-09 LAB — POCT GLYCOSYLATED HEMOGLOBIN (HGB A1C): Hemoglobin A1C: 7.7 % — AB (ref 4.0–5.6)

## 2020-05-09 MED ORDER — TRESIBA FLEXTOUCH 200 UNIT/ML ~~LOC~~ SOPN: 70.0000 [IU] | PEN_INJECTOR | Freq: Every day | SUBCUTANEOUS | 3 refills | Status: DC

## 2020-05-09 NOTE — Progress Notes (Signed)
05/09/2020     Endocrinology follow-up note    Subjective:    Patient ID: [name], female    DOB: November 01, 1960.  She is being seen in follow-up for  management of diabetes,  hyperlipidemia, hypothyroidism.    PMD: Glenda Chroman, MD  Past Medical History:  Diagnosis Date  . Anxiety disorder   . Chronic low back pain   . Cirrhosis of liver ([place])   . Colitis   . Diabetes mellitus    Type II  . Diabetic neuropathy ([place])   . Diverticulitis   . Fatty liver   . GERD (gastroesophageal reflux disease)   . High cholesterol   . Hypertension   . Irritable bowel syndrome   . Meniere's disease   . Neuropathy   . Pain    Past Surgical History:  Procedure Laterality Date  . ABDOMINAL HYSTERECTOMY    . BIOPSY  03/20/2017   Procedure: BIOPSY;  Surgeon: [name], MD;  [facility];  Service: Endoscopy;;  colon gastric  . bladder tact  2005  . CHOLECYSTECTOMY  08/11  . COLONOSCOPY  2208  . COLONOSCOPY  In of September 2014   @ [facility]/Dr,.[name]  . COLONOSCOPY WITH PROPOFOL N/A 03/20/2017   Procedure: COLONOSCOPY WITH PROPOFOL;  Surgeon: [name], MD;  [facility];  Service: Endoscopy;  Laterality: N/A;  . ECTOPIC PREGNANCY SURGERY  1996  . ESOPHAGOGASTRODUODENOSCOPY (EGD) WITH PROPOFOL N/A 03/20/2017   Procedure: ESOPHAGOGASTRODUODENOSCOPY (EGD) WITH PROPOFOL;  Surgeon: [name], MD;  [facility];  Service: Endoscopy;  Laterality: N/A;  12:45  . HERNIA REPAIR  09/17/11  . LIGAMENT REPAIR Left 02/10/2018   Procedure: Left  ANKLE ATFL Repair and Ligament Augmentation, Injection of Tendon Splint Application;  Surgeon: [name], DPM;  Location: [place];  Service: Podiatry;  Laterality: Left;  . [place]  2009   Preformed on the right shoulder  . OVARIAN CYST REMOVAL     right side  . PARTIAL HYSTERECTOMY  2005  . POLYPECTOMY  03/20/2017   Procedure: POLYPECTOMY;  Surgeon: [name], MD;  [facility];   Service: Endoscopy;;  colon  . TENDON REPAIR Left 02/10/2018   Procedure: PERONEAL TENDON REPAIR and Synovectomy Peroneal Tendon;  Surgeon: [name], DPM;  Location: [place];  Service: Podiatry;  Laterality: Left;  . UPPER GASTROINTESTINAL ENDOSCOPY  2008  . URETERAL STENT PLACEMENT     Social History   Socioeconomic History  . Marital status: Divorced    Spouse name: Not on file  . Number of children: Not on file  . Years of education: Not on file  . Highest education level: Not on file  Occupational History  . Not on file  Social Needs  . Financial resource strain: Not on file  . Food insecurity:    Worry: Not on file    Inability: Not on file  . Transportation needs:    Medical: Not on file    Non-medical: Not on file  Tobacco Use  . Smoking status: Never Smoker  . Smokeless tobacco: Never Used  Substance and Sexual Activity  . Alcohol use: No  . Drug use: No  . Sexual activity: Yes    Birth control/protection: Surgical  Lifestyle  . Physical activity:    Days per week: Not on file    Minutes per session: Not on file  . Stress: Not on file  Relationships  . Social connections:  Talks on phone: Not on file    Gets together: Not on file    Attends religious service: Not on file    Active member of club or organization: Not on file    Attends meetings of clubs or organizations: Not on file    Relationship status: Not on file  Other Topics Concern  . Not on file  Social History Narrative  . Not on file   Current Outpatient Medications on File Prior to Visit  Medication Sig Dispense Refill  . acetaZOLAMIDE (DIAMOX) 250 MG tablet Take 250 mg by mouth daily.     Marland Kitchen acyclovir (ZOVIRAX) 400 MG tablet Take 400 mg by mouth 3 (three) times daily as needed (for fever blisters).     Marland Kitchen atorvastatin (LIPITOR) 20 MG tablet TAKE ONE TABLET BY MOUTH DAILY. 90 tablet 1  . baclofen (LIORESAL) 10 MG tablet     . chlorhexidine (PERIDEX) 0.12 % solution as needed.     .  Cholecalciferol (VITAMIN D3) 2000 units capsule Take 4,000 Units by mouth daily.     . Continuous Blood Gluc Sensor (FREESTYLE LIBRE 14 DAY SENSOR) MISC Inject 1 each into the skin every 14 (fourteen) days. Use as directed. 2 each 2  . cyanocobalamin (,VITAMIN B-12,) 1000 MCG/ML injection Inject 1,000 mcg into the muscle every 30 (thirty) days.  5  . diazepam (VALIUM) 2 MG tablet Take 2 mg by mouth every 12 (twelve) hours as needed for anxiety.     . dicyclomine (BENTYL) 20 MG tablet TAKE ONE TABLET BY MOUTH THREE TIMES DAILY 90 tablet 5  . DULoxetine (CYMBALTA) 60 MG capsule Take 60 mg by mouth daily.    Marland Kitchen estradiol (ESTRACE) 0.5 MG tablet Take 0.5 mg by mouth every evening.     . fluconazole (DIFLUCAN) 150 MG tablet Take 150 mg by mouth as needed.     . fluticasone (FLONASE) 50 MCG/ACT nasal spray Place 2 sprays into both nostrils every morning.     Marland Kitchen glucose blood (FREESTYLE LITE) test strip Use as instructed 100 each 2  . hydrochlorothiazide (HYDRODIURIL) 25 MG tablet Take 25 mg by mouth daily.     Marland Kitchen HYDROcodone-acetaminophen (NORCO/VICODIN) 5-325 MG tablet Take 1 tablet by mouth every 6 (six) hours as needed for severe pain. 12 tablet 0  . Influenza Vac Subunit Quad (FLUCELVAX QUADRIVALENT) 0.5 ML SUSY Flucelvax Quad 2019-2020 (PF) 60 mcg (15 mcg x 4)/0.5 mL IM syringe  INJECT 0.5ML INTRAMUSCULARLY ONCE.    Marland Kitchen insulin aspart (NOVOLOG FLEXPEN) 100 UNIT/ML FlexPen INJECT 15 UNITS INTO THE SKIN THREE TIMES DAILY. TAKE WITH MEALS. 15 mL 0  . levothyroxine (SYNTHROID) 100 MCG tablet TAKE ONE TABLET BY MOUTH DAILY BEFORE BREAKFAST 90 tablet 0  . lisinopril (ZESTRIL) 20 MG tablet     . metFORMIN (GLUCOPHAGE) 500 MG tablet TAKE ONE TABLET BY MOUTH TWICE DAILY. TAKE WITH A MEAL. 180 tablet 1  . metoprolol (LOPRESSOR) 50 MG tablet Take 25 mg by mouth daily.     . ondansetron (ZOFRAN) 4 MG tablet Take 1 tablet (4 mg total) by mouth every 8 (eight) hours as needed for nausea or vomiting. 12 tablet 0  .  pantoprazole (PROTONIX) 40 MG tablet 40 mg daily.   4  . Polyethyl Glycol-Propyl Glycol (SYSTANE OP) Place 1 drop into both eyes daily.    Marland Kitchen PRODIGY TWIST TOP LANCETS 28G MISC TEST TWICE DAILY 200 each 2  . TRESIBA FLEXTOUCH 200 UNIT/ML FlexTouch Pen INJECT 90 UNITS INTO THE  SKIN AT BEDTIME 9 mL 0  . Turmeric 500 MG CAPS Take 500 mg by mouth daily.      No current facility-administered medications on file prior to visit.     No facility-administered encounter medications on file as of 12/27/2018.    ALLERGIES: Allergies  Allergen Reactions  . Flagyl [Metronidazole Hcl] Itching and Rash  . Nsaids Nausea Only   VACCINATION STATUS: Immunization History  Administered Date(s) Administered  . Influenza Inj Mdck Quad Pf 08/07/2017, 08/11/2018   Diabetes She presents for her follow-up diabetic visit. She has type 2 diabetes mellitus. Onset time: She was diagnosed at the approximate age of 20. Her disease course has been fluctuating. There are no hypoglycemic associated symptoms. (Says she does not have any symptoms when her blood sugar drops dangerously low) Associated symptoms include polyuria. Pertinent negatives for diabetes include no chest pain, no fatigue, no polydipsia and no visual change. There are no hypoglycemic complications. Symptoms are improving. Diabetic complications include peripheral neuropathy. Risk factors for coronary artery disease include hypertension, diabetes mellitus, dyslipidemia and obesity. Current diabetic treatment includes insulin injections and oral agent (monotherapy). She is compliant with treatment most of the time. Her weight is fluctuating minimally. She is following a generally unhealthy diet. When asked about meal planning, she reported none. She has not had a previous visit with a dietitian. She rarely participates in exercise. Her home blood glucose trend is decreasing steadily. Her breakfast blood glucose range is generally 90-110 mg/dl. Her lunch blood  glucose range is generally 70-90 mg/dl. Her dinner blood glucose range is generally <70 mg/dl. Her bedtime blood glucose range is generally 90-110 mg/dl. Her overall blood glucose range is 90-110 mg/dl. (She presents today with her logs and CGM device showing tight fasting and postprandial glycemic profile.  She does have frequent episodes of hypoglycemia both fasting and postprandial noted.  Her CGM is reading time in target range of 67%.  Her point-of-care A1c today is 7.7%, slightly increasing from last visit at 7.1% on 11/09/2019.  She reports she does not have much of an appetite, and is struggling to eat 3 meals a day.) An ACE inhibitor/angiotensin II receptor blocker is being taken. She sees a podiatrist.Eye exam is current.  Thyroid Problem Presents for follow-up visit. Patient reports no cold intolerance, constipation, diarrhea, fatigue, heat intolerance, palpitations or visual change. The symptoms have been stable. Her past medical history is significant for diabetes and hyperlipidemia.  Hypertension This is a chronic problem. The current episode started more than 1 year ago. The problem has been gradually improving since onset. The problem is controlled. Pertinent negatives include no chest pain, palpitations or peripheral edema. There are no associated agents to hypertension. Risk factors for coronary artery disease include diabetes mellitus, dyslipidemia and sedentary lifestyle. Past treatments include ACE inhibitors and beta blockers. The current treatment provides significant improvement. There are no compliance problems.  Identifiable causes of hypertension include a thyroid problem.  Hyperlipidemia This is a chronic problem. The current episode started more than 1 year ago. The problem is controlled. Recent lipid tests were reviewed and are normal. Exacerbating diseases include diabetes and hypothyroidism. Pertinent negatives include no chest pain. Current antihyperlipidemic treatment includes  statins. The current treatment provides moderate improvement of lipids. There are no compliance problems.  Risk factors for coronary artery disease include diabetes mellitus, dyslipidemia and hypertension.     Review of systems:   Review of systems  Constitutional: + Minimally fluctuating body weight,  current  Body  mass index is 32.04 kg/m. , no fatigue, no subjective hyperthermia, no subjective hypothermia Eyes: no blurry vision, no xerophthalmia Cardiovascular: no Chest Pain, no Shortness of Breath, no palpitations, mild pitting pedal edema Respiratory: no cough, no shortness of breath Gastrointestinal: no Nausea/Vomiting/Diarhhea Musculoskeletal: no muscle/joint aches Skin: no rashes Neurological: no tremors, no numbness, no tingling, no dizziness Psychiatric: no depression, no anxiety   Objective:    BP (!) 141/88 (BP Location: Left Arm, Patient Position: Sitting)   Pulse (!) 118   Ht 5' 2"  (1.575 m)   Wt 175 lb 3.2 oz (79.5 kg)   BMI 32.04 kg/m     Physical Exam- Limited  Constitutional:  Body mass index is 32.04 kg/m. , not in acute distress, normal state of mind Eyes:  EOMI, no exophthalmos Neck: Supple Thyroid: No gross goiter Cardiovascular: RRR, no murmers, rubs, or gallops, +1 pitting edema to bilateral feet Respiratory: Adequate breathing efforts, no crackles, rales, rhonchi, or wheezing Musculoskeletal: no gross deformities, strength intact in all four extremities, no gross restriction of joint movements Skin:  no rashes, no hyperemia Neurological: no tremor with outstretched hands   No results found for this or any previous visit (from the past 2160 hour(s)).  Lipid Panel     Component Value Date/Time   CHOL 165 01/24/2020 0754   TRIG 107 01/24/2020 0754   HDL 49 (L) 01/24/2020 0754   CHOLHDL 3.4 01/24/2020 0754   VLDL 29 06/30/2016 1251   LDLCALC 96 01/24/2020 0754    Assessment & Plan:   1. Uncontrolled type 2 diabetes mellitus with  complication, with long-term current use of insulin  - Patient has currently uncontrolled symptomatic type 2 DM since  59 years of age.  She presents today with her logs and CGM device showing tight fasting and postprandial glycemic profile.  She does have frequent episodes of hypoglycemia both fasting and postprandial noted.  Her CGM is reading time in target range of 67%.  Her point-of-care A1c today is 7.7%, slightly increasing from last visit at 7.1% on 11/09/2019.  She reports she does not have much of an appetite, and is struggling to eat 3 meals a day.    Recent labs from 01/24/2020 reviewed.   Her diabetes is complicated by obesity, insulin resistance, and patient remains at a high risk for more acute and chronic complications of diabetes which include CAD, CVA, CKD, retinopathy, and neuropathy. These are all discussed in detail with the patient.  - The patient admits there is a room for improvement in their diet and drink choices. -  Suggestion is made for the patient to avoid simple carbohydrates from their diet including Cakes, Sweet Desserts / Pastries, Ice Cream, Soda (diet and regular), Sweet Tea, Candies, Chips, Cookies, Sweet Pastries,  Store Bought Juices, Alcohol in Excess of  1-2 drinks a day, Artificial Sweeteners, Coffee Creamer, and "Sugar-free" Products. This will help patient to have stable blood glucose profile and potentially avoid unintended weight gain.   - I encouraged the patient to switch to  unprocessed or minimally processed complex starch and increased protein intake (animal or plant source), fruits, and vegetables.   - Patient is advised to stick to a routine mealtimes to eat 3 meals  a day and avoid unnecessary snacks ( to snack only to correct hypoglycemia).  - I have approached patient with the following individualized plan to manage diabetes and patient agrees:    -She is approached to continue to document blood glucose profile and insulin  administrations  properly, using her CGM device to scan and test glucose at least 4 times a day, before meals and at bedtime.  She was instructed to call the clinic if blood glucose is less than 70 or greater than 200 for 3 tests in a row.  -Due to fasting and postprandial hypoglycemic episodes, will decrease Tresiba to 70 units SQ nightly.  We will continue with same dose of NovoLog 15 to 22 units if glucose above 90 and eating. Meal time insulin to be adjusted per patient specific sliding scale that was given to patient in writing today. -She is advised to continue Metformin 500 mg p.o. twice daily with meals.  -  She has history of intolerance for [place], likely would not tolerate other  incretin therapy.  - Patient specific target  A1c;  LDL, HDL, Triglycerides, and  Waist Circumference were discussed in detail.  2) Lipids/HPL:  Her recent lab showed controlled LDL of 96.  Is benefiting from and advised to continue Lipitor 20 mg p.o. daily at bedtime.  Side effects and precautions discussed with her.   3) hypothyroidism:  -Her thyroid function tests were reviewed from previous visit.  She is to continue levothyroxine 100 mcg p.o. daily before breakfast, until we recheck thyroid labs before next visit in 4 months.  Will make adjustments to her thyroid hormone if needed at that time.   - We discussed about the correct intake of her thyroid hormone, on empty stomach at fasting, with water, separated by at least 30 minutes from breakfast and other medications,  and separated by more than 4 hours from calcium, iron, multivitamins, acid reflux medications (PPIs). -Patient is made aware of the fact that thyroid hormone replacement is needed for life, dose to be adjusted by periodic monitoring of thyroid function tests.   - I advised patient to maintain close follow up with Glenda Chroman, MD for primary care needs.  - Time spent on this patient care encounter:  35 min, of which > 50% was spent in  counseling and the  rest reviewing her blood glucose logs , discussing her hypoglycemia and hyperglycemia episodes, reviewing her current and  previous labs / studies  ( including abstraction from other facilities) and medications  doses and developing a  long term treatment plan and documenting her care.   Please refer to Patient Instructions for Blood Glucose Monitoring and Insulin/Medications Dosing Guide"  in media tab for additional information. Please  also refer to " Patient Self Inventory" in the Media  tab for reviewed elements of pertinent patient history.  [name] participated in the discussions, expressed understanding, and voiced agreement with the above plans.  All questions were answered to her satisfaction. she is encouraged to contact clinic should she have any questions or concerns prior to her return visit.   Follow up plan: Return in about 4 months (around 09/08/2020) for Diabetes follow up with A1c in office, Thyroid follow up, Bring glucometer and logs, Previsit labs.  Rayetta Pigg, FNP-BC [facility] Phone: [phone] Fax: [phone]   12/27/2018, 1:59 PM

## 2020-05-09 NOTE — Patient Instructions (Signed)

## 2020-05-19 ENCOUNTER — Other Ambulatory Visit: Payer: Self-pay | Admitting: "Endocrinology

## 2020-05-19 DIAGNOSIS — E1165 Type 2 diabetes mellitus with hyperglycemia: Secondary | ICD-10-CM

## 2020-05-19 DIAGNOSIS — IMO0002 Reserved for concepts with insufficient information to code with codable children: Secondary | ICD-10-CM

## 2020-05-21 ENCOUNTER — Other Ambulatory Visit: Payer: Self-pay | Admitting: Nurse Practitioner

## 2020-05-21 ENCOUNTER — Telehealth: Payer: Self-pay | Admitting: "Endocrinology

## 2020-05-21 MED ORDER — TRESIBA FLEXTOUCH 200 UNIT/ML ~~LOC~~ SOPN: 70.0000 [IU] | PEN_INJECTOR | Freq: Every day | SUBCUTANEOUS | 3 refills | Status: DC

## 2020-05-21 NOTE — Telephone Encounter (Signed)
Pharmacy called and said that they never received RX for [place]. It says " fill later ". The pharmacy can not see it when it says fill later. Will you re send it please? Thanks.

## 2020-05-21 NOTE — Telephone Encounter (Signed)
Done, I resent it to be refilled now.  Thanks!

## 2020-05-22 ENCOUNTER — Other Ambulatory Visit: Payer: Self-pay | Admitting: Cardiology

## 2020-05-22 ENCOUNTER — Encounter: Payer: Self-pay | Admitting: Cardiology

## 2020-05-22 ENCOUNTER — Other Ambulatory Visit: Payer: Self-pay

## 2020-05-22 ENCOUNTER — Ambulatory Visit: Payer: Medicare Other | Admitting: Cardiology

## 2020-05-22 VITALS — BP 116/68 | HR 87 | Resp 15 | Ht 62.0 in | Wt 176.0 lb

## 2020-05-22 DIAGNOSIS — R0609 Other forms of dyspnea: Secondary | ICD-10-CM

## 2020-05-22 DIAGNOSIS — E7800 Pure hypercholesterolemia, unspecified: Secondary | ICD-10-CM | POA: Diagnosis not present

## 2020-05-22 DIAGNOSIS — Z0000 Encounter for general adult medical examination without abnormal findings: Secondary | ICD-10-CM | POA: Diagnosis not present

## 2020-05-22 DIAGNOSIS — Z8249 Family history of ischemic heart disease and other diseases of the circulatory system: Secondary | ICD-10-CM

## 2020-05-22 DIAGNOSIS — Z794 Long term (current) use of insulin: Secondary | ICD-10-CM | POA: Diagnosis not present

## 2020-05-22 DIAGNOSIS — Z7189 Other specified counseling: Secondary | ICD-10-CM | POA: Diagnosis not present

## 2020-05-22 DIAGNOSIS — R072 Precordial pain: Secondary | ICD-10-CM | POA: Diagnosis not present

## 2020-05-22 DIAGNOSIS — E6609 Other obesity due to excess calories: Secondary | ICD-10-CM

## 2020-05-22 DIAGNOSIS — E1165 Type 2 diabetes mellitus with hyperglycemia: Secondary | ICD-10-CM | POA: Diagnosis not present

## 2020-05-22 DIAGNOSIS — Z1339 Encounter for screening examination for other mental health and behavioral disorders: Secondary | ICD-10-CM | POA: Diagnosis not present

## 2020-05-22 DIAGNOSIS — Z1331 Encounter for screening for depression: Secondary | ICD-10-CM | POA: Diagnosis not present

## 2020-05-22 DIAGNOSIS — I10 Essential (primary) hypertension: Secondary | ICD-10-CM | POA: Diagnosis not present

## 2020-05-22 DIAGNOSIS — E785 Hyperlipidemia, unspecified: Secondary | ICD-10-CM

## 2020-05-22 DIAGNOSIS — R0600 Dyspnea, unspecified: Secondary | ICD-10-CM | POA: Diagnosis not present

## 2020-05-22 DIAGNOSIS — Z6832 Body mass index (BMI) 32.0-32.9, adult: Secondary | ICD-10-CM | POA: Diagnosis not present

## 2020-05-22 DIAGNOSIS — R5383 Other fatigue: Secondary | ICD-10-CM | POA: Diagnosis not present

## 2020-05-22 DIAGNOSIS — Z299 Encounter for prophylactic measures, unspecified: Secondary | ICD-10-CM | POA: Diagnosis not present

## 2020-05-22 DIAGNOSIS — Z79899 Other long term (current) drug therapy: Secondary | ICD-10-CM | POA: Diagnosis not present

## 2020-05-22 MED ORDER — METOPROLOL TARTRATE 50 MG PO TABS: 25.0000 mg | ORAL_TABLET | Freq: Two times a day (BID) | ORAL | 0 refills | Status: DC

## 2020-05-22 NOTE — Progress Notes (Signed)
Date:  05/22/2020   ID:  [name], DOB [birth year], MRN [id number]  PCP:  Glenda Chroman, MD  Cardiologist:  Rex Kras, DO, [facility] (established care 05/22/2020)  REASON FOR CONSULT: Dyspnea on exertion.   REQUESTING PHYSICIAN:  Glenda Chroman, MD [place],  [place] [address]  Chief Complaint  Patient presents with  . New Patient (Initial Visit)  . Shortness of Breath    HPI  [name] is a 59 y.o. female who presents to the office with a chief complaint of "shortness of breath." She is referred to the office at the request of Vyas, Dhruv B, MD. Patient's past medical history and cardiovascular risk factors include: Insulin-dependent diabetes mellitus type 2, hypertension with chronic kidney disease stage III, dyslipidemia, obesity due to excess calories, family history of premature coronary artery disease.  Patient is referred to the office at the request of her primary care provider for evaluation of dyspnea on exertion.  Patient states that her symptoms of dyspnea on exertion have been going on for the last 2 or 3 months.  The symptoms are usually brought on by going up a hill or walking a flight of stairs.  The symptoms are not progressive but the remaining stable for the last 2 or 3 months and is concerning.  The symptoms usually resolve in 20 to 30 minutes after resting.  Patient states that she has not noted any significant change in intensity, frequency, and/or duration of her symptoms.  Patient did have a echocardiogram done at her PCPs office which was obtained during this office visits.  Results are summarized below.  Review of systems are also positive for chest pain.  Patient states that she experiences right-sided chest pain, intensity 3 out of 10, going on for last 6 weeks at least, feels like a pressure-like sensation which is nonradiating, intermittent, last for about 10 minutes at a time, not brought on by effort related activities but does resolve with  rest.  Family history of premature CAD: Dad had MI at age of 21.   FUNCTIONAL STATUS: Walks her dogs 20-30 minutes twice a day.    ALLERGIES: Allergies  Allergen Reactions  . Flagyl [Metronidazole Hcl] Itching and Rash  . Nsaids Nausea Only    MEDICATION LIST PRIOR TO VISIT: Current Meds  Medication Sig  . acetaZOLAMIDE (DIAMOX) 250 MG tablet Take 250 mg by mouth daily.   Marland Kitchen acyclovir (ZOVIRAX) 400 MG tablet Take 400 mg by mouth 3 (three) times daily as needed (for fever blisters).   Marland Kitchen atorvastatin (LIPITOR) 20 MG tablet TAKE ONE TABLET BY MOUTH DAILY.  . baclofen (LIORESAL) 10 MG tablet   . Cholecalciferol (VITAMIN D3) 2000 units capsule Take 4,000 Units by mouth daily.   . Continuous Blood Gluc Sensor (FREESTYLE LIBRE 14 DAY SENSOR) MISC Inject 1 each into the skin every 14 (fourteen) days. Use as directed.  . cyanocobalamin (,VITAMIN B-12,) 1000 MCG/ML injection Inject 1,000 mcg into the muscle every 30 (thirty) days.  . diazepam (VALIUM) 2 MG tablet Take 5 mg by mouth every 12 (twelve) hours as needed for anxiety.   . dicyclomine (BENTYL) 20 MG tablet TAKE ONE TABLET BY MOUTH THREE TIMES DAILY  . DULoxetine (CYMBALTA) 60 MG capsule Take 60 mg by mouth daily.  Marland Kitchen estradiol (ESTRACE) 0.5 MG tablet Take 0.5 mg by mouth every evening.   . fluconazole (DIFLUCAN) 150 MG tablet Take 150 mg by mouth as needed.   . fluticasone (FLONASE)  50 MCG/ACT nasal spray Place 2 sprays into both nostrils every morning.   Marland Kitchen glucose blood (FREESTYLE LITE) test strip Use as instructed  . hydrochlorothiazide (HYDRODIURIL) 25 MG tablet Take 25 mg by mouth daily.   Marland Kitchen HYDROcodone-acetaminophen (NORCO/VICODIN) 5-325 MG tablet Take 1 tablet by mouth every 6 (six) hours as needed for severe pain.  . Influenza Vac Subunit Quad (FLUCELVAX QUADRIVALENT) 0.5 ML SUSY Flucelvax Quad 2019-2020 (PF) 60 mcg (15 mcg x 4)/0.5 mL IM syringe  INJECT 0.5ML INTRAMUSCULARLY ONCE.  Marland Kitchen insulin aspart (NOVOLOG FLEXPEN) 100  UNIT/ML FlexPen INJECT 15-22 UNITS INTO THE SKIN THREE TIMES DAILY. TAKE WITH MEALS.  Marland Kitchen insulin degludec (TRESIBA FLEXTOUCH) 200 UNIT/ML FlexTouch Pen Inject 70 Units into the skin at bedtime.  Marland Kitchen levothyroxine (SYNTHROID) 100 MCG tablet TAKE ONE TABLET BY MOUTH DAILY BEFORE BREAKFAST  . lisinopril (ZESTRIL) 20 MG tablet   . metFORMIN (GLUCOPHAGE) 500 MG tablet TAKE ONE TABLET BY MOUTH TWICE DAILY. TAKE WITH A MEAL.  Marland Kitchen ondansetron (ZOFRAN) 4 MG tablet Take 1 tablet (4 mg total) by mouth every 8 (eight) hours as needed for nausea or vomiting.  . pantoprazole (PROTONIX) 40 MG tablet 40 mg daily.   Marland Kitchen PRODIGY TWIST TOP LANCETS 28G MISC TEST TWICE DAILY  . Turmeric 500 MG CAPS Take 500 mg by mouth daily.      PAST MEDICAL HISTORY: Past Medical History:  Diagnosis Date  . Anxiety disorder   . Chronic low back pain   . Cirrhosis of liver ([place])   . Colitis   . Depression   . Diabetes mellitus    Type II  . Diabetic neuropathy ([place])   . Diverticulitis   . Fatty liver   . GERD (gastroesophageal reflux disease)   . High cholesterol   . Hypertension   . Irritable bowel syndrome   . Meniere's disease   . Neuropathy   . Pain     PAST SURGICAL HISTORY: Past Surgical History:  Procedure Laterality Date  . ABDOMINAL HYSTERECTOMY    . BIOPSY  03/20/2017   Procedure: BIOPSY;  Surgeon: [name], MD;  [facility];  Service: Endoscopy;;  colon gastric  . bladder tact  2005  . CHOLECYSTECTOMY  08/11  . COLONOSCOPY  2208  . COLONOSCOPY  In of September 2014   @ [facility]/Dr,.[name]  . COLONOSCOPY WITH PROPOFOL N/A 03/20/2017   Procedure: COLONOSCOPY WITH PROPOFOL;  Surgeon: [name], MD;  [facility];  Service: Endoscopy;  Laterality: N/A;  . ECTOPIC PREGNANCY SURGERY  1996  . ESOPHAGOGASTRODUODENOSCOPY (EGD) WITH PROPOFOL N/A 03/20/2017   Procedure: ESOPHAGOGASTRODUODENOSCOPY (EGD) WITH PROPOFOL;  Surgeon: [name], MD;  [facility];   Service: Endoscopy;  Laterality: N/A;  12:45  . HERNIA REPAIR  09/17/11  . LIGAMENT REPAIR Left 02/10/2018   Procedure: Left  ANKLE ATFL Repair and Ligament Augmentation, Injection of Tendon Splint Application;  Surgeon: [name], DPM;  Location: [facility];  Service: Podiatry;  Laterality: Left;  . [place]  2009   Preformed on the right shoulder  . OVARIAN CYST REMOVAL     right side  . PARTIAL HYSTERECTOMY  2005  . POLYPECTOMY  03/20/2017   Procedure: POLYPECTOMY;  Surgeon: [name], MD;  [facility];  Service: Endoscopy;;  colon  . TENDON REPAIR Left 02/10/2018   Procedure: PERONEAL TENDON REPAIR and Synovectomy Peroneal Tendon;  Surgeon: [name], DPM;  Location: [facility];  Service: Podiatry;  Laterality: Left;  . UPPER GASTROINTESTINAL ENDOSCOPY  2008  . URETERAL STENT PLACEMENT      FAMILY HISTORY: The patient family history includes Esophageal cancer in her brother; Healthy in her mother, son, and son; Heart disease in her father.  SOCIAL HISTORY:  The patient  reports that she has never smoked. She has never used smokeless tobacco. She reports that she does not drink alcohol and does not use drugs.  REVIEW OF SYSTEMS: Review of Systems  Constitutional: Negative for chills and fever.  HENT: Negative for hoarse voice and nosebleeds.   Eyes: Negative for discharge, double vision and pain.  Cardiovascular: Positive for chest pain and dyspnea on exertion. Negative for claudication, leg swelling, near-syncope, orthopnea, palpitations, paroxysmal nocturnal dyspnea and syncope.  Respiratory: Negative for hemoptysis and shortness of breath.   Musculoskeletal: Negative for muscle cramps and myalgias.  Gastrointestinal: Negative for abdominal pain, constipation, diarrhea, hematemesis, hematochezia, melena, nausea and vomiting.  Neurological: Negative for dizziness and light-headedness.    PHYSICAL EXAM: Vitals with BMI 05/22/2020 05/09/2020 04/19/2020  Height 5'  2" _0  -  Weight 176 lbs 175 lbs 3 oz -  BMI 73.42 87.68 -  Systolic 115 726 203  Diastolic 68 88 69  Pulse 87 118 68    CONSTITUTIONAL: Well-developed and well-nourished. No acute distress.  SKIN: Skin is warm and dry. No rash noted. No cyanosis. No pallor. No jaundice HEAD: Normocephalic and atraumatic.  EYES: No scleral icterus MOUTH/THROAT: Moist oral membranes.  NECK: No JVD present. No thyromegaly noted. No carotid bruits  LYMPHATIC: No visible cervical adenopathy.  CHEST Normal respiratory effort. No intercostal retractions  LUNGS: Clear to auscultation bilaterally.  No stridor. No wheezes. No rales.  CARDIOVASCULAR: Regular rate and rhythm, positive T5-H7, soft holosystolic murmur heard at the apex, no gallops or rubs ABDOMINAL: No apparent ascites.  EXTREMITIES: No peripheral edema  HEMATOLOGIC: No significant bruising NEUROLOGIC: Oriented to person, place, and time. Nonfocal. Normal muscle tone.  PSYCHIATRIC: Normal mood and affect. Normal behavior. Cooperative  CARDIAC DATABASE: EKG: 05/22/2020: Normal sinus rhythm, 85 bpm, poor R wave progression, without underlying ischemia or injury pattern.    Echocardiogram: 04/23/2020 performed at [facility] internal medicine: Per report patient's LVEF is 55 to 60%, Doppler evidence of left ventricular diastolic dysfunction, aortic valve sclerosis without stenosis, trace MR, mild TR, RVSP 19 mmHg.  Stress Testing: NA  Heart Catheterization: None  LABORATORY DATA: CBC Latest Ref Rng & Units 05/27/2019 02/10/2018 10/13/2017  WBC 4.0 - 10.5 K/uL 17.1(H) 13.9(H) 8.0  Hemoglobin 12.0 - 15.0 g/dL 14.5 13.4 12.8  Hematocrit 36 - 46 % 44.3 39.9 39.5  Platelets 150 - 400 K/uL 266 189 188    CMP Latest Ref Rng & Units 01/24/2020 07/28/2019 05/27/2019  Glucose 65 - 99 mg/dL 43(L) 79 83  BUN 7 - 25 mg/dL 23 23 23(H)  Creatinine 0.50 - 1.05 mg/dL 0.89 0.95 0.84  Sodium 135 - 146 mmol/L 141 138 140  Potassium 3.5 - 5.3 mmol/L 3.8 3.7 3.3(L)   Chloride 98 - 110 mmol/L 105 104 103  CO2 20 - 32 mmol/L _1 Calcium 8.6 - 10.4 mg/dL 9.4 9.6 10.0  Total Protein 6.1 - 8.1 g/dL 7.3 6.7 8.4(H)  Total Bilirubin 0.2 - 1.2 mg/dL 0.5 0.4 0.9  Alkaline Phos 38 - 126 U/L - - 133(H)  AST 10 - 35 U/L 26 38(H) 53(H)  ALT 6 - 29 U/L 25 37(H) 48(H)    Lipid Panel  Component Value Date/Time   CHOL 165 01/24/2020 0754   TRIG 107 01/24/2020 0754   HDL 49 (L) 01/24/2020 0754   CHOLHDL 3.4 01/24/2020 0754   VLDL 29 06/30/2016 1251   LDLCALC 96 01/24/2020 0754    No components found for: NTPROBNP No results for input(s): PROBNP in the last 8760 hours. Recent Labs    07/28/19 0822 01/24/20 0754  TSH 8.57* 0.53    BMP Recent Labs    05/27/19 2054 07/28/19 0822 01/24/20 0754  NA 140 138 141  K 3.3* 3.7 3.8  CL 103 104 105  CO2 _0 GLUCOSE 83 79 43*  BUN 23* 23 23  CREATININE 0.84 0.95 0.89  CALCIUM 10.0 9.6 9.4  GFRNONAA >60 66 71  GFRAA >60 77 83    HEMOGLOBIN A1C Lab Results  Component Value Date   HGBA1C 7.7 (A) 05/09/2020   MPG 206 07/28/2019   External Labs: Collected: 03/08/2020 Creatinine 1.28 mg/dL. eGFR: 46 mL/min per 1.73 m  IMPRESSION:    ICD-10-CM   1. Dyspnea on exertion  R06.00 EKG 12-Lead    CT CORONARY MORPH W/CTA COR W/SCORE W/CA W/CM &/OR WO/CM    CT CORONARY FRACTIONAL FLOW RESERVE DATA PREP    CT CORONARY FRACTIONAL FLOW RESERVE FLUID ANALYSIS    Basic metabolic panel    metoprolol tartrate (LOPRESSOR) 50 MG tablet  2. Family history of premature CAD  Z82.49   3. Precordial chest pain  R07.2 CT CORONARY MORPH W/CTA COR W/SCORE W/CA W/CM &/OR WO/CM    CT CORONARY FRACTIONAL FLOW RESERVE DATA PREP    CT CORONARY FRACTIONAL FLOW RESERVE FLUID ANALYSIS    Basic metabolic panel  4. Type 2 diabetes mellitus with hyperglycemia, with long-term current use of insulin (HCC)  E11.65    Z79.4   5. Long-term insulin use (HCC)  Z79.4   6. Benign hypertension  I10   7. Dyslipidemia   E78.5   8. Class 1 obesity due to excess calories with serious comorbidity and body mass index (BMI) of 32.0 to 32.9 in adult  E66.09    Z68.32      RECOMMENDATIONS: CHARDAY CAPETILLO is a 59 y.o. female whose past medical history and cardiac risk factors include: Insulin-dependent diabetes mellitus type 2, hypertension with chronic kidney disease stage III, dyslipidemia, obesity due to excess calories, family history of premature coronary artery disease.  Dyspnea on exertion:  Patient symptoms of shortness of breath with effort related activities have been going on for last 2 or 3 months.  She has undergone an echocardiogram at her PCPs office results reviewed and noted above.  LVEF reported to be within normal limits.  No significant valvular heart disease.  And mention of possible diastolic dysfunction.   EKG shows normal sinus rhythm with poor R wave progression without underlying ischemia or injury pattern.  Given her history of insulin-dependent diabetes mellitus type 2 and multiple cardiovascular risk factors including premature coronary artery disease recommended ischemic evaluation.  Discussed risks and benefits of coronary CTA as well as stress test.  Patient would like to proceed with coronary CTA as she is already had multiple stress test in the past.  Patient will be scheduled for coronary CTA with possible CT FFR.  Increase Lopressor to 25 mg p.o. twice daily.  On the day of the coronary CTA patient is recommended to take Lopressor 50 mg p.o. 2 hours prior to the study.   Check BMP to reevaluate kidney function.  Family history of premature coronary disease: Continue statin therapy.  See discussion above.  Precordial chest pain: See above.  Benign essential hypertension with chronic kidney disease stage III . Office blood pressure is at goal.  . Medication reconciled.  . Currently managed by primary care provider. . Low salt diet recommended. A diet that is rich in fruits,  vegetables, legumes, and low-fat dairy products and low in snacks, sweets, and meats (such as the Dietary Approaches to Stop Hypertension [DASH] diet).   Dyslipidemia: Continue statin therapy.  Currently managed by primary care provider.  Independently reviewed office notes provided by her PCP, as well as labs from June 2021, and echo report summarized findings noted above.  FINAL MEDICATION LIST END OF ENCOUNTER: Meds ordered this encounter  Medications  . metoprolol tartrate (LOPRESSOR) 50 MG tablet    Sig: Take 0.5 tablets (25 mg total) by mouth 2 (two) times daily.    Dispense:  90 tablet    Refill:  0    Medications Discontinued During This Encounter  Medication Reason  . chlorhexidine (PERIDEX) 0.12 % solution Patient Preference  . metoprolol (LOPRESSOR) 50 MG tablet      Current Outpatient Medications:  .  acetaZOLAMIDE (DIAMOX) 250 MG tablet, Take 250 mg by mouth daily. , Disp: , Rfl:  .  acyclovir (ZOVIRAX) 400 MG tablet, Take 400 mg by mouth 3 (three) times daily as needed (for fever blisters). , Disp: , Rfl:  .  atorvastatin (LIPITOR) 20 MG tablet, TAKE ONE TABLET BY MOUTH DAILY., Disp: 90 tablet, Rfl: 1 .  baclofen (LIORESAL) 10 MG tablet, , Disp: , Rfl:  .  Cholecalciferol (VITAMIN D3) 2000 units capsule, Take 4,000 Units by mouth daily. , Disp: , Rfl:  .  Continuous Blood Gluc Sensor (FREESTYLE LIBRE 14 DAY SENSOR) MISC, Inject 1 each into the skin every 14 (fourteen) days. Use as directed., Disp: 2 each, Rfl: 2 .  cyanocobalamin (,VITAMIN B-12,) 1000 MCG/ML injection, Inject 1,000 mcg into the muscle every 30 (thirty) days., Disp: , Rfl: 5 .  diazepam (VALIUM) 2 MG tablet, Take 5 mg by mouth every 12 (twelve) hours as needed for anxiety. , Disp: , Rfl:  .  dicyclomine (BENTYL) 20 MG tablet, TAKE ONE TABLET BY MOUTH THREE TIMES DAILY, Disp: 90 tablet, Rfl: 5 .  DULoxetine (CYMBALTA) 60 MG capsule, Take 60 mg by mouth daily., Disp: , Rfl:  .  estradiol (ESTRACE) 0.5 MG  tablet, Take 0.5 mg by mouth every evening. , Disp: , Rfl:  .  fluconazole (DIFLUCAN) 150 MG tablet, Take 150 mg by mouth as needed. , Disp: , Rfl:  .  fluticasone (FLONASE) 50 MCG/ACT nasal spray, Place 2 sprays into both nostrils every morning. , Disp: , Rfl:  .  glucose blood (FREESTYLE LITE) test strip, Use as instructed, Disp: 100 each, Rfl: 2 .  hydrochlorothiazide (HYDRODIURIL) 25 MG tablet, Take 25 mg by mouth daily. , Disp: , Rfl:  .  HYDROcodone-acetaminophen (NORCO/VICODIN) 5-325 MG tablet, Take 1 tablet by mouth every 6 (six) hours as needed for severe pain., Disp: 12 tablet, Rfl: 0 .  Influenza Vac Subunit Quad (FLUCELVAX QUADRIVALENT) 0.5 ML SUSY, Flucelvax Quad 2019-2020 (PF) 60 mcg (15 mcg x 4)/0.5 mL IM syringe  INJECT 0.5ML INTRAMUSCULARLY ONCE., Disp: , Rfl:  .  insulin aspart (NOVOLOG FLEXPEN) 100 UNIT/ML FlexPen, INJECT 15-22 UNITS INTO THE SKIN THREE TIMES DAILY. TAKE WITH MEALS., Disp: 15 mL, Rfl: 3 .  insulin degludec (TRESIBA FLEXTOUCH) 200 UNIT/ML  FlexTouch Pen, Inject 70 Units into the skin at bedtime., Disp: 15 mL, Rfl: 3 .  levothyroxine (SYNTHROID) 100 MCG tablet, TAKE ONE TABLET BY MOUTH DAILY BEFORE BREAKFAST, Disp: 90 tablet, Rfl: 0 .  lisinopril (ZESTRIL) 20 MG tablet, , Disp: , Rfl:  .  metFORMIN (GLUCOPHAGE) 500 MG tablet, TAKE ONE TABLET BY MOUTH TWICE DAILY. TAKE WITH A MEAL., Disp: 180 tablet, Rfl: 1 .  ondansetron (ZOFRAN) 4 MG tablet, Take 1 tablet (4 mg total) by mouth every 8 (eight) hours as needed for nausea or vomiting., Disp: 12 tablet, Rfl: 0 .  pantoprazole (PROTONIX) 40 MG tablet, 40 mg daily. , Disp: , Rfl: 4 .  PRODIGY TWIST TOP LANCETS 28G MISC, TEST TWICE DAILY, Disp: 200 each, Rfl: 2 .  Turmeric 500 MG CAPS, Take 500 mg by mouth daily. , Disp: , Rfl:  .  metoprolol tartrate (LOPRESSOR) 50 MG tablet, Take 0.5 tablets (25 mg total) by mouth 2 (two) times daily., Disp: 90 tablet, Rfl: 0 .  Polyethyl Glycol-Propyl Glycol (SYSTANE OP), Place 1 drop  into both eyes daily. (Patient not taking: Reported on 05/22/2020), Disp: , Rfl:   Orders Placed This Encounter  Procedures  . CT CORONARY MORPH W/CTA COR W/SCORE W/CA W/CM &/OR WO/CM  . CT CORONARY FRACTIONAL FLOW RESERVE DATA PREP  . CT CORONARY FRACTIONAL FLOW RESERVE FLUID ANALYSIS  . Basic metabolic panel  . EKG 12-Lead    There are no Patient Instructions on file for this visit.   --Continue cardiac medications as reconciled in final medication list. --Return in about 4 weeks (around 06/19/2020) for re-evaluation of shortness of breath., review CCTA.. Or sooner if needed. --Continue follow-up with your primary care physician regarding the management of your other chronic comorbid conditions.  Patient's questions and concerns were addressed to her satisfaction. She voices understanding of the instructions provided during this encounter.   This note was created using a voice recognition software as a result there may be grammatical errors inadvertently enclosed that do not reflect the nature of this encounter. Every attempt is made to correct such errors.  Rex Kras, [place], [facility]  Pager: [phone] Office: [phone]

## 2020-05-23 LAB — BASIC METABOLIC PANEL
BUN/Creatinine Ratio: 22 (ref 9–23)
BUN: 23 mg/dL (ref 6–24)
CO2: 25 mmol/L (ref 20–29)
Calcium: 9.7 mg/dL (ref 8.7–10.2)
Chloride: 105 mmol/L (ref 96–106)
Creatinine, Ser: 1.05 mg/dL — ABNORMAL HIGH (ref 0.57–1.00)
GFR calc Af Amer: 68 mL/min/{1.73_m2} (ref >59)
GFR calc non Af Amer: 59 mL/min/{1.73_m2} — ABNORMAL LOW (ref >59)
Glucose: 94 mg/dL (ref 65–99)
Potassium: 4 mmol/L (ref 3.5–5.2)
Sodium: 142 mmol/L (ref 134–144)

## 2020-05-25 ENCOUNTER — Telehealth: Payer: Self-pay

## 2020-05-25 NOTE — Telephone Encounter (Signed)
-----   Message from [place], [place] sent at 05/25/2020 12:14 PM EDT ----- Please have her increase are water intake by 2-3 glasses per day. Kidney function is slightly elevated compared to baseline.

## 2020-05-25 NOTE — Telephone Encounter (Signed)
Left vm to cb.

## 2020-05-30 ENCOUNTER — Telehealth (HOSPITAL_COMMUNITY): Payer: Self-pay | Admitting: Emergency Medicine

## 2020-05-30 ENCOUNTER — Other Ambulatory Visit: Payer: Self-pay | Admitting: Nurse Practitioner

## 2020-05-30 DIAGNOSIS — G8929 Other chronic pain: Secondary | ICD-10-CM

## 2020-05-30 DIAGNOSIS — M545 Low back pain, unspecified: Secondary | ICD-10-CM

## 2020-05-30 NOTE — Telephone Encounter (Signed)
Left vm to cb.

## 2020-05-30 NOTE — Telephone Encounter (Signed)
Attempted to call patient regarding upcoming cardiac CT appointment. °Left message on voicemail with name and callback number °Francyne Arreaga RN Navigator Cardiac Imaging °[facility] °[phone] Office °[phone] Cell ° °

## 2020-05-31 ENCOUNTER — Telehealth (HOSPITAL_COMMUNITY): Payer: Self-pay | Admitting: Emergency Medicine

## 2020-05-31 ENCOUNTER — Ambulatory Visit (HOSPITAL_COMMUNITY): Admission: RE | Admit: 2020-05-31 | Payer: Medicare Other | Source: Ambulatory Visit

## 2020-05-31 NOTE — Telephone Encounter (Signed)
Pt had left me a VM after hours. Attempted to return her call but no answer. Left another message for call back Marchia Bond RN Navigator Cardiac Imaging [facility] [phone] Office  [phone] Cell

## 2020-06-01 ENCOUNTER — Telehealth: Payer: Self-pay

## 2020-06-02 NOTE — Telephone Encounter (Signed)
Please try gain.

## 2020-06-05 ENCOUNTER — Encounter (INDEPENDENT_AMBULATORY_CARE_PROVIDER_SITE_OTHER): Payer: Self-pay | Admitting: Internal Medicine

## 2020-06-05 ENCOUNTER — Encounter (INDEPENDENT_AMBULATORY_CARE_PROVIDER_SITE_OTHER): Payer: Self-pay | Admitting: *Deleted

## 2020-06-05 ENCOUNTER — Ambulatory Visit (INDEPENDENT_AMBULATORY_CARE_PROVIDER_SITE_OTHER): Payer: Medicare Other | Admitting: Internal Medicine

## 2020-06-05 ENCOUNTER — Other Ambulatory Visit: Payer: Self-pay

## 2020-06-05 VITALS — BP 115/78 | HR 75 | Temp 98.9°F | Ht 62.0 in | Wt 173.7 lb

## 2020-06-05 DIAGNOSIS — K7460 Unspecified cirrhosis of liver: Secondary | ICD-10-CM | POA: Diagnosis not present

## 2020-06-05 DIAGNOSIS — K580 Irritable bowel syndrome with diarrhea: Secondary | ICD-10-CM | POA: Diagnosis not present

## 2020-06-05 DIAGNOSIS — K219 Gastro-esophageal reflux disease without esophagitis: Secondary | ICD-10-CM

## 2020-06-05 DIAGNOSIS — K7581 Nonalcoholic steatohepatitis (NASH): Secondary | ICD-10-CM

## 2020-06-05 DIAGNOSIS — K469 Unspecified abdominal hernia without obstruction or gangrene: Secondary | ICD-10-CM | POA: Insufficient documentation

## 2020-06-05 DIAGNOSIS — K439 Ventral hernia without obstruction or gangrene: Secondary | ICD-10-CM | POA: Insufficient documentation

## 2020-06-05 MED ORDER — RIFAXIMIN 550 MG PO TABS: 550.0000 mg | ORAL_TABLET | Freq: Three times a day (TID) | ORAL | 0 refills | Status: DC

## 2020-06-05 NOTE — Progress Notes (Signed)
Presenting complaint;  Follow-up for cirrhosis, GERD and IBS.  Database and subjective:  Patient is 59 year old Caucasian female who has cirrhosis secondary to [facility] GERD and IBS who is here for scheduled visit.  She was last seen 1 year ago. She says her stress heartburn is concerned she is doing well with therapy.  She has noted bloating and nausea over the last 2 months.  She has been using Zofran every day for several days.  She has noted her stools to be very foul-smelling.  She also complains of lower abdominal pain.  She saw her PCP on April 25, 2020 and her white cell count was elevated at 17,000.  She was given Augmentin for 10 days and felt some better.  She has cramping and diarrhea after every meal.  She is having accidents almost every day.  Sometimes she is not even aware that she has passed some stool.  She denies melena or rectal bleeding.  She has noted abdominal discomfort for the milk and cheese.  She also has noted lump in her upper abdomen is concerned that she has developed abdominal wall hernia. She says she is scheduled to see a cardiologist because she is experiencing dyspnea on exertion on taking 1 flight of stairs.   Current Medications: Outpatient Encounter Medications as of 06/05/2020  Medication Sig  . acetaZOLAMIDE (DIAMOX) 250 MG tablet Take 250 mg by mouth daily.   Marland Kitchen acyclovir (ZOVIRAX) 400 MG tablet Take 400 mg by mouth 3 (three) times daily as needed (for fever blisters).   Marland Kitchen atorvastatin (LIPITOR) 20 MG tablet TAKE ONE TABLET BY MOUTH DAILY.  . baclofen (LIORESAL) 10 MG tablet   . Cholecalciferol (VITAMIN D3) 2000 units capsule Take 4,000 Units by mouth daily.   . Continuous Blood Gluc Sensor (FREESTYLE LIBRE 14 DAY SENSOR) MISC Inject 1 each into the skin every 14 (fourteen) days. Use as directed.  . cyanocobalamin (,VITAMIN B-12,) 1000 MCG/ML injection Inject 1,000 mcg into the muscle every 30 (thirty) days.  . diazepam (VALIUM) 2 MG tablet Take 5 mg by mouth  every 12 (twelve) hours as needed for anxiety.   . dicyclomine (BENTYL) 20 MG tablet TAKE ONE TABLET BY MOUTH THREE TIMES DAILY  . DULoxetine (CYMBALTA) 60 MG capsule Take 60 mg by mouth daily.  Marland Kitchen estradiol (ESTRACE) 0.5 MG tablet Take 0.5 mg by mouth every evening.   . fluconazole (DIFLUCAN) 150 MG tablet Take 150 mg by mouth as needed.   . fluticasone (FLONASE) 50 MCG/ACT nasal spray Place 2 sprays into both nostrils every morning.   . hydrochlorothiazide (HYDRODIURIL) 25 MG tablet Take 25 mg by mouth daily.   Marland Kitchen HYDROcodone-acetaminophen (NORCO/VICODIN) 5-325 MG tablet Take 1 tablet by mouth every 6 (six) hours as needed for severe pain.  Marland Kitchen insulin aspart (NOVOLOG FLEXPEN) 100 UNIT/ML FlexPen INJECT 15-22 UNITS INTO THE SKIN THREE TIMES DAILY. TAKE WITH MEALS.  Marland Kitchen insulin degludec (TRESIBA FLEXTOUCH) 200 UNIT/ML FlexTouch Pen Inject 70 Units into the skin at bedtime.  Marland Kitchen levothyroxine (SYNTHROID) 100 MCG tablet TAKE ONE TABLET BY MOUTH DAILY BEFORE BREAKFAST  . lisinopril (ZESTRIL) 20 MG tablet   . metFORMIN (GLUCOPHAGE) 500 MG tablet TAKE ONE TABLET BY MOUTH TWICE DAILY. TAKE WITH A MEAL.  . metoprolol tartrate (LOPRESSOR) 50 MG tablet Take 0.5 tablets (25 mg total) by mouth 2 (two) times daily.  . ondansetron (ZOFRAN) 4 MG tablet Take 1 tablet (4 mg total) by mouth every 8 (eight) hours as needed for nausea or vomiting.  Marland Kitchen  pantoprazole (PROTONIX) 40 MG tablet 40 mg daily.   Marland Kitchen PRODIGY TWIST TOP LANCETS 28G MISC TEST TWICE DAILY  . Turmeric 500 MG CAPS Take 500 mg by mouth daily.   Marland Kitchen glucose blood (FREESTYLE LITE) test strip Use as instructed (Patient not taking: Reported on 06/05/2020)  . [DISCONTINUED] Influenza Vac Subunit Quad (FLUCELVAX QUADRIVALENT) 0.5 ML SUSY Flucelvax Quad 2019-2020 (PF) 60 mcg (15 mcg x 4)/0.5 mL IM syringe  INJECT 0.5ML INTRAMUSCULARLY ONCE. (Patient not taking: Reported on 06/05/2020)   No facility-administered encounter medications on file as of 06/05/2020.      Objective: Blood pressure 115/78, pulse 75, temperature 98.9 F (37.2 C), temperature source Oral, height 5' 2"  (1.575 m), weight 173 lb 11.2 oz (78.8 kg). Patient is alert and in no acute distress. She is wearing a mask. Conjunctiva is pink. Sclera is nonicteric Oropharyngeal mucosa is dry otherwise normal. No neck masses or thyromegaly noted. Cardiac exam with regular rhythm normal S1 and S2. No murmur or gallop noted. Lungs are clear to auscultation. Abdomen is full.  She has umbilical hernia in the midline above her umbilicus.  It is reducible.  It is not tender.  Size is smaller than that of golf ball.  She has mild tenderness in left upper and left lower quadrant without guarding or rebound.  No organomegaly or masses. No LE edema or clubbing noted.  Labs/studies Results:   CBC Latest Ref Rng & Units 05/27/2019 02/10/2018 10/13/2017  WBC 4.0 - 10.5 K/uL 17.1(H) 13.9(H) 8.0  Hemoglobin 12.0 - 15.0 g/dL 14.5 13.4 12.8  Hematocrit 36 - 46 % 44.3 39.9 39.5  Platelets 150 - 400 K/uL 266 189 188    CMP Latest Ref Rng & Units 05/22/2020 01/24/2020 07/28/2019  Glucose 65 - 99 mg/dL 94 43(L) 79  BUN 6 - 24 mg/dL 23 23 23   Creatinine 0.57 - 1.00 mg/dL 1.05(H) 0.89 0.95  Sodium 134 - 144 mmol/L 142 141 138  Potassium 3.5 - 5.2 mmol/L 4.0 3.8 3.7  Chloride 96 - 106 mmol/L 105 105 104  CO2 20 - 29 mmol/L 25 27 25   Calcium 8.7 - 10.2 mg/dL 9.7 9.4 9.6  Total Protein 6.1 - 8.1 g/dL - 7.3 6.7  Total Bilirubin 0.2 - 1.2 mg/dL - 0.5 0.4  Alkaline Phos 38 - 126 U/L - - -  AST 10 - 35 U/L - 26 38(H)  ALT 6 - 29 U/L - 25 37(H)    Hepatic Function Latest Ref Rng & Units 01/24/2020 07/28/2019 05/27/2019  Total Protein 6.1 - 8.1 g/dL 7.3 6.7 8.4(H)  Albumin 3.5 - 5.0 g/dL - - 4.0  AST 10 - 35 U/L 26 38(H) 53(H)  ALT 6 - 29 U/L 25 37(H) 48(H)  Alk Phosphatase 38 - 126 U/L - - 133(H)  Total Bilirubin 0.2 - 1.2 mg/dL 0.5 0.4 0.9  Bilirubin, Direct <=0.2 mg/dL - - -     Assessment:  #1.   Cirrhosis secondary to NASH.  He had all the biochemical markers back in December 2017.  He remains with well-preserved hepatic function.  Transaminases back in April 2021 were normal.   #2.  Irritable bowel syndrome with diarrhea.  Her symptoms have gotten worse.  He is on fairly high dose of dicyclomine which she is tolerating well.  She is also having bloating and nausea.  Suspect small intestinal bacterial overgrowth.  #3.  GERD.  She is doing well with dietary measures and pantoprazole 40 mg daily.  #4.  Ventral  hernia.  Hernia is located above umbilicus.  It is reducible.  It is less than the size of a golf ball.  It would be a good idea for her to get it fixed before it gets large when risk of recurrence is significant.   Plan:  Patient will go to the lab for CBC, LFTs and alpha-fetoprotein. Xifaxan 550 mg by mouth 3 times a day for 14 days. Abdominal ultrasound to be scheduled. Patient will call with progress report after she finishes Xifaxan. Will arrange for surgical consultation when she is ready. Office visit in 6 months.

## 2020-06-05 NOTE — Patient Instructions (Signed)
Physician will call with results of blood test and ultrasound when completed. Please call with progress report end of the month to let us know if Xifaxan helps or not.

## 2020-06-06 LAB — CBC WITH DIFFERENTIAL/PLATELET
Absolute Monocytes: 713 cells/uL (ref 200–950)
Basophils Absolute: 54 cells/uL (ref 0–200)
Basophils Relative: 0.5 %
Eosinophils Absolute: 227 cells/uL (ref 15–500)
Eosinophils Relative: 2.1 %
HCT: 39 % (ref 35.0–45.0)
Hemoglobin: 13.2 g/dL (ref 11.7–15.5)
Lymphs Abs: 4136 cells/uL — ABNORMAL HIGH (ref 850–3900)
MCH: 30.6 pg (ref 27.0–33.0)
MCHC: 33.8 g/dL (ref 32.0–36.0)
MCV: 90.5 fL (ref 80.0–100.0)
MPV: 11.6 fL (ref 7.5–12.5)
Monocytes Relative: 6.6 %
Neutro Abs: 5670 cells/uL (ref 1500–7800)
Neutrophils Relative %: 52.5 %
Platelets: 205 10*3/uL (ref 140–400)
RBC: 4.31 10*6/uL (ref 3.80–5.10)
RDW: 13 % (ref 11.0–15.0)
Total Lymphocyte: 38.3 %
WBC: 10.8 10*3/uL (ref 3.8–10.8)

## 2020-06-06 LAB — HEPATIC FUNCTION PANEL
AG Ratio: 1.2 (calc) (ref 1.0–2.5)
ALT: 23 U/L (ref 6–29)
AST: 36 U/L — ABNORMAL HIGH (ref 10–35)
Albumin: 4.2 g/dL (ref 3.6–5.1)
Alkaline phosphatase (APISO): 98 U/L (ref 37–153)
Bilirubin, Direct: 0.1 mg/dL (ref 0.0–0.2)
Globulin: 3.4 g/dL (calc) (ref 1.9–3.7)
Indirect Bilirubin: 0.5 mg/dL (calc) (ref 0.2–1.2)
Total Bilirubin: 0.6 mg/dL (ref 0.2–1.2)
Total Protein: 7.6 g/dL (ref 6.1–8.1)

## 2020-06-06 LAB — AFP TUMOR MARKER: AFP-Tumor Marker: 4.1 ng/mL

## 2020-06-06 NOTE — Telephone Encounter (Signed)
Looks like patient called back yesterday-   She has been sch for her CCTA for 06/14/20   Thank you

## 2020-06-06 NOTE — Telephone Encounter (Signed)
Okay.  Thanks.

## 2020-06-07 ENCOUNTER — Other Ambulatory Visit: Payer: Self-pay | Admitting: "Endocrinology

## 2020-06-13 ENCOUNTER — Telehealth (HOSPITAL_COMMUNITY): Payer: Self-pay | Admitting: Emergency Medicine

## 2020-06-13 NOTE — Telephone Encounter (Signed)
Reaching out to patient to offer assistance regarding upcoming cardiac imaging study; pt verbalizes understanding of appt date/time, parking situation and where to check in, pre-test NPO status and medications ordered, and verified current allergies; name and call back number provided for further questions should they arise Vici Novick RN Navigator Cardiac Imaging [facility] [phone] office [phone] cell 

## 2020-06-14 ENCOUNTER — Ambulatory Visit (HOSPITAL_COMMUNITY)
Admission: RE | Admit: 2020-06-14 | Discharge: 2020-06-14 | Disposition: A | Discharge status: Home / Self Care | Payer: Medicare Other | Source: Ambulatory Visit | Attending: Cardiology | Admitting: Cardiology

## 2020-06-14 ENCOUNTER — Other Ambulatory Visit: Payer: Self-pay

## 2020-06-14 DIAGNOSIS — Z8249 Family history of ischemic heart disease and other diseases of the circulatory system: Secondary | ICD-10-CM | POA: Diagnosis not present

## 2020-06-14 DIAGNOSIS — R0600 Dyspnea, unspecified: Secondary | ICD-10-CM | POA: Insufficient documentation

## 2020-06-14 DIAGNOSIS — R072 Precordial pain: Secondary | ICD-10-CM | POA: Insufficient documentation

## 2020-06-14 DIAGNOSIS — R0609 Other forms of dyspnea: Secondary | ICD-10-CM

## 2020-06-14 MED ORDER — IOHEXOL 350 MG/ML SOLN
80.0000 mL | Freq: Once | INTRAVENOUS | Status: AC | PRN
  Administered 2020-06-14: 80 mL via INTRAVENOUS

## 2020-06-14 MED ORDER — NITROGLYCERIN 0.4 MG SL SUBL
SUBLINGUAL_TABLET | SUBLINGUAL | Status: AC
  Filled 2020-06-14: qty 2

## 2020-06-14 MED ORDER — NITROGLYCERIN 0.4 MG SL SUBL
0.8000 mg | SUBLINGUAL_TABLET | Freq: Once | SUBLINGUAL | Status: AC
  Administered 2020-06-14: 0.8 mg via SUBLINGUAL

## 2020-06-15 ENCOUNTER — Ambulatory Visit: Payer: Medicare Other | Admitting: Cardiology

## 2020-06-16 ENCOUNTER — Ambulatory Visit (HOSPITAL_COMMUNITY)
Admission: RE | Admit: 2020-06-16 | Discharge: 2020-06-16 | Disposition: A | Discharge status: Home / Self Care | Payer: Medicare Other | Source: Ambulatory Visit | Attending: Cardiology | Admitting: Cardiology

## 2020-06-16 DIAGNOSIS — R072 Precordial pain: Secondary | ICD-10-CM | POA: Diagnosis not present

## 2020-06-16 DIAGNOSIS — R0600 Dyspnea, unspecified: Secondary | ICD-10-CM | POA: Diagnosis not present

## 2020-06-16 DIAGNOSIS — R0609 Other forms of dyspnea: Secondary | ICD-10-CM

## 2020-06-19 ENCOUNTER — Ambulatory Visit: Payer: Medicare Other | Admitting: Cardiology

## 2020-06-20 ENCOUNTER — Ambulatory Visit: Payer: Medicare Other | Admitting: Cardiology

## 2020-06-20 ENCOUNTER — Other Ambulatory Visit: Payer: Self-pay

## 2020-06-20 ENCOUNTER — Encounter: Payer: Self-pay | Admitting: Cardiology

## 2020-06-20 ENCOUNTER — Other Ambulatory Visit: Payer: Self-pay | Admitting: "Endocrinology

## 2020-06-20 VITALS — BP 113/49 | HR 76 | Resp 16 | Ht 62.0 in | Wt 173.6 lb

## 2020-06-20 DIAGNOSIS — I2584 Coronary atherosclerosis due to calcified coronary lesion: Secondary | ICD-10-CM | POA: Diagnosis not present

## 2020-06-20 DIAGNOSIS — E6609 Other obesity due to excess calories: Secondary | ICD-10-CM | POA: Diagnosis not present

## 2020-06-20 DIAGNOSIS — Z6832 Body mass index (BMI) 32.0-32.9, adult: Secondary | ICD-10-CM | POA: Diagnosis not present

## 2020-06-20 DIAGNOSIS — Z712 Person consulting for explanation of examination or test findings: Secondary | ICD-10-CM

## 2020-06-20 DIAGNOSIS — Z8249 Family history of ischemic heart disease and other diseases of the circulatory system: Secondary | ICD-10-CM

## 2020-06-20 DIAGNOSIS — R0609 Other forms of dyspnea: Secondary | ICD-10-CM

## 2020-06-20 DIAGNOSIS — R0600 Dyspnea, unspecified: Secondary | ICD-10-CM | POA: Diagnosis not present

## 2020-06-20 DIAGNOSIS — I2510 Atherosclerotic heart disease of native coronary artery without angina pectoris: Secondary | ICD-10-CM

## 2020-06-20 DIAGNOSIS — R072 Precordial pain: Secondary | ICD-10-CM

## 2020-06-20 DIAGNOSIS — E785 Hyperlipidemia, unspecified: Secondary | ICD-10-CM

## 2020-06-20 DIAGNOSIS — I10 Essential (primary) hypertension: Secondary | ICD-10-CM | POA: Diagnosis not present

## 2020-06-20 DIAGNOSIS — E1165 Type 2 diabetes mellitus with hyperglycemia: Secondary | ICD-10-CM

## 2020-06-20 DIAGNOSIS — Z794 Long term (current) use of insulin: Secondary | ICD-10-CM | POA: Diagnosis not present

## 2020-06-20 MED ORDER — METOPROLOL TARTRATE 50 MG PO TABS: 50.0000 mg | ORAL_TABLET | Freq: Two times a day (BID) | ORAL | 0 refills | Status: DC

## 2020-06-20 MED ORDER — ASPIRIN EC 81 MG PO TBEC: 81.0000 mg | DELAYED_RELEASE_TABLET | Freq: Every day | ORAL | 3 refills | Status: DC

## 2020-06-20 NOTE — Progress Notes (Signed)
Date:  06/20/2020   ID:  [name], DOB 1961/03/14, MRN [id number]  PCP:  Glenda Chroman, MD  Cardiologist:  Rex Kras, DO, [facility] (established care 05/22/2020)  Chief Complaint  Patient presents with  . Dyspnea on exertion  . Follow-up    4 weeks     HPI  [name] is a 59 y.o. female who presents to the office with a chief complaint of "reevaluation of dyspnea." Patient's past medical history and cardiovascular risk factors include: Insulin-dependent diabetes mellitus type 2, hypertension with chronic kidney disease stage III, dyslipidemia, coronary artery calcification, obesity due to excess calories, family history of premature coronary artery disease.  Patient is referred to the office at the request of her primary care provider for evaluation of dyspnea on exertion.  During initial consultation patient was describing symptoms of effort related dyspnea which were progressive and given her multiple cardiovascular risk factors with concerning for underlying CAD.  We discussed proceeding with either left heart catheterization or coronary CTA and the shared decision was to proceed with coronary CTA.  Since last office visit patient has undergone a coronary CTA and patient is noted to have moderate coronary artery calcification but based on CT FFR calcified plaque not consistent with significant stenosis.  Symptomatically patient continues to state that her effort related dyspnea is still present.  She continues to have atypical chest pain over the left and the right anterior chest wall around the shoulder region, not brought on by effort related activities and does not resolve with rest.  The chest discomfort is usually self-limited.  She is attributing her tired and fatigue sensation to her underlying thyroid disease and diabetes.  Patient has not been evaluate by pulmonary medicine for underlying dyspnea on exertion.  Patient's blood pressure at today's office visit is under the  lower limits of normal.  Family history of premature CAD: Dad had MI at age of 40.   FUNCTIONAL STATUS: Walks her dogs 20-30 minutes twice a day.    ALLERGIES: Allergies  Allergen Reactions  . Flagyl [Metronidazole Hcl] Itching and Rash  . Nsaids Nausea Only    MEDICATION LIST PRIOR TO VISIT: Current Meds  Medication Sig  . acetaZOLAMIDE (DIAMOX) 250 MG tablet Take 250 mg by mouth daily.   Marland Kitchen acyclovir (ZOVIRAX) 400 MG tablet Take 400 mg by mouth 3 (three) times daily as needed (for fever blisters).   Marland Kitchen atorvastatin (LIPITOR) 20 MG tablet TAKE ONE TABLET BY MOUTH DAILY (Patient taking differently: Take 20 mg by mouth at bedtime. )  . baclofen (LIORESAL) 10 MG tablet   . Cholecalciferol (VITAMIN D3) 2000 units capsule Take 4,000 Units by mouth daily.   . Continuous Blood Gluc Sensor (FREESTYLE LIBRE 14 DAY SENSOR) MISC Inject 1 each into the skin every 14 (fourteen) days. Use as directed.  . cyanocobalamin (,VITAMIN B-12,) 1000 MCG/ML injection Inject 1,000 mcg into the muscle every 30 (thirty) days.  . diazepam (VALIUM) 2 MG tablet Take 5 mg by mouth every 12 (twelve) hours as needed for anxiety.   . dicyclomine (BENTYL) 20 MG tablet TAKE ONE TABLET BY MOUTH THREE TIMES DAILY  . DULoxetine (CYMBALTA) 60 MG capsule Take 60 mg by mouth daily.  Marland Kitchen estradiol (ESTRACE) 0.5 MG tablet Take 0.5 mg by mouth every evening.   . fluconazole (DIFLUCAN) 150 MG tablet Take 150 mg by mouth as needed.   . fluticasone (FLONASE) 50 MCG/ACT nasal spray Place 2 sprays into both nostrils every morning.   Marland Kitchen  glucose blood (FREESTYLE LITE) test strip Use as instructed  . hydrochlorothiazide (HYDRODIURIL) 25 MG tablet Take 25 mg by mouth daily.   Marland Kitchen HYDROcodone-acetaminophen (NORCO/VICODIN) 5-325 MG tablet Take 1 tablet by mouth every 6 (six) hours as needed for severe pain.  Marland Kitchen insulin aspart (NOVOLOG FLEXPEN) 100 UNIT/ML FlexPen INJECT 15-22 UNITS INTO THE SKIN THREE TIMES DAILY. TAKE WITH MEALS.  Marland Kitchen insulin  degludec (TRESIBA FLEXTOUCH) 200 UNIT/ML FlexTouch Pen Inject 70 Units into the skin at bedtime.  Marland Kitchen levothyroxine (SYNTHROID) 100 MCG tablet TAKE ONE TABLET BY MOUTH DAILY BEFORE BREAKFAST  . lisinopril (ZESTRIL) 20 MG tablet Take 20 mg by mouth daily.   . metFORMIN (GLUCOPHAGE) 500 MG tablet TAKE ONE TABLET BY MOUTH TWICE DAILY. TAKE WITH A MEAL.  Marland Kitchen ondansetron (ZOFRAN) 4 MG tablet Take 1 tablet (4 mg total) by mouth every 8 (eight) hours as needed for nausea or vomiting.  . pantoprazole (PROTONIX) 40 MG tablet 40 mg daily before breakfast.   . rifaximin (XIFAXAN) 550 MG TABS tablet Take 1 tablet (550 mg total) by mouth 3 (three) times daily.  . Turmeric 500 MG CAPS Take 500 mg by mouth daily.   . [DISCONTINUED] metoprolol tartrate (LOPRESSOR) 50 MG tablet Take 0.5 tablets (25 mg total) by mouth 2 (two) times daily.     PAST MEDICAL HISTORY: Past Medical History:  Diagnosis Date  . Anxiety disorder   . Chronic low back pain   . Cirrhosis of liver ([place])   . Colitis   . Depression   . Diabetes mellitus    Type II  . Diabetic neuropathy ([place])   . Diverticulitis   . Fatty liver   . GERD (gastroesophageal reflux disease)   . High cholesterol   . Hypertension   . Irritable bowel syndrome   . Meniere's disease   . Neuropathy   . Pain     PAST SURGICAL HISTORY: Past Surgical History:  Procedure Laterality Date  . ABDOMINAL HYSTERECTOMY    . BIOPSY  03/20/2017   Procedure: BIOPSY;  Surgeon: [name], MD;  [facility];  Service: Endoscopy;;  colon gastric  . bladder tact  2005  . CHOLECYSTECTOMY  08/11  . COLONOSCOPY  2208  . COLONOSCOPY  In of September 2014   @ [facility]/Dr,.[name]  . COLONOSCOPY WITH PROPOFOL N/A 03/20/2017   Procedure: COLONOSCOPY WITH PROPOFOL;  Surgeon: [name], MD;  [facility];  Service: Endoscopy;  Laterality: N/A;  . ECTOPIC PREGNANCY SURGERY  1996  . ESOPHAGOGASTRODUODENOSCOPY (EGD) WITH PROPOFOL N/A 03/20/2017    Procedure: ESOPHAGOGASTRODUODENOSCOPY (EGD) WITH PROPOFOL;  Surgeon: [name], MD;  [facility];  Service: Endoscopy;  Laterality: N/A;  12:45  . HERNIA REPAIR  09/17/11  . LIGAMENT REPAIR Left 02/10/2018   Procedure: Left  ANKLE ATFL Repair and Ligament Augmentation, Injection of Tendon Splint Application;  Surgeon: [name], DPM;  Location: [place];  Service: Podiatry;  Laterality: Left;  . [place]  2009   Preformed on the right shoulder  . OVARIAN CYST REMOVAL     right side  . PARTIAL HYSTERECTOMY  2005  . POLYPECTOMY  03/20/2017   Procedure: POLYPECTOMY;  Surgeon: [name], MD;  [facility];  Service: Endoscopy;;  colon  . TENDON REPAIR Left 02/10/2018   Procedure: PERONEAL TENDON REPAIR and Synovectomy Peroneal Tendon;  Surgeon: [name], DPM;  Location: [place];  Service: Podiatry;  Laterality: Left;  . UPPER  GASTROINTESTINAL ENDOSCOPY  2008  . URETERAL STENT PLACEMENT      FAMILY HISTORY: The patient family history includes Esophageal cancer in her brother; Healthy in her mother, son, and son; Heart disease in her father.  SOCIAL HISTORY:  The patient  reports that she has never smoked. She has never used smokeless tobacco. She reports that she does not drink alcohol and does not use drugs.  REVIEW OF SYSTEMS: Review of Systems  Constitutional: Positive for malaise/fatigue. Negative for chills and fever.  HENT: Negative for hoarse voice and nosebleeds.   Eyes: Negative for discharge, double vision and pain.  Cardiovascular: Positive for chest pain (left and right anterior chest wall. ) and dyspnea on exertion. Negative for claudication, leg swelling, near-syncope, orthopnea, palpitations, paroxysmal nocturnal dyspnea and syncope.  Respiratory: Negative for hemoptysis and shortness of breath.   Musculoskeletal: Negative for muscle cramps and myalgias.  Gastrointestinal: Negative for abdominal pain, constipation, diarrhea,  hematemesis, hematochezia, melena, nausea and vomiting.  Neurological: Negative for dizziness and light-headedness.    PHYSICAL EXAM: Vitals with BMI 06/20/2020 06/14/2020 06/14/2020  Height 5' 2"  - -  Weight 173 lbs 10 oz - -  BMI 89.37 - -  Systolic [phone]  Diastolic 49 68 69  Pulse 76 80 73    CONSTITUTIONAL: Well-developed and well-nourished. No acute distress.  SKIN: Skin is warm and dry. No rash noted. No cyanosis. No pallor. No jaundice HEAD: Normocephalic and atraumatic.  EYES: No scleral icterus MOUTH/THROAT: Moist oral membranes.  NECK: No JVD present. No thyromegaly noted. No carotid bruits  LYMPHATIC: No visible cervical adenopathy.  CHEST Normal respiratory effort. No intercostal retractions  LUNGS: Clear to auscultation bilaterally.  No stridor. No wheezes. No rales.  CARDIOVASCULAR: Regular rate and rhythm, positive X7-W6, soft holosystolic murmur heard at the apex, no gallops or rubs ABDOMINAL: No apparent ascites.  EXTREMITIES: No peripheral edema  HEMATOLOGIC: No significant bruising NEUROLOGIC: Oriented to person, place, and time. Nonfocal. Normal muscle tone.  PSYCHIATRIC: Normal mood and affect. Normal behavior. Cooperative  CARDIAC DATABASE: EKG: 05/22/2020: Normal sinus rhythm, 85 bpm, poor R wave progression, without underlying ischemia or injury pattern.    Echocardiogram: 04/23/2020 performed at [facility] internal medicine: Per report patient's LVEF is 55 to 60%, Doppler evidence of left ventricular diastolic dysfunction, aortic valve sclerosis without stenosis, trace MR, mild TR, RVSP 19 mmHg.  Stress Testing: NA  Heart Catheterization: None  Coronary CTA 06/2020: Coronary calcium score of 105. This was 37th percentile for age and sex matched control. Normal coronary origin with right dominance. CADRADS = 3. Calcified plaque noted at the level of the trifurcation giving rise to moderate stenosis at the level of the ostial LAD and ostial ramus. Severity  of the lesion may be overestimated due to blooming artifact. Therefore the study will be sent to CT FFR for functional assessment. Aortic atherosclerosis. CT FFR analysis showed no significant stenosis.  LABORATORY DATA: CBC Latest Ref Rng & Units 06/05/2020 05/27/2019 02/10/2018  WBC 3.8 - 10.8 Thousand/uL 10.8 17.1(H) 13.9(H)  Hemoglobin 11.7 - 15.5 g/dL 13.2 14.5 13.4  Hematocrit 35 - 45 % 39.0 44.3 39.9  Platelets 140 - 400 Thousand/uL 205 266 189    CMP Latest Ref Rng & Units 06/05/2020 05/22/2020 01/24/2020  Glucose 65 - 99 mg/dL - 94 43(L)  BUN 6 - 24 mg/dL - 23 23  Creatinine 0.57 - 1.00 mg/dL - 1.05(H) 0.89  Sodium 134 - 144 mmol/L - 142 141  Potassium 3.5 -  5.2 mmol/L - 4.0 3.8  Chloride 96 - 106 mmol/L - 105 105  CO2 20 - 29 mmol/L - 25 27  Calcium 8.7 - 10.2 mg/dL - 9.7 9.4  Total Protein 6.1 - 8.1 g/dL 7.6 - 7.3  Total Bilirubin 0.2 - 1.2 mg/dL 0.6 - 0.5  Alkaline Phos 38 - 126 U/L - - -  AST 10 - 35 U/L 36(H) - 26  ALT 6 - 29 U/L 23 - 25    Lipid Panel     Component Value Date/Time   CHOL 165 01/24/2020 0754   TRIG 107 01/24/2020 0754   HDL 49 (L) 01/24/2020 0754   CHOLHDL 3.4 01/24/2020 0754   VLDL 29 06/30/2016 1251   LDLCALC 96 01/24/2020 0754    No components found for: NTPROBNP No results for input(s): PROBNP in the last 8760 hours. Recent Labs    07/28/19 0822 01/24/20 0754  TSH 8.57* 0.53    BMP Recent Labs    07/28/19 0822 01/24/20 0754 05/22/20 1625  NA 138 141 142  K 3.7 3.8 4.0  CL 104 105 105  CO2 25 27 25   GLUCOSE 79 43* 94  BUN 23 23 23   CREATININE 0.95 0.89 1.05*  CALCIUM 9.6 9.4 9.7  GFRNONAA 66 71 59*  GFRAA 77 83 68    HEMOGLOBIN A1C Lab Results  Component Value Date   HGBA1C 7.7 (A) 05/09/2020   MPG 206 07/28/2019   External Labs: Collected: 03/08/2020 Creatinine 1.28 mg/dL. eGFR: 46 mL/min per 1.73 m  IMPRESSION:    ICD-10-CM   1. Coronary atherosclerosis due to calcified coronary lesion of native artery  I25.10  aspirin EC 81 MG tablet   I25.84 metoprolol tartrate (LOPRESSOR) 50 MG tablet  2. Coronary artery calcification seen on computed tomography  I25.10   3. Encounter to discuss test results  Z71.2   4. Family history of premature CAD  Z82.49   5. Dyspnea on exertion  R06.00 metoprolol tartrate (LOPRESSOR) 50 MG tablet    B Nat Peptide    Basic metabolic panel  6. Precordial chest pain  R07.2   7. Type 2 diabetes mellitus with hyperglycemia, with long-term current use of insulin (HCC)  E11.65    Z79.4   8. Long-term insulin use (HCC)  Z79.4   9. Benign hypertension  I10   10. Dyslipidemia  E78.5   11. Class 1 obesity due to excess calories with serious comorbidity and body mass index (BMI) of 32.0 to 32.9 in adult  E66.09    Z68.32      RECOMMENDATIONS: ALAYASIA BREEDING is a 59 y.o. female whose past medical history and cardiac risk factors include: Insulin-dependent diabetes mellitus type 2, hypertension with chronic kidney disease stage III, dyslipidemia, coronary artery calcification, obesity due to excess calories, family history of premature coronary artery disease.  Coronary artery calcification: Start aspirin 81 mg p.o. daily patient is instructed to increase Lipitor 20 mg p.o. nightly.  Continue to improve her cardiovascular risk factors.  Dyspnea on exertion:  Given her effort related dyspnea and multiple cardiovascular risk factors patient underwent coronary CTA to evaluate for obstructive disease.  Results as well as the images of the coronary CTA were reviewed with her in great detail at today's office visit.  Patient does have underlying nonobstructive CAD for which pharmacological therapy and improving her cardiovascular risk factors is warranted.    We will increase Lopressor to 50 mg p.o. twice daily   Continue ACE inhibitors.  We will start aspirin.    Continue statin therapy   Check BMP and BNP   She may also benefit from pulmonary evaluation.  She will discuss this  further with her prior PCP.  Family history of premature coronary disease: Continue statin therapy.  See discussion above.  Benign essential hypertension with chronic kidney disease stage III . Medication reconciled.  . Currently managed by primary care provider. . Low salt diet recommended. A diet that is rich in fruits, vegetables, legumes, and low-fat dairy products and low in snacks, sweets, and meats (such as the Dietary Approaches to Stop Hypertension [DASH] diet).   Dyslipidemia: Continue statin therapy.  Currently managed by primary care provider.  FINAL MEDICATION LIST END OF ENCOUNTER: Meds ordered this encounter  Medications  . aspirin EC 81 MG tablet    Sig: Take 1 tablet (81 mg total) by mouth daily. Swallow whole.    Dispense:  90 tablet    Refill:  3  . metoprolol tartrate (LOPRESSOR) 50 MG tablet    Sig: Take 1 tablet (50 mg total) by mouth 2 (two) times daily.    Dispense:  60 tablet    Refill:  0    Medications Discontinued During This Encounter  Medication Reason  . PRODIGY TWIST TOP LANCETS 28G MISC Discontinued by provider  . metoprolol tartrate (LOPRESSOR) 50 MG tablet Dose change     Current Outpatient Medications:  .  acetaZOLAMIDE (DIAMOX) 250 MG tablet, Take 250 mg by mouth daily. , Disp: , Rfl:  .  acyclovir (ZOVIRAX) 400 MG tablet, Take 400 mg by mouth 3 (three) times daily as needed (for fever blisters). , Disp: , Rfl:  .  atorvastatin (LIPITOR) 20 MG tablet, TAKE ONE TABLET BY MOUTH DAILY (Patient taking differently: Take 20 mg by mouth at bedtime. ), Disp: 90 tablet, Rfl: 0 .  baclofen (LIORESAL) 10 MG tablet, , Disp: , Rfl:  .  Cholecalciferol (VITAMIN D3) 2000 units capsule, Take 4,000 Units by mouth daily. , Disp: , Rfl:  .  Continuous Blood Gluc Sensor (FREESTYLE LIBRE 14 DAY SENSOR) MISC, Inject 1 each into the skin every 14 (fourteen) days. Use as directed., Disp: 2 each, Rfl: 2 .  cyanocobalamin (,VITAMIN B-12,) 1000 MCG/ML injection, Inject  1,000 mcg into the muscle every 30 (thirty) days., Disp: , Rfl: 5 .  diazepam (VALIUM) 2 MG tablet, Take 5 mg by mouth every 12 (twelve) hours as needed for anxiety. , Disp: , Rfl:  .  dicyclomine (BENTYL) 20 MG tablet, TAKE ONE TABLET BY MOUTH THREE TIMES DAILY, Disp: 90 tablet, Rfl: 5 .  DULoxetine (CYMBALTA) 60 MG capsule, Take 60 mg by mouth daily., Disp: , Rfl:  .  estradiol (ESTRACE) 0.5 MG tablet, Take 0.5 mg by mouth every evening. , Disp: , Rfl:  .  fluconazole (DIFLUCAN) 150 MG tablet, Take 150 mg by mouth as needed. , Disp: , Rfl:  .  fluticasone (FLONASE) 50 MCG/ACT nasal spray, Place 2 sprays into both nostrils every morning. , Disp: , Rfl:  .  glucose blood (FREESTYLE LITE) test strip, Use as instructed, Disp: 100 each, Rfl: 2 .  hydrochlorothiazide (HYDRODIURIL) 25 MG tablet, Take 25 mg by mouth daily. , Disp: , Rfl:  .  HYDROcodone-acetaminophen (NORCO/VICODIN) 5-325 MG tablet, Take 1 tablet by mouth every 6 (six) hours as needed for severe pain., Disp: 12 tablet, Rfl: 0 .  insulin aspart (NOVOLOG FLEXPEN) 100 UNIT/ML FlexPen, INJECT 15-22 UNITS INTO THE SKIN THREE TIMES DAILY.  TAKE WITH MEALS., Disp: 15 mL, Rfl: 3 .  insulin degludec (TRESIBA FLEXTOUCH) 200 UNIT/ML FlexTouch Pen, Inject 70 Units into the skin at bedtime., Disp: 15 mL, Rfl: 3 .  levothyroxine (SYNTHROID) 100 MCG tablet, TAKE ONE TABLET BY MOUTH DAILY BEFORE BREAKFAST, Disp: 90 tablet, Rfl: 0 .  lisinopril (ZESTRIL) 20 MG tablet, Take 20 mg by mouth daily. , Disp: , Rfl:  .  metFORMIN (GLUCOPHAGE) 500 MG tablet, TAKE ONE TABLET BY MOUTH TWICE DAILY. TAKE WITH A MEAL., Disp: 180 tablet, Rfl: 0 .  ondansetron (ZOFRAN) 4 MG tablet, Take 1 tablet (4 mg total) by mouth every 8 (eight) hours as needed for nausea or vomiting., Disp: 12 tablet, Rfl: 0 .  pantoprazole (PROTONIX) 40 MG tablet, 40 mg daily before breakfast. , Disp: , Rfl: 4 .  rifaximin (XIFAXAN) 550 MG TABS tablet, Take 1 tablet (550 mg total) by mouth 3  (three) times daily., Disp: 42 tablet, Rfl: 0 .  Turmeric 500 MG CAPS, Take 500 mg by mouth daily. , Disp: , Rfl:  .  aspirin EC 81 MG tablet, Take 1 tablet (81 mg total) by mouth daily. Swallow whole., Disp: 90 tablet, Rfl: 3 .  metoprolol tartrate (LOPRESSOR) 50 MG tablet, Take 1 tablet (50 mg total) by mouth 2 (two) times daily., Disp: 60 tablet, Rfl: 0  Orders Placed This Encounter  Procedures  . B Nat Peptide  . Basic metabolic panel    There are no Patient Instructions on file for this visit.   --Continue cardiac medications as reconciled in final medication list. --Return in about 3 months (around 09/19/2020) for Reevaluation of, Dyspnea. Or sooner if needed. --Continue follow-up with your primary care physician regarding the management of your other chronic comorbid conditions.  Patient's questions and concerns were addressed to her satisfaction. She voices understanding of the instructions provided during this encounter.   This note was created using a voice recognition software as a result there may be grammatical errors inadvertently enclosed that do not reflect the nature of this encounter. Every attempt is made to correct such errors.  Rex Kras, [place], [facility]  Pager: [phone] Office: [phone]

## 2020-06-21 ENCOUNTER — Telehealth (INDEPENDENT_AMBULATORY_CARE_PROVIDER_SITE_OTHER): Payer: Self-pay | Admitting: *Deleted

## 2020-06-21 ENCOUNTER — Ambulatory Visit (HOSPITAL_COMMUNITY): Payer: Medicare Other

## 2020-06-21 ENCOUNTER — Other Ambulatory Visit: Payer: Self-pay

## 2020-06-21 DIAGNOSIS — R072 Precordial pain: Secondary | ICD-10-CM

## 2020-06-21 DIAGNOSIS — Z79899 Other long term (current) drug therapy: Secondary | ICD-10-CM | POA: Diagnosis not present

## 2020-06-21 DIAGNOSIS — E1165 Type 2 diabetes mellitus with hyperglycemia: Secondary | ICD-10-CM | POA: Diagnosis not present

## 2020-06-21 DIAGNOSIS — I10 Essential (primary) hypertension: Secondary | ICD-10-CM | POA: Diagnosis not present

## 2020-06-21 DIAGNOSIS — M545 Low back pain: Secondary | ICD-10-CM | POA: Diagnosis not present

## 2020-06-21 DIAGNOSIS — E1142 Type 2 diabetes mellitus with diabetic polyneuropathy: Secondary | ICD-10-CM | POA: Diagnosis not present

## 2020-06-21 DIAGNOSIS — R0609 Other forms of dyspnea: Secondary | ICD-10-CM

## 2020-06-21 DIAGNOSIS — Z6832 Body mass index (BMI) 32.0-32.9, adult: Secondary | ICD-10-CM | POA: Diagnosis not present

## 2020-06-21 DIAGNOSIS — Z299 Encounter for prophylactic measures, unspecified: Secondary | ICD-10-CM | POA: Diagnosis not present

## 2020-06-21 DIAGNOSIS — Z794 Long term (current) use of insulin: Secondary | ICD-10-CM | POA: Diagnosis not present

## 2020-06-21 LAB — BRAIN NATRIURETIC PEPTIDE: BNP: 13.3 pg/mL (ref 0.0–100.0)

## 2020-06-21 LAB — BASIC METABOLIC PANEL
BUN/Creatinine Ratio: 21 (ref 9–23)
BUN: 29 mg/dL — ABNORMAL HIGH (ref 6–24)
CO2: 21 mmol/L (ref 20–29)
Calcium: 10.1 mg/dL (ref 8.7–10.2)
Chloride: 101 mmol/L (ref 96–106)
Creatinine, Ser: 1.4 mg/dL — ABNORMAL HIGH (ref 0.57–1.00)
GFR calc Af Amer: 48 mL/min/{1.73_m2} — ABNORMAL LOW (ref >59)
GFR calc non Af Amer: 41 mL/min/{1.73_m2} — ABNORMAL LOW (ref >59)
Glucose: 91 mg/dL (ref 65–99)
Potassium: 4.2 mmol/L (ref 3.5–5.2)
Sodium: 137 mmol/L (ref 134–144)

## 2020-06-21 NOTE — Telephone Encounter (Signed)
Patient called but no answer. Will try again tomorrow.

## 2020-06-21 NOTE — Telephone Encounter (Signed)
Patient left the following message on voicemail.  She did not keep U/S this morning because she was sick yesterday and could not make it this morning.  She apologizes and ask that another appointment be made. She is requesting that this appointment be made later in the day even though she knows that she has to fast.   Secondly she is concerned that the [place] may be causing what she experienced yesterday. She has 7 more days of therapy to take. She has liked the medication up until yesterday , she was traveling to [facility] to see Cardiologist. Stomach felt like her insides were falling out. She did have a BM , it was whole, and soft. She also had vomiting. She comments that she has had this before occasionally due to her diabetes. She ask if the medication may be to strong for her?  Call back number [phone]. Forwarded to Lelon Frohlich to reschedule her Ultrasound and for later in the day.

## 2020-06-26 NOTE — Telephone Encounter (Signed)
Patient was called on Friday but she did not answer Patient states she had a bad spell of cramping and diarrhea that day.. She has  few more days of Xifaxan left. She will call office when she finishes antibiotic

## 2020-06-27 ENCOUNTER — Ambulatory Visit (HOSPITAL_COMMUNITY): Payer: Medicare Other

## 2020-06-28 ENCOUNTER — Inpatient Hospital Stay: Admission: RE | Admit: 2020-06-28 | Payer: Medicare Other | Source: Ambulatory Visit

## 2020-07-04 DIAGNOSIS — R0600 Dyspnea, unspecified: Secondary | ICD-10-CM | POA: Diagnosis not present

## 2020-07-04 DIAGNOSIS — R072 Precordial pain: Secondary | ICD-10-CM | POA: Diagnosis not present

## 2020-07-05 DIAGNOSIS — E119 Type 2 diabetes mellitus without complications: Secondary | ICD-10-CM | POA: Diagnosis not present

## 2020-07-05 DIAGNOSIS — M159 Polyosteoarthritis, unspecified: Secondary | ICD-10-CM | POA: Diagnosis not present

## 2020-07-05 DIAGNOSIS — I10 Essential (primary) hypertension: Secondary | ICD-10-CM | POA: Diagnosis not present

## 2020-07-05 DIAGNOSIS — H40013 Open angle with borderline findings, low risk, bilateral: Secondary | ICD-10-CM | POA: Diagnosis not present

## 2020-07-05 LAB — BASIC METABOLIC PANEL
BUN/Creatinine Ratio: 14 (ref 9–23)
BUN: 14 mg/dL (ref 6–24)
CO2: 23 mmol/L (ref 20–29)
Calcium: 8.8 mg/dL (ref 8.7–10.2)
Chloride: 106 mmol/L (ref 96–106)
Creatinine, Ser: 1.01 mg/dL — ABNORMAL HIGH (ref 0.57–1.00)
GFR calc Af Amer: 71 mL/min/{1.73_m2} (ref >59)
GFR calc non Af Amer: 62 mL/min/{1.73_m2} (ref >59)
Glucose: 148 mg/dL — ABNORMAL HIGH (ref 65–99)
Potassium: 4.6 mmol/L (ref 3.5–5.2)
Sodium: 142 mmol/L (ref 134–144)

## 2020-07-05 NOTE — Progress Notes (Signed)
Spoke with patient, patient voiced understanding.

## 2020-07-10 ENCOUNTER — Inpatient Hospital Stay: Admission: RE | Admit: 2020-07-10 | Payer: Medicare Other | Source: Ambulatory Visit

## 2020-07-23 DIAGNOSIS — E1165 Type 2 diabetes mellitus with hyperglycemia: Secondary | ICD-10-CM | POA: Diagnosis not present

## 2020-07-23 DIAGNOSIS — M545 Low back pain, unspecified: Secondary | ICD-10-CM | POA: Diagnosis not present

## 2020-07-23 DIAGNOSIS — R0600 Dyspnea, unspecified: Secondary | ICD-10-CM | POA: Diagnosis not present

## 2020-07-23 DIAGNOSIS — Z299 Encounter for prophylactic measures, unspecified: Secondary | ICD-10-CM | POA: Diagnosis not present

## 2020-07-23 DIAGNOSIS — F419 Anxiety disorder, unspecified: Secondary | ICD-10-CM | POA: Diagnosis not present

## 2020-07-24 ENCOUNTER — Ambulatory Visit
Admission: RE | Admit: 2020-07-24 | Discharge: 2020-07-24 | Disposition: A | Discharge status: Home / Self Care | Payer: Medicare Other | Source: Ambulatory Visit | Attending: Nurse Practitioner | Admitting: Nurse Practitioner

## 2020-07-24 DIAGNOSIS — M545 Low back pain, unspecified: Secondary | ICD-10-CM

## 2020-07-24 DIAGNOSIS — G8929 Other chronic pain: Secondary | ICD-10-CM

## 2020-07-24 NOTE — Discharge Instructions (Signed)

## 2020-08-02 ENCOUNTER — Institutional Professional Consult (permissible substitution): Payer: Medicare Other | Admitting: Pulmonary Disease

## 2020-08-03 DIAGNOSIS — I10 Essential (primary) hypertension: Secondary | ICD-10-CM | POA: Diagnosis not present

## 2020-08-03 DIAGNOSIS — E119 Type 2 diabetes mellitus without complications: Secondary | ICD-10-CM | POA: Diagnosis not present

## 2020-08-03 DIAGNOSIS — M159 Polyosteoarthritis, unspecified: Secondary | ICD-10-CM | POA: Diagnosis not present

## 2020-08-11 ENCOUNTER — Other Ambulatory Visit: Payer: Self-pay | Admitting: Cardiology

## 2020-08-11 DIAGNOSIS — I2584 Coronary atherosclerosis due to calcified coronary lesion: Secondary | ICD-10-CM

## 2020-08-11 DIAGNOSIS — I2510 Atherosclerotic heart disease of native coronary artery without angina pectoris: Secondary | ICD-10-CM

## 2020-08-11 DIAGNOSIS — R0600 Dyspnea, unspecified: Secondary | ICD-10-CM

## 2020-08-11 DIAGNOSIS — R0609 Other forms of dyspnea: Secondary | ICD-10-CM

## 2020-08-16 ENCOUNTER — Other Ambulatory Visit: Payer: Self-pay | Admitting: Cardiology

## 2020-08-16 DIAGNOSIS — R0600 Dyspnea, unspecified: Secondary | ICD-10-CM

## 2020-08-16 DIAGNOSIS — I2510 Atherosclerotic heart disease of native coronary artery without angina pectoris: Secondary | ICD-10-CM

## 2020-08-16 DIAGNOSIS — R0609 Other forms of dyspnea: Secondary | ICD-10-CM

## 2020-08-17 ENCOUNTER — Other Ambulatory Visit: Payer: Self-pay

## 2020-08-17 ENCOUNTER — Ambulatory Visit
Admission: RE | Admit: 2020-08-17 | Discharge: 2020-08-17 | Disposition: A | Discharge status: Home / Self Care | Payer: Medicare Other | Source: Ambulatory Visit | Attending: Nurse Practitioner | Admitting: Nurse Practitioner

## 2020-08-17 DIAGNOSIS — M545 Low back pain, unspecified: Secondary | ICD-10-CM | POA: Diagnosis not present

## 2020-08-17 MED ORDER — METHYLPREDNISOLONE ACETATE 40 MG/ML INJ SUSP (RADIOLOG
120.0000 mg | Freq: Once | INTRAMUSCULAR | Status: AC
  Administered 2020-08-17: 120 mg via EPIDURAL

## 2020-08-17 MED ORDER — IOPAMIDOL (ISOVUE-M 200) INJECTION 41%
1.0000 mL | Freq: Once | INTRAMUSCULAR | Status: AC
  Administered 2020-08-17: 1 mL via EPIDURAL

## 2020-08-17 NOTE — Discharge Instructions (Signed)

## 2020-08-23 ENCOUNTER — Telehealth: Payer: Self-pay

## 2020-08-23 ENCOUNTER — Other Ambulatory Visit: Payer: Self-pay

## 2020-08-23 DIAGNOSIS — IMO0002 Reserved for concepts with insufficient information to code with codable children: Secondary | ICD-10-CM

## 2020-08-23 DIAGNOSIS — E1165 Type 2 diabetes mellitus with hyperglycemia: Secondary | ICD-10-CM

## 2020-08-23 MED ORDER — INSULIN ASPART 100 UNIT/ML ~~LOC~~ SOLN: 15.0000 [IU] | Freq: Three times a day (TID) | SUBCUTANEOUS | 1 refills | Status: DC

## 2020-08-23 MED ORDER — TRESIBA 100 UNIT/ML ~~LOC~~ SOLN: 70.0000 [IU] | Freq: Every day | SUBCUTANEOUS | 1 refills | Status: DC

## 2020-08-23 MED ORDER — "INSULIN SYRINGE-NEEDLE U-100 31G X 5/16"" 1 ML MISC": 3 refills | Status: DC

## 2020-08-23 NOTE — Telephone Encounter (Signed)
Did they give her an estimated time they think it will be in by?  She can pick up some samples here or even try a different pharmacy.

## 2020-08-23 NOTE — Telephone Encounter (Signed)
Please advise 

## 2020-08-23 NOTE — Telephone Encounter (Signed)
Spoke with patient , she will use vials of insulin and syringes until back order of pens I sin stock, sent in scripts.

## 2020-08-23 NOTE — Telephone Encounter (Signed)
If they can switch her to vials, I think she would be ok with that.

## 2020-08-23 NOTE — Telephone Encounter (Signed)
It is a mechanism that is used in the insulin pens that are the problem, the pharmacist that I spoke with earlier doesn't know how long it will be, asking for patients that are willing and capable to get vials and syringes.

## 2020-08-23 NOTE — Telephone Encounter (Signed)
Pt left a VM that her [name] and [name] is on back order. What can this patient take in place? She would like a return call as well.

## 2020-08-28 DIAGNOSIS — Z299 Encounter for prophylactic measures, unspecified: Secondary | ICD-10-CM | POA: Diagnosis not present

## 2020-08-28 DIAGNOSIS — I10 Essential (primary) hypertension: Secondary | ICD-10-CM | POA: Diagnosis not present

## 2020-08-28 DIAGNOSIS — M545 Low back pain, unspecified: Secondary | ICD-10-CM | POA: Diagnosis not present

## 2020-08-28 DIAGNOSIS — Z794 Long term (current) use of insulin: Secondary | ICD-10-CM | POA: Diagnosis not present

## 2020-08-28 DIAGNOSIS — E1165 Type 2 diabetes mellitus with hyperglycemia: Secondary | ICD-10-CM | POA: Diagnosis not present

## 2020-09-04 DIAGNOSIS — E118 Type 2 diabetes mellitus with unspecified complications: Secondary | ICD-10-CM | POA: Diagnosis not present

## 2020-09-04 DIAGNOSIS — E119 Type 2 diabetes mellitus without complications: Secondary | ICD-10-CM | POA: Diagnosis not present

## 2020-09-04 DIAGNOSIS — M159 Polyosteoarthritis, unspecified: Secondary | ICD-10-CM | POA: Diagnosis not present

## 2020-09-04 DIAGNOSIS — Z794 Long term (current) use of insulin: Secondary | ICD-10-CM | POA: Diagnosis not present

## 2020-09-04 DIAGNOSIS — I10 Essential (primary) hypertension: Secondary | ICD-10-CM | POA: Diagnosis not present

## 2020-09-04 DIAGNOSIS — E1165 Type 2 diabetes mellitus with hyperglycemia: Secondary | ICD-10-CM | POA: Diagnosis not present

## 2020-09-04 DIAGNOSIS — E039 Hypothyroidism, unspecified: Secondary | ICD-10-CM | POA: Diagnosis not present

## 2020-09-05 LAB — COMPREHENSIVE METABOLIC PANEL
ALT: 31 IU/L (ref 0–32)
AST: 36 IU/L (ref 0–40)
Albumin/Globulin Ratio: 1.4 (ref 1.2–2.2)
Albumin: 3.9 g/dL (ref 3.8–4.9)
Alkaline Phosphatase: 108 IU/L (ref 44–121)
BUN/Creatinine Ratio: 22 (ref 9–23)
BUN: 20 mg/dL (ref 6–24)
Bilirubin Total: 0.3 mg/dL (ref 0.0–1.2)
CO2: 25 mmol/L (ref 20–29)
Calcium: 9.1 mg/dL (ref 8.7–10.2)
Chloride: 108 mmol/L — ABNORMAL HIGH (ref 96–106)
Creatinine, Ser: 0.89 mg/dL (ref 0.57–1.00)
GFR calc Af Amer: 83 mL/min/{1.73_m2} (ref >59)
GFR calc non Af Amer: 72 mL/min/{1.73_m2} (ref >59)
Globulin, Total: 2.8 g/dL (ref 1.5–4.5)
Glucose: 51 mg/dL — ABNORMAL LOW (ref 65–99)
Potassium: 4.2 mmol/L (ref 3.5–5.2)
Sodium: 141 mmol/L (ref 134–144)
Total Protein: 6.7 g/dL (ref 6.0–8.5)

## 2020-09-05 LAB — TSH: TSH: 2.51 u[IU]/mL (ref 0.450–4.500)

## 2020-09-05 LAB — T4, FREE: Free T4: 1.11 ng/dL (ref 0.82–1.77)

## 2020-09-10 ENCOUNTER — Ambulatory Visit: Payer: Medicare Other | Admitting: Nurse Practitioner

## 2020-09-10 NOTE — Patient Instructions (Incomplete)

## 2020-09-11 ENCOUNTER — Institutional Professional Consult (permissible substitution): Payer: Medicare Other | Admitting: Pulmonary Disease

## 2020-09-13 ENCOUNTER — Other Ambulatory Visit: Payer: Self-pay

## 2020-09-13 DIAGNOSIS — E039 Hypothyroidism, unspecified: Secondary | ICD-10-CM

## 2020-09-13 MED ORDER — LEVOTHYROXINE SODIUM 100 MCG PO TABS: ORAL_TABLET | ORAL | 0 refills | Status: DC

## 2020-09-14 ENCOUNTER — Other Ambulatory Visit: Payer: Self-pay

## 2020-09-14 ENCOUNTER — Ambulatory Visit (INDEPENDENT_AMBULATORY_CARE_PROVIDER_SITE_OTHER): Payer: Medicare Other | Admitting: Pulmonary Disease

## 2020-09-14 ENCOUNTER — Encounter: Payer: Self-pay | Admitting: Pulmonary Disease

## 2020-09-14 VITALS — BP 124/72 | HR 65 | Temp 97.3°F | Ht 61.0 in | Wt 171.0 lb

## 2020-09-14 DIAGNOSIS — I2510 Atherosclerotic heart disease of native coronary artery without angina pectoris: Secondary | ICD-10-CM

## 2020-09-14 DIAGNOSIS — R0609 Other forms of dyspnea: Secondary | ICD-10-CM

## 2020-09-14 DIAGNOSIS — R0600 Dyspnea, unspecified: Secondary | ICD-10-CM

## 2020-09-14 DIAGNOSIS — R0683 Snoring: Secondary | ICD-10-CM

## 2020-09-14 DIAGNOSIS — I2584 Coronary atherosclerosis due to calcified coronary lesion: Secondary | ICD-10-CM

## 2020-09-14 NOTE — Progress Notes (Signed)
[facility]  Chief Complaint  Patient presents with  . Consult    Shortness of breath when walking up steps, hill    Constitutional:  BP 124/72 (BP Location: Left Arm, Cuff Size: Normal)   Pulse 65   Temp (!) 97.3 F (36.3 C) (Other (Comment)) Comment (Src): wrist  Ht 5' 1"  (1.549 m)   Wt 171 lb (77.6 kg)   SpO2 98% Comment: Room air  BMI 32.31 kg/m   Past Medical History:  Anxiety, Back pain, Cirrhosis, Depression, DM type 2, Neuropathy, Diverticulitis, GERD, HLD, HTN, IBS, Meniere's disease, Hypothyroidism, CKD 3  Past Surgical History:  Her  has a past surgical history that includes Upper gastrointestinal endoscopy (2008); Ectopic pregnancy surgery (1996); bladder tact (2005); Partial hysterectomy (2005); Cholecystectomy (08/11); mumford (2009); Hernia repair (09/17/11); Colonoscopy (2208); Colonoscopy (In of September 2014); Ureteral stent placement; Abdominal hysterectomy; Ovarian cyst removal; Esophagogastroduodenoscopy (egd) with propofol (N/A, 03/20/2017); Colonoscopy with propofol (N/A, 03/20/2017); biopsy (03/20/2017); polypectomy (03/20/2017); Tendon repair (Left, 02/10/2018); and Ligament repair (Left, 02/10/2018).  Brief Summary:  [name] is a 59 y.o. female with dyspnea.      Subjective:   She has noticed more trouble with her breathing for about 9 months.  She doesn't recall anything that triggered her breathing issues.  She has trouble going up hills or doing house work.  She gets winded and feels her heart racing.  She will sit and rest, and then recover after few minutes.  She does not have history of asthma.  Not having cough, wheeze, or sputum.  She never smoked, but had second hand tobacco exposure.  She had pneumonia a couple years ago.  No history of TB or thromboembolic disease.  She also has trouble with her breathing while asleep.  She snores and will wake up feeling choked.  She gets sleepy and fatigued during  the day.  Labs from 06/05/20: Hb 14.5  Labs from 09/04/20: creatinine 0.89, Na 141, K 4.2, CO2 25, Ca 9.1, TSH 2.51.  Physical Exam:   Appearance - well kempt   ENMT - no sinus tenderness, no oral exudate, no LAN, Mallampati 4 airway, no stridor  Respiratory - equal breath sounds bilaterally, no wheezing or rales  CV - s1s2 regular rate and rhythm, no murmurs  Ext - no clubbing, no edema  Skin - no rashes  Psych - normal mood and affect   Pulmonary testing:    Chest Imaging:   Cardiac CT 06/16/20 >> atherosclerosis  Sleep Tests:    Cardiac Tests:   Echo 04/23/20 >> EF 55 to 93%, diastolic dysfunction  Social History:  She  reports that she has never smoked. She has never used smokeless tobacco. She reports that she does not drink alcohol and does not use drugs.  Family History:  Her family history includes Esophageal cancer in her brother; Healthy in her mother, son, and son; Heart disease in her father.    Discussion:  She has dyspnea with exertion.  Recent cardiac assessment was unrevealing for a cause.  She has second hand tobacco exposure.  It is possible she could have obstructive lung disease.  I suspect a significant portion of her breathing issues are related to obesity and deconditioning.    She also reports snoring, sleep disruption, apnea, and daytime sleepiness.  She has history of hypertension, coronary artery disease, and diabetes.  I am concerned she could have obstructive sleep apnea.  Assessment/Plan:   Dyspnea on  exertion. - will arrange for pulmonary function test - if PFT is unrevealing, then would need to do cardiopulmonary exercise test  Snoring. - will arrange for home sleep study to assess whether she could have obstructive sleep apnea  Coronary artery disease. - followed by Dr. Rex Kras with [facility] - medical management  NASH with cirrhosis. - followed by Dr. Laural Golden with Gastroenterology  Time Spent Involved in  Patient Care on Day of Examination:  32 minutes  Follow up:  Patient Instructions  Will arrange for pulmonary function test and home sleep study  Follow up in 4 weeks   Medication List:   Allergies as of 09/14/2020      Reactions   Flagyl [metronidazole Hcl] Itching, Rash   Nsaids Nausea Only      Medication List       Accurate as of September 14, 2020 10:39 AM. If you have any questions, ask your nurse or doctor.        acetaZOLAMIDE 250 MG tablet Commonly known as: DIAMOX Take 250 mg by mouth daily.   acyclovir 400 MG tablet Commonly known as: ZOVIRAX Take 400 mg by mouth 3 (three) times daily as needed (for fever blisters).   aspirin EC 81 MG tablet Take 1 tablet (81 mg total) by mouth daily. Swallow whole.   atorvastatin 20 MG tablet Commonly known as: LIPITOR TAKE ONE TABLET BY MOUTH DAILY What changed: when to take this   baclofen 10 MG tablet Commonly known as: LIORESAL   cyanocobalamin 1000 MCG/ML injection Commonly known as: (VITAMIN B-12) Inject 1,000 mcg into the muscle every 30 (thirty) days.   diazepam 2 MG tablet Commonly known as: VALIUM Take 5 mg by mouth every 12 (twelve) hours as needed for anxiety.   dicyclomine 20 MG tablet Commonly known as: BENTYL TAKE ONE TABLET BY MOUTH THREE TIMES DAILY   DULoxetine 60 MG capsule Commonly known as: CYMBALTA Take 60 mg by mouth daily.   estradiol 0.5 MG tablet Commonly known as: ESTRACE Take 0.5 mg by mouth every evening.   fluconazole 150 MG tablet Commonly known as: DIFLUCAN Take 150 mg by mouth as needed.   fluticasone 50 MCG/ACT nasal spray Commonly known as: FLONASE Place 2 sprays into both nostrils every morning.   FreeStyle Libre 14 Day Sensor Misc Inject 1 each into the skin every 14 (fourteen) days. Use as directed.   glucose blood test strip Commonly known as: FREESTYLE LITE Use as instructed   hydrochlorothiazide 25 MG tablet Commonly known as: HYDRODIURIL Take 25 mg by  mouth daily.   HYDROcodone-acetaminophen 5-325 MG tablet Commonly known as: NORCO/VICODIN Take 1 tablet by mouth every 6 (six) hours as needed for severe pain.   insulin aspart 100 UNIT/ML injection Commonly known as: NovoLOG Inject 15-22 Units into the skin 3 (three) times daily before meals.   Insulin Syringe-Needle U-100 31G X 5/16" 1 ML Misc Commonly known as: BD Insulin Syringe U/F Use as directed to inject insulin four times daily   levothyroxine 100 MCG tablet Commonly known as: SYNTHROID TAKE ONE TABLET BY MOUTH DAILY BEFORE BREAKFAST   lisinopril 20 MG tablet Commonly known as: ZESTRIL Take 20 mg by mouth daily.   metFORMIN 500 MG tablet Commonly known as: GLUCOPHAGE TAKE ONE TABLET BY MOUTH TWICE DAILY. TAKE WITH A MEAL.   metoprolol tartrate 50 MG tablet Commonly known as: LOPRESSOR TAKE ONE TABLET BY MOUTH TWICE DAILY   ondansetron 4 MG tablet Commonly known as: ZOFRAN Take 1  tablet (4 mg total) by mouth every 8 (eight) hours as needed for nausea or vomiting.   pantoprazole 40 MG tablet Commonly known as: PROTONIX 40 mg daily before breakfast.   rifaximin 550 MG Tabs tablet Commonly known as: XIFAXAN Take 1 tablet (550 mg total) by mouth 3 (three) times daily.   Tresiba 100 UNIT/ML Soln Generic drug: Insulin Degludec Inject 70 Units into the skin at bedtime.   Turmeric 500 MG Caps Take 500 mg by mouth daily.   Vitamin D3 50 MCG (2000 UT) capsule Take 4,000 Units by mouth daily.       Signature:  Chesley Mires, MD [place] Pager - [phone] 09/14/2020, 10:39 AM

## 2020-09-14 NOTE — Patient Instructions (Signed)
Will arrange for pulmonary function test and home sleep study  Follow up in 4 weeks

## 2020-09-17 ENCOUNTER — Other Ambulatory Visit: Payer: Self-pay

## 2020-09-17 ENCOUNTER — Ambulatory Visit (INDEPENDENT_AMBULATORY_CARE_PROVIDER_SITE_OTHER): Payer: Medicare Other | Admitting: Nurse Practitioner

## 2020-09-17 ENCOUNTER — Encounter: Payer: Self-pay | Admitting: Nurse Practitioner

## 2020-09-17 VITALS — BP 129/69 | HR 71 | Ht 61.0 in | Wt 185.0 lb

## 2020-09-17 DIAGNOSIS — E559 Vitamin D deficiency, unspecified: Secondary | ICD-10-CM | POA: Diagnosis not present

## 2020-09-17 DIAGNOSIS — E039 Hypothyroidism, unspecified: Secondary | ICD-10-CM | POA: Diagnosis not present

## 2020-09-17 DIAGNOSIS — I10 Essential (primary) hypertension: Secondary | ICD-10-CM

## 2020-09-17 DIAGNOSIS — Z794 Long term (current) use of insulin: Secondary | ICD-10-CM | POA: Diagnosis not present

## 2020-09-17 DIAGNOSIS — E782 Mixed hyperlipidemia: Secondary | ICD-10-CM

## 2020-09-17 DIAGNOSIS — IMO0002 Reserved for concepts with insufficient information to code with codable children: Secondary | ICD-10-CM

## 2020-09-17 DIAGNOSIS — E118 Type 2 diabetes mellitus with unspecified complications: Secondary | ICD-10-CM | POA: Diagnosis not present

## 2020-09-17 DIAGNOSIS — E1165 Type 2 diabetes mellitus with hyperglycemia: Secondary | ICD-10-CM | POA: Diagnosis not present

## 2020-09-17 LAB — POCT GLYCOSYLATED HEMOGLOBIN (HGB A1C): HbA1c, POC (controlled diabetic range): 7.7 % — AB (ref 0.0–7.0)

## 2020-09-17 MED ORDER — INSULIN ASPART 100 UNIT/ML ~~LOC~~ SOLN: 10.0000 [IU] | Freq: Three times a day (TID) | SUBCUTANEOUS | 3 refills | Status: AC

## 2020-09-17 MED ORDER — TRESIBA 100 UNIT/ML ~~LOC~~ SOLN: 50.0000 [IU] | Freq: Every day | SUBCUTANEOUS | 1 refills | Status: DC

## 2020-09-17 NOTE — Progress Notes (Signed)
09/17/2020     Endocrinology follow-up note    Subjective:    Patient ID: [name], female    DOB: 11-Jul-1961.  She is being seen in follow-up for  management of diabetes,  hyperlipidemia, hypothyroidism.    PMD: Glenda Chroman, MD  Past Medical History:  Diagnosis Date   Anxiety disorder    Chronic low back pain    Cirrhosis of liver ([place])    Colitis    Depression    Diabetes mellitus    Type II   Diabetic neuropathy (H[facility])    Diverticulitis    Fatty liver    GERD (gastroesophageal reflux disease)    High cholesterol    Hypertension    Irritable bowel syndrome    Meniere's disease    Neuropathy    Pain    Past Surgical History:  Procedure Laterality Date   ABDOMINAL HYSTERECTOMY     BIOPSY  03/20/2017   Procedure: BIOPSY;  Surgeon: [name], MD;  [facility];  Service: Endoscopy;;  colon gastric   bladder tact  2005   CHOLECYSTECTOMY  08/11   COLONOSCOPY  2208   COLONOSCOPY  In of September 2014   @ [facility]/Dr,.[facility]   COLONOSCOPY WITH PROPOFOL N/A 03/20/2017   Procedure: COLONOSCOPY WITH PROPOFOL;  Surgeon: [name], MD;  [facility];  Service: Endoscopy;  Laterality: N/A;   ECTOPIC PREGNANCY SURGERY  1996   ESOPHAGOGASTRODUODENOSCOPY (EGD) WITH PROPOFOL N/A 03/20/2017   Procedure: ESOPHAGOGASTRODUODENOSCOPY (EGD) WITH PROPOFOL;  Surgeon: [name], MD;  [facility];  Service: Endoscopy;  Laterality: N/A;  12:45   HERNIA REPAIR  09/17/11   LIGAMENT REPAIR Left 02/10/2018   Procedure: Left  ANKLE ATFL Repair and Ligament Augmentation, Injection of Tendon Splint Application;  Surgeon: [name], DPM;  Location: [facility];  Service: Podiatry;  Laterality: Left;   [place]  2009   Preformed on the right shoulder   OVARIAN CYST REMOVAL     right side   PARTIAL HYSTERECTOMY  2005   POLYPECTOMY  03/20/2017   Procedure: POLYPECTOMY;  Surgeon: [name], MD;  [facility];  Service: Endoscopy;;  colon   TENDON REPAIR Left 02/10/2018   Procedure: PERONEAL TENDON REPAIR and Synovectomy Peroneal Tendon;  Surgeon: [name], DPM;  Location: [facility];  Service: Podiatry;  Laterality: Left;   UPPER GASTROINTESTINAL ENDOSCOPY  2008   URETERAL STENT PLACEMENT     Social History   Socioeconomic History   Marital status: Divorced    Spouse name: Not on file   Number of children: Not on file   Years of education: Not on file   Highest education level: Not on file  Occupational History   Not on file  Social Needs   Financial resource strain: Not on file   Food insecurity:    Worry: Not on file    Inability: Not on file   Transportation needs:    Medical: Not on file    Non-medical: Not on file  Tobacco Use   Smoking status: Never Smoker   Smokeless tobacco: Never Used  Substance and Sexual Activity   Alcohol use: No   Drug use: No   Sexual activity: Yes    Birth control/protection: Surgical  Lifestyle   Physical activity:    Days per week: Not on file    Minutes per session: Not on file   Stress: Not on file  Relationships  Social connections:    Talks on phone: Not on file    Gets together: Not on file    Attends religious service: Not on file    Active member of club or organization: Not on file    Attends meetings of clubs or organizations: Not on file    Relationship status: Not on file  Other Topics Concern   Not on file  Social History Narrative   Not on file   Current Outpatient Medications on File Prior to Visit  Medication Sig Dispense Refill   acetaZOLAMIDE (DIAMOX) 250 MG tablet Take 250 mg by mouth daily.      acyclovir (ZOVIRAX) 400 MG tablet Take 400 mg by mouth 3 (three) times daily as needed (for fever blisters).      aspirin EC 81 MG tablet Take 1 tablet (81 mg total) by mouth daily. Swallow whole. 90 tablet 3   atorvastatin (LIPITOR) 20 MG tablet TAKE ONE TABLET BY MOUTH DAILY (Patient  taking differently: Take 20 mg by mouth at bedtime.) 90 tablet 0   baclofen (LIORESAL) 10 MG tablet      Cholecalciferol (VITAMIN D3) 2000 units capsule Take 4,000 Units by mouth daily.      Continuous Blood Gluc Sensor (FREESTYLE LIBRE 14 DAY SENSOR) MISC Inject 1 each into the skin every 14 (fourteen) days. Use as directed. 2 each 2   cyanocobalamin (,VITAMIN B-12,) 1000 MCG/ML injection Inject 1,000 mcg into the muscle every 30 (thirty) days.  5   diazepam (VALIUM) 2 MG tablet Take 5 mg by mouth every 12 (twelve) hours as needed for anxiety.      dicyclomine (BENTYL) 20 MG tablet TAKE ONE TABLET BY MOUTH THREE TIMES DAILY 90 tablet 5   DULoxetine (CYMBALTA) 60 MG capsule Take 60 mg by mouth daily.     estradiol (ESTRACE) 0.5 MG tablet Take 0.5 mg by mouth every evening.      fluconazole (DIFLUCAN) 150 MG tablet Take 150 mg by mouth as needed.      fluticasone (FLONASE) 50 MCG/ACT nasal spray Place 2 sprays into both nostrils every morning.     glucose blood (FREESTYLE LITE) test strip Use as instructed 100 each 2   HYDROcodone-acetaminophen (NORCO/VICODIN) 5-325 MG tablet Take 1 tablet by mouth every 6 (six) hours as needed for severe pain. 12 tablet 0   insulin aspart (NOVOLOG) 100 UNIT/ML injection Inject 15-22 Units into the skin 3 (three) times daily before meals. 20 mL 1   Insulin Degludec (TRESIBA) 100 UNIT/ML SOLN Inject 70 Units into the skin at bedtime. 20 mL 1   Insulin Syringe-Needle U-100 (BD INSULIN SYRINGE U/F) 31G X 5/16" 1 ML MISC Use as directed to inject insulin four times daily 100 each 3   levothyroxine (SYNTHROID) 100 MCG tablet TAKE ONE TABLET BY MOUTH DAILY BEFORE BREAKFAST 90 tablet 0   lisinopril (ZESTRIL) 20 MG tablet Take 20 mg by mouth daily.      metFORMIN (GLUCOPHAGE) 500 MG tablet TAKE ONE TABLET BY MOUTH TWICE DAILY. TAKE WITH A MEAL. 180 tablet 0   metoprolol tartrate (LOPRESSOR) 50 MG tablet TAKE ONE TABLET BY MOUTH TWICE DAILY 60 tablet 0    ondansetron (ZOFRAN) 4 MG tablet Take 1 tablet (4 mg total) by mouth every 8 (eight) hours as needed for nausea or vomiting. 12 tablet 0   pantoprazole (PROTONIX) 40 MG tablet 40 mg daily before breakfast.   4   rifaximin (XIFAXAN) 550 MG TABS tablet Take 1 tablet (550  mg total) by mouth 3 (three) times daily. 42 tablet 0   Turmeric 500 MG CAPS Take 500 mg by mouth daily.      No current facility-administered medications on file prior to visit.     No facility-administered encounter medications on file as of 12/27/2018.    ALLERGIES: Allergies  Allergen Reactions   Flagyl [Metronidazole Hcl] Itching and Rash   Nsaids Nausea Only   VACCINATION STATUS: Immunization History  Administered Date(s) Administered   Influenza Inj Mdck Quad Pf 08/07/2017, 08/11/2018   Diabetes She presents for her follow-up diabetic visit. She has type 2 diabetes mellitus. Onset time: She was diagnosed at the approximate age of 3. Her disease course has been stable. Hypoglycemia symptoms include nervousness/anxiousness, sweats and tremors. (Says she does not have any symptoms when her blood sugar drops dangerously low) Associated symptoms include polyuria. Pertinent negatives for diabetes include no chest pain, no fatigue, no polydipsia and no visual change. There are no hypoglycemic complications. Symptoms are stable. Diabetic complications include peripheral neuropathy. Risk factors for coronary artery disease include hypertension, diabetes mellitus, dyslipidemia, obesity and sedentary lifestyle. Current diabetic treatment includes intensive insulin program and oral agent (monotherapy). She is compliant with treatment most of the time. Her weight is fluctuating minimally. She is following a generally unhealthy diet. When asked about meal planning, she reported none. She has not had a previous visit with a dietitian. She rarely participates in exercise. Her home blood glucose trend is decreasing steadily. Her  breakfast blood glucose range is generally 90-110 mg/dl. Her lunch blood glucose range is generally 90-110 mg/dl. Her dinner blood glucose range is generally 70-90 mg/dl. Her bedtime blood glucose range is generally 70-90 mg/dl. Her overall blood glucose range is 90-110 mg/dl. (She presents today with her CGM and logs showing near target fasting glycemic profile.  She has frequent postprandial hypoglycemia still.  Her POCT A1c today is 7.7%, same as previous visit.  Analysis of her CGM shows TIR 73%, TAR 20%, TBR 7%.) An ACE inhibitor/angiotensin II receptor blocker is being taken. She sees a podiatrist.Eye exam is current.  Thyroid Problem Presents for follow-up visit. Symptoms include anxiety and tremors. Patient reports no cold intolerance, constipation, depressed mood, diarrhea, fatigue, heat intolerance, palpitations or visual change. The symptoms have been stable. Her past medical history is significant for diabetes and hyperlipidemia.  Hypertension This is a chronic problem. The current episode started more than 1 year ago. The problem has been gradually improving since onset. The problem is controlled. Associated symptoms include sweats. Pertinent negatives include no chest pain, palpitations or peripheral edema. Agents associated with hypertension include thyroid hormones. Risk factors for coronary artery disease include diabetes mellitus, dyslipidemia, sedentary lifestyle and obesity. Past treatments include ACE inhibitors, beta blockers and diuretics. The current treatment provides significant improvement. There are no compliance problems.  Hypertensive end-organ damage includes kidney disease. Identifiable causes of hypertension include chronic renal disease and a thyroid problem.  Hyperlipidemia This is a chronic problem. The current episode started more than 1 year ago. The problem is controlled. Recent lipid tests were reviewed and are normal. Exacerbating diseases include chronic renal disease,  diabetes, hypothyroidism and liver disease. Factors aggravating her hyperlipidemia include beta blockers, thiazides and fatty foods. Pertinent negatives include no chest pain. Current antihyperlipidemic treatment includes statins. The current treatment provides moderate improvement of lipids. There are no compliance problems.  Risk factors for coronary artery disease include diabetes mellitus, dyslipidemia, hypertension and obesity.    Review of systems  Constitutional: + Minimally fluctuating body weight,  current Body mass index is 34.96 kg/m. , no fatigue, no subjective hyperthermia, no subjective hypothermia Eyes: no blurry vision, no xerophthalmia ENT: no sore throat, no nodules palpated in throat, no dysphagia/odynophagia, no hoarseness Cardiovascular: no chest pain, no shortness of breath, no palpitations, no leg swelling Respiratory: no cough, no shortness of breath Gastrointestinal: no nausea/vomiting/diarrhea Musculoskeletal: no muscle/joint aches Skin: no rashes, no hyperemia Neurological: no tremors, no numbness, no tingling, no dizziness Psychiatric: + depression, no anxiety  Objective:    BP 129/69    Pulse 71    Ht 5' 1"  (1.549 m)    Wt 185 lb (83.9 kg)    BMI 34.96 kg/m   Wt Readings from Last 3 Encounters:  09/17/20 185 lb (83.9 kg)  09/14/20 171 lb (77.6 kg)  06/20/20 173 lb 9.6 oz (78.7 kg)    BP Readings from Last 3 Encounters:  09/17/20 129/69  09/14/20 124/72  08/17/20 (!) 155/86     Physical Exam- Limited  Constitutional:  Body mass index is 34.96 kg/m. , not in acute distress, normal state of mind Eyes:  EOMI, no exophthalmos Neck: Supple Thyroid: No gross goiter Cardiovascular: RRR, no murmers, rubs, or gallops, no edema Respiratory: Adequate breathing efforts, no crackles, rales, rhonchi, or wheezing Musculoskeletal: no gross deformities, strength intact in all four extremities, no gross restriction of joint movements Skin:  no rashes, no  hyperemia Neurological: no tremor with outstretched hands   POCT ABI Results 09/17/20   Right ABI:  1.26      Left ABI:  1.27  Right leg systolic / diastolic: 390/30 mmHg Left leg systolic / diastolic: 092/33 mmHg  Arm systolic / diastolic: 007/62 mmHG  Detailed report will be scanned into patient chart.   Recent Results (from the past 2160 hour(s))  B Nat Peptide     Status: None   Collection Time: 06/20/20 11:35 AM  Result Value Ref Range   BNP 13.3 0.0 - 100.0 pg/mL  Basic metabolic panel     Status: Abnormal   Collection Time: 06/20/20 11:35 AM  Result Value Ref Range   Glucose 91 65 - 99 mg/dL   BUN 29 (H) 6 - 24 mg/dL   Creatinine, Ser 1.40 (H) 0.57 - 1.00 mg/dL   GFR calc non Af Amer 41 (L) >59 mL/min/1.73   GFR calc Af Amer 48 (L) >59 mL/min/1.73    Comment: **[facility] currently reports eGFR in compliance with the current**   recommendations of the [employer]. [facility] will   update reporting as new guidelines are published from the NKF-ASN   Task force.    BUN/Creatinine Ratio 21 9 - 23   Sodium 137 134 - 144 mmol/L   Potassium 4.2 3.5 - 5.2 mmol/L   Chloride 101 96 - 106 mmol/L   CO2 21 20 - 29 mmol/L   Calcium 10.1 8.7 - 10.2 mg/dL  Basic metabolic panel     Status: Abnormal   Collection Time: 07/04/20 11:48 AM  Result Value Ref Range   Glucose 148 (H) 65 - 99 mg/dL   BUN 14 6 - 24 mg/dL   Creatinine, Ser 1.01 (H) 0.57 - 1.00 mg/dL   GFR calc non Af Amer 62 >59 mL/min/1.73   GFR calc Af Amer 71 >59 mL/min/1.73    Comment: **[facility] currently reports eGFR in compliance with the current**   recommendations of the [employer]. [facility] will   update reporting as new guidelines  are published from the NKF-ASN   Task force.    BUN/Creatinine Ratio 14 9 - 23   Sodium 142 134 - 144 mmol/L   Potassium 4.6 3.5 - 5.2 mmol/L   Chloride 106 96 - 106 mmol/L   CO2 23 20 - 29 mmol/L   Calcium 8.8 8.7 - 10.2 mg/dL  Comprehensive  metabolic panel     Status: Abnormal   Collection Time: 09/04/20  1:46 PM  Result Value Ref Range   Glucose 51 (L) 65 - 99 mg/dL   BUN 20 6 - 24 mg/dL   Creatinine, Ser 0.89 0.57 - 1.00 mg/dL   GFR calc non Af Amer 72 >59 mL/min/1.73   GFR calc Af Amer 83 >59 mL/min/1.73    Comment: **In accordance with recommendations from the NKF-ASN Task force,**   [facility] is in the process of updating its eGFR calculation to the   2021 CKD-EPI creatinine equation that estimates kidney function   without a race variable.    BUN/Creatinine Ratio 22 9 - 23   Sodium 141 134 - 144 mmol/L   Potassium 4.2 3.5 - 5.2 mmol/L   Chloride 108 (H) 96 - 106 mmol/L   CO2 25 20 - 29 mmol/L   Calcium 9.1 8.7 - 10.2 mg/dL   Total Protein 6.7 6.0 - 8.5 g/dL   Albumin 3.9 3.8 - 4.9 g/dL   Globulin, Total 2.8 1.5 - 4.5 g/dL   Albumin/Globulin Ratio 1.4 1.2 - 2.2   Bilirubin Total 0.3 0.0 - 1.2 mg/dL   Alkaline Phosphatase 108 44 - 121 IU/L    Comment:               **Please note reference interval change**   AST 36 0 - 40 IU/L   ALT 31 0 - 32 IU/L  TSH     Status: None   Collection Time: 09/04/20  1:46 PM  Result Value Ref Range   TSH 2.510 0.450 - 4.500 uIU/mL  T4, free     Status: None   Collection Time: 09/04/20  1:46 PM  Result Value Ref Range   Free T4 1.11 0.82 - 1.77 ng/dL    Lipid Panel     Component Value Date/Time   CHOL 165 01/24/2020 0754   TRIG 107 01/24/2020 0754   HDL 49 (L) 01/24/2020 0754   CHOLHDL 3.4 01/24/2020 0754   VLDL 29 06/30/2016 1251   LDLCALC 96 01/24/2020 0754    Assessment & Plan:   1) Uncontrolled type 2 diabetes mellitus with complication, with long-term current use of insulin  - Patient has currently uncontrolled symptomatic type 2 DM since 59 years of age.  She presents today with her CGM and logs showing near target fasting glycemic profile.  She has frequent postprandial hypoglycemia still.  Her POCT A1c today is 7.7%, same as previous visit.  Analysis of her  CGM shows TIR 73%, TAR 20%, TBR 7%.    Recent labs reviewed.   Her diabetes is complicated by obesity, insulin resistance, and patient remains at a high risk for more acute and chronic complications of diabetes which include CAD, CVA, CKD, retinopathy, and neuropathy. These are all discussed in detail with the patient.  - Nutritional counseling repeated at each appointment due to patients tendency to fall back in to old habits.  - The patient admits there is a room for improvement in their diet and drink choices. -  Suggestion is made for the patient to avoid simple carbohydrates from  their diet including Cakes, Sweet Desserts / Pastries, Ice Cream, Soda (diet and regular), Sweet Tea, Candies, Chips, Cookies, Sweet Pastries,  Store Bought Juices, Alcohol in Excess of  1-2 drinks a day, Artificial Sweeteners, Coffee Creamer, and "Sugar-free" Products. This will help patient to have stable blood glucose profile and potentially avoid unintended weight gain.   - I encouraged the patient to switch to  unprocessed or minimally processed complex starch and increased protein intake (animal or plant source), fruits, and vegetables.   - Patient is advised to stick to a routine mealtimes to eat 3 meals  a day and avoid unnecessary snacks ( to snack only to correct hypoglycemia).  - I have approached patient with the following individualized plan to manage diabetes and patient agrees:   -Due to continued postprandial hypoglycemic episodes, will decrease Tresiba to 50 units SQ nightly and decrease Novolog to 10-16 units TID with meals if glucose is above 90 and she is eating.  Specific instructions on how to titrate insulin dose based on glucose readings given to patient in writing. She is advised to continue Metformin 500 mg p.o. twice daily with meals.  -She is advised to continue using her CGM to monitor glucose 4 times daily, before meals and before bed, and to call the clinic if she has readings less than  70 or greater than 300 for 3 tests  In a row.  - She has history of intolerance for [place], likely would not tolerate other incretin therapy.  - Patient specific target  A1c;  LDL, HDL, Triglycerides, and  Waist Circumference were discussed in detail.  2) Lipids/HPL:  Her most recent lipid panel from 01/24/20 shows a controlled LDL of 96.  She is advised to continue Lipitor 20 mg po daily at bedtime.  Side effects and precautions discussed with her.  3) Hypothyroidism:  -Her previsit thyroid function tests are consistent with appropriate hormone replacement.  She is advised to continue Levothyroxine 100 mcg po daily before breakfast.   - We discussed about the correct intake of her thyroid hormone, on empty stomach at fasting, with water, separated by at least 30 minutes from breakfast and other medications,  and separated by more than 4 hours from calcium, iron, multivitamins, acid reflux medications (PPIs). -Patient is made aware of the fact that thyroid hormone replacement is needed for life, dose to be adjusted by periodic monitoring of thyroid function tests.  4) Hypertension- Her blood pressure is controlled to target.  She is advised to continue Lisinopril 20 mg po daily.   - I advised patient to maintain close follow up with Glenda Chroman, MD for primary care needs.  - Time spent on this patient care encounter:  35 min, of which > 50% was spent in  counseling and the rest reviewing her blood glucose logs , discussing her hypoglycemia and hyperglycemia episodes, reviewing her current and  previous labs / studies  ( including abstraction from other facilities) and medications  doses and developing a  long term treatment plan and documenting her care.   Please refer to Patient Instructions for Blood Glucose Monitoring and Insulin/Medications Dosing Guide"  in media tab for additional information. Please  also refer to " Patient Self Inventory" in the Media  tab for reviewed elements of  pertinent patient history.  [name] participated in the discussions, expressed understanding, and voiced agreement with the above plans.  All questions were answered to her satisfaction. she is encouraged to contact clinic should  she have any questions or concerns prior to her return visit.   Follow up plan: Return in about 4 months (around 01/16/2021) for Diabetes follow up- A1c and urine micro in office, Thyroid follow up, Previsit labs.  Rayetta Pigg, [facility] [address] [place], [place] [address] Phone: [phone] Fax: [phone]

## 2020-09-17 NOTE — Patient Instructions (Signed)

## 2020-09-18 DIAGNOSIS — Z23 Encounter for immunization: Secondary | ICD-10-CM | POA: Diagnosis not present

## 2020-09-19 ENCOUNTER — Ambulatory Visit: Payer: Medicare Other | Admitting: Cardiology

## 2020-09-19 ENCOUNTER — Telehealth: Payer: Self-pay | Admitting: Pulmonary Disease

## 2020-09-19 ENCOUNTER — Encounter: Payer: Self-pay | Admitting: Cardiology

## 2020-09-19 ENCOUNTER — Other Ambulatory Visit: Payer: Self-pay

## 2020-09-19 VITALS — BP 135/75 | HR 61 | Resp 16 | Ht 61.0 in | Wt 185.8 lb

## 2020-09-19 DIAGNOSIS — I2510 Atherosclerotic heart disease of native coronary artery without angina pectoris: Secondary | ICD-10-CM | POA: Diagnosis not present

## 2020-09-19 DIAGNOSIS — Z794 Long term (current) use of insulin: Secondary | ICD-10-CM

## 2020-09-19 DIAGNOSIS — E66812 Obesity, class 2: Secondary | ICD-10-CM

## 2020-09-19 DIAGNOSIS — Z8249 Family history of ischemic heart disease and other diseases of the circulatory system: Secondary | ICD-10-CM | POA: Diagnosis not present

## 2020-09-19 DIAGNOSIS — E785 Hyperlipidemia, unspecified: Secondary | ICD-10-CM

## 2020-09-19 DIAGNOSIS — Z6835 Body mass index (BMI) 35.0-35.9, adult: Secondary | ICD-10-CM

## 2020-09-19 DIAGNOSIS — I10 Essential (primary) hypertension: Secondary | ICD-10-CM

## 2020-09-19 DIAGNOSIS — I2584 Coronary atherosclerosis due to calcified coronary lesion: Secondary | ICD-10-CM | POA: Diagnosis not present

## 2020-09-19 DIAGNOSIS — R0609 Other forms of dyspnea: Secondary | ICD-10-CM | POA: Diagnosis not present

## 2020-09-19 DIAGNOSIS — E1165 Type 2 diabetes mellitus with hyperglycemia: Secondary | ICD-10-CM

## 2020-09-19 NOTE — Progress Notes (Signed)
Date:  09/19/2020   ID:  [name], DOB Apr 14, 1961, MRN [id number]  PCP:  Glenda Chroman, MD  Cardiologist:  Rex Kras, DO, [facility] (established care 05/22/2020)  Chief Complaint  Patient presents with  . Shortness of Breath    Re-evaluation  . Follow-up    3 months    HPI  [name] is a 59 y.o. female who presents to the office with a chief complaint of "reevaluation of dyspnea." Patient's past medical history and cardiovascular risk factors include: Insulin-dependent diabetes mellitus type 2, hypertension with chronic kidney disease stage III, dyslipidemia, coronary artery calcification, obesity due to excess calories, family history of premature coronary artery disease.  Patient is referred to the office at the request of her primary care provider for evaluation of dyspnea on exertion.  During initial consultation patient was describing symptoms of effort related dyspnea which were progressive and given her multiple cardiovascular risk factors with concerning for underlying CAD.  Shared decision was to proceed with coronary CTA.  She is noted to have coronary artery calcification and disease but as per CT FFR no significant stenosis identified.  The plan was to uptitrate guideline directed medical therapy and treat her risk factors aggressively.    She now presents for 72-monthfollow-up.  Patient states that she continues to be short of breath when she goes up a flight of stairs but the intensity, frequency, and duration has not worsened.  At the last office visit she was recommended to start aspirin 81 mg p.o. daily, we uptitrated Lopressor.  She tolerated the medication changes well without any side effects or intolerances.    Unfortunately, patient has gained approximately 9 to 10 pounds over 4 months since I started seeing her.  She is attributing this to increased duration of sleeping, diabetes, and decreased physical activity.  She recently had her A1c checked in December  2021 labs were independently reviewed and hemoglobin A1c is 7.7.  Her last lipid profile was checked in April 2021.  I also recommended to discuss a pulmonary evaluation with her PCP given her symptoms of shortness of breath.  Patient states that the consultation is still pending as that she is supposed to have a PFT and sleep study prior to the appointment.  Patient is encouraged to follow-up with.  Family history of premature CAD: Dad had MI at age of 477   FUNCTIONAL STATUS: Walks her dogs 20-30 minutes twice a day.    ALLERGIES: Allergies  Allergen Reactions  . Flagyl [Metronidazole Hcl] Itching and Rash  . Nsaids Nausea Only    MEDICATION LIST PRIOR TO VISIT: Current Meds  Medication Sig  . acetaZOLAMIDE (DIAMOX) 250 MG tablet Take 250 mg by mouth daily.   .Marland Kitchenacyclovir (ZOVIRAX) 400 MG tablet Take 400 mg by mouth 3 (three) times daily as needed (for fever blisters).   .Marland Kitchenaspirin EC 81 MG tablet Take 1 tablet (81 mg total) by mouth daily. Swallow whole.  .Marland Kitchenatorvastatin (LIPITOR) 20 MG tablet TAKE ONE TABLET BY MOUTH DAILY (Patient taking differently: Take 20 mg by mouth at bedtime.)  . baclofen (LIORESAL) 10 MG tablet   . Cholecalciferol (VITAMIN D3) 2000 units capsule Take 4,000 Units by mouth daily.   . Continuous Blood Gluc Sensor (FREESTYLE LIBRE 14 DAY SENSOR) MISC Inject 1 each into the skin every 14 (fourteen) days. Use as directed.  . cyanocobalamin (,VITAMIN B-12,) 1000 MCG/ML injection Inject 1,000 mcg into the muscle every 30 (thirty) days.  .Marland Kitchen  diazepam (VALIUM) 2 MG tablet Take 5 mg by mouth every 12 (twelve) hours as needed for anxiety.   . dicyclomine (BENTYL) 20 MG tablet TAKE ONE TABLET BY MOUTH THREE TIMES DAILY  . DULoxetine (CYMBALTA) 60 MG capsule Take 60 mg by mouth daily.  Marland Kitchen estradiol (ESTRACE) 0.5 MG tablet Take 0.5 mg by mouth every evening.   . fluconazole (DIFLUCAN) 150 MG tablet Take 150 mg by mouth as needed.   . fluticasone (FLONASE) 50 MCG/ACT nasal  spray Place 2 sprays into both nostrils every morning.  Marland Kitchen glucose blood (FREESTYLE LITE) test strip Use as instructed  . HYDROcodone-acetaminophen (NORCO/VICODIN) 5-325 MG tablet Take 1 tablet by mouth every 6 (six) hours as needed for severe pain.  Marland Kitchen insulin aspart (NOVOLOG) 100 UNIT/ML injection Inject 10-16 Units into the skin 3 (three) times daily with meals.  . Insulin Degludec (TRESIBA) 100 UNIT/ML SOLN Inject 50 Units into the skin at bedtime.  . Insulin Syringe-Needle U-100 (BD INSULIN SYRINGE U/F) 31G X 5/16" 1 ML MISC Use as directed to inject insulin four times daily  . levothyroxine (SYNTHROID) 100 MCG tablet TAKE ONE TABLET BY MOUTH DAILY BEFORE BREAKFAST  . lisinopril (ZESTRIL) 20 MG tablet Take 20 mg by mouth daily.   . metFORMIN (GLUCOPHAGE) 500 MG tablet TAKE ONE TABLET BY MOUTH TWICE DAILY. TAKE WITH A MEAL.  . metoprolol tartrate (LOPRESSOR) 50 MG tablet TAKE ONE TABLET BY MOUTH TWICE DAILY  . ondansetron (ZOFRAN) 4 MG tablet Take 1 tablet (4 mg total) by mouth every 8 (eight) hours as needed for nausea or vomiting.  . pantoprazole (PROTONIX) 40 MG tablet 40 mg daily before breakfast.   . rifaximin (XIFAXAN) 550 MG TABS tablet Take 1 tablet (550 mg total) by mouth 3 (three) times daily.  . Turmeric 500 MG CAPS Take 500 mg by mouth daily.      PAST MEDICAL HISTORY: Past Medical History:  Diagnosis Date  . Anxiety disorder   . Chronic low back pain   . Cirrhosis of liver ([place])   . Colitis   . Depression   . Diabetes mellitus    Type II  . Diabetic neuropathy ([place])   . Diverticulitis   . Fatty liver   . GERD (gastroesophageal reflux disease)   . High cholesterol   . Hypertension   . Irritable bowel syndrome   . Meniere's disease   . Neuropathy   . Pain     PAST SURGICAL HISTORY: Past Surgical History:  Procedure Laterality Date  . ABDOMINAL HYSTERECTOMY    . BIOPSY  03/20/2017   Procedure: BIOPSY;  Surgeon: [name], MD;  [facility];   Service: Endoscopy;;  colon gastric  . bladder tact  2005  . CHOLECYSTECTOMY  08/11  . COLONOSCOPY  2208  . COLONOSCOPY  In of September 2014   @ [facility]/Dr,.[name]  . COLONOSCOPY WITH PROPOFOL N/A 03/20/2017   Procedure: COLONOSCOPY WITH PROPOFOL;  Surgeon: [name], MD;  [facility];  Service: Endoscopy;  Laterality: N/A;  . ECTOPIC PREGNANCY SURGERY  1996  . ESOPHAGOGASTRODUODENOSCOPY (EGD) WITH PROPOFOL N/A 03/20/2017   Procedure: ESOPHAGOGASTRODUODENOSCOPY (EGD) WITH PROPOFOL;  Surgeon: [name], MD;  [facility];  Service: Endoscopy;  Laterality: N/A;  12:45  . HERNIA REPAIR  09/17/11  . LIGAMENT REPAIR Left 02/10/2018   Procedure: Left  ANKLE ATFL Repair and Ligament Augmentation, Injection of Tendon Splint Application;  Surgeon: [name], DPM;  Location: [facility]  OR;  Service: Podiatry;  Laterality: Left;  . [place]  2009   Preformed on the right shoulder  . OVARIAN CYST REMOVAL     right side  . PARTIAL HYSTERECTOMY  2005  . POLYPECTOMY  03/20/2017   Procedure: POLYPECTOMY;  Surgeon: [name], MD;  [facility];  Service: Endoscopy;;  colon  . TENDON REPAIR Left 02/10/2018   Procedure: PERONEAL TENDON REPAIR and Synovectomy Peroneal Tendon;  Surgeon: [name], DPM;  Location: [place];  Service: Podiatry;  Laterality: Left;  . UPPER GASTROINTESTINAL ENDOSCOPY  2008  . URETERAL STENT PLACEMENT      FAMILY HISTORY: The patient family history includes Esophageal cancer in her brother; Healthy in her mother, son, and son; Heart disease in her father.  SOCIAL HISTORY:  The patient  reports that she has never smoked. She has never used smokeless tobacco. She reports that she does not drink alcohol and does not use drugs.  REVIEW OF SYSTEMS: Review of Systems  Constitutional: Positive for malaise/fatigue and weight gain. Negative for chills and fever.  HENT: Negative for hoarse voice and nosebleeds.   Eyes: Negative for  discharge, double vision and pain.  Cardiovascular: Positive for dyspnea on exertion. Negative for claudication, leg swelling, near-syncope, orthopnea, palpitations, paroxysmal nocturnal dyspnea and syncope.  Respiratory: Negative for hemoptysis and shortness of breath.   Musculoskeletal: Negative for muscle cramps and myalgias.  Gastrointestinal: Negative for abdominal pain, constipation, diarrhea, hematemesis, hematochezia, melena, nausea and vomiting.  Neurological: Negative for dizziness and light-headedness.    PHYSICAL EXAM: Vitals with BMI 09/19/2020 09/17/2020 09/14/2020  Height _0  _1  _2   Weight 185 lbs 13 oz 185 lbs 171 lbs  BMI 35.12 50.27 74.12  Systolic 878 676 720  Diastolic 75 69 72  Pulse 61 71 65    CONSTITUTIONAL: Well-developed and well-nourished. No acute distress.  SKIN: Skin is warm and dry. No rash noted. No cyanosis. No pallor. No jaundice HEAD: Normocephalic and atraumatic.  EYES: No scleral icterus MOUTH/THROAT: Moist oral membranes.  NECK: No JVD present. No thyromegaly noted. No carotid bruits  LYMPHATIC: No visible cervical adenopathy.  CHEST Normal respiratory effort. No intercostal retractions  LUNGS: Clear to auscultation bilaterally.  No stridor. No wheezes. No rales.  CARDIOVASCULAR: Regular rate and rhythm, positive N4-B0, soft holosystolic murmur heard at the apex, no gallops or rubs ABDOMINAL: No apparent ascites.  EXTREMITIES: No peripheral edema  HEMATOLOGIC: No significant bruising NEUROLOGIC: Oriented to person, place, and time. Nonfocal. Normal muscle tone.  PSYCHIATRIC: Normal mood and affect. Normal behavior. Cooperative  CARDIAC DATABASE: EKG: 05/22/2020: Normal sinus rhythm, 85 bpm, poor R wave progression, without underlying ischemia or injury pattern.    Echocardiogram: 04/23/2020 performed at [facility] internal medicine: Per report patient's LVEF is 55 to 60%, Doppler evidence of left ventricular diastolic dysfunction, aortic  valve sclerosis without stenosis, trace MR, mild TR, RVSP 19 mmHg.  Stress Testing: NA  Heart Catheterization: None  Coronary CTA 06/2020: Coronary calcium score of 105. This was 78th percentile for age and sex matched control. Normal coronary origin with right dominance. CADRADS = 3. Calcified plaque noted at the level of the trifurcation giving rise to moderate stenosis at the level of the ostial LAD and ostial ramus. Severity of the ledsion may be overestimated due to blooming artifact. Therefore the study will be sent to CT FFR for functional assessment. Aortic atherosclerosis. CT FFR analysis showed no significant stenosis.  LABORATORY DATA: CBC Latest Ref Rng &  Units 06/05/2020 05/27/2019 02/10/2018  WBC 3.8 - 10.8 Thousand/uL 10.8 17.1(H) 13.9(H)  Hemoglobin 11.7 - 15.5 g/dL 13.2 14.5 13.4  Hematocrit 35.0 - 45.0 % 39.0 44.3 39.9  Platelets 140 - 400 Thousand/uL 205 266 189   Lipid Panel     Component Value Date/Time   CHOL 165 01/24/2020 0754   TRIG 107 01/24/2020 0754   HDL 49 (L) 01/24/2020 0754   CHOLHDL 3.4 01/24/2020 0754   VLDL 29 06/30/2016 1251   LDLCALC 96 01/24/2020 0754    No components found for: NTPROBNP No results for input(s): PROBNP in the last 8760 hours. Recent Labs    01/24/20 0754 09/04/20 1346  TSH 0.53 2.510    BMP Recent Labs    06/20/20 1135 07/04/20 1148 09/04/20 1346  NA 137 142 141  K 4.2 4.6 4.2  CL 101 106 108*  CO2 _0 GLUCOSE 91 148* 51*  BUN 29* 14 20  CREATININE 1.40* 1.01* 0.89  CALCIUM 10.1 8.8 9.1  GFRNONAA 41* 62 72  GFRAA 48* 71 83    HEMOGLOBIN A1C Lab Results  Component Value Date   HGBA1C 7.7 (A) 09/17/2020   MPG 206 07/28/2019   IMPRESSION:    ICD-10-CM   1. Dyspnea on exertion  R06.00   2. Coronary atherosclerosis due to calcified coronary lesion of native artery  I25.10 Lipid Panel With LDL/HDL Ratio   I25.84 LDL cholesterol, direct    CMP14+EGFR  3. Family history of premature CAD  Z82.49    4. Type 2 diabetes mellitus with hyperglycemia, with long-term current use of insulin (HCC)  E11.65    Z79.4   5. Long-term insulin use (HCC)  Z79.4   6. Benign hypertension  I10   7. Dyslipidemia  E78.5 Lipid Panel With LDL/HDL Ratio    LDL cholesterol, direct    CMP14+EGFR  8. Class 2 severe obesity due to excess calories with serious comorbidity and body mass index (BMI) of 35.0 to 35.9 in adult [facility])  E66.01    Z68.35      RECOMMENDATIONS: CANDI PROFIT is a 59 y.o. female whose past medical history and cardiac risk factors include: Insulin-dependent diabetes mellitus type 2, hypertension with chronic kidney disease stage III, dyslipidemia, coronary artery calcification, obesity due to excess calories, family history of premature coronary artery disease.  Dyspnea on exertion: Chronic and stable  Patient did undergo coronary CTA given her symptoms and multiple cardiovascular risk factors as outlined above.  She is noted to have coronary artery calcification and nonobstructive CAD for which pharmacological therapy and improving her cardiovascular risk factors is recommended.    Continue Lopressor to 50 mg p.o. twice daily   Continue ACE inhibitors.    Continue aspirin 81 mg p.o. daily  Continue statin therapy   Since last office visit we also checked your BNP which is within normal limits.  Her symptoms are less likely to be cardiac related.   Encouraged her to follow-up with her sleep study and PFTs and see pulmonology to see the result of noncardiac causes of her shortness of breath.  Patient has gained approximately 9 to 10 pounds in the last 4 months.  I encouraged her to increase her physical activity to moderate intensity exercise for 30 minutes a day 5 days a week to help facilitate weight loss.   Coronary artery calcification:   Continue aspirin and statin therapy.    Her statin therapy was increased to atorvastatin 20 mg p.o. nightly.  There has not been a repeat  lipid profile to reevaluate her response to medical therapy.    Recommended rechecking fasting lipid profile.  However, patient states that she would like to hold off in the next year due to monetary reasons and insurance coverage.    We will check a fasting lipid profile prior to next office visit.    Continue to improve her cardiovascular risk factors.  Educated on importance of glycemic control, most recent hemoglobin A1c 7.7.  Recommend a goal LDL of less than 70 mg/dL.  Recommend keeping systolic blood pressure less than 130 mmHg if able to tolerate.  Family history of premature coronary disease: Continue statin therapy.  See discussion above.  Benign essential hypertension with chronic kidney disease stage III . Medication reconciled.  . Currently managed by primary care provider. . Low salt diet recommended. A diet that is rich in fruits, vegetables, legumes, and low-fat dairy products and low in snacks, sweets, and meats (such as the Dietary Approaches to Stop Hypertension [DASH] diet).   Dyslipidemia: Continue statin therapy.  Currently managed by primary care provider.  FINAL MEDICATION LIST END OF ENCOUNTER: No orders of the defined types were placed in this encounter.   There are no discontinued medications.   Current Outpatient Medications:  .  acetaZOLAMIDE (DIAMOX) 250 MG tablet, Take 250 mg by mouth daily. , Disp: , Rfl:  .  acyclovir (ZOVIRAX) 400 MG tablet, Take 400 mg by mouth 3 (three) times daily as needed (for fever blisters). , Disp: , Rfl:  .  aspirin EC 81 MG tablet, Take 1 tablet (81 mg total) by mouth daily. Swallow whole., Disp: 90 tablet, Rfl: 3 .  atorvastatin (LIPITOR) 20 MG tablet, TAKE ONE TABLET BY MOUTH DAILY (Patient taking differently: Take 20 mg by mouth at bedtime.), Disp: 90 tablet, Rfl: 0 .  baclofen (LIORESAL) 10 MG tablet, , Disp: , Rfl:  .  Cholecalciferol (VITAMIN D3) 2000 units capsule, Take 4,000 Units by mouth daily. , Disp: , Rfl:  .   Continuous Blood Gluc Sensor (FREESTYLE LIBRE 14 DAY SENSOR) MISC, Inject 1 each into the skin every 14 (fourteen) days. Use as directed., Disp: 2 each, Rfl: 2 .  cyanocobalamin (,VITAMIN B-12,) 1000 MCG/ML injection, Inject 1,000 mcg into the muscle every 30 (thirty) days., Disp: , Rfl: 5 .  diazepam (VALIUM) 2 MG tablet, Take 5 mg by mouth every 12 (twelve) hours as needed for anxiety. , Disp: , Rfl:  .  dicyclomine (BENTYL) 20 MG tablet, TAKE ONE TABLET BY MOUTH THREE TIMES DAILY, Disp: 90 tablet, Rfl: 5 .  DULoxetine (CYMBALTA) 60 MG capsule, Take 60 mg by mouth daily., Disp: , Rfl:  .  estradiol (ESTRACE) 0.5 MG tablet, Take 0.5 mg by mouth every evening. , Disp: , Rfl:  .  fluconazole (DIFLUCAN) 150 MG tablet, Take 150 mg by mouth as needed. , Disp: , Rfl:  .  fluticasone (FLONASE) 50 MCG/ACT nasal spray, Place 2 sprays into both nostrils every morning., Disp: , Rfl:  .  glucose blood (FREESTYLE LITE) test strip, Use as instructed, Disp: 100 each, Rfl: 2 .  HYDROcodone-acetaminophen (NORCO/VICODIN) 5-325 MG tablet, Take 1 tablet by mouth every 6 (six) hours as needed for severe pain., Disp: 12 tablet, Rfl: 0 .  insulin aspart (NOVOLOG) 100 UNIT/ML injection, Inject 10-16 Units into the skin 3 (three) times daily with meals., Disp: 40 mL, Rfl: 3 .  Insulin Degludec (TRESIBA) 100 UNIT/ML SOLN, Inject 50 Units into the  skin at bedtime., Disp: 20 mL, Rfl: 1 .  Insulin Syringe-Needle U-100 (BD INSULIN SYRINGE U/F) 31G X 5/16" 1 ML MISC, Use as directed to inject insulin four times daily, Disp: 100 each, Rfl: 3 .  levothyroxine (SYNTHROID) 100 MCG tablet, TAKE ONE TABLET BY MOUTH DAILY BEFORE BREAKFAST, Disp: 90 tablet, Rfl: 0 .  lisinopril (ZESTRIL) 20 MG tablet, Take 20 mg by mouth daily. , Disp: , Rfl:  .  metFORMIN (GLUCOPHAGE) 500 MG tablet, TAKE ONE TABLET BY MOUTH TWICE DAILY. TAKE WITH A MEAL., Disp: 180 tablet, Rfl: 0 .  metoprolol tartrate (LOPRESSOR) 50 MG tablet, TAKE ONE TABLET BY MOUTH  TWICE DAILY, Disp: 60 tablet, Rfl: 0 .  ondansetron (ZOFRAN) 4 MG tablet, Take 1 tablet (4 mg total) by mouth every 8 (eight) hours as needed for nausea or vomiting., Disp: 12 tablet, Rfl: 0 .  pantoprazole (PROTONIX) 40 MG tablet, 40 mg daily before breakfast. , Disp: , Rfl: 4 .  rifaximin (XIFAXAN) 550 MG TABS tablet, Take 1 tablet (550 mg total) by mouth 3 (three) times daily., Disp: 42 tablet, Rfl: 0 .  Turmeric 500 MG CAPS, Take 500 mg by mouth daily. , Disp: , Rfl:  .  PARoxetine (PAXIL) 40 MG tablet, Take 1 tablet by mouth daily., Disp: , Rfl:   Orders Placed This Encounter  Procedures  . Lipid Panel With LDL/HDL Ratio  . LDL cholesterol, direct  . CMP14+EGFR    There are no Patient Instructions on file for this visit.   --Continue cardiac medications as reconciled in final medication list. --Return in about 3 months (around 12/18/2020) for Reevaluation of, Dyspnea, Coronary artery calcification. Or sooner if needed. --Continue follow-up with your primary care physician regarding the management of your other chronic comorbid conditions.  Patient's questions and concerns were addressed to her satisfaction. She voices understanding of the instructions provided during this encounter.   This note was created using a voice recognition software as a result there may be grammatical errors inadvertently enclosed that do not reflect the nature of this encounter. Every attempt is made to correct such errors.  Rex Kras, [place], [facility]  Pager: [phone] Office: [phone]

## 2020-09-20 NOTE — Telephone Encounter (Signed)
I called pt & left her a vm letting her know Resp Therapy will be calling her for pft & gave her their phone # [phone] and also told her Kenney Houseman in [place] office will call her for hst & gave her Tonya's #.  I also let Tonya know pt has called in about ready to schedule study.  She was going to speak to pt now but made her aware I was having to leave vm.  Left my phone # for pt to call me if she has any questions.

## 2020-09-24 DIAGNOSIS — Z299 Encounter for prophylactic measures, unspecified: Secondary | ICD-10-CM | POA: Diagnosis not present

## 2020-09-24 DIAGNOSIS — J42 Unspecified chronic bronchitis: Secondary | ICD-10-CM | POA: Diagnosis not present

## 2020-09-24 DIAGNOSIS — M7120 Synovial cyst of popliteal space [Baker], unspecified knee: Secondary | ICD-10-CM | POA: Diagnosis not present

## 2020-09-24 DIAGNOSIS — F339 Major depressive disorder, recurrent, unspecified: Secondary | ICD-10-CM | POA: Diagnosis not present

## 2020-09-24 DIAGNOSIS — M25562 Pain in left knee: Secondary | ICD-10-CM | POA: Diagnosis not present

## 2020-09-24 DIAGNOSIS — I10 Essential (primary) hypertension: Secondary | ICD-10-CM | POA: Diagnosis not present

## 2020-09-26 ENCOUNTER — Telehealth (HOSPITAL_COMMUNITY): Payer: Self-pay | Admitting: *Deleted

## 2020-09-26 NOTE — Telephone Encounter (Signed)
Therapy Ref faxed to office to sch new pt appt for patient from [facility]. Was not able to reach patient and staff [facility] with office number.

## 2020-10-02 ENCOUNTER — Other Ambulatory Visit: Payer: Self-pay

## 2020-10-02 DIAGNOSIS — I2510 Atherosclerotic heart disease of native coronary artery without angina pectoris: Secondary | ICD-10-CM

## 2020-10-02 MED ORDER — ASPIRIN EC 81 MG PO TBEC
81.0000 mg | DELAYED_RELEASE_TABLET | Freq: Every day | ORAL | 3 refills | Status: AC
Start: 2020-10-02 — End: ?

## 2020-10-04 DIAGNOSIS — M159 Polyosteoarthritis, unspecified: Secondary | ICD-10-CM | POA: Diagnosis not present

## 2020-10-04 DIAGNOSIS — E119 Type 2 diabetes mellitus without complications: Secondary | ICD-10-CM | POA: Diagnosis not present

## 2020-10-04 DIAGNOSIS — I10 Essential (primary) hypertension: Secondary | ICD-10-CM | POA: Diagnosis not present

## 2020-10-12 ENCOUNTER — Ambulatory Visit: Payer: Medicare Other | Admitting: Pulmonary Disease

## 2020-10-15 ENCOUNTER — Other Ambulatory Visit: Payer: Self-pay

## 2020-10-15 ENCOUNTER — Ambulatory Visit (HOSPITAL_COMMUNITY): Payer: Medicare Other | Admitting: Clinical

## 2020-10-15 ENCOUNTER — Telehealth (HOSPITAL_COMMUNITY): Payer: Self-pay | Admitting: Clinical

## 2020-10-15 NOTE — Telephone Encounter (Signed)
Pt did not respond to video link, call, or VM

## 2020-10-17 ENCOUNTER — Other Ambulatory Visit: Payer: Self-pay | Admitting: "Endocrinology

## 2020-10-17 DIAGNOSIS — K4090 Unilateral inguinal hernia, without obstruction or gangrene, not specified as recurrent: Secondary | ICD-10-CM | POA: Diagnosis not present

## 2020-10-17 DIAGNOSIS — M549 Dorsalgia, unspecified: Secondary | ICD-10-CM | POA: Diagnosis not present

## 2020-10-17 DIAGNOSIS — Z5321 Procedure and treatment not carried out due to patient leaving prior to being seen by health care provider: Secondary | ICD-10-CM | POA: Diagnosis not present

## 2020-10-17 DIAGNOSIS — R109 Unspecified abdominal pain: Secondary | ICD-10-CM | POA: Diagnosis not present

## 2020-10-17 DIAGNOSIS — K439 Ventral hernia without obstruction or gangrene: Secondary | ICD-10-CM | POA: Diagnosis not present

## 2020-10-17 DIAGNOSIS — K429 Umbilical hernia without obstruction or gangrene: Secondary | ICD-10-CM | POA: Diagnosis not present

## 2020-10-19 ENCOUNTER — Other Ambulatory Visit: Payer: Self-pay

## 2020-10-19 DIAGNOSIS — Z299 Encounter for prophylactic measures, unspecified: Secondary | ICD-10-CM | POA: Diagnosis not present

## 2020-10-19 DIAGNOSIS — Z789 Other specified health status: Secondary | ICD-10-CM | POA: Diagnosis not present

## 2020-10-19 DIAGNOSIS — R0609 Other forms of dyspnea: Secondary | ICD-10-CM

## 2020-10-19 DIAGNOSIS — I2584 Coronary atherosclerosis due to calcified coronary lesion: Secondary | ICD-10-CM

## 2020-10-19 DIAGNOSIS — R0600 Dyspnea, unspecified: Secondary | ICD-10-CM

## 2020-10-19 DIAGNOSIS — I2510 Atherosclerotic heart disease of native coronary artery without angina pectoris: Secondary | ICD-10-CM

## 2020-10-19 MED ORDER — METOPROLOL TARTRATE 50 MG PO TABS: 50.0000 mg | ORAL_TABLET | Freq: Two times a day (BID) | ORAL | 3 refills | Status: DC

## 2020-10-24 DIAGNOSIS — R109 Unspecified abdominal pain: Secondary | ICD-10-CM | POA: Diagnosis not present

## 2020-10-24 DIAGNOSIS — K7460 Unspecified cirrhosis of liver: Secondary | ICD-10-CM | POA: Diagnosis not present

## 2020-10-24 DIAGNOSIS — Z20822 Contact with and (suspected) exposure to covid-19: Secondary | ICD-10-CM | POA: Diagnosis not present

## 2020-10-24 DIAGNOSIS — Z299 Encounter for prophylactic measures, unspecified: Secondary | ICD-10-CM | POA: Diagnosis not present

## 2020-10-24 DIAGNOSIS — R110 Nausea: Secondary | ICD-10-CM | POA: Diagnosis not present

## 2020-10-25 DIAGNOSIS — Z1159 Encounter for screening for other viral diseases: Secondary | ICD-10-CM | POA: Diagnosis not present

## 2020-10-30 DIAGNOSIS — I10 Essential (primary) hypertension: Secondary | ICD-10-CM | POA: Diagnosis not present

## 2020-10-30 DIAGNOSIS — R059 Cough, unspecified: Secondary | ICD-10-CM | POA: Diagnosis not present

## 2020-10-30 DIAGNOSIS — F331 Major depressive disorder, recurrent, moderate: Secondary | ICD-10-CM | POA: Diagnosis not present

## 2020-10-30 DIAGNOSIS — M545 Low back pain, unspecified: Secondary | ICD-10-CM | POA: Diagnosis not present

## 2020-10-30 DIAGNOSIS — Z299 Encounter for prophylactic measures, unspecified: Secondary | ICD-10-CM | POA: Diagnosis not present

## 2020-11-02 DIAGNOSIS — B373 Candidiasis of vulva and vagina: Secondary | ICD-10-CM | POA: Diagnosis not present

## 2020-11-02 DIAGNOSIS — N390 Urinary tract infection, site not specified: Secondary | ICD-10-CM | POA: Diagnosis not present

## 2020-11-02 DIAGNOSIS — M545 Low back pain, unspecified: Secondary | ICD-10-CM | POA: Diagnosis not present

## 2020-11-02 DIAGNOSIS — Z299 Encounter for prophylactic measures, unspecified: Secondary | ICD-10-CM | POA: Diagnosis not present

## 2020-11-12 ENCOUNTER — Other Ambulatory Visit: Payer: Self-pay

## 2020-11-12 DIAGNOSIS — E1165 Type 2 diabetes mellitus with hyperglycemia: Secondary | ICD-10-CM

## 2020-11-12 DIAGNOSIS — IMO0002 Reserved for concepts with insufficient information to code with codable children: Secondary | ICD-10-CM

## 2020-11-12 MED ORDER — TRESIBA 100 UNIT/ML ~~LOC~~ SOLN: 50.0000 [IU] | Freq: Every day | SUBCUTANEOUS | 1 refills | Status: AC

## 2020-11-13 DIAGNOSIS — R110 Nausea: Secondary | ICD-10-CM | POA: Diagnosis not present

## 2020-11-13 DIAGNOSIS — Z299 Encounter for prophylactic measures, unspecified: Secondary | ICD-10-CM | POA: Diagnosis not present

## 2020-11-13 DIAGNOSIS — I10 Essential (primary) hypertension: Secondary | ICD-10-CM | POA: Diagnosis not present

## 2020-11-13 DIAGNOSIS — F419 Anxiety disorder, unspecified: Secondary | ICD-10-CM | POA: Diagnosis not present

## 2020-11-13 DIAGNOSIS — R059 Cough, unspecified: Secondary | ICD-10-CM | POA: Diagnosis not present

## 2020-11-14 ENCOUNTER — Telehealth (HOSPITAL_COMMUNITY): Payer: Self-pay | Admitting: Psychiatry

## 2020-11-14 NOTE — Telephone Encounter (Signed)
Called to schedule f/u appt, left vm

## 2020-11-24 DIAGNOSIS — R51 Headache with orthostatic component, not elsewhere classified: Secondary | ICD-10-CM | POA: Diagnosis not present

## 2020-11-24 DIAGNOSIS — W208XXA Other cause of strike by thrown, projected or falling object, initial encounter: Secondary | ICD-10-CM | POA: Diagnosis not present

## 2020-11-24 DIAGNOSIS — M79605 Pain in left leg: Secondary | ICD-10-CM | POA: Diagnosis not present

## 2020-11-24 DIAGNOSIS — M549 Dorsalgia, unspecified: Secondary | ICD-10-CM | POA: Diagnosis not present

## 2020-11-24 DIAGNOSIS — M542 Cervicalgia: Secondary | ICD-10-CM | POA: Diagnosis not present

## 2020-11-24 DIAGNOSIS — R519 Headache, unspecified: Secondary | ICD-10-CM | POA: Diagnosis not present

## 2020-11-24 DIAGNOSIS — Z881 Allergy status to other antibiotic agents status: Secondary | ICD-10-CM | POA: Diagnosis not present

## 2020-11-24 DIAGNOSIS — M79662 Pain in left lower leg: Secondary | ICD-10-CM | POA: Diagnosis not present

## 2020-11-24 DIAGNOSIS — M545 Low back pain, unspecified: Secondary | ICD-10-CM | POA: Diagnosis not present

## 2020-11-24 DIAGNOSIS — W1809XA Striking against other object with subsequent fall, initial encounter: Secondary | ICD-10-CM | POA: Diagnosis not present

## 2020-11-26 DIAGNOSIS — M545 Low back pain, unspecified: Secondary | ICD-10-CM | POA: Diagnosis not present

## 2020-11-26 DIAGNOSIS — Z6835 Body mass index (BMI) 35.0-35.9, adult: Secondary | ICD-10-CM | POA: Diagnosis not present

## 2020-11-26 DIAGNOSIS — I10 Essential (primary) hypertension: Secondary | ICD-10-CM | POA: Diagnosis not present

## 2020-11-26 DIAGNOSIS — Z299 Encounter for prophylactic measures, unspecified: Secondary | ICD-10-CM | POA: Diagnosis not present

## 2020-11-27 DIAGNOSIS — E1165 Type 2 diabetes mellitus with hyperglycemia: Secondary | ICD-10-CM | POA: Diagnosis not present

## 2020-11-27 DIAGNOSIS — E559 Vitamin D deficiency, unspecified: Secondary | ICD-10-CM | POA: Diagnosis not present

## 2020-11-27 DIAGNOSIS — E039 Hypothyroidism, unspecified: Secondary | ICD-10-CM | POA: Diagnosis not present

## 2020-11-27 DIAGNOSIS — E118 Type 2 diabetes mellitus with unspecified complications: Secondary | ICD-10-CM | POA: Diagnosis not present

## 2020-11-27 DIAGNOSIS — Z794 Long term (current) use of insulin: Secondary | ICD-10-CM | POA: Diagnosis not present

## 2020-11-28 ENCOUNTER — Telehealth: Payer: Self-pay

## 2020-11-28 LAB — COMPREHENSIVE METABOLIC PANEL
ALT: 18 IU/L (ref 0–32)
AST: 29 IU/L (ref 0–40)
Albumin/Globulin Ratio: 1 — ABNORMAL LOW (ref 1.2–2.2)
Albumin: 3.9 g/dL (ref 3.8–4.9)
Alkaline Phosphatase: 125 IU/L — ABNORMAL HIGH (ref 44–121)
BUN/Creatinine Ratio: 13 (ref 9–23)
BUN: 11 mg/dL (ref 6–24)
Bilirubin Total: 0.5 mg/dL (ref 0.0–1.2)
CO2: 17 mmol/L — ABNORMAL LOW (ref 20–29)
Calcium: 9.3 mg/dL (ref 8.7–10.2)
Chloride: 110 mmol/L — ABNORMAL HIGH (ref 96–106)
Creatinine, Ser: 0.86 mg/dL (ref 0.57–1.00)
GFR calc Af Amer: 86 mL/min/{1.73_m2} (ref >59)
GFR calc non Af Amer: 74 mL/min/{1.73_m2} (ref >59)
Globulin, Total: 3.9 g/dL (ref 1.5–4.5)
Glucose: 31 mg/dL — CL (ref 65–99)
Potassium: 3.7 mmol/L (ref 3.5–5.2)
Sodium: 144 mmol/L (ref 134–144)
Total Protein: 7.8 g/dL (ref 6.0–8.5)

## 2020-11-28 LAB — LIPID PANEL
Chol/HDL Ratio: 3.2 ratio (ref 0.0–4.4)
Cholesterol, Total: 140 mg/dL (ref 100–199)
HDL: 44 mg/dL (ref >39)
LDL Chol Calc (NIH): 77 mg/dL (ref 0–99)
Triglycerides: 105 mg/dL (ref 0–149)
VLDL Cholesterol Cal: 19 mg/dL (ref 5–40)

## 2020-11-28 LAB — T4, FREE: Free T4: 1.38 ng/dL (ref 0.82–1.77)

## 2020-11-28 LAB — VITAMIN D 25 HYDROXY (VIT D DEFICIENCY, FRACTURES): Vit D, 25-Hydroxy: 39.8 ng/mL (ref 30.0–100.0)

## 2020-11-28 LAB — TSH: TSH: 0.491 u[IU]/mL (ref 0.450–4.500)

## 2020-11-28 NOTE — Telephone Encounter (Signed)
That's too low!  Let's decrease her Tresiba to 35 units nightly and decrease her prandial insulin to 4-10 units per SSI TID with meals if glucose is above 90 AND she is eating.  She can continue with her Metformin.

## 2020-11-28 NOTE — Telephone Encounter (Signed)
Returned call to patient and advised of changes, patient verbalized understanding

## 2020-11-28 NOTE — Telephone Encounter (Signed)
Received critical lab result from Barrett at [place] of Glucose 31 that was drawn on 11/27/20 at 1041 am. I called patient and she stated that her BG have been running low, 2/22 42 at lunch, 109 at dinner, 2/21 51 at 3pm, 59 at dinner, [phone] at 1247, 2/20 132 am. Stated that she is taking Metformin 558m 2 daily, Tresiba 50units at hs, and 10 units tid of fast acting, Stated that she isn't taking if BG is less than 90.

## 2020-11-29 ENCOUNTER — Other Ambulatory Visit: Payer: Self-pay

## 2020-11-29 ENCOUNTER — Ambulatory Visit (INDEPENDENT_AMBULATORY_CARE_PROVIDER_SITE_OTHER): Payer: Medicare Other | Admitting: Clinical

## 2020-11-29 DIAGNOSIS — F331 Major depressive disorder, recurrent, moderate: Secondary | ICD-10-CM | POA: Diagnosis not present
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
## 2020-12-04 ENCOUNTER — Encounter (INDEPENDENT_AMBULATORY_CARE_PROVIDER_SITE_OTHER): Payer: Self-pay | Admitting: Internal Medicine

## 2020-12-04 ENCOUNTER — Telehealth (INDEPENDENT_AMBULATORY_CARE_PROVIDER_SITE_OTHER): Payer: Self-pay | Admitting: *Deleted

## 2020-12-04 ENCOUNTER — Ambulatory Visit (INDEPENDENT_AMBULATORY_CARE_PROVIDER_SITE_OTHER): Payer: Medicare Other | Admitting: Internal Medicine

## 2020-12-04 NOTE — Telephone Encounter (Signed)
Patient was a no show to see Dr.Rehman today for her office visit.

## 2020-12-10 ENCOUNTER — Other Ambulatory Visit: Payer: Self-pay | Admitting: Nurse Practitioner

## 2020-12-10 DIAGNOSIS — E039 Hypothyroidism, unspecified: Secondary | ICD-10-CM

## 2020-12-18 ENCOUNTER — Ambulatory Visit: Payer: Medicare Other | Admitting: Cardiology

## 2020-12-18 ENCOUNTER — Other Ambulatory Visit: Payer: Self-pay

## 2020-12-18 ENCOUNTER — Encounter: Payer: Self-pay | Admitting: Cardiology

## 2020-12-18 VITALS — BP 135/88 | HR 72 | Temp 98.2°F | Resp 16 | Ht 61.0 in | Wt 181.0 lb

## 2020-12-18 DIAGNOSIS — R0609 Other forms of dyspnea: Secondary | ICD-10-CM

## 2020-12-18 DIAGNOSIS — I2584 Coronary atherosclerosis due to calcified coronary lesion: Secondary | ICD-10-CM

## 2020-12-18 DIAGNOSIS — Z8249 Family history of ischemic heart disease and other diseases of the circulatory system: Secondary | ICD-10-CM

## 2020-12-18 DIAGNOSIS — I2510 Atherosclerotic heart disease of native coronary artery without angina pectoris: Secondary | ICD-10-CM | POA: Diagnosis not present

## 2020-12-18 DIAGNOSIS — I10 Essential (primary) hypertension: Secondary | ICD-10-CM | POA: Diagnosis not present

## 2020-12-18 DIAGNOSIS — R0600 Dyspnea, unspecified: Secondary | ICD-10-CM

## 2020-12-18 NOTE — Progress Notes (Signed)
Date:  12/18/2020   ID:  [name], DOB 20-Jan-1961, MRN [id number]  PCP:  Glenda Chroman, MD  Cardiologist:  Rex Kras, DO, [facility] (established care 05/22/2020)  Date: 12/18/20 Last Office Visit: 09/19/2020  Chief Complaint  Patient presents with  . Dyspnea on exertion  . Follow-up    3 month    HPI  [name] is a 60 y.o. female who presents to the office with a chief complaint of "15-monthfollow-up for reevaluation of dyspnea." Patient's past medical history and cardiovascular risk factors include: Insulin-dependent diabetes mellitus type 2, hypertension with chronic kidney disease stage III, dyslipidemia, coronary artery calcification, obesity due to excess calories, family history of premature coronary artery disease.  Patient is referred to the office at the request of her primary care provider for evaluation of dyspnea on exertion.  Since establishing care patient has undergone extensive cardiovascular evaluation including a coronary CTA.  The coronary CTA noted that the patient has coronary artery calcification without any significant obstructive epicardial coronary disease.  The plan was to improve her modifiable cardiovascular risk factors with aggressive lifestyle modification.  Since last office visit patient states that her effort related dyspnea has improved since up titration of beta-blocker therapy.  She is also was diagnosed with pneumonia and bronchitis in the interim that have impacted her overall dyspnea.  At the last visit she was recommended to follow-up with pulmonary medicine to rule out organic disease and also to be considered for sleep study.  However, patient states that she has not had a time to follow-up with pulmonary medicine as of yet.  Patient states that she has increased her physical activity since last office visit and has lost approximately 4 pounds.  She is congratulated on her efforts.  Patient had labs performed on 11/27/2020 which were  independently reviewed at today's office visit.  Patient is informed that her alkaline phosphatase is mildly elevated and should be followed up to see if any additional work-up is needed.  Patient states that she does have a GI provider that she sees on a regular basis and will follow up.  Of note, patient is on Diamox due to her underlying Mnire's disease.  Family history of premature CAD: Dad had MI at age of 478   FUNCTIONAL STATUS: Walks her dogs 20-30 minutes twice a day.    ALLERGIES: Allergies  Allergen Reactions  . Flagyl [Metronidazole Hcl] Itching and Rash  . Nsaids Nausea Only    MEDICATION LIST PRIOR TO VISIT: Current Meds  Medication Sig  . acetaZOLAMIDE (DIAMOX) 250 MG tablet Take 250 mg by mouth daily.   .Marland Kitchenacyclovir (ZOVIRAX) 400 MG tablet Take 400 mg by mouth 3 (three) times daily as needed (for fever blisters).   .Marland Kitchenaspirin EC 81 MG tablet Take 1 tablet (81 mg total) by mouth daily. Swallow whole.  .Marland Kitchenatorvastatin (LIPITOR) 20 MG tablet TAKE ONE TABLET BY MOUTH DAILY (MORNING PER PT)  . baclofen (LIORESAL) 10 MG tablet   . Cholecalciferol (VITAMIN D3) 2000 units capsule Take 4,000 Units by mouth daily.   . Continuous Blood Gluc Sensor (FREESTYLE LIBRE 14 DAY SENSOR) MISC Inject 1 each into the skin every 14 (fourteen) days. Use as directed.  . cyanocobalamin (,VITAMIN B-12,) 1000 MCG/ML injection Inject 1,000 mcg into the muscle every 30 (thirty) days.  . diazepam (VALIUM) 2 MG tablet Take 5 mg by mouth every 12 (twelve) hours as needed for anxiety.   . dicyclomine (BENTYL) 20  MG tablet TAKE ONE TABLET BY MOUTH THREE TIMES DAILY  . DULoxetine (CYMBALTA) 60 MG capsule Take 60 mg by mouth daily.  Marland Kitchen estradiol (ESTRACE) 0.5 MG tablet Take 0.5 mg by mouth every evening.   . fluticasone (FLONASE) 50 MCG/ACT nasal spray Place 2 sprays into both nostrils every morning.  Marland Kitchen glucose blood (FREESTYLE LITE) test strip Use as instructed  . HYDROcodone-acetaminophen (NORCO/VICODIN)  5-325 MG tablet Take 1 tablet by mouth every 6 (six) hours as needed for severe pain.  Marland Kitchen insulin aspart (NOVOLOG) 100 UNIT/ML injection Inject 10-16 Units into the skin 3 (three) times daily with meals.  . Insulin Degludec (TRESIBA) 100 UNIT/ML SOLN Inject 50 Units into the skin at bedtime.  . Insulin Syringe-Needle U-100 (BD INSULIN SYRINGE U/F) 31G X 5/16" 1 ML MISC Use as directed to inject insulin four times daily  . levothyroxine (SYNTHROID) 100 MCG tablet TAKE ONE TABLET BY MOUTH DAILY BEFORE BREAKFAST  . lisinopril (ZESTRIL) 20 MG tablet Take 20 mg by mouth daily.   . metFORMIN (GLUCOPHAGE) 500 MG tablet TAKE ONE TABLET BY MOUTH TWICE DAILY. TAKE WITH A MEAL. (MORNING ,EVENING)  . metoprolol tartrate (LOPRESSOR) 50 MG tablet Take 1 tablet (50 mg total) by mouth 2 (two) times daily.  . ondansetron (ZOFRAN) 4 MG tablet Take 1 tablet (4 mg total) by mouth every 8 (eight) hours as needed for nausea or vomiting.  . pantoprazole (PROTONIX) 40 MG tablet 40 mg daily before breakfast.   . PARoxetine (PAXIL) 40 MG tablet Take 1 tablet by mouth daily.  . Turmeric 500 MG CAPS Take 500 mg by mouth daily.      PAST MEDICAL HISTORY: Past Medical History:  Diagnosis Date  . Anxiety disorder   . Chronic low back pain   . Cirrhosis of liver ([place])   . Colitis   . Depression   . Diabetes mellitus    Type II  . Diabetic neuropathy ([place])   . Diverticulitis   . Fatty liver   . GERD (gastroesophageal reflux disease)   . High cholesterol   . Hypertension   . Irritable bowel syndrome   . Meniere's disease   . Neuropathy   . Pain     PAST SURGICAL HISTORY: Past Surgical History:  Procedure Laterality Date  . ABDOMINAL HYSTERECTOMY    . BIOPSY  03/20/2017   Procedure: BIOPSY;  Surgeon: [name], MD;  [facility];  Service: Endoscopy;;  colon gastric  . bladder tact  2005  . CHOLECYSTECTOMY  08/11  . COLONOSCOPY  2208  . COLONOSCOPY  In of September 2014   @ [facility]/Dr,.[name]   . COLONOSCOPY WITH PROPOFOL N/A 03/20/2017   Procedure: COLONOSCOPY WITH PROPOFOL;  Surgeon: [name], MD;  [facility];  Service: Endoscopy;  Laterality: N/A;  . ECTOPIC PREGNANCY SURGERY  1996  . ESOPHAGOGASTRODUODENOSCOPY (EGD) WITH PROPOFOL N/A 03/20/2017   Procedure: ESOPHAGOGASTRODUODENOSCOPY (EGD) WITH PROPOFOL;  Surgeon: [name], MD;  [facility];  Service: Endoscopy;  Laterality: N/A;  12:45  . HERNIA REPAIR  09/17/11  . LIGAMENT REPAIR Left 02/10/2018   Procedure: Left  ANKLE ATFL Repair and Ligament Augmentation, Injection of Tendon Splint Application;  Surgeon: [name], DPM;  Location: [place];  Service: Podiatry;  Laterality: Left;  . [place]  2009   Preformed on the right shoulder  . OVARIAN CYST REMOVAL     right side  . PARTIAL HYSTERECTOMY  2005  . POLYPECTOMY  03/20/2017   Procedure: POLYPECTOMY;  Surgeon: [name], MD;  [facility];  Service: Endoscopy;;  colon  . TENDON REPAIR Left 02/10/2018   Procedure: PERONEAL TENDON REPAIR and Synovectomy Peroneal Tendon;  Surgeon: [name], DPM;  Location: [place];  Service: Podiatry;  Laterality: Left;  . UPPER GASTROINTESTINAL ENDOSCOPY  2008  . URETERAL STENT PLACEMENT      FAMILY HISTORY: The patient family history includes Esophageal cancer in her brother; Healthy in her mother, son, and son; Heart disease in her father.  SOCIAL HISTORY:  The patient  reports that she has never smoked. She has never used smokeless tobacco. She reports that she does not drink alcohol and does not use drugs.  REVIEW OF SYSTEMS: Review of Systems  Constitutional: Positive for weight loss. Negative for chills, fever, malaise/fatigue and weight gain.  HENT: Negative for hoarse voice and nosebleeds.   Eyes: Negative for discharge, double vision and pain.  Cardiovascular: Positive for dyspnea on exertion (improving). Negative for claudication, leg swelling, near-syncope,  orthopnea, palpitations, paroxysmal nocturnal dyspnea and syncope.  Respiratory: Negative for hemoptysis and shortness of breath.   Musculoskeletal: Negative for muscle cramps and myalgias.  Gastrointestinal: Negative for abdominal pain, constipation, diarrhea, hematemesis, hematochezia, melena, nausea and vomiting.  Neurological: Negative for dizziness and light-headedness.    PHYSICAL EXAM: Vitals with BMI 12/18/2020 09/19/2020 09/17/2020  Height 5' 1"  5' 1"  5' 1"   Weight 181 lbs 185 lbs 13 oz 185 lbs  BMI 34.22 44.01 02.72  Systolic 536 644 034  Diastolic 88 75 69  Pulse 72 61 71    CONSTITUTIONAL: Well-developed and well-nourished. No acute distress.  SKIN: Skin is warm and dry. No rash noted. No cyanosis. No pallor. No jaundice HEAD: Normocephalic and atraumatic.  EYES: No scleral icterus MOUTH/THROAT: Moist oral membranes.  NECK: No JVD present. No thyromegaly noted. No carotid bruits  LYMPHATIC: No visible cervical adenopathy.  CHEST Normal respiratory effort. No intercostal retractions  LUNGS: Clear to auscultation bilaterally.  No stridor. No wheezes. No rales.  CARDIOVASCULAR: Regular rate and rhythm, positive V4-Q5, soft holosystolic murmur heard at the apex, no gallops or rubs ABDOMINAL: No apparent ascites.  EXTREMITIES: No peripheral edema  HEMATOLOGIC: No significant bruising NEUROLOGIC: Oriented to person, place, and time. Nonfocal. Normal muscle tone.  PSYCHIATRIC: Normal mood and affect. Normal behavior. Cooperative  CARDIAC DATABASE: EKG: 12/18/2020: Normal sinus rhythm, 65 bpm, without underlying ischemia or injury pattern.  Echocardiogram: 04/23/2020 performed at [facility] internal medicine: Per report patient's LVEF is 55 to 60%, Doppler evidence of left ventricular diastolic dysfunction, aortic valve sclerosis without stenosis, trace MR, mild TR, RVSP 19 mmHg.  Stress Testing: NA  Heart Catheterization: None  Coronary CTA 06/2020: Coronary calcium score of  105. This was 35th percentile for age and sex matched control. Normal coronary origin with right dominance. CADRADS = 3. Calcified plaque noted at the level of the trifurcation giving rise to moderate stenosis at the level of the ostial LAD and ostial ramus. Severity of the ledsion may be overestimated due to blooming artifact. Therefore the study will be sent to CT FFR for functional assessment. Aortic atherosclerosis. CT FFR analysis showed no significant stenosis.  LABORATORY DATA: CBC Latest Ref Rng & Units 06/05/2020 05/27/2019 02/10/2018  WBC 3.8 - 10.8 Thousand/uL 10.8 17.1(H) 13.9(H)  Hemoglobin 11.7 - 15.5 g/dL 13.2 14.5 13.4  Hematocrit 35.0 - 45.0 % 39.0 44.3 39.9  Platelets 140 - 400 Thousand/uL 205 266 189   Lipid  Panel  Lab Results  Component Value Date   CHOL 140 11/27/2020   HDL 44 11/27/2020   LDLCALC 77 11/27/2020   TRIG 105 11/27/2020   CHOLHDL 3.2 11/27/2020    No components found for: NTPROBNP No results for input(s): PROBNP in the last 8760 hours. Recent Labs    01/24/20 0754 09/04/20 1346 11/27/20 1041  TSH 0.53 2.510 0.491    BMP Recent Labs    07/04/20 1148 09/04/20 1346 11/27/20 1041  NA 142 141 144  K 4.6 4.2 3.7  CL 106 108* 110*  CO2 23 25 17*  GLUCOSE 148* 51* 31*  BUN 14 20 11   CREATININE 1.01* 0.89 0.86  CALCIUM 8.8 9.1 9.3  GFRNONAA 62 72 74  GFRAA 71 83 86    HEMOGLOBIN A1C Lab Results  Component Value Date   HGBA1C 7.7 (A) 09/17/2020   MPG 206 07/28/2019   IMPRESSION:    ICD-10-CM   1. Dyspnea on exertion  R06.00 EKG 12-Lead  2. Coronary atherosclerosis due to calcified coronary lesion of native artery  I25.10    I25.84   3. Nonobstructive atherosclerosis of coronary artery  I25.10   4. Family history of premature CAD  Z82.49   5. Benign hypertension  I10      RECOMMENDATIONS: MARCILE FUQUAY is a 60 y.o. female whose past medical history and cardiac risk factors include: Insulin-dependent diabetes mellitus type 2,  hypertension with chronic kidney disease stage III, dyslipidemia, coronary artery calcification, obesity due to excess calories, family history of premature coronary artery disease.  Dyspnea on exertion: Improving  From a cardiovascular standpoint patient is relatively stable.  Has undergone coronary CTA which noted moderate coronary artery calcification without obstructive epicardial coronary artery disease.  Educated on the importance of guideline directed medical therapy and improving her modifiable cardiovascular risk factors.  Patient is encouraged to follow-up with pulmonary medicine they can be considered for PFT testing as well as to be evaluated for sleep apnea.   Encouraged to increase her physical activity as tolerated with a goal of 30 minutes a day 5 days a week.  Continue current medical therapy.  Independently reviewed labs from 11/27/2020.  Of note, her BNP was within normal limits back in September 2021.    Coronary artery calcification:   Continue aspirin and statin therapy.    Educated on importance of secondary prevention.  Family history of premature coronary disease: Continue statin therapy.  See discussion above.  Benign essential hypertension with chronic kidney disease stage II . Medication reconciled.  . Currently managed by primary care provider. . Low salt diet recommended. A diet that is rich in fruits, vegetables, legumes, and low-fat dairy products and low in snacks, sweets, and meats (such as the Dietary Approaches to Stop Hypertension [DASH] diet).   Dyslipidemia: Continue statin therapy.  Currently managed by primary care provider.  Recommend LDL less than 70 mg/dL.  Insulin-dependent diabetes mellitus type 2:  Most recent hemoglobin A1c 7.7.  Educated on the importance of glycemic control given her coronary artery calcification and nonobstructive CAD.  Currently managed by primary care provider.  FINAL MEDICATION LIST END OF ENCOUNTER: No  orders of the defined types were placed in this encounter.   Medications Discontinued During This Encounter  Medication Reason  . rifaximin (XIFAXAN) 550 MG TABS tablet Error  . fluconazole (DIFLUCAN) 150 MG tablet Error     Current Outpatient Medications:  .  acetaZOLAMIDE (DIAMOX) 250 MG tablet, Take 250 mg by mouth  daily. , Disp: , Rfl:  .  acyclovir (ZOVIRAX) 400 MG tablet, Take 400 mg by mouth 3 (three) times daily as needed (for fever blisters). , Disp: , Rfl:  .  aspirin EC 81 MG tablet, Take 1 tablet (81 mg total) by mouth daily. Swallow whole., Disp: 90 tablet, Rfl: 3 .  atorvastatin (LIPITOR) 20 MG tablet, TAKE ONE TABLET BY MOUTH DAILY (MORNING PER PT), Disp: 28 tablet, Rfl: 3 .  baclofen (LIORESAL) 10 MG tablet, , Disp: , Rfl:  .  Cholecalciferol (VITAMIN D3) 2000 units capsule, Take 4,000 Units by mouth daily. , Disp: , Rfl:  .  Continuous Blood Gluc Sensor (FREESTYLE LIBRE 14 DAY SENSOR) MISC, Inject 1 each into the skin every 14 (fourteen) days. Use as directed., Disp: 2 each, Rfl: 2 .  cyanocobalamin (,VITAMIN B-12,) 1000 MCG/ML injection, Inject 1,000 mcg into the muscle every 30 (thirty) days., Disp: , Rfl: 5 .  diazepam (VALIUM) 2 MG tablet, Take 5 mg by mouth every 12 (twelve) hours as needed for anxiety. , Disp: , Rfl:  .  dicyclomine (BENTYL) 20 MG tablet, TAKE ONE TABLET BY MOUTH THREE TIMES DAILY, Disp: 90 tablet, Rfl: 5 .  DULoxetine (CYMBALTA) 60 MG capsule, Take 60 mg by mouth daily., Disp: , Rfl:  .  estradiol (ESTRACE) 0.5 MG tablet, Take 0.5 mg by mouth every evening. , Disp: , Rfl:  .  fluticasone (FLONASE) 50 MCG/ACT nasal spray, Place 2 sprays into both nostrils every morning., Disp: , Rfl:  .  glucose blood (FREESTYLE LITE) test strip, Use as instructed, Disp: 100 each, Rfl: 2 .  HYDROcodone-acetaminophen (NORCO/VICODIN) 5-325 MG tablet, Take 1 tablet by mouth every 6 (six) hours as needed for severe pain., Disp: 12 tablet, Rfl: 0 .  insulin aspart (NOVOLOG)  100 UNIT/ML injection, Inject 10-16 Units into the skin 3 (three) times daily with meals., Disp: 40 mL, Rfl: 3 .  Insulin Degludec (TRESIBA) 100 UNIT/ML SOLN, Inject 50 Units into the skin at bedtime., Disp: 20 mL, Rfl: 1 .  Insulin Syringe-Needle U-100 (BD INSULIN SYRINGE U/F) 31G X 5/16" 1 ML MISC, Use as directed to inject insulin four times daily, Disp: 100 each, Rfl: 3 .  levothyroxine (SYNTHROID) 100 MCG tablet, TAKE ONE TABLET BY MOUTH DAILY BEFORE BREAKFAST, Disp: 90 tablet, Rfl: 0 .  lisinopril (ZESTRIL) 20 MG tablet, Take 20 mg by mouth daily. , Disp: , Rfl:  .  metFORMIN (GLUCOPHAGE) 500 MG tablet, TAKE ONE TABLET BY MOUTH TWICE DAILY. TAKE WITH A MEAL. (MORNING ,EVENING), Disp: 56 tablet, Rfl: 3 .  metoprolol tartrate (LOPRESSOR) 50 MG tablet, Take 1 tablet (50 mg total) by mouth 2 (two) times daily., Disp: 180 tablet, Rfl: 3 .  ondansetron (ZOFRAN) 4 MG tablet, Take 1 tablet (4 mg total) by mouth every 8 (eight) hours as needed for nausea or vomiting., Disp: 12 tablet, Rfl: 0 .  pantoprazole (PROTONIX) 40 MG tablet, 40 mg daily before breakfast. , Disp: , Rfl: 4 .  PARoxetine (PAXIL) 40 MG tablet, Take 1 tablet by mouth daily., Disp: , Rfl:  .  Turmeric 500 MG CAPS, Take 500 mg by mouth daily. , Disp: , Rfl:   Orders Placed This Encounter  Procedures  . EKG 12-Lead    There are no Patient Instructions on file for this visit.   --Continue cardiac medications as reconciled in final medication list. --No follow-ups on file. Or sooner if needed. --Continue follow-up with your primary care physician regarding the management  of your other chronic comorbid conditions.  Patient's questions and concerns were addressed to her satisfaction. She voices understanding of the instructions provided during this encounter.   This note was created using a voice recognition software as a result there may be grammatical errors inadvertently enclosed that do not reflect the nature of this encounter.  Every attempt is made to correct such errors.  Rex Kras, [place], [facility]  Pager: [phone] Office: [phone]

## 2020-12-19 ENCOUNTER — Other Ambulatory Visit: Payer: Self-pay

## 2020-12-19 DIAGNOSIS — I2510 Atherosclerotic heart disease of native coronary artery without angina pectoris: Secondary | ICD-10-CM

## 2020-12-19 DIAGNOSIS — I2584 Coronary atherosclerosis due to calcified coronary lesion: Secondary | ICD-10-CM

## 2020-12-19 DIAGNOSIS — E785 Hyperlipidemia, unspecified: Secondary | ICD-10-CM

## 2020-12-26 ENCOUNTER — Ambulatory Visit (INDEPENDENT_AMBULATORY_CARE_PROVIDER_SITE_OTHER): Payer: Medicare Other | Admitting: Clinical

## 2020-12-26 ENCOUNTER — Other Ambulatory Visit: Payer: Self-pay

## 2020-12-26 DIAGNOSIS — F331 Major depressive disorder, recurrent, moderate: Secondary | ICD-10-CM

## 2020-12-26 DIAGNOSIS — F339 Major depressive disorder, recurrent, unspecified: Secondary | ICD-10-CM | POA: Diagnosis not present

## 2020-12-26 DIAGNOSIS — K7460 Unspecified cirrhosis of liver: Secondary | ICD-10-CM | POA: Diagnosis not present

## 2020-12-26 DIAGNOSIS — M4807 Spinal stenosis, lumbosacral region: Secondary | ICD-10-CM | POA: Diagnosis not present

## 2020-12-26 DIAGNOSIS — Z299 Encounter for prophylactic measures, unspecified: Secondary | ICD-10-CM | POA: Diagnosis not present

## 2020-12-26 DIAGNOSIS — Z79899 Other long term (current) drug therapy: Secondary | ICD-10-CM | POA: Diagnosis not present

## 2020-12-26 DIAGNOSIS — Z794 Long term (current) use of insulin: Secondary | ICD-10-CM | POA: Diagnosis not present

## 2020-12-26 NOTE — Progress Notes (Signed)
Virtual Visit via Telephone Note  I connected withSandra M. Valdez on 12/26/20 at  10:00 AM EDT by telephoneand verified that I am speaking with the correct person using two identifiers.  Location: Patient: Home Provider: Office  I discussed the limitations, risks, security and privacy concerns of performing an evaluation and management service by telephone and the availability of in person appointments. I also discussed with the patient that there may be a patient responsible charge related to this service. The patient expressed understanding and agreed to proceed.  THERAPIST PROGRESS NOTE  Session Time:10:00AM-10:45AM  Participation Level:Active  Behavioral Response:CasualAlertDepressed  Type of Therapy: Individual Therapy  Treatment Goals addressed: Coping  Interventions:CBT, Strength-based and Supportive  Summary: [name] is a 60y.o. female who presents with Depression.The OPT therapist worked with thepatientfor herongoingscheduledsession. The OPT therapist utilized Motivational Interviewing to assist in creating therapeutic repore. The patient in the session was engaged and work in Science writer about hertriggers and symptoms over the past few weeks.The OPT therapist utilized Cognitive Behavioral Therapy through cognitive restructuring as well as worked with the patientonpositive self talk and challenging negative thinking.The OPT therapist worked Chief Financial Officer the patient to make changesincluding being more active.The patienthas decreased her excessive sleep and has been more active in the past week at home and in the community. The patient is planning a move and relocating out of state at some point soon in the next few months.  Suicidal/Homicidal:Nowithout intent/plan  Therapist Response: The OPT therapist worked with the patient for the patients scheduled session. The patient was engaged in hersession and gave feedback in  relation to triggers, symptoms, and behavior responses over the pastfewweeks. The OPT therapist worked with the patient utilizing an in session Cognitive Behavioral Therapy exercise. The patient was responsive in the session and verbalized, " Ithings have been getting better and I think the medication is  working for me I have been getting out of the house more I know the things I need to do I just have to work on doing them and not let others negativity get to me".The OPT therapist worked with the patient in challenge negative thinkingand making changes to reduce triggeringand encouraged the patient to continue in being active.The OPT therapist willcontinue to work with the patient in her next scheduled session.  Plan:Meet again in2/3 weeks.  Diagnosis:Axis I:Depression/Anxiety/Schizoaffective Disorder/ and PTSD Axis II:No diagnosis  I discussed the assessment and treatment plan with the patient. The patient was provided an opportunity to ask questions and all were answered. The patient agreed with the plan and demonstrated an understanding of the instructions.  The patient was advised to call back or seek an in-person evaluation if the symptoms worsen or if the condition fails to improve as anticipated.  I provided78mnutes of non-face-to-face time during this encounter.  TLennox Grumbles LCSW  12/26/2020

## 2020-12-27 DIAGNOSIS — H26492 Other secondary cataract, left eye: Secondary | ICD-10-CM | POA: Diagnosis not present

## 2021-01-16 ENCOUNTER — Ambulatory Visit: Payer: Medicare Other | Admitting: Nurse Practitioner

## 2021-01-17 ENCOUNTER — Ambulatory Visit (HOSPITAL_COMMUNITY): Payer: Medicare Other | Admitting: Clinical

## 2021-01-17 ENCOUNTER — Other Ambulatory Visit: Payer: Self-pay

## 2021-01-22 DIAGNOSIS — M5441 Lumbago with sciatica, right side: Secondary | ICD-10-CM | POA: Diagnosis not present

## 2021-01-22 DIAGNOSIS — M5442 Lumbago with sciatica, left side: Secondary | ICD-10-CM | POA: Diagnosis not present

## 2021-01-22 DIAGNOSIS — I671 Cerebral aneurysm, nonruptured: Secondary | ICD-10-CM | POA: Diagnosis not present

## 2021-01-22 DIAGNOSIS — G8929 Other chronic pain: Secondary | ICD-10-CM | POA: Diagnosis not present

## 2021-01-23 ENCOUNTER — Other Ambulatory Visit: Payer: Self-pay | Admitting: Neurosurgery

## 2021-01-23 DIAGNOSIS — I671 Cerebral aneurysm, nonruptured: Secondary | ICD-10-CM

## 2021-01-25 ENCOUNTER — Encounter (HOSPITAL_COMMUNITY): Payer: Self-pay | Admitting: *Deleted

## 2021-01-25 ENCOUNTER — Emergency Department (HOSPITAL_COMMUNITY)
Admission: EM | Admit: 2021-01-25 | Discharge: 2021-01-26 | Disposition: A | Discharge status: Home / Self Care | Payer: Medicare Other | Attending: Emergency Medicine | Admitting: Emergency Medicine

## 2021-01-25 ENCOUNTER — Other Ambulatory Visit: Payer: Self-pay

## 2021-01-25 DIAGNOSIS — F14151 Cocaine abuse with cocaine-induced psychotic disorder with hallucinations: Secondary | ICD-10-CM | POA: Diagnosis not present

## 2021-01-25 DIAGNOSIS — I6523 Occlusion and stenosis of bilateral carotid arteries: Secondary | ICD-10-CM | POA: Diagnosis not present

## 2021-01-25 DIAGNOSIS — F600 Paranoid personality disorder: Secondary | ICD-10-CM | POA: Diagnosis not present

## 2021-01-25 DIAGNOSIS — I10 Essential (primary) hypertension: Secondary | ICD-10-CM | POA: Insufficient documentation

## 2021-01-25 DIAGNOSIS — R410 Disorientation, unspecified: Secondary | ICD-10-CM | POA: Diagnosis not present

## 2021-01-25 DIAGNOSIS — F22 Delusional disorders: Secondary | ICD-10-CM

## 2021-01-25 DIAGNOSIS — Z79899 Other long term (current) drug therapy: Secondary | ICD-10-CM | POA: Insufficient documentation

## 2021-01-25 DIAGNOSIS — F29 Unspecified psychosis not due to a substance or known physiological condition: Secondary | ICD-10-CM | POA: Diagnosis not present

## 2021-01-25 DIAGNOSIS — I517 Cardiomegaly: Secondary | ICD-10-CM | POA: Diagnosis not present

## 2021-01-25 DIAGNOSIS — E039 Hypothyroidism, unspecified: Secondary | ICD-10-CM | POA: Diagnosis not present

## 2021-01-25 DIAGNOSIS — R4182 Altered mental status, unspecified: Secondary | ICD-10-CM | POA: Diagnosis not present

## 2021-01-25 DIAGNOSIS — F32A Depression, unspecified: Secondary | ICD-10-CM | POA: Insufficient documentation

## 2021-01-25 DIAGNOSIS — R443 Hallucinations, unspecified: Secondary | ICD-10-CM

## 2021-01-25 DIAGNOSIS — Z7982 Long term (current) use of aspirin: Secondary | ICD-10-CM | POA: Diagnosis not present

## 2021-01-25 DIAGNOSIS — E1142 Type 2 diabetes mellitus with diabetic polyneuropathy: Secondary | ICD-10-CM | POA: Diagnosis not present

## 2021-01-25 DIAGNOSIS — F1410 Cocaine abuse, uncomplicated: Secondary | ICD-10-CM | POA: Diagnosis not present

## 2021-01-25 DIAGNOSIS — Z794 Long term (current) use of insulin: Secondary | ICD-10-CM | POA: Diagnosis not present

## 2021-01-25 DIAGNOSIS — Z7984 Long term (current) use of oral hypoglycemic drugs: Secondary | ICD-10-CM | POA: Diagnosis not present

## 2021-01-25 DIAGNOSIS — Z9151 Personal history of suicidal behavior: Secondary | ICD-10-CM | POA: Diagnosis not present

## 2021-01-25 DIAGNOSIS — R000 Tachycardia, unspecified: Secondary | ICD-10-CM | POA: Diagnosis not present

## 2021-01-25 DIAGNOSIS — R451 Restlessness and agitation: Secondary | ICD-10-CM | POA: Diagnosis present

## 2021-01-25 DIAGNOSIS — E1165 Type 2 diabetes mellitus with hyperglycemia: Secondary | ICD-10-CM | POA: Diagnosis not present

## 2021-01-25 LAB — COMPREHENSIVE METABOLIC PANEL
ALT: 22 U/L (ref 0–44)
AST: 36 U/L (ref 15–41)
Albumin: 3.4 g/dL — ABNORMAL LOW (ref 3.5–5.0)
Alkaline Phosphatase: 99 U/L (ref 38–126)
Anion gap: 10 (ref 5–15)
BUN: 22 mg/dL — ABNORMAL HIGH (ref 6–20)
CO2: 22 mmol/L (ref 22–32)
Calcium: 8.8 mg/dL — ABNORMAL LOW (ref 8.9–10.3)
Chloride: 108 mmol/L (ref 98–111)
Creatinine, Ser: 1.14 mg/dL — ABNORMAL HIGH (ref 0.44–1.00)
GFR, Estimated: 55 mL/min — ABNORMAL LOW (ref >60)
Glucose, Bld: 174 mg/dL — ABNORMAL HIGH (ref 70–99)
Potassium: 4.1 mmol/L (ref 3.5–5.1)
Sodium: 140 mmol/L (ref 135–145)
Total Bilirubin: 0.9 mg/dL (ref 0.3–1.2)
Total Protein: 7.3 g/dL (ref 6.5–8.1)

## 2021-01-25 LAB — CBC WITH DIFFERENTIAL/PLATELET
Abs Immature Granulocytes: 0.03 10*3/uL (ref 0.00–0.07)
Basophils Absolute: 0.1 10*3/uL (ref 0.0–0.1)
Basophils Relative: 1 %
Eosinophils Absolute: 0.1 10*3/uL (ref 0.0–0.5)
Eosinophils Relative: 1 %
HCT: 38.7 % (ref 36.0–46.0)
Hemoglobin: 12.2 g/dL (ref 12.0–15.0)
Immature Granulocytes: 0 %
Lymphocytes Relative: 38 %
Lymphs Abs: 3.2 10*3/uL (ref 0.7–4.0)
MCH: 29.6 pg (ref 26.0–34.0)
MCHC: 31.5 g/dL (ref 30.0–36.0)
MCV: 93.9 fL (ref 80.0–100.0)
Monocytes Absolute: 0.8 10*3/uL (ref 0.1–1.0)
Monocytes Relative: 10 %
Neutro Abs: 4.2 10*3/uL (ref 1.7–7.7)
Neutrophils Relative %: 50 %
Platelets: 151 10*3/uL (ref 150–400)
RBC: 4.12 MIL/uL (ref 3.87–5.11)
RDW: 14.3 % (ref 11.5–15.5)
WBC: 8.5 10*3/uL (ref 4.0–10.5)
nRBC: 0 % (ref 0.0–0.2)

## 2021-01-25 LAB — CBG MONITORING, ED: Glucose-Capillary: 293 mg/dL — ABNORMAL HIGH (ref 70–99)

## 2021-01-25 LAB — ETHANOL: Alcohol, Ethyl (B): <10 mg/dL (ref <10)

## 2021-01-25 MED ORDER — INSULIN ASPART 100 UNIT/ML ~~LOC~~ SOLN: 0.0000 [IU] | Freq: Three times a day (TID) | SUBCUTANEOUS | Status: DC

## 2021-01-25 MED ORDER — BACLOFEN 10 MG PO TABS: 10.0000 mg | ORAL_TABLET | Freq: Two times a day (BID) | ORAL | Status: DC

## 2021-01-25 MED ORDER — ASPIRIN EC 81 MG PO TBEC: 81.0000 mg | DELAYED_RELEASE_TABLET | Freq: Every day | ORAL | Status: DC

## 2021-01-25 MED ORDER — ESTRADIOL 1 MG PO TABS
0.5000 mg | ORAL_TABLET | Freq: Every evening | ORAL | Status: DC
  Filled 2021-01-25 (×2): qty 0.5

## 2021-01-25 MED ORDER — METOPROLOL TARTRATE 50 MG PO TABS: 50.0000 mg | ORAL_TABLET | Freq: Two times a day (BID) | ORAL | Status: DC

## 2021-01-25 MED ORDER — ZIPRASIDONE MESYLATE 20 MG IM SOLR
20.0000 mg | Freq: Once | INTRAMUSCULAR | Status: AC
  Administered 2021-01-25: 20 mg via INTRAMUSCULAR
  Filled 2021-01-25: qty 20

## 2021-01-25 MED ORDER — ACETAZOLAMIDE 250 MG PO TABS
250.0000 mg | ORAL_TABLET | Freq: Every day | ORAL | Status: DC
  Filled 2021-01-25 (×3): qty 1

## 2021-01-25 MED ORDER — DIAZEPAM 5 MG PO TABS: 5.0000 mg | ORAL_TABLET | Freq: Two times a day (BID) | ORAL | Status: DC | PRN

## 2021-01-25 MED ORDER — HYDROCODONE-ACETAMINOPHEN 5-325 MG PO TABS: 1.0000 | ORAL_TABLET | Freq: Four times a day (QID) | ORAL | Status: DC | PRN

## 2021-01-25 MED ORDER — ATORVASTATIN CALCIUM 10 MG PO TABS: 20.0000 mg | ORAL_TABLET | Freq: Every day | ORAL | Status: DC

## 2021-01-25 MED ORDER — LEVOTHYROXINE SODIUM 50 MCG PO TABS
100.0000 ug | ORAL_TABLET | Freq: Every day | ORAL | Status: DC
  Administered 2021-01-26: 100 ug via ORAL
  Filled 2021-01-25: qty 2

## 2021-01-25 MED ORDER — LEVOTHYROXINE SODIUM 50 MCG PO TABS: 100.0000 ug | ORAL_TABLET | Freq: Every day | ORAL | Status: DC

## 2021-01-25 NOTE — ED Notes (Signed)
Pt is resting at this time and appears much more calm and comfortable.
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
## 2021-01-25 NOTE — ED Notes (Signed)
Assisted lab with drawing of lab work. Pt was restless momentarily and has become calm once again.

## 2021-01-25 NOTE — ED Notes (Signed)
Pt refused bloodwork per lab

## 2021-01-25 NOTE — ED Provider Notes (Signed)
[facility] Provider Note   CSN: [id number] Arrival date & time: 01/25/21  1800     History Chief Complaint  Patient presents with  . Palpitations    [name] is a 60 y.o. female.  Patient presents to ER chief complaint of palpitations, hearing people having sex in one of the bedrooms of her house.  Patient states that she heard them in her house.  She had to get a fire extinguisher to break down the door.  When she opened the door she did not see anybody's but she feels like they were there and they somehow escaped.  She cannot explain to me whether he might of gone or what might of actually happened, however she becomes agitated and states that she knows that somebody was there.  She called the police and when they arrived and saw the scene of her door that she had destroyed herself she was brought to the ER for further evaluation and clearance.        Past Medical History:  Diagnosis Date  . Anxiety disorder   . Chronic low back pain   . Cirrhosis of liver ([place])   . Colitis   . Depression   . Diabetes mellitus    Type II  . Diabetic neuropathy ([place])   . Diverticulitis   . Fatty liver   . GERD (gastroesophageal reflux disease)   . High cholesterol   . Hypertension   . Irritable bowel syndrome   . Meniere's disease   . Neuropathy   . Pain     Patient Active Problem List   Diagnosis Date Noted  . Abdominal hernia 06/05/2020  . Ventral hernia 06/05/2020  . Hypothyroidism 09/24/2018  . Sprain of anterior talofibular ligament of left ankle   . Ankle instability, left   . Synovitis and tenosynovitis   . Tear of peroneal tendon   . Peroneal tendinitis of left lower extremity   . Uncontrolled type 2 diabetes mellitus with complication, with long-term current use of insulin ([place]) 01/12/2018  . Mixed hyperlipidemia 05/14/2017  . Gastroesophageal reflux disease without esophagitis 02/11/2017  . History of colonic polyps 02/11/2017  . Nausea  without vomiting 02/11/2017  . Irritable bowel syndrome 02/11/2017  . Liver cirrhosis secondary to NASH (nonalcoholic steatohepatitis) ([place]) 02/11/2017  . Pain in left foot 01/22/2017  . Pain in right foot 01/22/2017  . Metatarsal stress fracture of left foot 11/06/2016  . Diabetic polyneuropathy associated with type 2 diabetes mellitus ([place]) 11/06/2016  . Anxiety disorder 09/19/2016  . Reaction to severe stress 09/19/2016  . Hypercholesteremia 07/15/2016  . Class 1 obesity due to excess calories with serious comorbidity and body mass index (BMI) of 33.0 to 33.9 in adult 04/29/2016  . Nausea and vomiting 01/17/2014  . Essential hypertension 10/25/2013  . Fatty liver disease, nonalcoholic 46/27/0350  . IBS (irritable bowel syndrome) 02/24/2012  . Type 2 diabetes mellitus, uncontrolled ([place]) 02/24/2012  . Meniere's disease 12/09/2011    Past Surgical History:  Procedure Laterality Date  . ABDOMINAL HYSTERECTOMY    . BIOPSY  03/20/2017   Procedure: BIOPSY;  Surgeon: [name], MD;  [facility];  Service: Endoscopy;;  colon gastric  . bladder tact  2005  . CHOLECYSTECTOMY  08/11  . COLONOSCOPY  2208  . COLONOSCOPY  In of September 2014   @ [facility]/Dr,.[name]  . COLONOSCOPY WITH PROPOFOL N/A 03/20/2017   Procedure: COLONOSCOPY WITH PROPOFOL;  Surgeon: [name], MD;  [facility]  [facility];  Service: Endoscopy;  Laterality: N/A;  . ECTOPIC PREGNANCY SURGERY  1996  . ESOPHAGOGASTRODUODENOSCOPY (EGD) WITH PROPOFOL N/A 03/20/2017   Procedure: ESOPHAGOGASTRODUODENOSCOPY (EGD) WITH PROPOFOL;  Surgeon: [name], MD;  [facility];  Service: Endoscopy;  Laterality: N/A;  12:45  . HERNIA REPAIR  09/17/11  . LIGAMENT REPAIR Left 02/10/2018   Procedure: Left  ANKLE ATFL Repair and Ligament Augmentation, Injection of Tendon Splint Application;  Surgeon: [name], DPM;  Location: [facility];  Service: Podiatry;  Laterality: Left;  . [place]  2009    Preformed on the right shoulder  . OVARIAN CYST REMOVAL     right side  . PARTIAL HYSTERECTOMY  2005  . POLYPECTOMY  03/20/2017   Procedure: POLYPECTOMY;  Surgeon: [name], MD;  [facility];  Service: Endoscopy;;  colon  . TENDON REPAIR Left 02/10/2018   Procedure: PERONEAL TENDON REPAIR and Synovectomy Peroneal Tendon;  Surgeon: [name], DPM;  Location: [facility];  Service: Podiatry;  Laterality: Left;  . UPPER GASTROINTESTINAL ENDOSCOPY  2008  . URETERAL STENT PLACEMENT       OB History   No obstetric history on file.     Family History  Problem Relation Age of Onset  . Healthy Mother   . Heart disease Father   . Esophageal cancer Brother   . Healthy Son   . Healthy Son     Social History   Tobacco Use  . Smoking status: Never Smoker  . Smokeless tobacco: Never Used  Vaping Use  . Vaping Use: Never used  Substance Use Topics  . Alcohol use: No  . Drug use: No    Home Medications Prior to Admission medications   Medication Sig Start Date End Date Taking? Authorizing Provider  acetaZOLAMIDE (DIAMOX) 250 MG tablet Take 250 mg by mouth daily.  04/28/16  Yes [provider]  acyclovir (ZOVIRAX) 400 MG tablet Take 400 mg by mouth 3 (three) times daily as needed (for fever blisters).  06/25/16  Yes [provider]  albuterol (PROVENTIL) (2.5 MG/3ML) 0.083% nebulizer solution Take 2.5 mg by nebulization every 4 (four) hours as needed for shortness of breath. 10/26/20  Yes [provider]  aspirin EC 81 MG tablet Take 1 tablet (81 mg total) by mouth daily. Swallow whole. 10/02/20  Yes Tolia, Sunit, DO  atorvastatin (LIPITOR) 20 MG tablet TAKE ONE TABLET BY MOUTH DAILY (MORNING PER PT) Patient taking differently: Take 20 mg by mouth daily. 10/17/20  Yes Nida, Marella Chimes, MD  baclofen (LIORESAL) 10 MG tablet Take 10 mg by mouth 2 (two) times daily. 02/15/20  Yes [provider]  Cholecalciferol (VITAMIN D3) 2000 units  capsule Take 4,000 Units by mouth daily.    Yes [provider]  cyanocobalamin (,VITAMIN B-12,) 1000 MCG/ML injection Inject 1,000 mcg into the muscle every 30 (thirty) days. 04/14/16  Yes [provider]  diazepam (VALIUM) 2 MG tablet Take 5 mg by mouth every 12 (twelve) hours as needed for anxiety.  04/07/16  Yes [provider]  diclofenac Sodium (VOLTAREN) 1 % GEL Apply 2 g topically 4 (four) times daily. 12/18/20  Yes [provider]  dicyclomine (BENTYL) 20 MG tablet TAKE ONE TABLET BY MOUTH THREE TIMES DAILY 02/09/20  Yes Rehman, Mechele Dawley, MD  DULoxetine (CYMBALTA) 60 MG capsule Take 60 mg by mouth daily. 04/28/16  Yes [provider]  estradiol (ESTRACE) 0.5 MG tablet Take 0.5 mg  by mouth every evening.  01/27/17  Yes [provider]  fluconazole (DIFLUCAN) 150 MG tablet Take 150 mg by mouth once.   Yes [provider]  fluticasone (FLONASE) 50 MCG/ACT nasal spray Place 2 sprays into both nostrils every morning.   Yes [provider]  HYDROcodone-acetaminophen (NORCO/VICODIN) 5-325 MG tablet Take 1 tablet by mouth every 6 (six) hours as needed for severe pain. 12/30/18  Yes [name], DPM  insulin aspart (NOVOLOG) 100 UNIT/ML injection Inject 10-16 Units into the skin 3 (three) times daily with meals. Patient taking differently: Inject 15-45 Units into the skin 4 (four) times daily. 09/17/20  Yes Reardon, Juanetta Beets, NP  Insulin Degludec (TRESIBA) 100 UNIT/ML SOLN Inject 50 Units into the skin at bedtime. Patient taking differently: Inject 35 Units into the skin at bedtime. 11/12/20  Yes Reardon, Juanetta Beets, NP  levothyroxine (SYNTHROID) 100 MCG tablet TAKE ONE TABLET BY MOUTH DAILY BEFORE BREAKFAST Patient taking differently: Take 100 mcg by mouth daily before breakfast. TAKE ONE TABLET BY MOUTH DAILY BEFORE BREAKFAST 12/10/20  Yes Reardon, Loree Fee J, NP  lisinopril (ZESTRIL) 20 MG tablet Take 20 mg by mouth daily.  03/22/20   Yes [provider]  metFORMIN (GLUCOPHAGE) 500 MG tablet TAKE ONE TABLET BY MOUTH TWICE DAILY. TAKE WITH A MEAL. (MORNING ,EVENING) Patient taking differently: Take 500 mg by mouth 2 (two) times daily with a meal. 10/17/20  Yes Nida, Marella Chimes, MD  metoprolol tartrate (LOPRESSOR) 50 MG tablet Take 1 tablet (50 mg total) by mouth 2 (two) times daily. 10/19/20  Yes Tolia, Sunit, DO  Multiple Vitamin (MULTIVITAMIN) tablet Take 1 tablet by mouth daily.   Yes [provider]  ondansetron (ZOFRAN) 4 MG tablet Take 1 tablet (4 mg total) by mouth every 8 (eight) hours as needed for nausea or vomiting. 09/24/16  Yes Rolland Porter, MD  pantoprazole (PROTONIX) 40 MG tablet 40 mg daily before breakfast.  01/13/17  Yes [provider]  PARoxetine (PAXIL) 40 MG tablet Take 1 tablet by mouth daily. 08/23/20  Yes [provider]  Turmeric 500 MG CAPS Take 500 mg by mouth daily.    Yes [provider]  Continuous Blood Gluc Sensor (FREESTYLE LIBRE 14 DAY SENSOR) MISC Inject 1 each into the skin every 14 (fourteen) days. Use as directed. 05/09/19   Cassandria Anger, MD  glucose blood (FREESTYLE LITE) test strip Use as instructed 05/28/17   Cassandria Anger, MD  Insulin Syringe-Needle U-100 (BD INSULIN SYRINGE U/F) 31G X 5/16" 1 ML MISC Use as directed to inject insulin four times daily 08/23/20   Brita Romp, NP    Allergies    Flagyl [metronidazole hcl] and Nsaids  Review of Systems   Review of Systems  Constitutional: Negative for fever.  HENT: Negative for ear pain.   Eyes: Negative for pain.  Respiratory: Negative for cough.   Cardiovascular: Negative for chest pain.  Gastrointestinal: Negative for abdominal pain.  Genitourinary: Negative for flank pain.  Musculoskeletal: Negative for back pain.  Skin: Negative for rash.  Neurological: Negative for headaches.    Physical Exam Updated Vital Signs BP 98/68   Pulse 100   Temp 99.7 F (37.6  C) (Oral)   Resp 16   Ht 5' 1"  (1.549 m)   Wt 82.1 kg   SpO2 93%   BMI 34.20 kg/m   Physical Exam Constitutional:      General: She is not in acute distress.  Appearance: Normal appearance.  HENT:     Head: Normocephalic.     Nose: Nose normal.  Eyes:     Extraocular Movements: Extraocular movements intact.  Cardiovascular:     Rate and Rhythm: Normal rate.  Pulmonary:     Effort: Pulmonary effort is normal.  Musculoskeletal:        General: Normal range of motion.     Cervical back: Normal range of motion.  Neurological:     General: No focal deficit present.     Mental Status: She is alert and oriented to person, place, and time. Mental status is at baseline.     ED Results / Procedures / Treatments   Labs (all labs ordered are listed, but only abnormal results are displayed) Labs Reviewed  COMPREHENSIVE METABOLIC PANEL - Abnormal; Notable for the following components:      Result Value   Glucose, Bld 174 (*)    BUN 22 (*)    Creatinine, Ser 1.14 (*)    Calcium 8.8 (*)    Albumin 3.4 (*)    GFR, Estimated 55 (*)    All other components within normal limits  CBG MONITORING, ED - Abnormal; Notable for the following components:   Glucose-Capillary 293 (*)    All other components within normal limits  RESP PANEL BY RT-PCR (FLU A&B, COVID) ARPGX2  CBC WITH DIFFERENTIAL/PLATELET  ETHANOL  RAPID URINE DRUG SCREEN, HOSP PERFORMED    EKG EKG Interpretation  Date/Time:  Friday January 25 2021 20:04:42 EDT Ventricular Rate:  92 PR Interval:  142 QRS Duration: 97 QT Interval:  396 QTC Calculation: 490 R Axis:   7 Text Interpretation: Sinus rhythm Borderline prolonged QT interval Confirmed by Thamas Jaegers ([id number]) on 01/25/2021 9:44:48 PM   Radiology No results found.  Procedures Procedures   Medications Ordered in ED Medications  ziprasidone (GEODON) injection 20 mg (20 mg Intramuscular Given 01/25/21 2010)    ED Course  I have reviewed the triage vital  signs and the nursing notes.  Pertinent labs & imaging results that were available during my care of the patient were reviewed by me and considered in my medical decision making (see chart for details).    MDM Rules/Calculators/A&P                          Patient appears agitated with pressured speech.  However she denies any pain or discomfort.  She is convinced that somebody was in her home, she states that she had to break down her own door to find out.  I advised her that I would try to get one of our behavioral team members to come and see her, however she refused became agitated and uncooperative.  I am unable to clear her and discharge her home given a hallucinations that she may be having as well as being a danger to herself having destroyed her door and being brought in for ER evaluation by police.  I discussed the case with her son whose phone number she provided.  After speaking with him he is also concerned that she may be seeing things that may not be there and was agreeable to a psychiatric evaluation for her at this time.  Is not cooperative, requiring Geodon to assess her.   Final Clinical Impression(s) / ED Diagnoses Final diagnoses:  Hallucination  Paranoid behavior ([place])    Rx / DC Orders ED Discharge Orders    None  [place] Fuse, MD 01/25/21 [phone]

## 2021-01-25 NOTE — ED Notes (Signed)
This RN discussed need for labs with pt; pt continues to refuse and states her ins will not pay for the blood work; EDP made aware

## 2021-01-26 ENCOUNTER — Emergency Department (HOSPITAL_COMMUNITY): Payer: Medicare Other

## 2021-01-26 DIAGNOSIS — F14151 Cocaine abuse with cocaine-induced psychotic disorder with hallucinations: Secondary | ICD-10-CM | POA: Diagnosis not present

## 2021-01-26 DIAGNOSIS — F14959 Cocaine use, unspecified with cocaine-induced psychotic disorder, unspecified: Secondary | ICD-10-CM | POA: Insufficient documentation

## 2021-01-26 DIAGNOSIS — R410 Disorientation, unspecified: Secondary | ICD-10-CM | POA: Diagnosis not present

## 2021-01-26 DIAGNOSIS — R4182 Altered mental status, unspecified: Secondary | ICD-10-CM | POA: Diagnosis not present

## 2021-01-26 DIAGNOSIS — I517 Cardiomegaly: Secondary | ICD-10-CM | POA: Diagnosis not present

## 2021-01-26 DIAGNOSIS — I6523 Occlusion and stenosis of bilateral carotid arteries: Secondary | ICD-10-CM | POA: Diagnosis not present

## 2021-01-26 LAB — AMMONIA: Ammonia: 17 umol/L (ref 9–35)

## 2021-01-26 LAB — CBG MONITORING, ED
Glucose-Capillary: 130 mg/dL — ABNORMAL HIGH (ref 70–99)
Glucose-Capillary: 99 mg/dL (ref 70–99)

## 2021-01-26 NOTE — ED Provider Notes (Signed)
Patient's been cleared by behavioral health for discharge home.  Patient without any suicidal intentions at this point in time.  Discharge paperwork completed.  According to behavioral health main problem is cocaine substance abuse.  Although urine drug screen not back yet patient admitted to it.  Sounds like when the incident occurred patient was high on cocaine.   Fredia Sorrow, MD 01/26/21 [phone]

## 2021-01-26 NOTE — ED Notes (Signed)
In CT

## 2021-01-26 NOTE — Discharge Instructions (Addendum)
Cleared by behavioral health for discharge home.  Follow-up as per behavioral health.
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
## 2021-01-26 NOTE — ED Notes (Signed)
Pt VS updated, provider made aware, came to bedside to assess pt, no new orders at this time, requested urine from pt she says she cannot go at this time. Pt undressed and placed in hospital attire, pt and belongings wanded by security. Belongings placed in secured locker. Pt environment secured. Sitter remains at bedside, NAD noted.

## 2021-01-29 ENCOUNTER — Ambulatory Visit
Admission: RE | Admit: 2021-01-29 | Discharge: 2021-01-29 | Disposition: A | Discharge status: Home / Self Care | Payer: Medicare Other | Source: Ambulatory Visit | Attending: Neurosurgery | Admitting: Neurosurgery

## 2021-01-29 ENCOUNTER — Ambulatory Visit: Payer: Medicare Other | Admitting: Nurse Practitioner

## 2021-01-29 DIAGNOSIS — R410 Disorientation, unspecified: Secondary | ICD-10-CM | POA: Diagnosis not present

## 2021-01-29 DIAGNOSIS — R200 Anesthesia of skin: Secondary | ICD-10-CM | POA: Diagnosis not present

## 2021-01-29 DIAGNOSIS — I671 Cerebral aneurysm, nonruptured: Secondary | ICD-10-CM

## 2021-01-29 DIAGNOSIS — R262 Difficulty in walking, not elsewhere classified: Secondary | ICD-10-CM | POA: Diagnosis not present

## 2021-01-29 DIAGNOSIS — Q283 Other malformations of cerebral vessels: Secondary | ICD-10-CM | POA: Diagnosis not present

## 2021-01-30 ENCOUNTER — Other Ambulatory Visit: Payer: Self-pay | Admitting: "Endocrinology

## 2021-01-30 DIAGNOSIS — K7460 Unspecified cirrhosis of liver: Secondary | ICD-10-CM | POA: Diagnosis not present

## 2021-01-30 DIAGNOSIS — Z299 Encounter for prophylactic measures, unspecified: Secondary | ICD-10-CM | POA: Diagnosis not present

## 2021-01-30 DIAGNOSIS — Z794 Long term (current) use of insulin: Secondary | ICD-10-CM | POA: Diagnosis not present

## 2021-01-30 DIAGNOSIS — E1165 Type 2 diabetes mellitus with hyperglycemia: Secondary | ICD-10-CM | POA: Diagnosis not present

## 2021-01-30 DIAGNOSIS — E1142 Type 2 diabetes mellitus with diabetic polyneuropathy: Secondary | ICD-10-CM | POA: Diagnosis not present

## 2021-01-30 DIAGNOSIS — M545 Low back pain, unspecified: Secondary | ICD-10-CM | POA: Diagnosis not present

## 2021-02-12 ENCOUNTER — Telehealth (HOSPITAL_COMMUNITY): Payer: Self-pay | Admitting: Clinical

## 2021-02-12 ENCOUNTER — Other Ambulatory Visit: Payer: Self-pay

## 2021-02-12 ENCOUNTER — Ambulatory Visit (HOSPITAL_COMMUNITY): Payer: Medicare Other | Admitting: Clinical

## 2021-02-12 NOTE — Telephone Encounter (Signed)
The patient did not respond to video link, phone call or VM

## 2021-02-13 DIAGNOSIS — M5441 Lumbago with sciatica, right side: Secondary | ICD-10-CM | POA: Diagnosis not present

## 2021-02-13 DIAGNOSIS — M5442 Lumbago with sciatica, left side: Secondary | ICD-10-CM | POA: Diagnosis not present

## 2021-02-13 DIAGNOSIS — G8929 Other chronic pain: Secondary | ICD-10-CM | POA: Diagnosis not present

## 2021-02-14 DIAGNOSIS — G8929 Other chronic pain: Secondary | ICD-10-CM | POA: Diagnosis not present

## 2021-02-14 DIAGNOSIS — Z1231 Encounter for screening mammogram for malignant neoplasm of breast: Secondary | ICD-10-CM | POA: Diagnosis not present

## 2021-02-14 DIAGNOSIS — M5442 Lumbago with sciatica, left side: Secondary | ICD-10-CM | POA: Diagnosis not present

## 2021-02-14 DIAGNOSIS — M5441 Lumbago with sciatica, right side: Secondary | ICD-10-CM | POA: Diagnosis not present

## 2021-02-19 ENCOUNTER — Ambulatory Visit (INDEPENDENT_AMBULATORY_CARE_PROVIDER_SITE_OTHER): Payer: Medicare Other | Admitting: Podiatry

## 2021-02-19 DIAGNOSIS — Z5329 Procedure and treatment not carried out because of patient's decision for other reasons: Secondary | ICD-10-CM

## 2021-02-19 NOTE — Progress Notes (Signed)
No show for appt. 

## 2021-02-20 DIAGNOSIS — M5442 Lumbago with sciatica, left side: Secondary | ICD-10-CM | POA: Diagnosis not present

## 2021-02-20 DIAGNOSIS — M5441 Lumbago with sciatica, right side: Secondary | ICD-10-CM | POA: Diagnosis not present

## 2021-02-20 DIAGNOSIS — G8929 Other chronic pain: Secondary | ICD-10-CM | POA: Diagnosis not present

## 2021-02-21 ENCOUNTER — Other Ambulatory Visit (INDEPENDENT_AMBULATORY_CARE_PROVIDER_SITE_OTHER): Payer: Self-pay | Admitting: Internal Medicine

## 2021-02-22 ENCOUNTER — Other Ambulatory Visit (INDEPENDENT_AMBULATORY_CARE_PROVIDER_SITE_OTHER): Payer: Self-pay | Admitting: Internal Medicine

## 2021-02-22 MED ORDER — DICYCLOMINE HCL 20 MG PO TABS: 20.0000 mg | ORAL_TABLET | Freq: Three times a day (TID) | ORAL | 5 refills | Status: DC

## 2021-02-22 NOTE — Telephone Encounter (Signed)
Last seen 06/05/2020 for liver cirrhosis by Dr. Laural Golden.

## 2021-02-26 DIAGNOSIS — M5441 Lumbago with sciatica, right side: Secondary | ICD-10-CM | POA: Diagnosis not present

## 2021-02-26 DIAGNOSIS — Z299 Encounter for prophylactic measures, unspecified: Secondary | ICD-10-CM | POA: Diagnosis not present

## 2021-02-26 DIAGNOSIS — G8929 Other chronic pain: Secondary | ICD-10-CM | POA: Diagnosis not present

## 2021-02-26 DIAGNOSIS — M545 Low back pain, unspecified: Secondary | ICD-10-CM | POA: Diagnosis not present

## 2021-02-26 DIAGNOSIS — F419 Anxiety disorder, unspecified: Secondary | ICD-10-CM | POA: Diagnosis not present

## 2021-02-26 DIAGNOSIS — M5442 Lumbago with sciatica, left side: Secondary | ICD-10-CM | POA: Diagnosis not present

## 2021-02-27 ENCOUNTER — Ambulatory Visit: Payer: Medicare Other | Admitting: Nurse Practitioner

## 2021-03-05 ENCOUNTER — Other Ambulatory Visit: Payer: Self-pay | Admitting: Nurse Practitioner

## 2021-03-05 DIAGNOSIS — G8929 Other chronic pain: Secondary | ICD-10-CM | POA: Diagnosis not present

## 2021-03-05 DIAGNOSIS — M5442 Lumbago with sciatica, left side: Secondary | ICD-10-CM | POA: Diagnosis not present

## 2021-03-05 DIAGNOSIS — IMO0002 Reserved for concepts with insufficient information to code with codable children: Secondary | ICD-10-CM

## 2021-03-05 DIAGNOSIS — E1165 Type 2 diabetes mellitus with hyperglycemia: Secondary | ICD-10-CM

## 2021-03-05 DIAGNOSIS — M5441 Lumbago with sciatica, right side: Secondary | ICD-10-CM | POA: Diagnosis not present

## 2021-03-07 DIAGNOSIS — M5442 Lumbago with sciatica, left side: Secondary | ICD-10-CM | POA: Diagnosis not present

## 2021-03-07 DIAGNOSIS — M5441 Lumbago with sciatica, right side: Secondary | ICD-10-CM | POA: Diagnosis not present

## 2021-03-07 DIAGNOSIS — G8929 Other chronic pain: Secondary | ICD-10-CM | POA: Diagnosis not present

## 2021-03-13 ENCOUNTER — Ambulatory Visit: Payer: Medicare Other | Admitting: Nurse Practitioner

## 2021-03-14 DIAGNOSIS — M5441 Lumbago with sciatica, right side: Secondary | ICD-10-CM | POA: Diagnosis not present

## 2021-03-14 DIAGNOSIS — M5442 Lumbago with sciatica, left side: Secondary | ICD-10-CM | POA: Diagnosis not present

## 2021-03-14 DIAGNOSIS — G8929 Other chronic pain: Secondary | ICD-10-CM | POA: Diagnosis not present

## 2021-03-15 DIAGNOSIS — R29898 Other symptoms and signs involving the musculoskeletal system: Secondary | ICD-10-CM | POA: Diagnosis not present

## 2021-03-15 DIAGNOSIS — M2569 Stiffness of other specified joint, not elsewhere classified: Secondary | ICD-10-CM | POA: Diagnosis not present

## 2021-03-15 DIAGNOSIS — G8929 Other chronic pain: Secondary | ICD-10-CM | POA: Diagnosis not present

## 2021-03-15 DIAGNOSIS — M5441 Lumbago with sciatica, right side: Secondary | ICD-10-CM | POA: Diagnosis not present

## 2021-03-15 DIAGNOSIS — M79661 Pain in right lower leg: Secondary | ICD-10-CM | POA: Diagnosis not present

## 2021-03-15 DIAGNOSIS — M5442 Lumbago with sciatica, left side: Secondary | ICD-10-CM | POA: Diagnosis not present

## 2021-03-15 DIAGNOSIS — E119 Type 2 diabetes mellitus without complications: Secondary | ICD-10-CM | POA: Diagnosis not present

## 2021-03-20 DIAGNOSIS — I10 Essential (primary) hypertension: Secondary | ICD-10-CM | POA: Diagnosis not present

## 2021-03-20 DIAGNOSIS — F339 Major depressive disorder, recurrent, unspecified: Secondary | ICD-10-CM | POA: Diagnosis not present

## 2021-03-20 DIAGNOSIS — Z789 Other specified health status: Secondary | ICD-10-CM | POA: Diagnosis not present

## 2021-03-20 DIAGNOSIS — Z299 Encounter for prophylactic measures, unspecified: Secondary | ICD-10-CM | POA: Diagnosis not present

## 2021-03-20 DIAGNOSIS — J069 Acute upper respiratory infection, unspecified: Secondary | ICD-10-CM | POA: Diagnosis not present

## 2021-03-20 DIAGNOSIS — M545 Low back pain, unspecified: Secondary | ICD-10-CM | POA: Diagnosis not present

## 2021-03-26 ENCOUNTER — Other Ambulatory Visit: Payer: Self-pay | Admitting: Nurse Practitioner

## 2021-03-26 ENCOUNTER — Other Ambulatory Visit: Payer: Self-pay | Admitting: "Endocrinology

## 2021-03-26 DIAGNOSIS — E039 Hypothyroidism, unspecified: Secondary | ICD-10-CM

## 2021-03-29 DIAGNOSIS — E119 Type 2 diabetes mellitus without complications: Secondary | ICD-10-CM | POA: Diagnosis not present

## 2021-03-29 DIAGNOSIS — G8929 Other chronic pain: Secondary | ICD-10-CM | POA: Diagnosis not present

## 2021-03-29 DIAGNOSIS — M79661 Pain in right lower leg: Secondary | ICD-10-CM | POA: Diagnosis not present

## 2021-03-29 DIAGNOSIS — M2569 Stiffness of other specified joint, not elsewhere classified: Secondary | ICD-10-CM | POA: Diagnosis not present

## 2021-03-29 DIAGNOSIS — M5441 Lumbago with sciatica, right side: Secondary | ICD-10-CM | POA: Diagnosis not present

## 2021-03-29 DIAGNOSIS — M5442 Lumbago with sciatica, left side: Secondary | ICD-10-CM | POA: Diagnosis not present

## 2021-04-09 ENCOUNTER — Other Ambulatory Visit: Payer: Self-pay | Admitting: Nurse Practitioner

## 2021-04-09 DIAGNOSIS — Z789 Other specified health status: Secondary | ICD-10-CM | POA: Diagnosis not present

## 2021-04-09 DIAGNOSIS — IMO0002 Reserved for concepts with insufficient information to code with codable children: Secondary | ICD-10-CM

## 2021-04-09 DIAGNOSIS — M5441 Lumbago with sciatica, right side: Secondary | ICD-10-CM | POA: Diagnosis not present

## 2021-04-09 DIAGNOSIS — Z299 Encounter for prophylactic measures, unspecified: Secondary | ICD-10-CM | POA: Diagnosis not present

## 2021-04-09 DIAGNOSIS — E1165 Type 2 diabetes mellitus with hyperglycemia: Secondary | ICD-10-CM | POA: Diagnosis not present

## 2021-04-09 DIAGNOSIS — M4807 Spinal stenosis, lumbosacral region: Secondary | ICD-10-CM | POA: Diagnosis not present

## 2021-04-09 DIAGNOSIS — M5442 Lumbago with sciatica, left side: Secondary | ICD-10-CM | POA: Diagnosis not present

## 2021-04-09 DIAGNOSIS — F419 Anxiety disorder, unspecified: Secondary | ICD-10-CM | POA: Diagnosis not present

## 2021-04-09 DIAGNOSIS — E119 Type 2 diabetes mellitus without complications: Secondary | ICD-10-CM | POA: Diagnosis not present

## 2021-04-09 DIAGNOSIS — M2569 Stiffness of other specified joint, not elsewhere classified: Secondary | ICD-10-CM | POA: Diagnosis not present

## 2021-04-09 DIAGNOSIS — M79661 Pain in right lower leg: Secondary | ICD-10-CM | POA: Diagnosis not present

## 2021-04-09 DIAGNOSIS — I10 Essential (primary) hypertension: Secondary | ICD-10-CM | POA: Diagnosis not present

## 2021-04-09 DIAGNOSIS — G8929 Other chronic pain: Secondary | ICD-10-CM | POA: Diagnosis not present

## 2021-04-23 DIAGNOSIS — Z6831 Body mass index (BMI) 31.0-31.9, adult: Secondary | ICD-10-CM | POA: Diagnosis not present

## 2021-04-23 DIAGNOSIS — Z789 Other specified health status: Secondary | ICD-10-CM | POA: Diagnosis not present

## 2021-04-23 DIAGNOSIS — I10 Essential (primary) hypertension: Secondary | ICD-10-CM | POA: Diagnosis not present

## 2021-04-23 DIAGNOSIS — R0781 Pleurodynia: Secondary | ICD-10-CM | POA: Diagnosis not present

## 2021-04-23 DIAGNOSIS — Z299 Encounter for prophylactic measures, unspecified: Secondary | ICD-10-CM | POA: Diagnosis not present

## 2021-04-23 DIAGNOSIS — M5442 Lumbago with sciatica, left side: Secondary | ICD-10-CM | POA: Diagnosis not present

## 2021-04-23 DIAGNOSIS — E1165 Type 2 diabetes mellitus with hyperglycemia: Secondary | ICD-10-CM | POA: Diagnosis not present

## 2021-04-25 DIAGNOSIS — R0781 Pleurodynia: Secondary | ICD-10-CM | POA: Diagnosis not present

## 2021-04-25 DIAGNOSIS — Z9181 History of falling: Secondary | ICD-10-CM | POA: Diagnosis not present

## 2021-04-25 DIAGNOSIS — Z043 Encounter for examination and observation following other accident: Secondary | ICD-10-CM | POA: Diagnosis not present

## 2021-04-30 DIAGNOSIS — M5442 Lumbago with sciatica, left side: Secondary | ICD-10-CM | POA: Diagnosis not present

## 2021-04-30 DIAGNOSIS — M545 Low back pain, unspecified: Secondary | ICD-10-CM | POA: Diagnosis not present

## 2021-05-03 DIAGNOSIS — M4316 Spondylolisthesis, lumbar region: Secondary | ICD-10-CM | POA: Diagnosis not present

## 2021-05-03 DIAGNOSIS — M48062 Spinal stenosis, lumbar region with neurogenic claudication: Secondary | ICD-10-CM | POA: Diagnosis not present

## 2021-05-03 DIAGNOSIS — Z6831 Body mass index (BMI) 31.0-31.9, adult: Secondary | ICD-10-CM | POA: Diagnosis not present

## 2021-05-06 DIAGNOSIS — M48062 Spinal stenosis, lumbar region with neurogenic claudication: Secondary | ICD-10-CM | POA: Diagnosis not present

## 2021-05-14 DIAGNOSIS — I10 Essential (primary) hypertension: Secondary | ICD-10-CM | POA: Diagnosis not present

## 2021-05-14 DIAGNOSIS — Z299 Encounter for prophylactic measures, unspecified: Secondary | ICD-10-CM | POA: Diagnosis not present

## 2021-05-14 DIAGNOSIS — H9201 Otalgia, right ear: Secondary | ICD-10-CM | POA: Diagnosis not present

## 2021-05-14 DIAGNOSIS — J3489 Other specified disorders of nose and nasal sinuses: Secondary | ICD-10-CM | POA: Diagnosis not present

## 2021-05-14 DIAGNOSIS — K7460 Unspecified cirrhosis of liver: Secondary | ICD-10-CM | POA: Diagnosis not present

## 2021-05-27 DIAGNOSIS — M48062 Spinal stenosis, lumbar region with neurogenic claudication: Secondary | ICD-10-CM | POA: Diagnosis not present

## 2021-05-29 ENCOUNTER — Other Ambulatory Visit: Payer: Self-pay | Admitting: Neurosurgery

## 2021-05-30 ENCOUNTER — Other Ambulatory Visit: Payer: Self-pay | Admitting: Neurosurgery

## 2021-05-31 DIAGNOSIS — Z299 Encounter for prophylactic measures, unspecified: Secondary | ICD-10-CM | POA: Diagnosis not present

## 2021-05-31 DIAGNOSIS — Z79899 Other long term (current) drug therapy: Secondary | ICD-10-CM | POA: Diagnosis not present

## 2021-05-31 DIAGNOSIS — Z1339 Encounter for screening examination for other mental health and behavioral disorders: Secondary | ICD-10-CM | POA: Diagnosis not present

## 2021-05-31 DIAGNOSIS — Z0000 Encounter for general adult medical examination without abnormal findings: Secondary | ICD-10-CM | POA: Diagnosis not present

## 2021-05-31 DIAGNOSIS — E039 Hypothyroidism, unspecified: Secondary | ICD-10-CM | POA: Diagnosis not present

## 2021-05-31 DIAGNOSIS — Z7189 Other specified counseling: Secondary | ICD-10-CM | POA: Diagnosis not present

## 2021-05-31 DIAGNOSIS — F419 Anxiety disorder, unspecified: Secondary | ICD-10-CM | POA: Diagnosis not present

## 2021-05-31 DIAGNOSIS — R5383 Other fatigue: Secondary | ICD-10-CM | POA: Diagnosis not present

## 2021-05-31 DIAGNOSIS — Z1331 Encounter for screening for depression: Secondary | ICD-10-CM | POA: Diagnosis not present

## 2021-05-31 DIAGNOSIS — E1165 Type 2 diabetes mellitus with hyperglycemia: Secondary | ICD-10-CM | POA: Diagnosis not present

## 2021-05-31 DIAGNOSIS — H6691 Otitis media, unspecified, right ear: Secondary | ICD-10-CM | POA: Diagnosis not present

## 2021-05-31 DIAGNOSIS — E7800 Pure hypercholesterolemia, unspecified: Secondary | ICD-10-CM | POA: Diagnosis not present

## 2021-06-21 ENCOUNTER — Ambulatory Visit: Payer: Medicare Other | Admitting: Cardiology

## 2021-06-24 NOTE — Progress Notes (Signed)
Surgical Instructions    Your procedure is scheduled on Thursday, September 29th, 2022.   Report to [facility] Main Entrance "A" at 05:30 A.M., then check in with the Admitting office.  Call this number if you have problems the morning of surgery:  [phone]   If you have any questions prior to your surgery date call [phone]: Open Monday-Friday 8am-4pm    Remember:  Do not eat or drink after midnight the night before your surgery    Take these medicines the morning of surgery with A SIP OF WATER:  atorvastatin (LIPITOR) baclofen (LIORESAL) dicyclomine (BENTYL) DULoxetine (CYMBALTA) fluconazole (DIFLUCAN)  fluticasone (FLONASE) levothyroxine (SYNTHROID) metoprolol tartrate (LOPRESSOR)  pantoprazole (PROTONIX)  PARoxetine (PAXIL)   If needed:  acyclovir (ZOVIRAX) albuterol (PROVENTIL) diazepam (VALIUM) HYDROcodone-acetaminophen (NORCO/VICODIN) ondansetron (ZOFRAN)  Follow your surgeon's instructions on when to stop Aspirin.  If no instructions were given by your surgeon then you will need to call the office to get those instructions.     As of today, STOP taking any Aspirin (unless otherwise instructed by your surgeon) Aleve, Naproxen, Ibuprofen, Motrin, Advil, Goody's, BC's, all herbal medications, fish oil, and all vitamins.   WHAT DO I DO ABOUT MY DIABETES MEDICATION?        Do not take metFORMIN (GLUCOPHAGE) the day of surgery  Do not take the bedtime dose of insulin aspart (NOVOLOG) the day before surgery  THE MORNING OF SURGERY, do not take insulin aspart (NOVOLOG).  The night before surgery, take 50% ( 17 units) of Insulin Degludec (TRESIBA).  If your CBG is greater than 220 mg/dL, you may take  of your sliding scale (correction) dose of insulin.   HOW TO MANAGE YOUR DIABETES BEFORE AND AFTER SURGERY  Why is it important to control my blood sugar before and after surgery? Improving blood sugar levels before and after surgery helps healing and  can limit problems. A way of improving blood sugar control is eating a healthy diet by:  Eating less sugar and carbohydrates  Increasing activity/exercise  Talking with your doctor about reaching your blood sugar goals High blood sugars (greater than 180 mg/dL) can raise your risk of infections and slow your recovery, so you will need to focus on controlling your diabetes during the weeks before surgery. Make sure that the doctor who takes care of your diabetes knows about your planned surgery including the date and location.  How do I manage my blood sugar before surgery? Check your blood sugar at least 4 times a day, starting 2 days before surgery, to make sure that the level is not too high or low.  Check your blood sugar the morning of your surgery when you wake up and every 2 hours until you get to the Short Stay unit.  If your blood sugar is less than 70 mg/dL, you will need to treat for low blood sugar: Do not take insulin. Treat a low blood sugar (less than 70 mg/dL) with  cup of clear juice (cranberry or apple), 4 glucose tablets, OR glucose gel. Recheck blood sugar in 15 minutes after treatment (to make sure it is greater than 70 mg/dL). If your blood sugar is not greater than 70 mg/dL on recheck, call [phone] for further instructions. Report your blood sugar to the short stay nurse when you get to Short Stay.  If you are admitted to the hospital after surgery: Your blood sugar will be checked by the staff and you will probably be given insulin after surgery (instead  of oral diabetes medicines) to make sure you have good blood sugar levels. The goal for blood sugar control after surgery is 80-180 mg/dL.            Do not wear jewelry or makeup Do not wear lotions, powders, perfumes, or deodorant. Do not shave 48 hours prior to surgery.   Do not bring valuables to the hospital. DO Not wear nail polish, gel polish, artificial nails, or any other type of covering on natural  nails including finger and toenails. If patients have artificial nails, gel coating, etc. that need to be removed by a nail salon please have this removed prior to surgery or surgery may need to be canceled/delayed if the surgeon/ anesthesia feels like the patient is unable to be adequately monitored.              [facility] is not responsible for any belongings or valuables.  Do NOT Smoke (Tobacco/Vaping)  24 hours prior to your procedure If you use a CPAP at night, you may bring your mask for your overnight stay.   Contacts, glasses, dentures or bridgework may not be worn into surgery, please bring cases for these belongings   For patients admitted to the hospital, discharge time will be determined by your treatment team.   Patients discharged the day of surgery will not be allowed to drive home, and someone needs to stay with them for 24 hours.  NO VISITORS WILL BE ALLOWED IN PRE-OP WHERE PATIENTS GET READY FOR SURGERY.  ONLY 1 SUPPORT PERSON MAY BE PRESENT WHILE YOU ARE IN SURGERY.  IF YOU ARE TO BE ADMITTED, ONCE YOU ARE IN YOUR ROOM YOU WILL BE ALLOWED TWO (2) VISITORS.  Minor children may have two parents present. Special consideration for safety and communication needs will be reviewed on a case by case basis.  Special instructions:    Oral Hygiene is also important to reduce your risk of infection.  Remember - BRUSH YOUR TEETH THE MORNING OF SURGERY WITH YOUR REGULAR TOOTHPASTE   [facility]- Preparing For Surgery  Before surgery, you can play an important role. Because skin is not sterile, your skin needs to be as free of germs as possible. You can reduce the number of germs on your skin by washing with CHG (chlorahexidine gluconate) Soap before surgery.  CHG is an antiseptic cleaner which kills germs and bonds with the skin to continue killing germs even after washing.     Please do not use if you have an allergy to CHG or antibacterial soaps. If your skin becomes  reddened/irritated stop using the CHG.  Do not shave (including legs and underarms) for at least 48 hours prior to first CHG shower. It is OK to shave your face.  Please follow these instructions carefully.     Shower the NIGHT BEFORE SURGERY and the MORNING OF SURGERY with CHG Soap.   If you chose to wash your hair, wash your hair first as usual with your normal shampoo. After you shampoo, rinse your hair and body thoroughly to remove the shampoo.  Then [employer] and genitals (private parts) with your normal soap and rinse thoroughly to remove soap.  After that Use CHG Soap as you would any other liquid soap. You can apply CHG directly to the skin and wash gently with a scrungie or a clean washcloth.   Apply the CHG Soap to your body ONLY FROM THE NECK DOWN.  Do not use on open wounds or open  sores. Avoid contact with your eyes, ears, mouth and genitals (private parts). Wash Face and genitals (private parts)  with your normal soap.   Wash thoroughly, paying special attention to the area where your surgery will be performed.  Thoroughly rinse your body with warm water from the neck down.  DO NOT shower/wash with your normal soap after using and rinsing off the CHG Soap.  Pat yourself dry with a CLEAN TOWEL.  Wear CLEAN PAJAMAS to bed the night before surgery  Place CLEAN SHEETS on your bed the night before your surgery  DO NOT SLEEP WITH PETS.   Day of Surgery:  Take a shower with CHG soap. Wear Clean/Comfortable clothing the morning of surgery Do not apply any deodorants/lotions.   Remember to brush your teeth WITH YOUR REGULAR TOOTHPASTE.   Please read over the following fact sheets that you were given.

## 2021-06-25 ENCOUNTER — Inpatient Hospital Stay (HOSPITAL_COMMUNITY)
Admission: RE | Admit: 2021-06-25 | Discharge: 2021-06-25 | Disposition: A | Discharge status: Home / Self Care | Payer: Medicare Other | Source: Ambulatory Visit

## 2021-06-25 NOTE — Progress Notes (Signed)
Patient didn't come for PAT appointment today @ 13:00 o'clock. This Probation officer called the patient multiple times and left a message; also I called her son, Brin Ruggerio, but he didn't answer. Dr. Arnoldo Morale office was called and notified Lexine Baton). The surgical scheduler was informed, too. She will reschedule this appointment.

## 2021-06-28 ENCOUNTER — Ambulatory Visit: Payer: Medicare Other | Admitting: Cardiology

## 2021-07-01 ENCOUNTER — Encounter (HOSPITAL_COMMUNITY)
Admission: RE | Admit: 2021-07-01 | Discharge: 2021-07-01 | Disposition: A | Discharge status: Home / Self Care | Payer: Medicare Other | Source: Ambulatory Visit | Attending: Neurosurgery | Admitting: Neurosurgery

## 2021-07-01 ENCOUNTER — Other Ambulatory Visit: Payer: Self-pay

## 2021-07-01 ENCOUNTER — Encounter (HOSPITAL_COMMUNITY): Payer: Self-pay

## 2021-07-01 DIAGNOSIS — Z7982 Long term (current) use of aspirin: Secondary | ICD-10-CM | POA: Insufficient documentation

## 2021-07-01 DIAGNOSIS — Z01812 Encounter for preprocedural laboratory examination: Secondary | ICD-10-CM | POA: Insufficient documentation

## 2021-07-01 DIAGNOSIS — Z20822 Contact with and (suspected) exposure to covid-19: Secondary | ICD-10-CM | POA: Insufficient documentation

## 2021-07-01 DIAGNOSIS — Z8249 Family history of ischemic heart disease and other diseases of the circulatory system: Secondary | ICD-10-CM | POA: Insufficient documentation

## 2021-07-01 DIAGNOSIS — I2510 Atherosclerotic heart disease of native coronary artery without angina pectoris: Secondary | ICD-10-CM | POA: Insufficient documentation

## 2021-07-01 DIAGNOSIS — I082 Rheumatic disorders of both aortic and tricuspid valves: Secondary | ICD-10-CM | POA: Insufficient documentation

## 2021-07-01 DIAGNOSIS — I10 Essential (primary) hypertension: Secondary | ICD-10-CM | POA: Insufficient documentation

## 2021-07-01 DIAGNOSIS — E785 Hyperlipidemia, unspecified: Secondary | ICD-10-CM | POA: Insufficient documentation

## 2021-07-01 DIAGNOSIS — E119 Type 2 diabetes mellitus without complications: Secondary | ICD-10-CM | POA: Insufficient documentation

## 2021-07-01 DIAGNOSIS — I2584 Coronary atherosclerosis due to calcified coronary lesion: Secondary | ICD-10-CM | POA: Insufficient documentation

## 2021-07-01 HISTORY — DX: Pneumonia, unspecified organism: J18.9

## 2021-07-01 HISTORY — DX: Hypothyroidism, unspecified: E03.9

## 2021-07-01 HISTORY — DX: Unspecified osteoarthritis, unspecified site: M19.90

## 2021-07-01 HISTORY — DX: Anemia, unspecified: D64.9

## 2021-07-01 LAB — BASIC METABOLIC PANEL
Anion gap: 7 (ref 5–15)
BUN: 19 mg/dL (ref 6–20)
CO2: 25 mmol/L (ref 22–32)
Calcium: 8.9 mg/dL (ref 8.9–10.3)
Chloride: 108 mmol/L (ref 98–111)
Creatinine, Ser: 1.28 mg/dL — ABNORMAL HIGH (ref 0.44–1.00)
GFR, Estimated: 48 mL/min — ABNORMAL LOW (ref >60)
Glucose, Bld: 95 mg/dL (ref 70–99)
Potassium: 3.7 mmol/L (ref 3.5–5.1)
Sodium: 140 mmol/L (ref 135–145)

## 2021-07-01 LAB — TYPE AND SCREEN
ABO/RH(D): A NEG
Antibody Screen: NEGATIVE

## 2021-07-01 LAB — SURGICAL PCR SCREEN
MRSA, PCR: NEGATIVE
Staphylococcus aureus: NEGATIVE

## 2021-07-01 LAB — HEMOGLOBIN A1C
Hgb A1c MFr Bld: 7.6 % — ABNORMAL HIGH (ref 4.8–5.6)
Mean Plasma Glucose: 171.42 mg/dL

## 2021-07-01 LAB — CBC
HCT: 39.9 % (ref 36.0–46.0)
Hemoglobin: 12.8 g/dL (ref 12.0–15.0)
MCH: 30.9 pg (ref 26.0–34.0)
MCHC: 32.1 g/dL (ref 30.0–36.0)
MCV: 96.4 fL (ref 80.0–100.0)
Platelets: 268 10*3/uL (ref 150–400)
RBC: 4.14 MIL/uL (ref 3.87–5.11)
RDW: 14.4 % (ref 11.5–15.5)
WBC: 11.9 10*3/uL — ABNORMAL HIGH (ref 4.0–10.5)
nRBC: 0 % (ref 0.0–0.2)

## 2021-07-01 LAB — GLUCOSE, CAPILLARY: Glucose-Capillary: 109 mg/dL — ABNORMAL HIGH (ref 70–99)

## 2021-07-01 LAB — SARS CORONAVIRUS 2 (TAT 6-24 HRS): SARS Coronavirus 2: NEGATIVE

## 2021-07-01 NOTE — Progress Notes (Signed)
PCP - [name] Cardiologist - Dr. Terri Skains Per patient, sees Dr. Terri Skains every 6 months for follow up for elevated resting HR.  Last office visit 12/18/20. Per patient, not sure if Dr. Terri Skains is aware of upcoming surgery.   Chest x-ray - n/a EKG - 01/25/21 ECHO - 04/23/20  DM - type 2 Fasting Blood Sugar - 80-100 Patient wears a glucose monitor on right arm.  Can be taken off on DOS, if needed.  Aspirin Instructions: last dose 06/29/21   COVID TEST- 07/01/21 (surgery admit)   Anesthesia review: yes, heart history, see above note  Patient denies shortness of breath, fever, cough and chest pain at PAT appointment   All instructions explained to the patient, with a verbal understanding of the material. Patient agrees to go over the instructions while at home for a better understanding. Patient also instructed to self quarantine after being tested for COVID-19. The opportunity to ask questions was provided.

## 2021-07-02 NOTE — Anesthesia Preprocedure Evaluation (Addendum)
Anesthesia Evaluation  Patient identified by MRN, date of birth, ID band Patient awake    Reviewed: Allergy & Precautions, NPO status , Patient's Chart, lab work & pertinent test results  Airway Mallampati: II  TM Distance: <3 FB Neck ROM: Full    Dental  (+) Teeth Intact, Dental Advisory Given   Pulmonary    breath sounds clear to auscultation       Cardiovascular hypertension, Pt. on medications and Pt. on home beta blockers  Rhythm:Regular Rate:Normal     Neuro/Psych PSYCHIATRIC DISORDERS Anxiety Depression  Neuromuscular disease    GI/Hepatic GERD  Medicated,(+) Cirrhosis       , Hepatitis -  Endo/Other  diabetes, Type 2, Insulin Dependent, Oral Hypoglycemic AgentsHypothyroidism   Renal/GU      Musculoskeletal  (+) Arthritis ,   Abdominal Normal abdominal exam  (+)   Peds  Hematology   Anesthesia Other Findings   Reproductive/Obstetrics                           Anesthesia Physical Anesthesia Plan  ASA: 3  Anesthesia Plan: General   Post-op Pain Management:    Induction:   PONV Risk Score and Plan: 4 or greater and Ondansetron, Dexamethasone, Midazolam and Scopolamine patch - Pre-op  Airway Management Planned: Oral ETT  Additional Equipment: None  Intra-op Plan:   Post-operative Plan: Extubation in OR  Informed Consent: I have reviewed the patients History and Physical, chart, labs and discussed the procedure including the risks, benefits and alternatives for the proposed anesthesia with the patient or authorized representative who has indicated his/her understanding and acceptance.     Dental advisory given  Plan Discussed with: CRNA  Anesthesia Plan Comments: (PAT note by Karoline Caldwell, PA-C: Follows with cardiology for history of HTN, HLD, coronary artery calcification, family history of premature CAD.  She is recently evaluated for DOE.  Recent coronary CTA showed  moderate coronary artery calcification without obstructive epicardial coronary artery disease.  Last seen by Dr. Terri Skains 12/18/2020.  Cardiac clearance per Dr. Terri Skains states patient is low risk for surgery and may stop ASA 7 days prior.  Copy on chart.  Preop labs reviewed, creatinine mildly elevated 1.28, otherwise unremarkable.  DM2 suboptimally controlled with A1c 7.6.  EKG 12/18/2020: Normal sinus rhythm, 65 bpm, without underlying ischemia or injury pattern.  Echocardiogram: 04/23/2020 performed at [facility] internal medicine: Per report patient's LVEF is 55 to 60%, Doppler evidence of left ventricular diastolic dysfunction, aortic valve sclerosis without stenosis, trace MR, mild TR, RVSP 19 mmHg.  Coronary CTA 06/2020: Coronary calcium score of 105. This was 57th percentile for age and sex matched control. Normal coronary origin with right dominance. CADRADS = 3. Calcified plaque noted at the level of the trifurcation giving rise to moderate stenosis at the level of the ostial LAD and ostial ramus. Severity of the ledsion may be overestimated due to blooming artifact. Therefore the study will be sent to CT FFR for functional assessment. Aortic atherosclerosis. CT FFR analysis showed no significant stenosis. )      Anesthesia Quick Evaluation

## 2021-07-02 NOTE — Progress Notes (Addendum)
Anesthesia Chart Review:  Follows with cardiology for history of HTN, HLD, coronary artery calcification, family history of premature CAD.  She was recently evaluated for DOE.  Recent coronary CTA showed moderate coronary artery calcification without obstructive epicardial coronary artery disease.  Last seen by Dr. Terri Skains 12/18/2020.  Cardiac clearance per Dr. Terri Skains states patient is low risk for surgery and may stop ASA 7 days prior.  Copy on chart.  Preop labs reviewed, creatinine mildly elevated 1.28, otherwise unremarkable.  DM2 suboptimally controlled with A1c 7.6.  EKG 12/18/2020: Normal sinus rhythm, 65 bpm, without underlying ischemia or injury pattern.   Echocardiogram: 04/23/2020 performed at [facility] internal medicine: Per report patient's LVEF is 55 to 60%, Doppler evidence of left ventricular diastolic dysfunction, aortic valve sclerosis without stenosis, trace MR, mild TR, RVSP 19 mmHg.   Coronary CTA 06/2020: Coronary calcium score of 105. This was 44th percentile for age and sex matched control. Normal coronary origin with right dominance. CADRADS = 3. Calcified plaque noted at the level of the trifurcation giving rise to moderate stenosis at the level of the ostial LAD and ostial ramus. Severity of the ledsion may be overestimated due to blooming artifact. Therefore the study will be sent to CT FFR for functional assessment. Aortic atherosclerosis. CT FFR analysis showed no significant stenosis.   Wynonia Musty [facility] Short Stay Center/Anesthesiology Phone [phone] 07/02/2021 3:34 PM

## 2021-07-04 ENCOUNTER — Encounter (HOSPITAL_COMMUNITY): Payer: Self-pay | Admitting: Neurosurgery

## 2021-07-04 ENCOUNTER — Inpatient Hospital Stay (HOSPITAL_COMMUNITY)
Admission: RE | Admit: 2021-07-04 | Discharge: 2021-07-06 | DRG: 455 | Disposition: A | Discharge status: Home Health | Payer: Medicare Other | Attending: Neurosurgery | Admitting: Neurosurgery

## 2021-07-04 ENCOUNTER — Inpatient Hospital Stay (HOSPITAL_COMMUNITY): Payer: Medicare Other

## 2021-07-04 ENCOUNTER — Inpatient Hospital Stay (HOSPITAL_COMMUNITY): Payer: Medicare Other | Admitting: Vascular Surgery

## 2021-07-04 ENCOUNTER — Encounter (HOSPITAL_COMMUNITY)
Admission: RE | Disposition: A | Discharge status: Home / Self Care | Payer: Self-pay | Source: Home / Self Care | Attending: Neurosurgery

## 2021-07-04 ENCOUNTER — Other Ambulatory Visit: Payer: Self-pay

## 2021-07-04 DIAGNOSIS — M5116 Intervertebral disc disorders with radiculopathy, lumbar region: Secondary | ICD-10-CM | POA: Diagnosis present

## 2021-07-04 DIAGNOSIS — Z20822 Contact with and (suspected) exposure to covid-19: Secondary | ICD-10-CM | POA: Diagnosis present

## 2021-07-04 DIAGNOSIS — M48061 Spinal stenosis, lumbar region without neurogenic claudication: Secondary | ICD-10-CM | POA: Diagnosis present

## 2021-07-04 DIAGNOSIS — E039 Hypothyroidism, unspecified: Secondary | ICD-10-CM | POA: Diagnosis present

## 2021-07-04 DIAGNOSIS — Z419 Encounter for procedure for purposes other than remedying health state, unspecified: Secondary | ICD-10-CM

## 2021-07-04 DIAGNOSIS — Z800 Family history of malignant neoplasm of digestive organs: Secondary | ICD-10-CM | POA: Diagnosis not present

## 2021-07-04 DIAGNOSIS — G8929 Other chronic pain: Secondary | ICD-10-CM | POA: Diagnosis present

## 2021-07-04 DIAGNOSIS — Z888 Allergy status to other drugs, medicaments and biological substances status: Secondary | ICD-10-CM | POA: Diagnosis not present

## 2021-07-04 DIAGNOSIS — F32A Depression, unspecified: Secondary | ICD-10-CM | POA: Diagnosis present

## 2021-07-04 DIAGNOSIS — K7460 Unspecified cirrhosis of liver: Secondary | ICD-10-CM | POA: Diagnosis present

## 2021-07-04 DIAGNOSIS — Z794 Long term (current) use of insulin: Secondary | ICD-10-CM

## 2021-07-04 DIAGNOSIS — Z79899 Other long term (current) drug therapy: Secondary | ICD-10-CM

## 2021-07-04 DIAGNOSIS — Z01818 Encounter for other preprocedural examination: Secondary | ICD-10-CM | POA: Diagnosis not present

## 2021-07-04 DIAGNOSIS — Z886 Allergy status to analgesic agent status: Secondary | ICD-10-CM | POA: Diagnosis not present

## 2021-07-04 DIAGNOSIS — Z7982 Long term (current) use of aspirin: Secondary | ICD-10-CM | POA: Diagnosis not present

## 2021-07-04 DIAGNOSIS — I10 Essential (primary) hypertension: Secondary | ICD-10-CM | POA: Diagnosis present

## 2021-07-04 DIAGNOSIS — K219 Gastro-esophageal reflux disease without esophagitis: Secondary | ICD-10-CM | POA: Diagnosis present

## 2021-07-04 DIAGNOSIS — Z79890 Hormone replacement therapy (postmenopausal): Secondary | ICD-10-CM | POA: Diagnosis not present

## 2021-07-04 DIAGNOSIS — E1140 Type 2 diabetes mellitus with diabetic neuropathy, unspecified: Secondary | ICD-10-CM | POA: Diagnosis present

## 2021-07-04 DIAGNOSIS — M48062 Spinal stenosis, lumbar region with neurogenic claudication: Secondary | ICD-10-CM | POA: Diagnosis not present

## 2021-07-04 DIAGNOSIS — E669 Obesity, unspecified: Secondary | ICD-10-CM | POA: Diagnosis present

## 2021-07-04 DIAGNOSIS — F419 Anxiety disorder, unspecified: Secondary | ICD-10-CM | POA: Diagnosis present

## 2021-07-04 DIAGNOSIS — Z6831 Body mass index (BMI) 31.0-31.9, adult: Secondary | ICD-10-CM | POA: Diagnosis not present

## 2021-07-04 DIAGNOSIS — H8109 Meniere's disease, unspecified ear: Secondary | ICD-10-CM | POA: Diagnosis present

## 2021-07-04 DIAGNOSIS — K589 Irritable bowel syndrome without diarrhea: Secondary | ICD-10-CM | POA: Diagnosis present

## 2021-07-04 DIAGNOSIS — M4726 Other spondylosis with radiculopathy, lumbar region: Secondary | ICD-10-CM | POA: Diagnosis not present

## 2021-07-04 DIAGNOSIS — E7800 Pure hypercholesterolemia, unspecified: Secondary | ICD-10-CM | POA: Diagnosis present

## 2021-07-04 DIAGNOSIS — Z8249 Family history of ischemic heart disease and other diseases of the circulatory system: Secondary | ICD-10-CM

## 2021-07-04 DIAGNOSIS — M4316 Spondylolisthesis, lumbar region: Principal | ICD-10-CM | POA: Diagnosis present

## 2021-07-04 DIAGNOSIS — Z981 Arthrodesis status: Secondary | ICD-10-CM | POA: Diagnosis not present

## 2021-07-04 DIAGNOSIS — M4326 Fusion of spine, lumbar region: Secondary | ICD-10-CM | POA: Diagnosis not present

## 2021-07-04 DIAGNOSIS — M1990 Unspecified osteoarthritis, unspecified site: Secondary | ICD-10-CM | POA: Diagnosis present

## 2021-07-04 LAB — GLUCOSE, CAPILLARY
Glucose-Capillary: 110 mg/dL — ABNORMAL HIGH (ref 70–99)
Glucose-Capillary: 93 mg/dL (ref 70–99)
Glucose-Capillary: 97 mg/dL (ref 70–99)
Glucose-Capillary: 122 mg/dL — ABNORMAL HIGH (ref 70–99)

## 2021-07-04 SURGERY — POSTERIOR LUMBAR FUSION 1 LEVEL
Anesthesia: General

## 2021-07-04 MED ORDER — BACLOFEN 10 MG PO TABS
10.0000 mg | ORAL_TABLET | Freq: Two times a day (BID) | ORAL | Status: DC | PRN
  Administered 2021-07-05 – 2021-07-06 (×2): 10 mg via ORAL
  Filled 2021-07-04 (×2): qty 1

## 2021-07-04 MED ORDER — ACETAMINOPHEN 650 MG RE SUPP: 650.0000 mg | RECTAL | Status: DC | PRN

## 2021-07-04 MED ORDER — HYDROMORPHONE HCL 1 MG/ML IJ SOLN
INTRAMUSCULAR | Status: AC
  Filled 2021-07-04: qty 1

## 2021-07-04 MED ORDER — LISINOPRIL 20 MG PO TABS
20.0000 mg | ORAL_TABLET | Freq: Every day | ORAL | Status: DC
  Administered 2021-07-06: 20 mg via ORAL
  Filled 2021-07-04 (×2): qty 1

## 2021-07-04 MED ORDER — INSULIN ASPART 100 UNIT/ML IJ SOLN
0.0000 [IU] | Freq: Three times a day (TID) | INTRAMUSCULAR | Status: DC
  Administered 2021-07-05: 7 [IU] via SUBCUTANEOUS
  Administered 2021-07-05 (×2): 4 [IU] via SUBCUTANEOUS
  Administered 2021-07-06: 3 [IU] via SUBCUTANEOUS

## 2021-07-04 MED ORDER — ONDANSETRON HCL 4 MG PO TABS: 4.0000 mg | ORAL_TABLET | Freq: Three times a day (TID) | ORAL | Status: DC | PRN

## 2021-07-04 MED ORDER — ACYCLOVIR 400 MG PO TABS
400.0000 mg | ORAL_TABLET | Freq: Every day | ORAL | Status: DC
  Administered 2021-07-05: 400 mg via ORAL
  Filled 2021-07-04: qty 1

## 2021-07-04 MED ORDER — KETOTIFEN FUMARATE 0.025 % OP SOLN
1.0000 [drp] | Freq: Two times a day (BID) | OPHTHALMIC | Status: DC
  Administered 2021-07-04 – 2021-07-06 (×4): 1 [drp] via OPHTHALMIC
  Filled 2021-07-04: qty 5

## 2021-07-04 MED ORDER — CEFAZOLIN SODIUM-DEXTROSE 2-4 GM/100ML-% IV SOLN
INTRAVENOUS | Status: AC
  Filled 2021-07-04: qty 100

## 2021-07-04 MED ORDER — PROPOFOL 10 MG/ML IV BOLUS
INTRAVENOUS | Status: DC | PRN
  Administered 2021-07-04: 150 mg via INTRAVENOUS

## 2021-07-04 MED ORDER — METFORMIN HCL 500 MG PO TABS
500.0000 mg | ORAL_TABLET | Freq: Two times a day (BID) | ORAL | Status: DC
  Administered 2021-07-05 – 2021-07-06 (×3): 500 mg via ORAL
  Filled 2021-07-04 (×3): qty 1

## 2021-07-04 MED ORDER — PHENYLEPHRINE HCL (PRESSORS) 10 MG/ML IV SOLN
INTRAVENOUS | Status: AC
  Filled 2021-07-04: qty 1

## 2021-07-04 MED ORDER — MIDAZOLAM HCL 5 MG/5ML IJ SOLN
INTRAMUSCULAR | Status: DC | PRN
Start: 2021-07-04 — End: 2021-07-04
  Administered 2021-07-04: 2 mg via INTRAVENOUS

## 2021-07-04 MED ORDER — LIDOCAINE HCL (CARDIAC) PF 100 MG/5ML IV SOSY
PREFILLED_SYRINGE | INTRAVENOUS | Status: DC | PRN
  Administered 2021-07-04: 40 mg via INTRAVENOUS

## 2021-07-04 MED ORDER — AMISULPRIDE (ANTIEMETIC) 5 MG/2ML IV SOLN: 10.0000 mg | Freq: Once | INTRAVENOUS | Status: DC | PRN

## 2021-07-04 MED ORDER — EPHEDRINE 5 MG/ML INJ
INTRAVENOUS | Status: AC
  Filled 2021-07-04: qty 5

## 2021-07-04 MED ORDER — ACETAMINOPHEN 325 MG PO TABS: 650.0000 mg | ORAL_TABLET | ORAL | Status: DC | PRN

## 2021-07-04 MED ORDER — CYCLOBENZAPRINE HCL 10 MG PO TABS
10.0000 mg | ORAL_TABLET | Freq: Three times a day (TID) | ORAL | Status: DC | PRN
  Administered 2021-07-04 – 2021-07-05 (×2): 10 mg via ORAL
  Filled 2021-07-04 (×2): qty 1

## 2021-07-04 MED ORDER — ACETAMINOPHEN 10 MG/ML IV SOLN
INTRAVENOUS | Status: AC
  Filled 2021-07-04: qty 100

## 2021-07-04 MED ORDER — INSULIN ASPART 100 UNIT/ML IJ SOLN: 0.0000 [IU] | INTRAMUSCULAR | Status: DC

## 2021-07-04 MED ORDER — METOPROLOL TARTRATE 25 MG PO TABS
50.0000 mg | ORAL_TABLET | Freq: Two times a day (BID) | ORAL | Status: DC
  Administered 2021-07-05 – 2021-07-06 (×3): 50 mg via ORAL
  Filled 2021-07-04 (×3): qty 2

## 2021-07-04 MED ORDER — THROMBIN 5000 UNITS EX SOLR
OROMUCOSAL | Status: DC | PRN
  Administered 2021-07-04: 5 mL via TOPICAL

## 2021-07-04 MED ORDER — ONDANSETRON HCL 4 MG PO TABS: 4.0000 mg | ORAL_TABLET | Freq: Four times a day (QID) | ORAL | Status: DC | PRN

## 2021-07-04 MED ORDER — THROMBIN 5000 UNITS EX SOLR
CUTANEOUS | Status: AC
  Filled 2021-07-04: qty 5000

## 2021-07-04 MED ORDER — MIDAZOLAM HCL 2 MG/2ML IJ SOLN
INTRAMUSCULAR | Status: AC
  Filled 2021-07-04: qty 2

## 2021-07-04 MED ORDER — CHLORHEXIDINE GLUCONATE 0.12 % MT SOLN: 15.0000 mL | Freq: Once | OROMUCOSAL | Status: AC

## 2021-07-04 MED ORDER — 0.9 % SODIUM CHLORIDE (POUR BTL) OPTIME
TOPICAL | Status: DC | PRN
  Administered 2021-07-04: 1000 mL

## 2021-07-04 MED ORDER — PAROXETINE HCL 20 MG PO TABS
40.0000 mg | ORAL_TABLET | Freq: Every day | ORAL | Status: DC
  Administered 2021-07-05 – 2021-07-06 (×2): 40 mg via ORAL
  Filled 2021-07-04 (×2): qty 2

## 2021-07-04 MED ORDER — PHENYLEPHRINE HCL-NACL 20-0.9 MG/250ML-% IV SOLN
INTRAVENOUS | Status: DC | PRN
Start: 2021-07-04 — End: 2021-07-04
  Administered 2021-07-04: 30 ug/min via INTRAVENOUS

## 2021-07-04 MED ORDER — DULOXETINE HCL 30 MG PO CPEP
60.0000 mg | ORAL_CAPSULE | Freq: Two times a day (BID) | ORAL | Status: DC
  Administered 2021-07-04 – 2021-07-06 (×4): 60 mg via ORAL
  Filled 2021-07-04 (×4): qty 2

## 2021-07-04 MED ORDER — OXYCODONE HCL 5 MG PO TABS
10.0000 mg | ORAL_TABLET | ORAL | Status: DC | PRN
  Administered 2021-07-04 – 2021-07-06 (×6): 10 mg via ORAL
  Filled 2021-07-04 (×6): qty 2

## 2021-07-04 MED ORDER — ACETAMINOPHEN 160 MG/5ML PO SOLN: 325.0000 mg | Freq: Once | ORAL | Status: DC | PRN

## 2021-07-04 MED ORDER — DICYCLOMINE HCL 20 MG PO TABS
20.0000 mg | ORAL_TABLET | ORAL | Status: DC
  Administered 2021-07-05 – 2021-07-06 (×2): 20 mg via ORAL
  Filled 2021-07-04 (×2): qty 1

## 2021-07-04 MED ORDER — DIAZEPAM 5 MG PO TABS
5.0000 mg | ORAL_TABLET | Freq: Two times a day (BID) | ORAL | Status: DC | PRN
  Administered 2021-07-04 – 2021-07-05 (×2): 5 mg via ORAL
  Filled 2021-07-04 (×2): qty 1

## 2021-07-04 MED ORDER — BUPIVACAINE-EPINEPHRINE 0.5% -1:200000 IJ SOLN
INTRAMUSCULAR | Status: AC
  Filled 2021-07-04: qty 1

## 2021-07-04 MED ORDER — DOCUSATE SODIUM 100 MG PO CAPS
100.0000 mg | ORAL_CAPSULE | Freq: Two times a day (BID) | ORAL | Status: DC
  Administered 2021-07-04 – 2021-07-06 (×5): 100 mg via ORAL
  Filled 2021-07-04 (×5): qty 1

## 2021-07-04 MED ORDER — ACETAMINOPHEN 500 MG PO TABS
1000.0000 mg | ORAL_TABLET | Freq: Four times a day (QID) | ORAL | Status: AC
  Administered 2021-07-04 (×2): 1000 mg via ORAL
  Administered 2021-07-05: 500 mg via ORAL
  Filled 2021-07-04 (×4): qty 2

## 2021-07-04 MED ORDER — LACTATED RINGERS IV SOLN: INTRAVENOUS | Status: DC

## 2021-07-04 MED ORDER — PROMETHAZINE HCL 25 MG/ML IJ SOLN: 6.2500 mg | INTRAMUSCULAR | Status: DC | PRN

## 2021-07-04 MED ORDER — BUPIVACAINE LIPOSOME 1.3 % IJ SUSP
INTRAMUSCULAR | Status: AC
  Filled 2021-07-04: qty 20

## 2021-07-04 MED ORDER — MORPHINE SULFATE (PF) 4 MG/ML IV SOLN
4.0000 mg | INTRAVENOUS | Status: DC | PRN
  Administered 2021-07-04 – 2021-07-05 (×2): 4 mg via INTRAVENOUS
  Filled 2021-07-04 (×2): qty 1

## 2021-07-04 MED ORDER — SODIUM CHLORIDE 0.9% FLUSH: 3.0000 mL | INTRAVENOUS | Status: DC | PRN

## 2021-07-04 MED ORDER — SODIUM CHLORIDE 0.9% FLUSH
3.0000 mL | Freq: Two times a day (BID) | INTRAVENOUS | Status: DC
  Administered 2021-07-04 (×2): 3 mL via INTRAVENOUS

## 2021-07-04 MED ORDER — HYDROCODONE-ACETAMINOPHEN 7.5-325 MG PO TABS
1.0000 | ORAL_TABLET | ORAL | Status: DC | PRN
  Administered 2021-07-05: 2 via ORAL
  Administered 2021-07-05: 1 via ORAL
  Administered 2021-07-06 (×2): 2 via ORAL
  Filled 2021-07-04: qty 2
  Filled 2021-07-04: qty 1
  Filled 2021-07-04 (×2): qty 2

## 2021-07-04 MED ORDER — ONDANSETRON HCL 4 MG/2ML IJ SOLN
INTRAMUSCULAR | Status: AC
  Filled 2021-07-04: qty 2

## 2021-07-04 MED ORDER — ACETAMINOPHEN 10 MG/ML IV SOLN
1000.0000 mg | Freq: Once | INTRAVENOUS | Status: DC | PRN
  Administered 2021-07-04: 1000 mg via INTRAVENOUS

## 2021-07-04 MED ORDER — ORAL CARE MOUTH RINSE: 15.0000 mL | Freq: Once | OROMUCOSAL | Status: AC

## 2021-07-04 MED ORDER — EPHEDRINE SULFATE 50 MG/ML IJ SOLN
INTRAMUSCULAR | Status: DC | PRN
  Administered 2021-07-04: 5 mg via INTRAVENOUS
  Administered 2021-07-04 (×3): 10 mg via INTRAVENOUS

## 2021-07-04 MED ORDER — PHENYLEPHRINE HCL (PRESSORS) 10 MG/ML IV SOLN
INTRAVENOUS | Status: DC | PRN
  Administered 2021-07-04 (×4): 100 ug via INTRAVENOUS

## 2021-07-04 MED ORDER — MEPERIDINE HCL 25 MG/ML IJ SOLN: 6.2500 mg | INTRAMUSCULAR | Status: DC | PRN

## 2021-07-04 MED ORDER — CHLORHEXIDINE GLUCONATE 0.12 % MT SOLN
OROMUCOSAL | Status: AC
  Administered 2021-07-04: 15 mL via OROMUCOSAL
  Filled 2021-07-04: qty 15

## 2021-07-04 MED ORDER — SODIUM CHLORIDE 0.9 % IV SOLN: INTRAVENOUS | Status: DC | PRN

## 2021-07-04 MED ORDER — PHENOL 1.4 % MT LIQD: 1.0000 | OROMUCOSAL | Status: DC | PRN

## 2021-07-04 MED ORDER — BUPIVACAINE-EPINEPHRINE (PF) 0.5% -1:200000 IJ SOLN
INTRAMUSCULAR | Status: DC | PRN
  Administered 2021-07-04: 10 mL via PERINEURAL

## 2021-07-04 MED ORDER — PANTOPRAZOLE SODIUM 40 MG PO TBEC
40.0000 mg | DELAYED_RELEASE_TABLET | Freq: Every day | ORAL | Status: DC
  Administered 2021-07-05 – 2021-07-06 (×2): 40 mg via ORAL
  Filled 2021-07-04 (×2): qty 1

## 2021-07-04 MED ORDER — ACETAZOLAMIDE 250 MG PO TABS
250.0000 mg | ORAL_TABLET | Freq: Every day | ORAL | Status: DC
  Administered 2021-07-05 – 2021-07-06 (×2): 250 mg via ORAL
  Filled 2021-07-04 (×2): qty 1

## 2021-07-04 MED ORDER — PROPOFOL 10 MG/ML IV BOLUS
INTRAVENOUS | Status: AC
  Filled 2021-07-04: qty 20

## 2021-07-04 MED ORDER — CHLORHEXIDINE GLUCONATE CLOTH 2 % EX PADS: 6.0000 | MEDICATED_PAD | Freq: Once | CUTANEOUS | Status: DC

## 2021-07-04 MED ORDER — ESTRADIOL 1 MG PO TABS
0.5000 mg | ORAL_TABLET | ORAL | Status: DC
  Administered 2021-07-05 – 2021-07-06 (×2): 0.5 mg via ORAL
  Filled 2021-07-04 (×2): qty 0.5

## 2021-07-04 MED ORDER — ACETAMINOPHEN 325 MG PO TABS: 325.0000 mg | ORAL_TABLET | Freq: Once | ORAL | Status: DC | PRN

## 2021-07-04 MED ORDER — LEVOTHYROXINE SODIUM 100 MCG PO TABS
100.0000 ug | ORAL_TABLET | Freq: Every day | ORAL | Status: DC
  Administered 2021-07-05 – 2021-07-06 (×2): 100 ug via ORAL
  Filled 2021-07-04 (×2): qty 1

## 2021-07-04 MED ORDER — LIDOCAINE HCL (PF) 2 % IJ SOLN
INTRAMUSCULAR | Status: AC
  Filled 2021-07-04: qty 5

## 2021-07-04 MED ORDER — ONDANSETRON HCL 4 MG/2ML IJ SOLN
INTRAMUSCULAR | Status: DC | PRN
  Administered 2021-07-04: 4 mg via INTRAVENOUS

## 2021-07-04 MED ORDER — CEFAZOLIN SODIUM-DEXTROSE 2-4 GM/100ML-% IV SOLN
2.0000 g | INTRAVENOUS | Status: AC
  Administered 2021-07-04: 2 g via INTRAVENOUS

## 2021-07-04 MED ORDER — OXYCODONE HCL 5 MG PO TABS: 5.0000 mg | ORAL_TABLET | ORAL | Status: DC | PRN

## 2021-07-04 MED ORDER — ATROPINE SULFATE 0.4 MG/ML IJ SOLN
INTRAMUSCULAR | Status: AC
  Filled 2021-07-04: qty 1

## 2021-07-04 MED ORDER — FLUTICASONE PROPIONATE 50 MCG/ACT NA SUSP
2.0000 | Freq: Every morning | NASAL | Status: DC
  Administered 2021-07-05 – 2021-07-06 (×2): 2 via NASAL
  Filled 2021-07-04: qty 16

## 2021-07-04 MED ORDER — SODIUM CHLORIDE 0.9 % IV SOLN: 250.0000 mL | INTRAVENOUS | Status: DC

## 2021-07-04 MED ORDER — ZOLPIDEM TARTRATE 5 MG PO TABS: 5.0000 mg | ORAL_TABLET | Freq: Every evening | ORAL | Status: DC | PRN

## 2021-07-04 MED ORDER — MENTHOL 3 MG MT LOZG: 1.0000 | LOZENGE | OROMUCOSAL | Status: DC | PRN

## 2021-07-04 MED ORDER — BISACODYL 10 MG RE SUPP: 10.0000 mg | Freq: Every day | RECTAL | Status: DC | PRN

## 2021-07-04 MED ORDER — CEFAZOLIN SODIUM-DEXTROSE 2-4 GM/100ML-% IV SOLN
2.0000 g | Freq: Three times a day (TID) | INTRAVENOUS | Status: AC
  Administered 2021-07-04 (×2): 2 g via INTRAVENOUS
  Filled 2021-07-04 (×2): qty 100

## 2021-07-04 MED ORDER — HYDROMORPHONE HCL 1 MG/ML IJ SOLN
0.2500 mg | INTRAMUSCULAR | Status: DC | PRN
  Administered 2021-07-04 (×2): 0.5 mg via INTRAVENOUS

## 2021-07-04 MED ORDER — ROCURONIUM BROMIDE 100 MG/10ML IV SOLN
INTRAVENOUS | Status: DC | PRN
  Administered 2021-07-04: 60 mg via INTRAVENOUS
  Administered 2021-07-04 (×2): 20 mg via INTRAVENOUS

## 2021-07-04 MED ORDER — ATORVASTATIN CALCIUM 10 MG PO TABS
20.0000 mg | ORAL_TABLET | Freq: Every day | ORAL | Status: DC
  Administered 2021-07-05 – 2021-07-06 (×2): 20 mg via ORAL
  Filled 2021-07-04 (×2): qty 2

## 2021-07-04 MED ORDER — BACITRACIN ZINC 500 UNIT/GM EX OINT
TOPICAL_OINTMENT | CUTANEOUS | Status: AC
  Filled 2021-07-04: qty 28.35

## 2021-07-04 MED ORDER — SUGAMMADEX SODIUM 500 MG/5ML IV SOLN
INTRAVENOUS | Status: DC | PRN
  Administered 2021-07-04: 400 mg via INTRAVENOUS

## 2021-07-04 MED ORDER — ALBUTEROL SULFATE (2.5 MG/3ML) 0.083% IN NEBU: 2.5000 mg | INHALATION_SOLUTION | RESPIRATORY_TRACT | Status: DC | PRN

## 2021-07-04 MED ORDER — FENTANYL CITRATE (PF) 100 MCG/2ML IJ SOLN
INTRAMUSCULAR | Status: DC | PRN
  Administered 2021-07-04: 50 ug via INTRAVENOUS
  Administered 2021-07-04: 100 ug via INTRAVENOUS
  Administered 2021-07-04 (×2): 50 ug via INTRAVENOUS

## 2021-07-04 MED ORDER — ROCURONIUM BROMIDE 10 MG/ML (PF) SYRINGE
PREFILLED_SYRINGE | INTRAVENOUS | Status: AC
  Filled 2021-07-04: qty 10

## 2021-07-04 MED ORDER — FENTANYL CITRATE (PF) 250 MCG/5ML IJ SOLN
INTRAMUSCULAR | Status: AC
  Filled 2021-07-04: qty 5

## 2021-07-04 MED ORDER — ONDANSETRON HCL 4 MG/2ML IJ SOLN: 4.0000 mg | Freq: Four times a day (QID) | INTRAMUSCULAR | Status: DC | PRN

## 2021-07-04 SURGICAL SUPPLY — 66 items
APL SKNCLS STERI-STRIP NONHPOA (GAUZE/BANDAGES/DRESSINGS) ×1
BAG COUNTER SPONGE SURGICOUNT (BAG) ×2 IMPLANT
BAG SPNG CNTER NS LX DISP (BAG) ×1
BASKET BONE COLLECTION (BASKET) ×2 IMPLANT
BENZOIN TINCTURE PRP APPL 2/3 (GAUZE/BANDAGES/DRESSINGS) ×2 IMPLANT
BLADE CLIPPER SURG (BLADE) IMPLANT
BUR MATCHSTICK NEURO 3.0 LAGG (BURR) ×2 IMPLANT
BUR PRECISION FLUTE 6.0 (BURR) ×2 IMPLANT
CAGE ALTERA 10X31X10-14 15D (Cage) ×1 IMPLANT
CANISTER SUCT 3000ML PPV (MISCELLANEOUS) ×2 IMPLANT
CAP LOCK DLX THRD (Cap) ×4 IMPLANT
CARTRIDGE OIL MAESTRO DRILL (MISCELLANEOUS) ×1 IMPLANT
CLSR STERI-STRIP ANTIMIC 1/2X4 (GAUZE/BANDAGES/DRESSINGS) ×1 IMPLANT
CNTNR URN SCR LID CUP LEK RST (MISCELLANEOUS) ×1 IMPLANT
CONT SPEC 4OZ STRL OR WHT (MISCELLANEOUS) ×2
COVER BACK TABLE 60X90IN (DRAPES) ×2 IMPLANT
DECANTER SPIKE VIAL GLASS SM (MISCELLANEOUS) ×1 IMPLANT
DIFFUSER DRILL AIR PNEUMATIC (MISCELLANEOUS) ×2 IMPLANT
DRAPE C-ARM 42X72 X-RAY (DRAPES) ×4 IMPLANT
DRAPE HALF SHEET 40X57 (DRAPES) ×2 IMPLANT
DRAPE LAPAROTOMY 100X72X124 (DRAPES) ×2 IMPLANT
DRAPE SURG 17X23 STRL (DRAPES) ×8 IMPLANT
DRSG OPSITE POSTOP 4X6 (GAUZE/BANDAGES/DRESSINGS) ×2 IMPLANT
ELECT BLADE 4.0 EZ CLEAN MEGAD (MISCELLANEOUS) ×2
ELECT REM PT RETURN 9FT ADLT (ELECTROSURGICAL) ×2
ELECTRODE BLDE 4.0 EZ CLN MEGD (MISCELLANEOUS) ×1 IMPLANT
ELECTRODE REM PT RTRN 9FT ADLT (ELECTROSURGICAL) ×1 IMPLANT
EVACUATOR 1/8 PVC DRAIN (DRAIN) IMPLANT
GAUZE 4X4 16PLY ~~LOC~~+RFID DBL (SPONGE) ×2 IMPLANT
GLOVE EXAM NITRILE XL STR (GLOVE) IMPLANT
GLOVE SURG ENC MOIS LTX SZ8 (GLOVE) ×4 IMPLANT
GLOVE SURG ENC MOIS LTX SZ8.5 (GLOVE) ×4 IMPLANT
GOWN STRL REUS W/ TWL LRG LVL3 (GOWN DISPOSABLE) IMPLANT
GOWN STRL REUS W/ TWL XL LVL3 (GOWN DISPOSABLE) ×2 IMPLANT
GOWN STRL REUS W/TWL 2XL LVL3 (GOWN DISPOSABLE) IMPLANT
GOWN STRL REUS W/TWL LRG LVL3 (GOWN DISPOSABLE)
GOWN STRL REUS W/TWL XL LVL3 (GOWN DISPOSABLE) ×4
HEMOSTAT POWDER KIT SURGIFOAM (HEMOSTASIS) ×2 IMPLANT
KIT BASIN OR (CUSTOM PROCEDURE TRAY) ×2 IMPLANT
KIT TURNOVER KIT B (KITS) ×2 IMPLANT
MILL MEDIUM DISP (BLADE) ×2 IMPLANT
NDL HYPO 21X1.5 SAFETY (NEEDLE) IMPLANT
NEEDLE HYPO 21X1.5 SAFETY (NEEDLE) IMPLANT
NEEDLE HYPO 22GX1.5 SAFETY (NEEDLE) ×2 IMPLANT
NS IRRIG 1000ML POUR BTL (IV SOLUTION) ×2 IMPLANT
OIL CARTRIDGE MAESTRO DRILL (MISCELLANEOUS) ×2
PACK LAMINECTOMY NEURO (CUSTOM PROCEDURE TRAY) ×2 IMPLANT
PAD ARMBOARD 7.5X6 YLW CONV (MISCELLANEOUS) ×6 IMPLANT
PATTIES SURGICAL .5 X1 (DISPOSABLE) ×1 IMPLANT
PUTTY DBM 10CC CALC GRAN (Putty) ×1 IMPLANT
ROD CURVED TI 6.35X45 (Rod) ×2 IMPLANT
SCREW PA CREO DLX 6.5X45 (Screw) ×1 IMPLANT
SCREW PA DLX CREO 7.5X45 (Screw) ×2 IMPLANT
SCREW PA DLX CREO 7.5X50 (Screw) ×1 IMPLANT
SPONGE NEURO XRAY DETECT 1X3 (DISPOSABLE) IMPLANT
SPONGE SURGIFOAM ABS GEL 100 (HEMOSTASIS) IMPLANT
SPONGE T-LAP 4X18 ~~LOC~~+RFID (SPONGE) IMPLANT
STRIP CLOSURE SKIN 1/2X4 (GAUZE/BANDAGES/DRESSINGS) ×2 IMPLANT
SUT VIC AB 1 CT1 18XBRD ANBCTR (SUTURE) ×2 IMPLANT
SUT VIC AB 1 CT1 8-18 (SUTURE) ×4
SUT VIC AB 2-0 CP2 18 (SUTURE) ×4 IMPLANT
SYR 20ML LL LF (SYRINGE) IMPLANT
TOWEL GREEN STERILE (TOWEL DISPOSABLE) ×2 IMPLANT
TOWEL GREEN STERILE FF (TOWEL DISPOSABLE) ×2 IMPLANT
TRAY FOLEY MTR SLVR 16FR STAT (SET/KITS/TRAYS/PACK) ×2 IMPLANT
WATER STERILE IRR 1000ML POUR (IV SOLUTION) ×2 IMPLANT

## 2021-07-04 NOTE — Progress Notes (Signed)
Pacu RN Report to floor given  Gave report to [employer], rm [phone].  Discussed surgery, meds given in OR and Pacu, VS, IV fluids given, EBL, urine output, pain and other pertinent information. Also discussed if pt had any family or friends here or belongings with them. Ortho tech will bring back brace up to room. Pt DTV 1700-1900.  Pt exits my care.

## 2021-07-04 NOTE — H&P (Signed)
Subjective: The patient is a 59 year old obese white female who is complained of back and bilateral leg pain.  She has failed medical management.  She was worked up with a lumbar MRI and lumbar x-rays which demonstrated spondylolisthesis and spinal stenosis.  I discussed the various treatment options with her.  She has decided proceed with surgery.  Past Medical History:  Diagnosis Date   Anemia    Anxiety disorder    Arthritis    Chronic low back pain    Cirrhosis of liver ([place])    Colitis    Depression    Diabetes mellitus    Type II   Diabetic neuropathy (H[facility])    Diverticulitis    Fatty liver    GERD (gastroesophageal reflux disease)    High cholesterol    Hypertension    Hypothyroidism    Irritable bowel syndrome    Meniere's disease    Neuropathy    Pain    Pneumonia     Past Surgical History:  Procedure Laterality Date   ABDOMINAL HYSTERECTOMY     BIOPSY  03/20/2017   Procedure: BIOPSY;  Surgeon: [name], MD;  [facility];  Service: Endoscopy;;  colon gastric   bladder tact  2005   CHOLECYSTECTOMY  08/11   COLONOSCOPY  2208   COLONOSCOPY  In of September 2014   @ [facility]/Dr,.Benson   COLONOSCOPY WITH PROPOFOL N/A 03/20/2017   Procedure: COLONOSCOPY WITH PROPOFOL;  Surgeon: [name], MD;  [facility];  Service: Endoscopy;  Laterality: N/A;   ECTOPIC PREGNANCY SURGERY  1996   ESOPHAGOGASTRODUODENOSCOPY (EGD) WITH PROPOFOL N/A 03/20/2017   Procedure: ESOPHAGOGASTRODUODENOSCOPY (EGD) WITH PROPOFOL;  Surgeon: [name], MD;  [facility];  Service: Endoscopy;  Laterality: N/A;  12:45   HERNIA REPAIR  09/17/11   LIGAMENT REPAIR Left 02/10/2018   Procedure: Left  ANKLE ATFL Repair and Ligament Augmentation, Injection of Tendon Splint Application;  Surgeon: [name], DPM;  Location: [place];  Service: Podiatry;  Laterality: Left;   [place]  2009   Preformed on the right shoulder   OVARIAN CYST REMOVAL     right  side   PARTIAL HYSTERECTOMY  2005   POLYPECTOMY  03/20/2017   Procedure: POLYPECTOMY;  Surgeon: [name], MD;  [facility];  Service: Endoscopy;;  colon   TENDON REPAIR Left 02/10/2018   Procedure: PERONEAL TENDON REPAIR and Synovectomy Peroneal Tendon;  Surgeon: [name], DPM;  Location: [place];  Service: Podiatry;  Laterality: Left;   UPPER GASTROINTESTINAL ENDOSCOPY  2008   URETERAL STENT PLACEMENT      Allergies  Allergen Reactions   Flagyl [Metronidazole Hcl] Itching and Rash   Nsaids Nausea Only    Social History   Tobacco Use   Smoking status: Never   Smokeless tobacco: Never  Substance Use Topics   Alcohol use: No    Family History  Problem Relation Age of Onset   Healthy Mother    Heart disease Father    Esophageal cancer Brother    Healthy Son    Healthy Son    Prior to Admission medications   Medication Sig Start Date End Date Taking? Authorizing Provider  acetaZOLAMIDE (DIAMOX) 250 MG tablet Take 250 mg by mouth daily.  04/28/16  Yes [provider]  acyclovir (ZOVIRAX) 400 MG tablet Take 400 mg by mouth at bedtime. 06/25/16  Yes [provider]  aspirin EC 81 MG tablet  Take 1 tablet (81 mg total) by mouth daily. Swallow whole. 10/02/20  Yes Tolia, Sunit, DO  atorvastatin (LIPITOR) 20 MG tablet TAKE ONE TABLET BY MOUTH DAILY (MORNING PER PT) 01/30/21  Yes Nida, Marella Chimes, MD  azelastine (OPTIVAR) 0.05 % ophthalmic solution Place 1 drop into both eyes daily.   Yes [provider]  cyanocobalamin (,VITAMIN B-12,) 1000 MCG/ML injection Inject 1,000 mcg into the muscle every 30 (thirty) days. 04/14/16  Yes [provider]  diazepam (VALIUM) 5 MG tablet Take 5 mg by mouth every 12 (twelve) hours as needed for anxiety.  04/07/16  Yes [provider]  diclofenac Sodium (VOLTAREN) 1 % GEL Apply 2 g topically at bedtime as needed (pain). 12/18/20  Yes [provider]  dicyclomine (BENTYL) 20 MG  tablet Take 1 tablet (20 mg total) by mouth 3 (three) times daily. Patient taking differently: Take 20 mg by mouth every morning. 02/22/21  Yes Rehman, Mechele Dawley, MD  DULoxetine (CYMBALTA) 60 MG capsule Take 60 mg by mouth 2 (two) times daily. 04/28/16  Yes [provider]  estradiol (ESTRACE) 0.5 MG tablet Take 0.5 mg by mouth every morning. 01/27/17  Yes [provider]  fluticasone (FLONASE) 50 MCG/ACT nasal spray Place 2 sprays into both nostrils every morning.   Yes [provider]  HYDROcodone-acetaminophen (NORCO) 7.5-325 MG tablet Take 1 tablet by mouth every 6 (six) hours as needed for moderate pain.   Yes [provider]  insulin aspart (NOVOLOG) 100 UNIT/ML injection Inject 10-16 Units into the skin 3 (three) times daily with meals. Patient taking differently: Inject 15-45 Units into the skin 4 (four) times daily. 09/17/20  Yes Reardon, Juanetta Beets, NP  Insulin Degludec (TRESIBA) 100 UNIT/ML SOLN Inject 50 Units into the skin at bedtime. Patient taking differently: Inject 15 Units into the skin at bedtime. 11/12/20  Yes Reardon, Juanetta Beets, NP  levothyroxine (SYNTHROID) 100 MCG tablet TAKE ONE TABLET BY MOUTH DAILY BEFORE BREAKFAST Patient taking differently: Take 100 mcg by mouth daily before breakfast. TAKE ONE TABLET BY MOUTH DAILY BEFORE BREAKFAST 12/10/20  Yes Reardon, Loree Fee J, NP  lisinopril (ZESTRIL) 20 MG tablet Take 20 mg by mouth daily.  03/22/20  Yes [provider]  metFORMIN (GLUCOPHAGE) 500 MG tablet TAKE ONE TABLET BY MOUTH TWICE DAILY. TAKE WITH A MEAL. (MORNING ,EVENING) 01/30/21  Yes Nida, Marella Chimes, MD  metoprolol tartrate (LOPRESSOR) 50 MG tablet Take 1 tablet (50 mg total) by mouth 2 (two) times daily. 10/19/20  Yes Tolia, Sunit, DO  mupirocin ointment (BACTROBAN) 2 % Apply 1 application topically 3 (three) times daily as needed (diabetic sores). 05/14/21  Yes [provider]  ondansetron (ZOFRAN) 4 MG tablet Take 1 tablet  (4 mg total) by mouth every 8 (eight) hours as needed for nausea or vomiting. 09/24/16  Yes Rolland Porter, MD  pantoprazole (PROTONIX) 40 MG tablet Take 40 mg by mouth daily before breakfast. 01/13/17  Yes [provider]  PARoxetine (PAXIL) 40 MG tablet Take 40 mg by mouth daily. 08/23/20  Yes [provider]  albuterol (PROVENTIL) (2.5 MG/3ML) 0.083% nebulizer solution Take 2.5 mg by nebulization every 4 (four) hours as needed for shortness of breath. 10/26/20   [provider]  baclofen (LIORESAL) 10 MG tablet Take 10 mg by mouth 2 (two) times daily as needed for muscle spasms. 02/15/20   [provider]  Continuous Blood Gluc Sensor (FREESTYLE LIBRE 14 DAY SENSOR) MISC Inject 1 each into the skin  every 14 (fourteen) days. Use as directed. 05/09/19   Cassandria Anger, MD  glucose blood (FREESTYLE LITE) test strip Use as instructed 05/28/17   Cassandria Anger, MD  Insulin Syringe-Needle U-100 (EASY TOUCH INSULIN SYRINGE) 31G X 5/16" 1 ML MISC Use as directed to inject insulin four times daily 03/05/21   Cassandria Anger, MD     Review of Systems  Positive ROS: As above  All other systems have been reviewed and were otherwise negative with the exception of those mentioned in the HPI and as above.  Objective: Vital signs in last 24 hours: Temp:  [97.5 F (36.4 C)] 97.5 F (36.4 C) (09/29 0547) Pulse Rate:  [63] 63 (09/29 0547) Resp:  [17] 17 (09/29 0547) BP: (126)/(76) 126/76 (09/29 0547) SpO2:  [98 %] 98 % (09/29 0547) Weight:  [78.5 kg] 78.5 kg (09/29 0547) Estimated body mass index is 31.64 kg/m as calculated from the following:   Height as of this encounter: 5' 2"  (1.575 m).   Weight as of this encounter: 78.5 kg.   General Appearance: Alert, obese Head: Normocephalic, without obvious abnormality, atraumatic Eyes: PERRL, conjunctiva/corneas clear, EOM's intact,    Ears: Normal  Throat: Normal  Neck: Supple, Back: unremarkable Lungs:  Clear to auscultation bilaterally, respirations unlabored Heart: Regular rate and rhythm, no murmur, rub or gallop Abdomen: Soft, non-tender Extremities: Extremities normal, atraumatic, no cyanosis or edema Skin: unremarkable  NEUROLOGIC:   Mental status: alert and oriented,Motor Exam - grossly normal Sensory Exam - grossly normal Reflexes:  Coordination - grossly normal Gait - grossly normal Balance - grossly normal Cranial Nerves: I: smell Not tested  II: visual acuity  OS: Normal  OD: Normal   II: visual fields Full to confrontation  II: pupils Equal, round, reactive to light  III,VII: ptosis None  III,IV,VI: extraocular muscles  Full ROM  V: mastication Normal  V: facial light touch sensation  Normal  V,VII: corneal reflex  Present  VII: facial muscle function - upper  Normal  VII: facial muscle function - lower Normal  VIII: hearing Not tested  IX: soft palate elevation  Normal  IX,X: gag reflex Present  XI: trapezius strength  5/5  XI: sternocleidomastoid strength 5/5  XI: neck flexion strength  5/5  XII: tongue strength  Normal    Data Review Lab Results  Component Value Date   WBC 11.9 (H) 07/01/2021   HGB 12.8 07/01/2021   HCT 39.9 07/01/2021   MCV 96.4 07/01/2021   PLT 268 07/01/2021   Lab Results  Component Value Date   NA 140 07/01/2021   K 3.7 07/01/2021   CL 108 07/01/2021   CO2 25 07/01/2021   BUN 19 07/01/2021   CREATININE 1.28 (H) 07/01/2021   GLUCOSE 95 07/01/2021   No results found for: INR, PROTIME  Assessment/Plan: L4-5 spondylolisthesis, facet arthropathy, spinal stenosis, lumbar radiculopathy, lumbago, neurogenic claudication: I have discussed the situation with the patient.  I reviewed her imaging studies with her and pointed out the abnormalities.  We have discussed the various treatment options including surgery.  I have described the surgical treatment option of an L4-5 decompression, instrumentation and fusion.  I have shown her  surgical models.  I have given her a surgical pamphlet.  We have discussed the risk, benefits, alternatives, expected postop course, and likelihood of achieving our goals with surgery.  I have answered all her questions.  She has decided proceed with surgery.   [name] 07/04/2021 7:26 AM

## 2021-07-04 NOTE — Anesthesia Procedure Notes (Signed)
Procedure Name: Intubation Date/Time: 07/04/2021 7:56 AM Performed by: Jonna Munro, CRNA Pre-anesthesia Checklist: Patient identified, Emergency Drugs available, Suction available, Patient being monitored and Timeout performed Patient Re-evaluated:Patient Re-evaluated prior to induction Oxygen Delivery Method: Circle system utilized Preoxygenation: Pre-oxygenation with 100% oxygen Induction Type: IV induction Ventilation: Mask ventilation without difficulty Laryngoscope Size: Mac and 3 Grade View: Grade I Tube type: Oral Tube size: 7.0 mm Number of attempts: 1 Airway Equipment and Method: Stylet Placement Confirmation: ETT inserted through vocal cords under direct vision, positive ETCO2 and breath sounds checked- equal and bilateral Secured at: 22 cm Tube secured with: Tape Dental Injury: Teeth and Oropharynx as per pre-operative assessment

## 2021-07-04 NOTE — Anesthesia Postprocedure Evaluation (Signed)
Anesthesia Post Note  Patient: [name]  Procedure(s) Performed: Posterior lumbar interbodu fusion,Interbody Prosthesis ,POSTERIOR INSTRUMENTATION Lumbar four-five     Patient location during evaluation: PACU Anesthesia Type: General Level of consciousness: awake and alert Pain management: pain level controlled Vital Signs Assessment: post-procedure vital signs reviewed and stable Respiratory status: spontaneous breathing, nonlabored ventilation, respiratory function stable and patient connected to nasal cannula oxygen Cardiovascular status: blood pressure returned to baseline and stable Postop Assessment: no apparent nausea or vomiting Anesthetic complications: no   No notable events documented.  Last Vitals:  Vitals:   07/04/21 1312 07/04/21 1538  BP: 116/62 (!) 101/55  Pulse: 77 83  Resp: 18 19  Temp: 36.6 C 36.5 C  SpO2: 93% 97%    Last Pain:  Vitals:   07/04/21 1703  TempSrc:   PainSc: Asleep                 [name]

## 2021-07-04 NOTE — Transfer of Care (Signed)
Immediate Anesthesia Transfer of Care Note  Patient: [name]  Procedure(s) Performed: Posterior lumbar interbodu fusion,Interbody Prosthesis ,POSTERIOR INSTRUMENTATION Lumbar four-five  Patient Location: PACU  Anesthesia Type:General  Level of Consciousness: awake, alert , oriented and patient cooperative  Airway & Oxygen Therapy: Patient Spontanous Breathing and Patient connected to face mask oxygen  Post-op Assessment: Report given to RN, Post -op Vital signs reviewed and stable and Patient moving all extremities X 4  Post vital signs: Reviewed and stable  Last Vitals:  Vitals Value Taken Time  BP 133/63 07/04/21 1130  Temp    Pulse 77 07/04/21 1134  Resp 47 07/04/21 1134  SpO2 100 % 07/04/21 1134  Vitals shown include unvalidated device data.  Last Pain:  Vitals:   07/04/21 0611  TempSrc:   PainSc: 6       Patients Stated Pain Goal: 2 (19/69/40 9828)  Complications: No notable events documented.

## 2021-07-04 NOTE — Op Note (Signed)
Brief history: The patient is a 60 year old obese white female who has complained of back and bilateral leg pain consistent with neurogenic claudication.  She has failed medical management.  She was worked up with a lumbar MRI and lumbar x-rays which demonstrated an L4-5 spondylolisthesis, spinal stenosis, facet arthropathy, etc.  I discussed the various treatment options with her.  She has decided proceed with surgery.  Preoperative diagnosis: L4-5 spondylolisthesis, facet arthropathy, degenerative disc disease, spinal stenosis compressing both the L4 and the L5 nerve roots; lumbago; lumbar radiculopathy; neurogenic claudication  Postoperative diagnosis: The same  Procedure: Bilateral L4-5 laminotomy/foraminotomies/medial facetectomy to decompress the bilateral L4 and L5 nerve roots(the work required to do this was in addition to the work required to do the posterior lumbar interbody fusion because of the patient's spinal stenosis, facet arthropathy. Etc. requiring a wide decompression of the nerve roots.);  L4-5 transforaminal lumbar interbody fusion with local morselized autograft bone and Zimmer DBM; insertion of interbody prosthesis at L4-5 (globus peek expandable interbody prosthesis); posterior nonsegmental instrumentation from L4 to L5 with globus titanium pedicle screws and rods; posterior lateral arthrodesis at L4-5 with local morselized autograft bone and Zimmer DBM.  Surgeon: Dr. Earle Gell  Asst.: Arnetha Massy, NP  Anesthesia: Gen. endotracheal  Estimated blood loss: 200 cc  Drains: None  Complications: None  Description of procedure: The patient was brought to the operating room by the anesthesia team. General endotracheal anesthesia was induced. The patient was turned to the prone position on the Wilson frame. The patient's lumbosacral region was then prepared with Betadine scrub and Betadine solution. Sterile drapes were applied.  I then injected the area to be incised with  Marcaine with epinephrine solution. I then used the scalpel to make a linear midline incision over the L4-5 interspace. I then used electrocautery to perform a bilateral subperiosteal dissection exposing the spinous process and lamina of L4 and L5. We then obtained intraoperative radiograph to confirm our location. We then inserted the Verstrac retractor to provide exposure.  I began the decompression by using the high speed drill to perform laminotomies at L4-5 bilaterally. We then used the Kerrison punches to widen the laminotomy and removed the ligamentum flavum at L4-5 bilaterally. We used the Kerrison punches to remove the medial facets at L4-5, we removed the left L4-5 facet. We performed wide foraminotomies about the bilateral L4 and L5 nerve roots completing the decompression.  We now turned our attention to the posterior lumbar interbody fusion. I used a scalpel to incise the intervertebral disc at L4-5 bilaterally. I then performed a partial intervertebral discectomy at L4-5 bilaterally using the pituitary forceps. We prepared the vertebral endplates at F2-9 bilaterally for the fusion by removing the soft tissues with the curettes. We then used the trial spacers to pick the appropriate sized interbody prosthesis. We prefilled his prosthesis with a combination of local morselized autograft bone that we obtained during the decompression as well as Zimmer DBM. We inserted the prefilled prosthesis into the interspace at L4-5 from the left, we then turned and expanded the prosthesis. There was a good snug fit of the prosthesis in the interspace. We then filled and the remainder of the intervertebral disc space with local morselized autograft bone and Zimmer DBM. This completed the posterior lumbar interbody arthrodesis.  During the decompression and insertion of the prosthesis the assistant protected the thecal sac and nerve roots with the D'Errico retractor.  We now turned attention to the  instrumentation. Under fluoroscopic guidance we cannulated  the bilateral L4 and L5 pedicles with the bone probe. We then removed the bone probe. We then tapped the pedicle with a 6.5 millimeter tap. We then removed the tap. We probed inside the tapped pedicle with a ball probe to rule out cortical breaches. We then inserted a 6.5 x 45, 7.5 x 45 and 7.5 x 50 millimeter pedicle screw into the L4 and L5 pedicles bilaterally under fluoroscopic guidance. We then palpated along the medial aspect of the pedicles to rule out cortical breaches. There were none. The nerve roots were not injured. We then connected the unilateral pedicle screws with a lordotic rod. We compressed the construct and secured the rod in place with the caps. We then tightened the caps appropriately. This completed the instrumentation from L4-5 bilaterally.  We now turned our attention to the posterior lateral arthrodesis at L4-5. We used the high-speed drill to decorticate the remainder of the facets, pars, transverse process at L4-5. We then applied a combination of local morselized autograft bone and Zimmer DBM over these decorticated posterior lateral structures. This completed the posterior lateral arthrodesis.  We then obtained hemostasis using bipolar electrocautery. We irrigated the wound out with bacitracin solution. We inspected the thecal sac and nerve roots and noted they were well decompressed. We then removed the retractor.  We injected Exparel . We reapproximated patient's thoracolumbar fascia with interrupted #1 Vicryl suture. We reapproximated patient's subcutaneous tissue with interrupted 2-0 Vicryl suture. The reapproximated patient's skin with Steri-Strips and benzoin. The wound was then coated with bacitracin ointment. A sterile dressing was applied. The drapes were removed. The patient was subsequently returned to the supine position where they were extubated by the anesthesia team. He was then transported to the post  anesthesia care unit in stable condition. All sponge instrument and needle counts were reportedly correct at the end of this case.

## 2021-07-04 NOTE — Progress Notes (Signed)
Orthopedic Tech Progress Note Patient Details:  [name] August 28, 1961 [id number]  Ortho Devices Type of Ortho Device: Lumbar corsett Ortho Device/Splint Location: Back Ortho Device/Splint Interventions: Ordered      Linus Salmons Jaice Digioia 07/04/2021, 4:52 PM

## 2021-07-05 LAB — GLUCOSE, CAPILLARY
Glucose-Capillary: 199 mg/dL — ABNORMAL HIGH (ref 70–99)
Glucose-Capillary: 178 mg/dL — ABNORMAL HIGH (ref 70–99)
Glucose-Capillary: 239 mg/dL — ABNORMAL HIGH (ref 70–99)
Glucose-Capillary: 181 mg/dL — ABNORMAL HIGH (ref 70–99)

## 2021-07-05 LAB — BASIC METABOLIC PANEL
Anion gap: 7 (ref 5–15)
BUN: 10 mg/dL (ref 6–20)
CO2: 20 mmol/L — ABNORMAL LOW (ref 22–32)
Calcium: 7.5 mg/dL — ABNORMAL LOW (ref 8.9–10.3)
Chloride: 111 mmol/L (ref 98–111)
Creatinine, Ser: 1.02 mg/dL — ABNORMAL HIGH (ref 0.44–1.00)
GFR, Estimated: >60 mL/min (ref >60)
Glucose, Bld: 230 mg/dL — ABNORMAL HIGH (ref 70–99)
Potassium: 4 mmol/L (ref 3.5–5.1)
Sodium: 138 mmol/L (ref 135–145)

## 2021-07-05 LAB — CBC
HCT: 33.6 % — ABNORMAL LOW (ref 36.0–46.0)
Hemoglobin: 10.7 g/dL — ABNORMAL LOW (ref 12.0–15.0)
MCH: 30.7 pg (ref 26.0–34.0)
MCHC: 31.8 g/dL (ref 30.0–36.0)
MCV: 96.6 fL (ref 80.0–100.0)
Platelets: 152 10*3/uL (ref 150–400)
RBC: 3.48 MIL/uL — ABNORMAL LOW (ref 3.87–5.11)
RDW: 14.6 % (ref 11.5–15.5)
WBC: 10 10*3/uL (ref 4.0–10.5)
nRBC: 0 % (ref 0.0–0.2)

## 2021-07-05 MED FILL — Sodium Chloride IV Soln 0.9%: INTRAVENOUS | Qty: 2000 | Status: AC

## 2021-07-05 MED FILL — Heparin Sodium (Porcine) Inj 1000 Unit/ML: INTRAMUSCULAR | Qty: 30 | Status: AC

## 2021-07-05 NOTE — Evaluation (Signed)
Physical Therapy Evaluation Patient Details Name: [name] MRN: [id number] DOB: 1961-10-04 Today's Date: 07/05/2021  History of Present Illness  Pt is a 60 y/o female who presents s/p L4-5 PLIF on 07/04/2021, PMH of meneire's disease, DM II, HTN, and anxiety disorder.   Clinical Impression  Pt admitted with above diagnosis. At the time of PT eval, pt was able to demonstrate transfers and ambulation with gross min guard assist and RW for support. Pt was educated on precautions, brace application/wearing schedule, appropriate activity progression, and car transfer. Pt currently with functional limitations due to the deficits listed below (see PT Problem List). Pt will benefit from skilled PT to increase their independence and safety with mobility to allow discharge to the venue listed below.         Recommendations for follow up therapy are one component of a multi-disciplinary discharge planning process, led by the attending physician.  Recommendations may be updated based on patient status, additional functional criteria and insurance authorization.  Follow Up Recommendations Home health PT;Supervision for mobility/OOB    Equipment Recommendations  Rolling walker with 5" wheels;3in1 (PT)    Recommendations for Other Services       Precautions / Restrictions Precautions Precautions: Back Precaution Booklet Issued: Yes (comment) Precaution Comments: Reviewed handout and pt was cued for precautions during functional mobility. Required Braces or Orthoses: Spinal Brace Spinal Brace: Lumbar corset;Applied in sitting position Restrictions Weight Bearing Restrictions: No      Mobility  Bed Mobility Overal bed mobility: Needs Assistance Bed Mobility: Rolling;Sidelying to Sit Rolling: Supervision Sidelying to sit: Min guard       General bed mobility comments: HOB flat and rails lowered to simulate home environment. Pt required increased time and close guard as she elevated trunk  to full sitting position.    Transfers Overall transfer level: Needs assistance Equipment used: Rolling walker (2 wheeled) Transfers: Sit to/from Stand Sit to Stand: Min guard         General transfer comment: Hands-on guarding as pt powered up to full standing position. Pt extremely flexed and not making corrective changes to push up from the bed instead.  Ambulation/Gait Ambulation/Gait assistance: Min guard Gait Distance (Feet): 150 Feet Assistive device: Rolling walker (2 wheeled) Gait Pattern/deviations: Step-through pattern;Decreased stride length;Trunk flexed Gait velocity: Decreased Gait velocity interpretation: <1.31 ft/sec, indicative of household ambulator General Gait Details: VC's for improved posture, closer walker proximity, and forward gaze. Hands-on guarding provided throughout. Pt complaining of fatigue and pain through UE's, and was cued for increased reliance on LE's and less weight through UE's on walker.  Stairs            Wheelchair Mobility    Modified Rankin (Stroke Patients Only)       Balance Overall balance assessment: Needs assistance Sitting-balance support: Feet supported Sitting balance-Leahy Scale: Good Sitting balance - Comments: Sat EOB and donned socks with min guard for safety   Standing balance support: Bilateral upper extremity supported Standing balance-Leahy Scale: Poor Standing balance comment: needing RW for support, No LOB                             Pertinent Vitals/Pain Pain Assessment: Faces Pain Score: 7  Faces Pain Scale: Hurts whole lot Pain Location: Back Pain Descriptors / Indicators: Discomfort Pain Intervention(s): Limited activity within patient's tolerance;Monitored during session;Repositioned    Home Living Family/patient expects to be discharged to:: Private residence Living Arrangements: Alone Available  Help at Discharge: Neighbor;Available PRN/intermittently Type of Home:  Apartment Home Access: Stairs to enter Entrance Stairs-Rails: Can reach both Entrance Stairs-Number of Steps: 20 Home Layout: Other (Comment) (Lives on 2nd floor apartment) Home Equipment: Walker - 2 wheels;Walker - 4 wheels;Tub bench;Hand held shower head Additional Comments: Reports that neighbors and family can assist if needed. Neighbor lives across the hall    Prior Function Level of Independence: Independent with assistive device(s)         Comments: Pt reports she was using 2 wheeled walker PTA. Independent in ADLs, on disability, does all of her own cooking/cleaning. Also reports she was getting home health, pt unsure if it was therapy.     Hand Dominance   Dominant Hand: Left    Extremity/Trunk Assessment   Upper Extremity Assessment Upper Extremity Assessment: Defer to OT evaluation    Lower Extremity Assessment Lower Extremity Assessment: Generalized weakness (Consistent with pre-op diagnosis)    Cervical / Trunk Assessment Cervical / Trunk Assessment: Other exceptions Cervical / Trunk Exceptions: s/p lumbar surgery  Communication   Communication: HOH  Cognition Arousal/Alertness: Awake/alert Behavior During Therapy: WFL for tasks assessed/performed Overall Cognitive Status: Within Functional Limits for tasks assessed                                 General Comments: Pt able to follow multistep commands      General Comments      Exercises     Assessment/Plan    PT Assessment Patient needs continued PT services  PT Problem List Decreased strength;Decreased activity tolerance;Decreased balance;Decreased mobility;Decreased knowledge of use of DME;Decreased safety awareness;Decreased knowledge of precautions;Pain       PT Treatment Interventions DME instruction;Gait training;Stair training;Functional mobility training;Therapeutic activities;Therapeutic exercise;Neuromuscular re-education;Patient/family education    PT Goals (Current  goals can be found in the Care Plan section)  Acute Rehab PT Goals Patient Stated Goal: To get back home, to walk better PT Goal Formulation: With patient Time For Goal Achievement: 07/12/21 Potential to Achieve Goals: Good    Frequency Min 5X/week   Barriers to discharge        Co-evaluation               AM-PAC PT "6 Clicks" Mobility  Outcome Measure Help needed turning from your back to your side while in a flat bed without using bedrails?: A Little Help needed moving from lying on your back to sitting on the side of a flat bed without using bedrails?: A Little Help needed moving to and from a bed to a chair (including a wheelchair)?: A Little Help needed standing up from a chair using your arms (e.g., wheelchair or bedside chair)?: A Little Help needed to walk in hospital room?: A Little Help needed climbing 3-5 steps with a railing? : A Little 6 Click Score: 18    End of Session Equipment Utilized During Treatment: Gait belt Activity Tolerance: Patient tolerated treatment well Patient left: in chair;with call bell/phone within reach Nurse Communication: Mobility status PT Visit Diagnosis: Unsteadiness on feet (R26.81);Pain Pain - part of body:  (back)    Time: 6606-3016 PT Time Calculation (min) (ACUTE ONLY): 24 min   Charges:   PT Evaluation $PT Eval Low Complexity: 1 Low PT Treatments $Gait Training: 8-22 mins        Rolinda Roan, PT, DPT Acute Rehabilitation Services Pager: [phone] Office: [phone]   Thelma Comp 07/05/2021, 10:36 AM

## 2021-07-05 NOTE — Progress Notes (Signed)
Subjective: The patient is alert and pleasant.  Her back is appropriately sore.  She would like to stay until tomorrow.  Objective: Vital signs in last 24 hours: Temp:  [97.6 F (36.4 C)-99 F (37.2 C)] 98 F (36.7 C) (09/30 0716) Pulse Rate:  [77-100] 100 (09/30 0716) Resp:  [13-31] 18 (09/30 0716) BP: (94-133)/(52-69) 97/52 (09/30 0716) SpO2:  [93 %-100 %] 97 % (09/30 0716) Estimated body mass index is 31.64 kg/m as calculated from the following:   Height as of this encounter: 5' 2"  (1.575 m).   Weight as of this encounter: 78.5 kg.   Intake/Output from previous day: 09/29 0701 - 09/30 0700 In: 2100 [I.V.:2000; IV Piggyback:100] Out: 395 [Urine:245; Blood:150] Intake/Output this shift: No intake/output data recorded.  Physical exam the patient is alert and pleasant.  Her dressing is clean and dry.  Her strength is normal.  Lab Results: Recent Labs    07/05/21 0419  WBC 10.0  HGB 10.7*  HCT 33.6*  PLT 152   BMET Recent Labs    07/05/21 0419  NA 138  K 4.0  CL 111  CO2 20*  GLUCOSE 230*  BUN 10  CREATININE 1.02*  CALCIUM 7.5*    Studies/Results: DG Lumbar Spine 2-3 Views  Result Date: 07/04/2021 CLINICAL DATA:  Status post L4-L5 PLIF EXAM: LUMBAR SPINE - 2-3 VIEW COMPARISON:  Preoperative radiographs dated 01/22/2021 FINDINGS: Two C-arm fluoroscopic images were obtained intraoperatively and submitted for post operative interpretation. The patient is status post L4-L5 posterior instrumented fusion with interbody spacer placement. Hardware alignment is within expected limits, without evidence of immediate complication. Fluoro time 12 seconds, dose 6 mGy. Please see the performing provider's procedural report for further detail. IMPRESSION: Status post L4-L5 posterior instrumented fusion as above. Electronically Signed   By: Valetta Mole M.D.   On: 07/04/2021 14:10   DG Lumbar Spine 1 View  Result Date: 07/04/2021 CLINICAL DATA:  Intraoperative localization  EXAM: LUMBAR SPINE - 1 VIEW COMPARISON:  01/22/2021 FINDINGS: Cross-table lateral view of the lumbar spine demonstrates surgical instrumentation posterior to the L5 vertebral body overlying the L5 spinous process. Minimal grade 1 anterolisthesis of L4 on L5. IMPRESSION: 1. Intraoperative localization as above. Electronically Signed   By: Randa Ngo M.D.   On: 07/04/2021 15:17    Assessment/Plan: Postop day #1: The patient is doing well.  We will continue to mobilize her.  She will likely go home tomorrow.  I gave her discharge instructions and answered all her questions.  LOS: 1 day     [name] 07/05/2021, 8:43 AM     Patient ID: [name], female   DOB: 1961-01-15, 60 y.o.   MRN: [id number]

## 2021-07-05 NOTE — Evaluation (Signed)
Occupational Therapy Evaluation Patient Details Name: [name] MRN: [id number] DOB: 06-14-1961 Today's Date: 07/05/2021   History of Present Illness 60 y.o. female s/p L4-5 fusion, PMH of meneire's disease, DM II, HTN, and anxiety disorder.   Clinical Impression   PTA, pt was living alone and independent in ADLs/IADLs with use of 2 wheeled RW. Pt currently requiring set up for UB ADLs, Min guard for LB ADLs, and Min guard for functional mobility with RW. Pt educated on compensatory ADL and fucntional mobility strategies to adhere to back precautions, verbalized and demonstrated understanding of each. Required min verbal cues throughout to adhere to back precautions, verbalized 3/3. Due to pt decreased functional strength, activity tolerance, and safety recommending HHOT upon d/c. All education completed in preparation for d/c today.      Recommendations for follow up therapy are one component of a multi-disciplinary discharge planning process, led by the attending physician.  Recommendations may be updated based on patient status, additional functional criteria and insurance authorization.   Follow Up Recommendations  Supervision - Intermittent;Home health OT    Equipment Recommendations  None recommended by OT;3 in 1 bedside commode    Recommendations for Other Services       Precautions / Restrictions Precautions Precautions: Back Precaution Booklet Issued: Yes (comment) Precaution Comments: Verbally reviewed and given handout Required Braces or Orthoses: Spinal Brace Spinal Brace: Lumbar corset Restrictions Weight Bearing Restrictions: No      Mobility Bed Mobility Overal bed mobility: Modified Independent             General bed mobility comments: Pt educated on log rolling, demonstrated with no verbal cues    Transfers Overall transfer level: Needs assistance Equipment used: Rolling walker (2 wheeled) Transfers: Sit to/from Stand           General  transfer comment: min guard for sit to stand for safety, slow to rise    Balance Overall balance assessment: Needs assistance Sitting-balance support: Feet supported Sitting balance-Leahy Scale: Good Sitting balance - Comments: Sat EOB and donned socks with min guard for safety   Standing balance support: Bilateral upper extremity supported Standing balance-Leahy Scale: Poor Standing balance comment: needing RW for support, No LOBs                           ADL either performed or assessed with clinical judgement   ADL Overall ADL's : Needs assistance/impaired Eating/Feeding: Modified independent   Grooming: Wash/dry hands;Min guard;Standing Grooming Details (indicate cue type and reason): Washed hands standing at sink with min guard for safety Upper Body Bathing: Set up;Sitting   Lower Body Bathing: Sit to/from stand;Min guard   Upper Body Dressing : Set up;Sitting Upper Body Dressing Details (indicate cue type and reason): Pt educated on correct perscribed wear for back brace, set up for placing correctly. Lower Body Dressing: Min guard;Sit to/from stand;Cueing for safety Lower Body Dressing Details (indicate cue type and reason): Donned underwear EOB using figure four position with min guard for safety Toilet Transfer: RW;Min Psychiatric nurse Details (indicate cue type and reason): For safety, used grab bar to push self up into standing. Toileting- Clothing Manipulation and Hygiene: Sit to/from stand;Min guard Toileting - Clothing Manipulation Details (indicate cue type and reason): for safety, one hand on RW Tub/ Shower Transfer: Min guard;Rolling walker;Ambulation Tub/Shower Transfer Details (indicate cue type and reason): required min guard for safety while sidestepping over simulated tub ledge. Educated on correct safe  technique. Functional mobility during ADLs: Min guard;Rolling walker General ADL Comments: Pt educated on compensatory ADL and fucntional  mobility strategies to adhere to back precautions. with decreased functional strength and activity tolerance limiting ability to perform ADLs safely and independently. Min verbal cues througout to adhere to precautions.     Vision Baseline Vision/History: 1 Wears glasses Ability to See in Adequate Light: 0 Adequate Patient Visual Report: No change from baseline       Perception     Praxis      Pertinent Vitals/Pain Pain Assessment: 0-10 Pain Score: 7  Pain Location: Back Pain Descriptors / Indicators: Discomfort Pain Intervention(s): Limited activity within patient's tolerance     Hand Dominance Left   Extremity/Trunk Assessment Upper Extremity Assessment Upper Extremity Assessment: Generalized weakness   Lower Extremity Assessment Lower Extremity Assessment: Defer to PT evaluation   Cervical / Trunk Assessment Cervical / Trunk Assessment: Other exceptions (s/p lumbar surgery) Cervical / Trunk Exceptions: s/p lumbar surgery   Communication Communication Communication: HOH   Cognition Arousal/Alertness: Awake/alert Behavior During Therapy: WFL for tasks assessed/performed Overall Cognitive Status: Within Functional Limits for tasks assessed                                 General Comments: Pt able to follow multistep commands   General Comments       Exercises     Shoulder Instructions      Home Living Family/patient expects to be discharged to:: Private residence Living Arrangements: Alone Available Help at Discharge: Neighbor;Available PRN/intermittently Type of Home: Apartment Home Access: Stairs to enter Entrance Stairs-Number of Steps: 20 Entrance Stairs-Rails: Can reach both Home Layout: Other (Comment) (Lives on 2nd floor apartment)     Bathroom Shower/Tub: Tub/shower unit         Home Equipment: Environmental consultant - 2 wheels;Walker - 4 wheels;Tub bench;Hand held shower head   Additional Comments: Reports that neighbors and family can  assist if needed. Neighbor lives across the hall      Prior Functioning/Environment Level of Independence: Independent with assistive device(s)        Comments: Pt reports she was using 2 wheeled walker PTA. Independent in ADLs, on disability, does all of her own cooking/cleaning. Also reports she was getting home health, pt unsure if it was therapy.        OT Problem List: Decreased strength;Decreased range of motion;Decreased activity tolerance;Pain      OT Treatment/Interventions:      OT Goals(Current goals can be found in the care plan section) Acute Rehab OT Goals Patient Stated Goal: To get back home, to walk better OT Goal Formulation: With patient  OT Frequency:     Barriers to D/C:            Co-evaluation              AM-PAC OT "6 Clicks" Daily Activity     Outcome Measure Help from another person eating meals?: None Help from another person taking care of personal grooming?: A Little Help from another person toileting, which includes using toliet, bedpan, or urinal?: A Little Help from another person bathing (including washing, rinsing, drying)?: A Little Help from another person to put on and taking off regular upper body clothing?: A Little Help from another person to put on and taking off regular lower body clothing?: A Little 6 Click Score: 19   End of Session Equipment Utilized During  Treatment: Gait belt;Rolling walker;Back brace Nurse Communication: Mobility status (d/c recommendations)  Activity Tolerance: No increased pain Patient left: in bed;with bed alarm set;with call bell/phone within reach  OT Visit Diagnosis: Unsteadiness on feet (R26.81);Other abnormalities of gait and mobility (R26.89);Muscle weakness (generalized) (M62.81);Pain                Time: 1607-6066 OT Time Calculation (min): 26 min Charges:  OT General Charges $OT Visit: 1 Visit OT Evaluation $OT Eval Low Complexity: 1 Low OT Treatments $Self Care/Home Management :  8-22 mins  Jackquline Denmark, OTS Acute Rehab Office: [phone]   Arieana Somoza 07/05/2021, 9:31 AM

## 2021-07-06 LAB — GLUCOSE, CAPILLARY: Glucose-Capillary: 146 mg/dL — ABNORMAL HIGH (ref 70–99)

## 2021-07-06 MED ORDER — DOCUSATE SODIUM 100 MG PO CAPS: 100.0000 mg | ORAL_CAPSULE | Freq: Two times a day (BID) | ORAL | 2 refills | Status: AC

## 2021-07-06 MED ORDER — OXYCODONE HCL 5 MG PO TABS: 5.0000 mg | ORAL_TABLET | ORAL | 0 refills | Status: AC | PRN

## 2021-07-06 NOTE — Progress Notes (Addendum)
Physical Therapy Treatment Patient Details Name: [name] MRN: [id number] DOB: [birth year] Today's Date: 07/06/2021   History of Present Illness Pt is a 60 y/o female who presents s/p L4-5 PLIF on 07/04/2021, PMH of meneire's disease, DM II, HTN, and anxiety disorder.    PT Comments    Pt progressing well with post-op mobility. She was able to demonstrate transfers and ambulation with gross min guard assist to min assist and RW for support. She was able to participate in stair training, and although was very fatigued after, was able to negotiate 10 stairs with min guard assist. Reinforced education on precautions, brace application/wearing schedule, appropriate activity progression, and car transfer. Will continue to follow.      Recommendations for follow up therapy are one component of a multi-disciplinary discharge planning process, led by the attending physician.  Recommendations may be updated based on patient status, additional functional criteria and insurance authorization.  Follow Up Recommendations  Home health PT;Supervision for mobility/OOB     Equipment Recommendations  Rolling walker with 5" wheels;3in1 (PT)    Recommendations for Other Services       Precautions / Restrictions Precautions Precautions: Back Precaution Booklet Issued: Yes (comment) Precaution Comments: Reviewed handout and pt was cued for precautions during functional mobility. Required Braces or Orthoses: Spinal Brace Spinal Brace: Lumbar corset;Applied in sitting position Restrictions Weight Bearing Restrictions: No     Mobility  Bed Mobility Overal bed mobility: Needs Assistance Bed Mobility: Rolling;Sit to Sidelying Rolling: Modified independent (Device/Increase time)       Sit to sidelying: Min guard General bed mobility comments: Pt was received sitting EOB at beginning of session. HOB flat and rails lowered to simulate home environment. Pt was able to elevate LE's up into bed with  min guard assist - had to do 1 at a time.    Transfers Overall transfer level: Needs assistance Equipment used: Rolling walker (2 wheeled) Transfers: Sit to/from Stand Sit to Stand: Min guard         General transfer comment: Hands-on guarding provided as pt powered up to full stand. VC's for hand placement on seated surface for safety.  Ambulation/Gait Ambulation/Gait assistance: Min guard Gait Distance (Feet): 75 Feet Assistive device: Rolling walker (2 wheeled) Gait Pattern/deviations: Step-through pattern;Decreased stride length;Trunk flexed Gait velocity: Decreased Gait velocity interpretation: <1.31 ft/sec, indicative of household ambulator General Gait Details: VC's for improved posture, closer walker proximity, and forward gaze. Hands-on guarding provided throughout. Pt complaining of fatigue and pain through UE's, and was cued for increased reliance on LE's and less weight through UE's on walker.   Stairs Stairs: Yes Stairs assistance: Min guard;Min assist Stair Management: Two rails;Step to pattern;Forwards Number of Stairs: 10 General stair comments: VC's for sequencing and general safety. Occasional assist required for balance and advancement to next step. Pt very fatigued by end of stair training   Wheelchair Mobility    Modified Rankin (Stroke Patients Only)       Balance Overall balance assessment: Needs assistance Sitting-balance support: Feet supported Sitting balance-Leahy Scale: Fair Sitting balance - Comments: posterior lean with donning brace   Standing balance support: Bilateral upper extremity supported Standing balance-Leahy Scale: Poor Standing balance comment: Reliant on UE support                            Cognition Arousal/Alertness: Awake/alert Behavior During Therapy: WFL for tasks assessed/performed Overall Cognitive Status: Within Functional Limits for tasks assessed  General Comments: Pt able to follow multistep commands      Exercises      General Comments        Pertinent Vitals/Pain Pain Assessment: Faces Faces Pain Scale: Hurts whole lot Pain Location: Back Pain Descriptors / Indicators: Discomfort Pain Intervention(s): Limited activity within patient's tolerance;Monitored during session;Repositioned    Home Living Family/patient expects to be discharged to:: Private residence Living Arrangements: Alone Available Help at Discharge: Neighbor;Available PRN/intermittently Type of Home: Apartment Home Access: Stairs to enter Entrance Stairs-Rails: Can reach both Home Layout: Other (Comment) (Lives on 2nd floor apartment) Home Equipment: Walker - 2 wheels;Walker - 4 wheels;Tub bench;Hand held shower head Additional Comments: Reports that neighbors and family can assist if needed. Neighbor lives across the hall    Prior Function Level of Independence: Independent with assistive device(s)      Comments: Pt reports she was using 2 wheeled walker PTA. Independent in ADLs, on disability, does all of her own cooking/cleaning. Also reports she was getting home health, pt unsure if it was therapy.   PT Goals (current goals can now be found in the care plan section) Acute Rehab PT Goals Patient Stated Goal: To get back home, to walk better PT Goal Formulation: With patient Time For Goal Achievement: 07/12/21 Potential to Achieve Goals: Good Progress towards PT goals: Progressing toward goals    Frequency    Min 5X/week      PT Plan Current plan remains appropriate    Co-evaluation              AM-PAC PT "6 Clicks" Mobility   Outcome Measure  Help needed turning from your back to your side while in a flat bed without using bedrails?: A Little Help needed moving from lying on your back to sitting on the side of a flat bed without using bedrails?: A Little Help needed moving to and from a bed to a chair (including a  wheelchair)?: A Little Help needed standing up from a chair using your arms (e.g., wheelchair or bedside chair)?: A Little Help needed to walk in hospital room?: A Little Help needed climbing 3-5 steps with a railing? : A Little 6 Click Score: 18    End of Session Equipment Utilized During Treatment: Gait belt Activity Tolerance: Patient tolerated treatment well Patient left: in chair;with call bell/phone within reach Nurse Communication: Mobility status PT Visit Diagnosis: Unsteadiness on feet (R26.81);Pain Pain - part of body:  (back)     Time: 0634-9494 PT Time Calculation (min) (ACUTE ONLY): 31 min  Charges:  $Gait Training: 23-37 mins                     Rolinda Roan, PT, DPT Acute Rehabilitation Services Pager: [phone] Office: [phone]    Thelma Comp 07/06/2021, 9:59 AM

## 2021-07-06 NOTE — Discharge Summary (Signed)
Physician Discharge Summary  Patient ID: [name] MRN: [id number] DOB/AGE: [birth year] 59 y.o.  Admit date: 07/04/2021 Discharge date: 07/06/2021  Admission Diagnoses:  Lumbar spondylolisthesis  Discharge Diagnoses:  Same Active Problems:   Spondylolisthesis, lumbar region   Discharged Condition: Stable  Hospital Course:  [name] is a 60 y.o. female who was admitted to the hospital after elective L4-5 TLIF.  He was admitted to the spine unit postoperatively.  There, she was mobilized with the help of physical therapy, Occupational Therapy.  Her pain was well controlled with p.o. pain medications and she is voiding and eating without difficulty.  She was deemed ready for discharge home on 07/06/2021 with home health services.  Treatments: Surgery -L4-5 TLIF  Discharge Exam: Blood pressure 110/62, pulse 89, temperature (!) 97.5 F (36.4 C), temperature source Oral, resp. rate 18, height 5' 2"  (1.575 m), weight 78.5 kg, SpO2 98 %. Awake, alert, oriented Speech fluent, appropriate CN grossly intact 5/5 BUE/BLE Wound c/d/i  Disposition: Discharge disposition: 06-Home-Health Care Svc        Allergies as of 07/06/2021       Reactions   Flagyl [metronidazole Hcl] Itching, Rash   Nsaids Nausea Only        Medication List     STOP taking these medications    HYDROcodone-acetaminophen 7.5-325 MG tablet Commonly known as: NORCO       TAKE these medications    acetaZOLAMIDE 250 MG tablet Commonly known as: DIAMOX Take 250 mg by mouth daily.   acyclovir 400 MG tablet Commonly known as: ZOVIRAX Take 400 mg by mouth at bedtime.   albuterol (2.5 MG/3ML) 0.083% nebulizer solution Commonly known as: PROVENTIL Take 2.5 mg by nebulization every 4 (four) hours as needed for shortness of breath.   aspirin EC 81 MG tablet Take 1 tablet (81 mg total) by mouth daily. Swallow whole.   atorvastatin 20 MG tablet Commonly known as: LIPITOR TAKE ONE TABLET BY  MOUTH DAILY (MORNING PER PT)   azelastine 0.05 % ophthalmic solution Commonly known as: OPTIVAR Place 1 drop into both eyes daily.   baclofen 10 MG tablet Commonly known as: LIORESAL Take 10 mg by mouth 2 (two) times daily as needed for muscle spasms.   cyanocobalamin 1000 MCG/ML injection Commonly known as: (VITAMIN B-12) Inject 1,000 mcg into the muscle every 30 (thirty) days.   diazepam 5 MG tablet Commonly known as: VALIUM Take 5 mg by mouth every 12 (twelve) hours as needed for anxiety.   diclofenac Sodium 1 % Gel Commonly known as: VOLTAREN Apply 2 g topically at bedtime as needed (pain).   dicyclomine 20 MG tablet Commonly known as: BENTYL Take 1 tablet (20 mg total) by mouth 3 (three) times daily. What changed: when to take this   docusate sodium 100 MG capsule Commonly known as: COLACE Take 1 capsule (100 mg total) by mouth 2 (two) times daily.   DULoxetine 60 MG capsule Commonly known as: CYMBALTA Take 60 mg by mouth 2 (two) times daily.   estradiol 0.5 MG tablet Commonly known as: ESTRACE Take 0.5 mg by mouth every morning.   fluticasone 50 MCG/ACT nasal spray Commonly known as: FLONASE Place 2 sprays into both nostrils every morning.   FreeStyle Libre 14 Day Sensor Misc Inject 1 each into the skin every 14 (fourteen) days. Use as directed.   glucose blood test strip Commonly known as: FREESTYLE LITE Use as instructed   insulin aspart 100 UNIT/ML injection Commonly known as:  NovoLOG Inject 10-16 Units into the skin 3 (three) times daily with meals. What changed:  how much to take when to take this   Insulin Syringe-Needle U-100 31G X 5/16" 1 ML Misc Commonly known as: Easy Touch Insulin Syringe Use as directed to inject insulin four times daily   levothyroxine 100 MCG tablet Commonly known as: SYNTHROID TAKE ONE TABLET BY MOUTH DAILY BEFORE BREAKFAST What changed: See the new instructions.   lisinopril 20 MG tablet Commonly known as:  ZESTRIL Take 20 mg by mouth daily.   metFORMIN 500 MG tablet Commonly known as: GLUCOPHAGE TAKE ONE TABLET BY MOUTH TWICE DAILY. TAKE WITH A MEAL. (MORNING ,EVENING)   metoprolol tartrate 50 MG tablet Commonly known as: LOPRESSOR Take 1 tablet (50 mg total) by mouth 2 (two) times daily.   mupirocin ointment 2 % Commonly known as: BACTROBAN Apply 1 application topically 3 (three) times daily as needed (diabetic sores).   ondansetron 4 MG tablet Commonly known as: ZOFRAN Take 1 tablet (4 mg total) by mouth every 8 (eight) hours as needed for nausea or vomiting.   oxyCODONE 5 MG immediate release tablet Commonly known as: Oxy IR/ROXICODONE Take 1 tablet (5 mg total) by mouth every 4 (four) hours as needed for moderate pain ((score 4 to 6)).   pantoprazole 40 MG tablet Commonly known as: PROTONIX Take 40 mg by mouth daily before breakfast.   PARoxetine 40 MG tablet Commonly known as: PAXIL Take 40 mg by mouth daily.   Tresiba 100 UNIT/ML Soln Generic drug: Insulin Degludec Inject 50 Units into the skin at bedtime. What changed: how much to take               Durable Medical Equipment  (From admission, onward)           Start     Ordered   07/05/21 1148  For home use only DME Walker rolling  Once       Question Answer Comment  Walker: With [place] Wheels   Patient needs a walker to treat with the following condition Unsteady gait when walking      07/05/21 1148             Signed: Vallarie Mare 07/06/2021, 9:28 AM

## 2021-07-06 NOTE — Progress Notes (Signed)
Patient was transported via wheelchair by NT and her belongings in a cart by RN for discharge to home; with complaints of moderate pain on her back and was medicated before leaving the unit; honeycomb dressing on her back was clean, dry and intact; room was checked and accounted for all her belongings; discharge instructions concerning her medications, wound care, follow-up appointment and when to call the doctor as needed were all discussed with the patient by RN and she verbalized understanding on the instructions given.

## 2021-07-06 NOTE — TOC Transition Note (Signed)
Transition of Care [facility]) - CM/SW Discharge Note   Patient Details  Name: MERTIS MOSHER MRN: [id number] Date of Birth: 04/28/1961  Transition of [facility]) CM/SW Contact:  Carles Collet, RN Phone Number: 07/06/2021, 9:40 AM   Clinical Narrative:    Damaris Schooner w patient at bedside. She is agreeable to PT recs for [facility]. Orders for PT OT [facility]HA, patient does not have preference for [facility] provider.  Referral accepted by [facility]. Unit to provide DME needs    Final next level of care: Home w Home Health Services Barriers to Discharge: No Barriers Identified   Patient Goals and CMS Choice Patient states their goals for this hospitalization and ongoing recovery are:: to go home CMS Medicare.gov Compare Post Acute Care list provided to:: Patient Choice offered to / list presented to : Patient  Discharge Placement                       Discharge Plan and Services                          HH Arranged: PT, OT, Nurse's Aide HH Agency: [place] Date [facility] Agency Contacted: 07/06/21 Time [place]: [phone] Representative spoke with at [place]: [place] ([place]) Interventions     Readmission Risk Interventions No flowsheet data found.

## 2021-07-06 NOTE — Discharge Instructions (Signed)
Wound Care Keep incision covered and dry for two days.  If you shower, cover incision with plastic wrap.  Do not put any creams, lotions, or ointments on incision. Leave steri-strips on back.  They will fall off by themselves.  Activity Walk each and every day, increasing distance each day. No lifting greater than 5 lbs.  Avoid excessive neck motion. No driving for 2 weeks; may ride as a passenger locally. If provided with back brace, wear when out of bed.  It is not necessary to wear brace in bed. Diet Resume your normal diet.  . Call Your Doctor If Any of These Occur Redness, drainage, or swelling at the wound.  Temperature greater than 101 degrees. Severe pain not relieved by pain medication. Incision starts to come apart. Follow Up Appt Call today for appointment in 1-2 weeks ([phone]) or for problems.  If you have any hardware placed in your spine, you will need an x-ray before your appointment.

## 2021-07-08 DIAGNOSIS — M810 Age-related osteoporosis without current pathological fracture: Secondary | ICD-10-CM | POA: Diagnosis not present

## 2021-07-08 DIAGNOSIS — Z7982 Long term (current) use of aspirin: Secondary | ICD-10-CM | POA: Diagnosis not present

## 2021-07-08 DIAGNOSIS — Z794 Long term (current) use of insulin: Secondary | ICD-10-CM | POA: Diagnosis not present

## 2021-07-08 DIAGNOSIS — F419 Anxiety disorder, unspecified: Secondary | ICD-10-CM | POA: Diagnosis not present

## 2021-07-08 DIAGNOSIS — M1990 Unspecified osteoarthritis, unspecified site: Secondary | ICD-10-CM | POA: Diagnosis not present

## 2021-07-08 DIAGNOSIS — M48061 Spinal stenosis, lumbar region without neurogenic claudication: Secondary | ICD-10-CM | POA: Diagnosis not present

## 2021-07-08 DIAGNOSIS — K589 Irritable bowel syndrome without diarrhea: Secondary | ICD-10-CM | POA: Diagnosis not present

## 2021-07-08 DIAGNOSIS — F32A Depression, unspecified: Secondary | ICD-10-CM | POA: Diagnosis not present

## 2021-07-08 DIAGNOSIS — M4316 Spondylolisthesis, lumbar region: Secondary | ICD-10-CM | POA: Diagnosis not present

## 2021-07-08 DIAGNOSIS — Z79891 Long term (current) use of opiate analgesic: Secondary | ICD-10-CM | POA: Diagnosis not present

## 2021-07-08 DIAGNOSIS — Z4789 Encounter for other orthopedic aftercare: Secondary | ICD-10-CM | POA: Diagnosis not present

## 2021-07-08 DIAGNOSIS — E119 Type 2 diabetes mellitus without complications: Secondary | ICD-10-CM | POA: Diagnosis not present

## 2021-07-08 DIAGNOSIS — Z7984 Long term (current) use of oral hypoglycemic drugs: Secondary | ICD-10-CM | POA: Diagnosis not present

## 2021-07-08 DIAGNOSIS — Z981 Arthrodesis status: Secondary | ICD-10-CM | POA: Diagnosis not present

## 2021-07-10 DIAGNOSIS — M1990 Unspecified osteoarthritis, unspecified site: Secondary | ICD-10-CM | POA: Diagnosis not present

## 2021-07-10 DIAGNOSIS — Z4789 Encounter for other orthopedic aftercare: Secondary | ICD-10-CM | POA: Diagnosis not present

## 2021-07-10 DIAGNOSIS — M4316 Spondylolisthesis, lumbar region: Secondary | ICD-10-CM | POA: Diagnosis not present

## 2021-07-10 DIAGNOSIS — M48061 Spinal stenosis, lumbar region without neurogenic claudication: Secondary | ICD-10-CM | POA: Diagnosis not present

## 2021-07-10 DIAGNOSIS — E119 Type 2 diabetes mellitus without complications: Secondary | ICD-10-CM | POA: Diagnosis not present

## 2021-07-10 DIAGNOSIS — M810 Age-related osteoporosis without current pathological fracture: Secondary | ICD-10-CM | POA: Diagnosis not present

## 2021-07-17 DIAGNOSIS — M4316 Spondylolisthesis, lumbar region: Secondary | ICD-10-CM | POA: Diagnosis not present

## 2021-07-17 DIAGNOSIS — E119 Type 2 diabetes mellitus without complications: Secondary | ICD-10-CM | POA: Diagnosis not present

## 2021-07-17 DIAGNOSIS — M1990 Unspecified osteoarthritis, unspecified site: Secondary | ICD-10-CM | POA: Diagnosis not present

## 2021-07-17 DIAGNOSIS — Z4789 Encounter for other orthopedic aftercare: Secondary | ICD-10-CM | POA: Diagnosis not present

## 2021-07-17 DIAGNOSIS — M810 Age-related osteoporosis without current pathological fracture: Secondary | ICD-10-CM | POA: Diagnosis not present

## 2021-07-17 DIAGNOSIS — M48061 Spinal stenosis, lumbar region without neurogenic claudication: Secondary | ICD-10-CM | POA: Diagnosis not present

## 2021-07-19 DIAGNOSIS — M810 Age-related osteoporosis without current pathological fracture: Secondary | ICD-10-CM | POA: Diagnosis not present

## 2021-07-19 DIAGNOSIS — M48061 Spinal stenosis, lumbar region without neurogenic claudication: Secondary | ICD-10-CM | POA: Diagnosis not present

## 2021-07-19 DIAGNOSIS — E119 Type 2 diabetes mellitus without complications: Secondary | ICD-10-CM | POA: Diagnosis not present

## 2021-07-19 DIAGNOSIS — M1990 Unspecified osteoarthritis, unspecified site: Secondary | ICD-10-CM | POA: Diagnosis not present

## 2021-07-19 DIAGNOSIS — M4316 Spondylolisthesis, lumbar region: Secondary | ICD-10-CM | POA: Diagnosis not present

## 2021-07-19 DIAGNOSIS — Z4789 Encounter for other orthopedic aftercare: Secondary | ICD-10-CM | POA: Diagnosis not present

## 2021-07-22 DIAGNOSIS — E7800 Pure hypercholesterolemia, unspecified: Secondary | ICD-10-CM | POA: Diagnosis not present

## 2021-07-22 DIAGNOSIS — I2510 Atherosclerotic heart disease of native coronary artery without angina pectoris: Secondary | ICD-10-CM | POA: Diagnosis not present

## 2021-07-22 DIAGNOSIS — E1165 Type 2 diabetes mellitus with hyperglycemia: Secondary | ICD-10-CM | POA: Diagnosis not present

## 2021-07-22 DIAGNOSIS — E039 Hypothyroidism, unspecified: Secondary | ICD-10-CM | POA: Diagnosis not present

## 2021-07-22 DIAGNOSIS — I10 Essential (primary) hypertension: Secondary | ICD-10-CM | POA: Diagnosis not present

## 2021-07-23 DIAGNOSIS — E119 Type 2 diabetes mellitus without complications: Secondary | ICD-10-CM | POA: Diagnosis not present

## 2021-07-23 DIAGNOSIS — M1990 Unspecified osteoarthritis, unspecified site: Secondary | ICD-10-CM | POA: Diagnosis not present

## 2021-07-23 DIAGNOSIS — M48061 Spinal stenosis, lumbar region without neurogenic claudication: Secondary | ICD-10-CM | POA: Diagnosis not present

## 2021-07-23 DIAGNOSIS — M4316 Spondylolisthesis, lumbar region: Secondary | ICD-10-CM | POA: Diagnosis not present

## 2021-07-23 DIAGNOSIS — Z4789 Encounter for other orthopedic aftercare: Secondary | ICD-10-CM | POA: Diagnosis not present

## 2021-07-23 DIAGNOSIS — M810 Age-related osteoporosis without current pathological fracture: Secondary | ICD-10-CM | POA: Diagnosis not present

## 2021-07-25 DIAGNOSIS — Z4789 Encounter for other orthopedic aftercare: Secondary | ICD-10-CM | POA: Diagnosis not present

## 2021-07-25 DIAGNOSIS — E119 Type 2 diabetes mellitus without complications: Secondary | ICD-10-CM | POA: Diagnosis not present

## 2021-07-25 DIAGNOSIS — M48061 Spinal stenosis, lumbar region without neurogenic claudication: Secondary | ICD-10-CM | POA: Diagnosis not present

## 2021-07-25 DIAGNOSIS — M4316 Spondylolisthesis, lumbar region: Secondary | ICD-10-CM | POA: Diagnosis not present

## 2021-07-25 DIAGNOSIS — M810 Age-related osteoporosis without current pathological fracture: Secondary | ICD-10-CM | POA: Diagnosis not present

## 2021-07-25 DIAGNOSIS — M1990 Unspecified osteoarthritis, unspecified site: Secondary | ICD-10-CM | POA: Diagnosis not present

## 2021-07-29 DIAGNOSIS — E11622 Type 2 diabetes mellitus with other skin ulcer: Secondary | ICD-10-CM | POA: Diagnosis not present

## 2021-07-29 DIAGNOSIS — L98411 Non-pressure chronic ulcer of buttock limited to breakdown of skin: Secondary | ICD-10-CM | POA: Diagnosis not present

## 2021-07-29 DIAGNOSIS — I10 Essential (primary) hypertension: Secondary | ICD-10-CM | POA: Diagnosis not present

## 2021-07-29 DIAGNOSIS — M4316 Spondylolisthesis, lumbar region: Secondary | ICD-10-CM | POA: Diagnosis not present

## 2021-07-29 DIAGNOSIS — M48061 Spinal stenosis, lumbar region without neurogenic claudication: Secondary | ICD-10-CM | POA: Diagnosis not present

## 2021-07-29 DIAGNOSIS — Z789 Other specified health status: Secondary | ICD-10-CM | POA: Diagnosis not present

## 2021-07-29 DIAGNOSIS — Z6832 Body mass index (BMI) 32.0-32.9, adult: Secondary | ICD-10-CM | POA: Diagnosis not present

## 2021-07-29 DIAGNOSIS — E1165 Type 2 diabetes mellitus with hyperglycemia: Secondary | ICD-10-CM | POA: Diagnosis not present

## 2021-07-29 DIAGNOSIS — M1990 Unspecified osteoarthritis, unspecified site: Secondary | ICD-10-CM | POA: Diagnosis not present

## 2021-07-29 DIAGNOSIS — Z4789 Encounter for other orthopedic aftercare: Secondary | ICD-10-CM | POA: Diagnosis not present

## 2021-07-29 DIAGNOSIS — E119 Type 2 diabetes mellitus without complications: Secondary | ICD-10-CM | POA: Diagnosis not present

## 2021-07-29 DIAGNOSIS — M810 Age-related osteoporosis without current pathological fracture: Secondary | ICD-10-CM | POA: Diagnosis not present

## 2021-07-29 DIAGNOSIS — Z299 Encounter for prophylactic measures, unspecified: Secondary | ICD-10-CM | POA: Diagnosis not present

## 2021-07-29 DIAGNOSIS — E1142 Type 2 diabetes mellitus with diabetic polyneuropathy: Secondary | ICD-10-CM | POA: Diagnosis not present

## 2021-07-30 DIAGNOSIS — M4316 Spondylolisthesis, lumbar region: Secondary | ICD-10-CM | POA: Diagnosis not present

## 2021-07-30 DIAGNOSIS — Z981 Arthrodesis status: Secondary | ICD-10-CM | POA: Diagnosis not present

## 2021-07-31 DIAGNOSIS — Z4789 Encounter for other orthopedic aftercare: Secondary | ICD-10-CM | POA: Diagnosis not present

## 2021-07-31 DIAGNOSIS — M1990 Unspecified osteoarthritis, unspecified site: Secondary | ICD-10-CM | POA: Diagnosis not present

## 2021-07-31 DIAGNOSIS — M810 Age-related osteoporosis without current pathological fracture: Secondary | ICD-10-CM | POA: Diagnosis not present

## 2021-07-31 DIAGNOSIS — M4316 Spondylolisthesis, lumbar region: Secondary | ICD-10-CM | POA: Diagnosis not present

## 2021-07-31 DIAGNOSIS — M48061 Spinal stenosis, lumbar region without neurogenic claudication: Secondary | ICD-10-CM | POA: Diagnosis not present

## 2021-07-31 DIAGNOSIS — E119 Type 2 diabetes mellitus without complications: Secondary | ICD-10-CM | POA: Diagnosis not present

## 2021-08-06 DIAGNOSIS — M48061 Spinal stenosis, lumbar region without neurogenic claudication: Secondary | ICD-10-CM | POA: Diagnosis not present

## 2021-08-06 DIAGNOSIS — M810 Age-related osteoporosis without current pathological fracture: Secondary | ICD-10-CM | POA: Diagnosis not present

## 2021-08-06 DIAGNOSIS — Z4789 Encounter for other orthopedic aftercare: Secondary | ICD-10-CM | POA: Diagnosis not present

## 2021-08-06 DIAGNOSIS — M4316 Spondylolisthesis, lumbar region: Secondary | ICD-10-CM | POA: Diagnosis not present

## 2021-08-06 DIAGNOSIS — E119 Type 2 diabetes mellitus without complications: Secondary | ICD-10-CM | POA: Diagnosis not present

## 2021-08-06 DIAGNOSIS — M1990 Unspecified osteoarthritis, unspecified site: Secondary | ICD-10-CM | POA: Diagnosis not present

## 2021-08-07 DIAGNOSIS — Z7984 Long term (current) use of oral hypoglycemic drugs: Secondary | ICD-10-CM | POA: Diagnosis not present

## 2021-08-07 DIAGNOSIS — F32A Depression, unspecified: Secondary | ICD-10-CM | POA: Diagnosis not present

## 2021-08-07 DIAGNOSIS — K589 Irritable bowel syndrome without diarrhea: Secondary | ICD-10-CM | POA: Diagnosis not present

## 2021-08-07 DIAGNOSIS — Z7982 Long term (current) use of aspirin: Secondary | ICD-10-CM | POA: Diagnosis not present

## 2021-08-07 DIAGNOSIS — F419 Anxiety disorder, unspecified: Secondary | ICD-10-CM | POA: Diagnosis not present

## 2021-08-07 DIAGNOSIS — M810 Age-related osteoporosis without current pathological fracture: Secondary | ICD-10-CM | POA: Diagnosis not present

## 2021-08-07 DIAGNOSIS — Z4789 Encounter for other orthopedic aftercare: Secondary | ICD-10-CM | POA: Diagnosis not present

## 2021-08-07 DIAGNOSIS — M48061 Spinal stenosis, lumbar region without neurogenic claudication: Secondary | ICD-10-CM | POA: Diagnosis not present

## 2021-08-07 DIAGNOSIS — Z981 Arthrodesis status: Secondary | ICD-10-CM | POA: Diagnosis not present

## 2021-08-07 DIAGNOSIS — M1990 Unspecified osteoarthritis, unspecified site: Secondary | ICD-10-CM | POA: Diagnosis not present

## 2021-08-07 DIAGNOSIS — E119 Type 2 diabetes mellitus without complications: Secondary | ICD-10-CM | POA: Diagnosis not present

## 2021-08-07 DIAGNOSIS — M4316 Spondylolisthesis, lumbar region: Secondary | ICD-10-CM | POA: Diagnosis not present

## 2021-08-07 DIAGNOSIS — Z79891 Long term (current) use of opiate analgesic: Secondary | ICD-10-CM | POA: Diagnosis not present

## 2021-08-07 DIAGNOSIS — Z794 Long term (current) use of insulin: Secondary | ICD-10-CM | POA: Diagnosis not present

## 2021-08-13 IMAGING — US US ABDOMEN COMPLETE
1 series · 13 of 25 positions shown · non-contrast
Comparison: Abdominal ultrasound 06/14/2019

CLINICAL DATA: Cirrhosis.  NAFLD.

EXAM:
ABDOMEN ULTRASOUND COMPLETE

[Series 1: us abdomen complete · 13 of 88 slices shown]
[im 1/88]
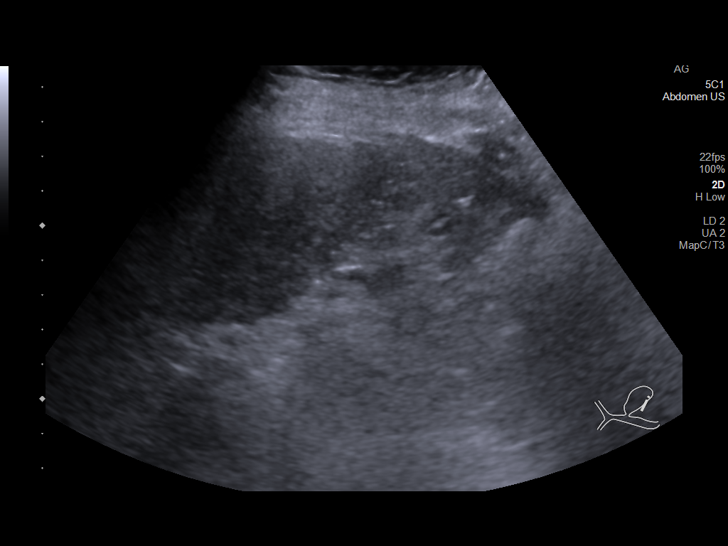
[im 8/88]
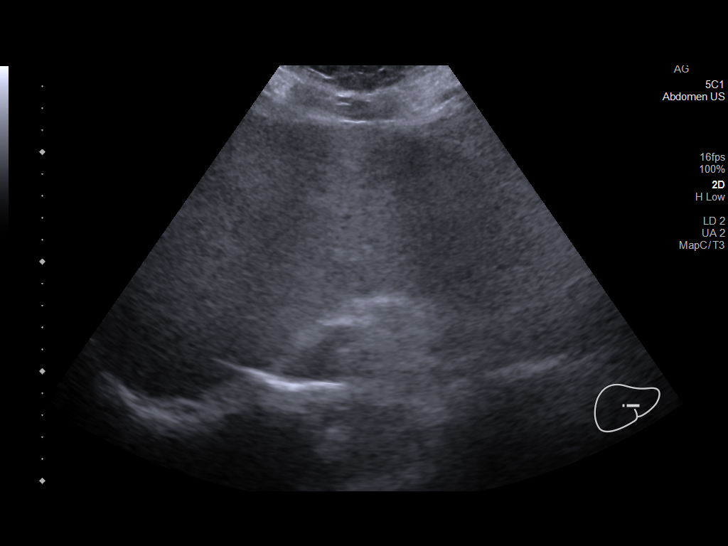
[im 15/88]
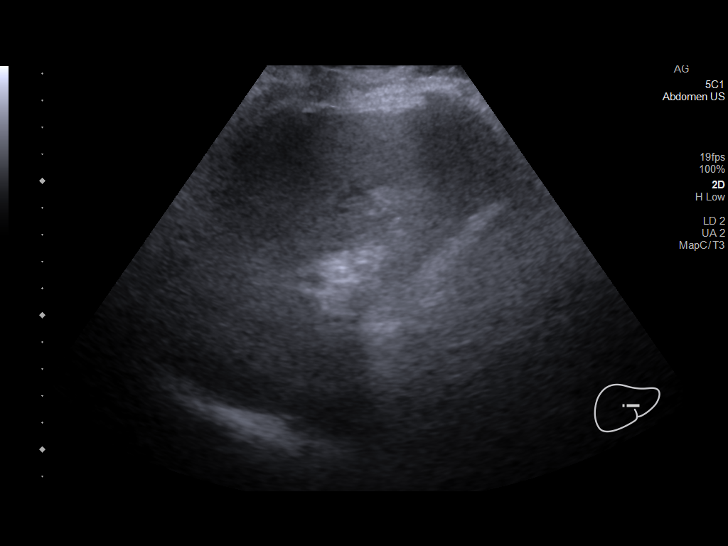
[im 22/88]
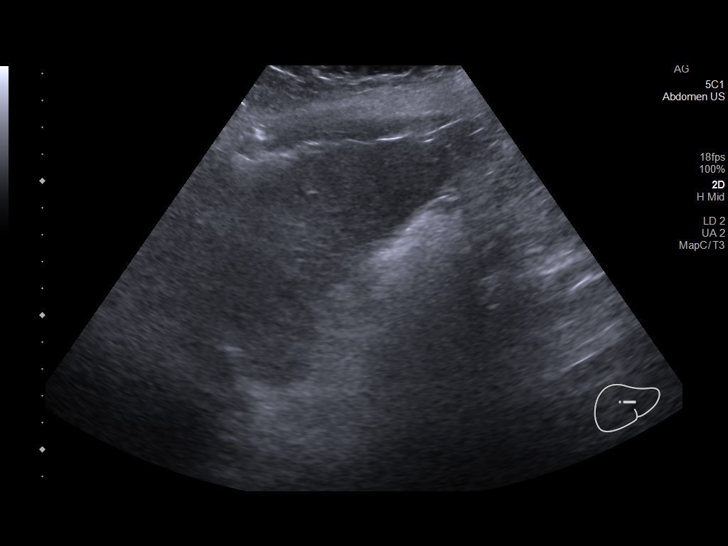
[im 30/88]
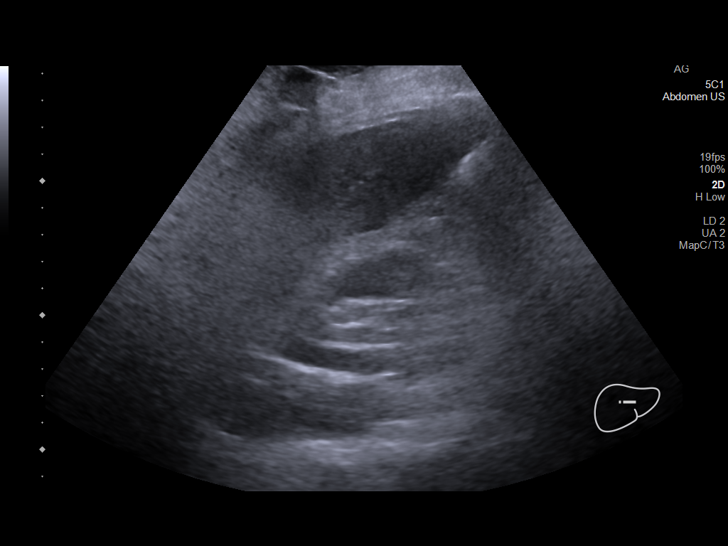
[im 37/88]
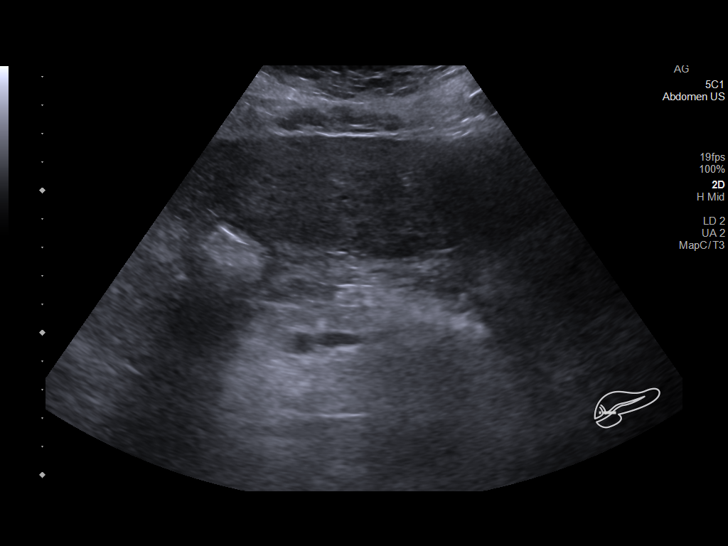
[im 44/88]
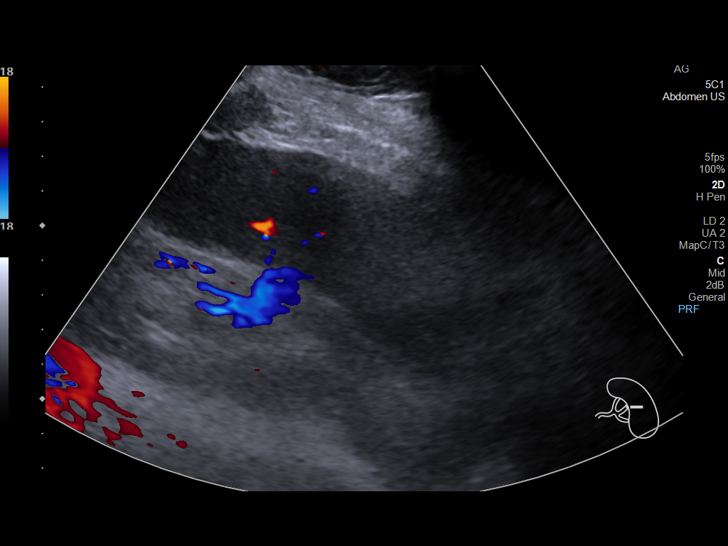
[im 51/88]
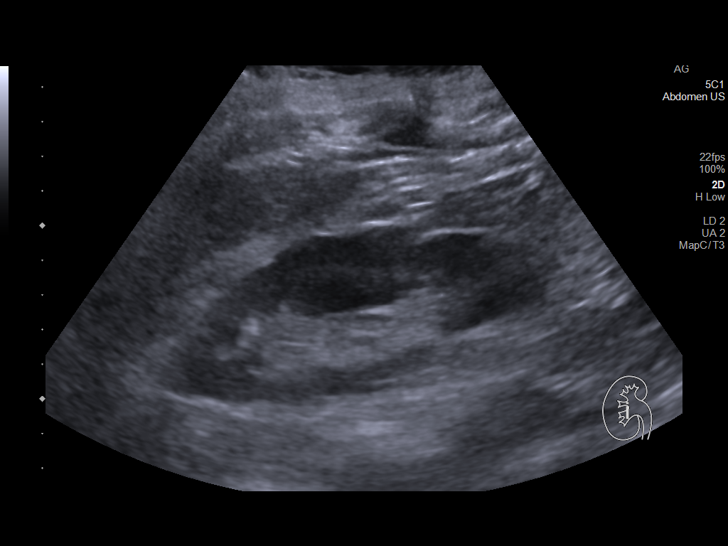
[im 59/88]
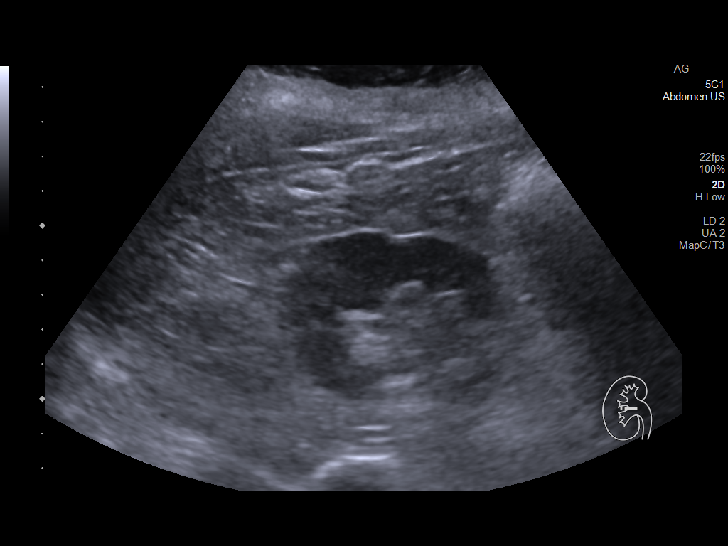
[im 66/88]
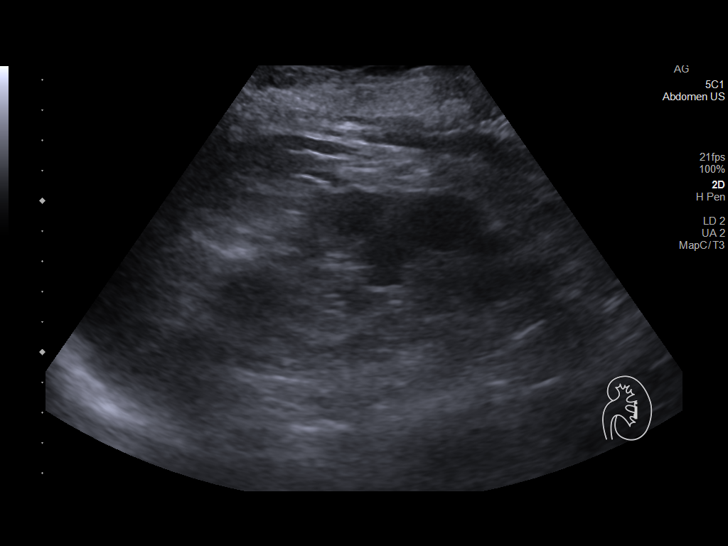
[im 73/88]
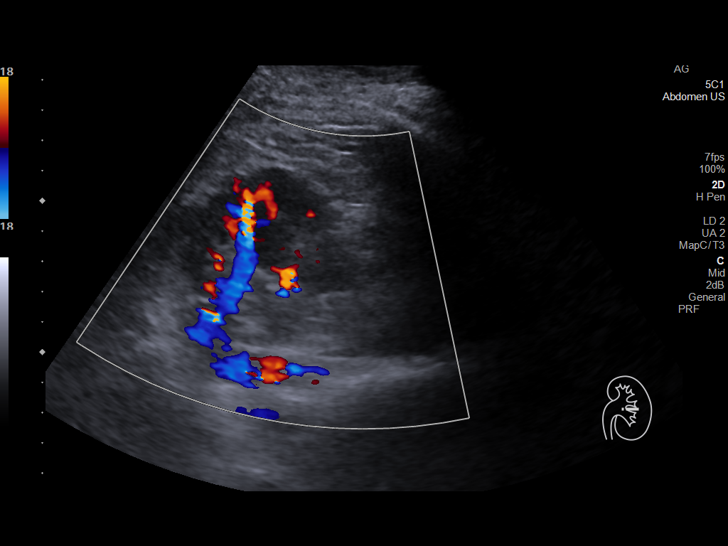
[im 80/88]
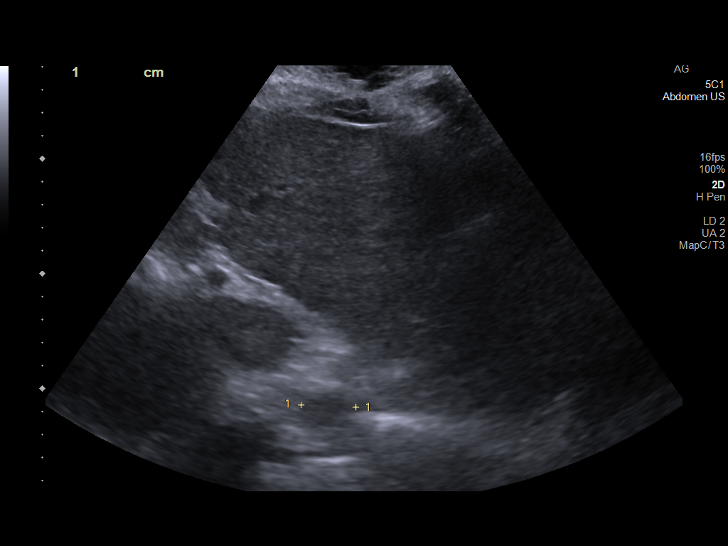
[im 88/88]
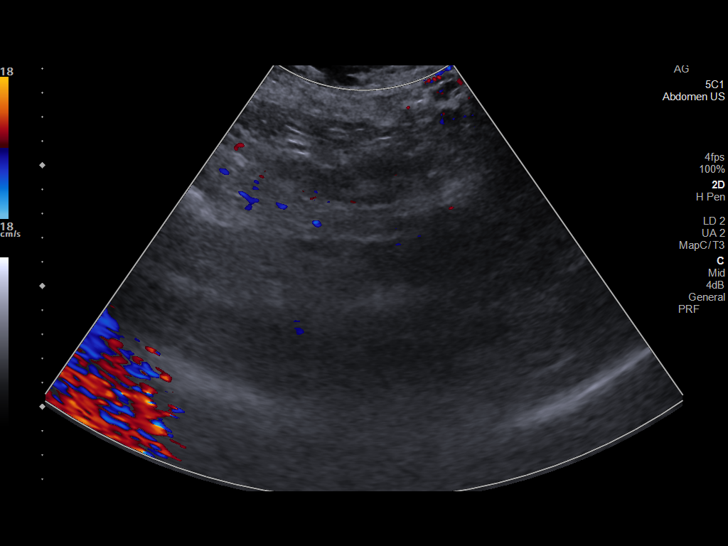

[13 of 25 positions shown; findings below may reference images not displayed]

FINDINGS: Gallbladder: Surgically absent.

Common bile duct: Diameter: 3-4 mm, normal.

Liver: Heterogeneous increased hepatic echogenicity. The liver
parenchyma is difficult to penetrate. No evidence of focal lesion.
There is mild mild capsular nodularity. Portal vein is patent on
color Doppler imaging with normal direction of blood flow towards
the liver.

IVC: Limited assessment, no gross abnormality.

Pancreas: Limited by overlying bowel gas, no gross abnormality.

Spleen: Measures 12.7 x 12.9 x 4.3 cm for volume of 368 cc, upper
normal.

Right Kidney: Length: 11.2 cm. Echogenicity within normal limits. No
mass or hydronephrosis visualized.

Left Kidney: Length: 11.8 cm. Echogenicity within normal limits. No
mass or hydronephrosis visualized.

Abdominal aorta: Distal aorta and aortic bifurcation are obscured by
bowel gas. Upper aorta normal in caliber.

Other findings: No ascites or free fluid.
IMPRESSION: 1. Increased hepatic echogenicity with nodular contours consistent
with cirrhosis and hepatic steatosis. No discrete lesion is seen
sonographically.
2. Post cholecystectomy without biliary dilatation.
3. Midline structures obscured by bowel gas.

## 2021-08-14 DIAGNOSIS — Z4789 Encounter for other orthopedic aftercare: Secondary | ICD-10-CM | POA: Diagnosis not present

## 2021-08-14 DIAGNOSIS — M48061 Spinal stenosis, lumbar region without neurogenic claudication: Secondary | ICD-10-CM | POA: Diagnosis not present

## 2021-08-14 DIAGNOSIS — E119 Type 2 diabetes mellitus without complications: Secondary | ICD-10-CM | POA: Diagnosis not present

## 2021-08-14 DIAGNOSIS — M4316 Spondylolisthesis, lumbar region: Secondary | ICD-10-CM | POA: Diagnosis not present

## 2021-08-14 DIAGNOSIS — M810 Age-related osteoporosis without current pathological fracture: Secondary | ICD-10-CM | POA: Diagnosis not present

## 2021-08-14 DIAGNOSIS — M1990 Unspecified osteoarthritis, unspecified site: Secondary | ICD-10-CM | POA: Diagnosis not present

## 2021-08-20 DIAGNOSIS — M4316 Spondylolisthesis, lumbar region: Secondary | ICD-10-CM | POA: Diagnosis not present

## 2021-08-20 DIAGNOSIS — E119 Type 2 diabetes mellitus without complications: Secondary | ICD-10-CM | POA: Diagnosis not present

## 2021-08-20 DIAGNOSIS — M810 Age-related osteoporosis without current pathological fracture: Secondary | ICD-10-CM | POA: Diagnosis not present

## 2021-08-20 DIAGNOSIS — Z4789 Encounter for other orthopedic aftercare: Secondary | ICD-10-CM | POA: Diagnosis not present

## 2021-08-20 DIAGNOSIS — M48061 Spinal stenosis, lumbar region without neurogenic claudication: Secondary | ICD-10-CM | POA: Diagnosis not present

## 2021-08-20 DIAGNOSIS — M1990 Unspecified osteoarthritis, unspecified site: Secondary | ICD-10-CM | POA: Diagnosis not present

## 2021-08-28 DIAGNOSIS — Z79890 Hormone replacement therapy (postmenopausal): Secondary | ICD-10-CM | POA: Diagnosis not present

## 2021-08-28 DIAGNOSIS — S2020XA Contusion of thorax, unspecified, initial encounter: Secondary | ICD-10-CM | POA: Diagnosis not present

## 2021-08-28 DIAGNOSIS — K219 Gastro-esophageal reflux disease without esophagitis: Secondary | ICD-10-CM | POA: Diagnosis not present

## 2021-08-28 DIAGNOSIS — S20211A Contusion of right front wall of thorax, initial encounter: Secondary | ICD-10-CM | POA: Diagnosis not present

## 2021-08-28 DIAGNOSIS — Z7984 Long term (current) use of oral hypoglycemic drugs: Secondary | ICD-10-CM | POA: Diagnosis not present

## 2021-08-28 DIAGNOSIS — M79652 Pain in left thigh: Secondary | ICD-10-CM | POA: Diagnosis not present

## 2021-08-28 DIAGNOSIS — R079 Chest pain, unspecified: Secondary | ICD-10-CM | POA: Diagnosis not present

## 2021-08-28 DIAGNOSIS — F32A Depression, unspecified: Secondary | ICD-10-CM | POA: Diagnosis not present

## 2021-08-28 DIAGNOSIS — M7989 Other specified soft tissue disorders: Secondary | ICD-10-CM | POA: Diagnosis not present

## 2021-08-28 DIAGNOSIS — Z79899 Other long term (current) drug therapy: Secondary | ICD-10-CM | POA: Diagnosis not present

## 2021-08-28 DIAGNOSIS — S8261XA Displaced fracture of lateral malleolus of right fibula, initial encounter for closed fracture: Secondary | ICD-10-CM | POA: Diagnosis not present

## 2021-08-28 DIAGNOSIS — Z881 Allergy status to other antibiotic agents status: Secondary | ICD-10-CM | POA: Diagnosis not present

## 2021-08-28 DIAGNOSIS — F419 Anxiety disorder, unspecified: Secondary | ICD-10-CM | POA: Diagnosis not present

## 2021-08-28 DIAGNOSIS — E1140 Type 2 diabetes mellitus with diabetic neuropathy, unspecified: Secondary | ICD-10-CM | POA: Diagnosis not present

## 2021-08-28 DIAGNOSIS — S8291XA Unspecified fracture of right lower leg, initial encounter for closed fracture: Secondary | ICD-10-CM | POA: Diagnosis not present

## 2021-08-28 DIAGNOSIS — I10 Essential (primary) hypertension: Secondary | ICD-10-CM | POA: Diagnosis not present

## 2021-09-02 ENCOUNTER — Ambulatory Visit: Payer: Medicare Other | Admitting: Cardiology

## 2021-09-03 DIAGNOSIS — M25571 Pain in right ankle and joints of right foot: Secondary | ICD-10-CM | POA: Diagnosis not present

## 2021-09-16 DIAGNOSIS — Z23 Encounter for immunization: Secondary | ICD-10-CM | POA: Diagnosis not present

## 2021-09-24 ENCOUNTER — Ambulatory Visit: Payer: Medicare Other | Admitting: Cardiology

## 2021-10-16 ENCOUNTER — Other Ambulatory Visit: Payer: Self-pay | Admitting: Cardiology

## 2021-10-16 DIAGNOSIS — I2584 Coronary atherosclerosis due to calcified coronary lesion: Secondary | ICD-10-CM

## 2021-10-16 DIAGNOSIS — R0609 Other forms of dyspnea: Secondary | ICD-10-CM

## 2021-10-16 DIAGNOSIS — I2510 Atherosclerotic heart disease of native coronary artery without angina pectoris: Secondary | ICD-10-CM

## 2021-11-21 DIAGNOSIS — Z789 Other specified health status: Secondary | ICD-10-CM | POA: Diagnosis not present

## 2021-11-21 DIAGNOSIS — I10 Essential (primary) hypertension: Secondary | ICD-10-CM | POA: Diagnosis not present

## 2021-11-21 DIAGNOSIS — E1165 Type 2 diabetes mellitus with hyperglycemia: Secondary | ICD-10-CM | POA: Diagnosis not present

## 2021-11-21 DIAGNOSIS — Z299 Encounter for prophylactic measures, unspecified: Secondary | ICD-10-CM | POA: Diagnosis not present

## 2021-11-21 DIAGNOSIS — E1142 Type 2 diabetes mellitus with diabetic polyneuropathy: Secondary | ICD-10-CM | POA: Diagnosis not present

## 2021-11-21 DIAGNOSIS — M545 Low back pain, unspecified: Secondary | ICD-10-CM | POA: Diagnosis not present

## 2021-12-03 DIAGNOSIS — I10 Essential (primary) hypertension: Secondary | ICD-10-CM | POA: Diagnosis not present

## 2021-12-03 DIAGNOSIS — Z6832 Body mass index (BMI) 32.0-32.9, adult: Secondary | ICD-10-CM | POA: Diagnosis not present

## 2021-12-03 DIAGNOSIS — Z299 Encounter for prophylactic measures, unspecified: Secondary | ICD-10-CM | POA: Diagnosis not present

## 2021-12-13 DIAGNOSIS — H40053 Ocular hypertension, bilateral: Secondary | ICD-10-CM | POA: Diagnosis not present

## 2021-12-24 DIAGNOSIS — E1165 Type 2 diabetes mellitus with hyperglycemia: Secondary | ICD-10-CM | POA: Diagnosis not present

## 2021-12-24 DIAGNOSIS — E7800 Pure hypercholesterolemia, unspecified: Secondary | ICD-10-CM | POA: Diagnosis not present

## 2021-12-24 DIAGNOSIS — E039 Hypothyroidism, unspecified: Secondary | ICD-10-CM | POA: Diagnosis not present

## 2021-12-24 DIAGNOSIS — I2510 Atherosclerotic heart disease of native coronary artery without angina pectoris: Secondary | ICD-10-CM | POA: Diagnosis not present

## 2021-12-24 DIAGNOSIS — K7460 Unspecified cirrhosis of liver: Secondary | ICD-10-CM | POA: Diagnosis not present

## 2021-12-24 DIAGNOSIS — I10 Essential (primary) hypertension: Secondary | ICD-10-CM | POA: Diagnosis not present

## 2021-12-24 DIAGNOSIS — Z9641 Presence of insulin pump (external) (internal): Secondary | ICD-10-CM | POA: Diagnosis not present

## 2021-12-29 DIAGNOSIS — E1165 Type 2 diabetes mellitus with hyperglycemia: Secondary | ICD-10-CM | POA: Diagnosis not present

## 2022-01-31 DIAGNOSIS — Z299 Encounter for prophylactic measures, unspecified: Secondary | ICD-10-CM | POA: Diagnosis not present

## 2022-01-31 DIAGNOSIS — K7460 Unspecified cirrhosis of liver: Secondary | ICD-10-CM | POA: Diagnosis not present

## 2022-01-31 DIAGNOSIS — I700 Atherosclerosis of aorta: Secondary | ICD-10-CM | POA: Diagnosis not present

## 2022-01-31 DIAGNOSIS — M545 Low back pain, unspecified: Secondary | ICD-10-CM | POA: Diagnosis not present

## 2022-02-02 DIAGNOSIS — J209 Acute bronchitis, unspecified: Secondary | ICD-10-CM | POA: Diagnosis not present

## 2022-02-02 DIAGNOSIS — I10 Essential (primary) hypertension: Secondary | ICD-10-CM | POA: Diagnosis not present

## 2022-02-06 DIAGNOSIS — E1165 Type 2 diabetes mellitus with hyperglycemia: Secondary | ICD-10-CM | POA: Diagnosis not present

## 2022-02-06 DIAGNOSIS — I2510 Atherosclerotic heart disease of native coronary artery without angina pectoris: Secondary | ICD-10-CM | POA: Diagnosis not present

## 2022-02-06 DIAGNOSIS — Z9641 Presence of insulin pump (external) (internal): Secondary | ICD-10-CM | POA: Diagnosis not present

## 2022-02-06 DIAGNOSIS — E039 Hypothyroidism, unspecified: Secondary | ICD-10-CM | POA: Diagnosis not present

## 2022-02-06 DIAGNOSIS — I10 Essential (primary) hypertension: Secondary | ICD-10-CM | POA: Diagnosis not present

## 2022-02-06 DIAGNOSIS — K7460 Unspecified cirrhosis of liver: Secondary | ICD-10-CM | POA: Diagnosis not present

## 2022-02-06 DIAGNOSIS — E7800 Pure hypercholesterolemia, unspecified: Secondary | ICD-10-CM | POA: Diagnosis not present

## 2022-02-25 ENCOUNTER — Encounter (INDEPENDENT_AMBULATORY_CARE_PROVIDER_SITE_OTHER): Payer: Self-pay | Admitting: *Deleted

## 2022-02-28 ENCOUNTER — Other Ambulatory Visit (INDEPENDENT_AMBULATORY_CARE_PROVIDER_SITE_OTHER): Payer: Self-pay | Admitting: Internal Medicine

## 2022-03-04 ENCOUNTER — Telehealth (INDEPENDENT_AMBULATORY_CARE_PROVIDER_SITE_OTHER): Payer: Self-pay

## 2022-03-04 NOTE — Telephone Encounter (Signed)
06/05/2020 last seen by Dr. Laural Golden

## 2022-03-04 NOTE — Telephone Encounter (Signed)
No future appointment here at the clinic.

## 2022-03-04 NOTE — Telephone Encounter (Signed)
Per Dr. Laural Golden patient needs an appointment.

## 2022-03-21 DIAGNOSIS — I10 Essential (primary) hypertension: Secondary | ICD-10-CM | POA: Diagnosis not present

## 2022-03-21 DIAGNOSIS — R531 Weakness: Secondary | ICD-10-CM | POA: Diagnosis not present

## 2022-03-21 DIAGNOSIS — F32A Depression, unspecified: Secondary | ICD-10-CM | POA: Diagnosis not present

## 2022-03-21 DIAGNOSIS — K529 Noninfective gastroenteritis and colitis, unspecified: Secondary | ICD-10-CM | POA: Diagnosis not present

## 2022-03-21 DIAGNOSIS — Z79899 Other long term (current) drug therapy: Secondary | ICD-10-CM | POA: Diagnosis not present

## 2022-03-21 DIAGNOSIS — R1110 Vomiting, unspecified: Secondary | ICD-10-CM | POA: Diagnosis not present

## 2022-03-21 DIAGNOSIS — Z7982 Long term (current) use of aspirin: Secondary | ICD-10-CM | POA: Diagnosis not present

## 2022-03-21 DIAGNOSIS — Z794 Long term (current) use of insulin: Secondary | ICD-10-CM | POA: Diagnosis not present

## 2022-03-21 DIAGNOSIS — Z7984 Long term (current) use of oral hypoglycemic drugs: Secondary | ICD-10-CM | POA: Diagnosis not present

## 2022-03-21 DIAGNOSIS — Z20822 Contact with and (suspected) exposure to covid-19: Secondary | ICD-10-CM | POA: Diagnosis not present

## 2022-03-21 DIAGNOSIS — R1084 Generalized abdominal pain: Secondary | ICD-10-CM | POA: Diagnosis not present

## 2022-03-21 DIAGNOSIS — I959 Hypotension, unspecified: Secondary | ICD-10-CM | POA: Diagnosis not present

## 2022-03-21 DIAGNOSIS — K219 Gastro-esophageal reflux disease without esophagitis: Secondary | ICD-10-CM | POA: Diagnosis not present

## 2022-03-21 DIAGNOSIS — R1111 Vomiting without nausea: Secondary | ICD-10-CM | POA: Diagnosis not present

## 2022-03-21 DIAGNOSIS — I700 Atherosclerosis of aorta: Secondary | ICD-10-CM | POA: Diagnosis not present

## 2022-03-21 DIAGNOSIS — R109 Unspecified abdominal pain: Secondary | ICD-10-CM | POA: Diagnosis not present

## 2022-03-21 DIAGNOSIS — E1140 Type 2 diabetes mellitus with diabetic neuropathy, unspecified: Secondary | ICD-10-CM | POA: Diagnosis not present

## 2022-03-21 DIAGNOSIS — R110 Nausea: Secondary | ICD-10-CM | POA: Diagnosis not present

## 2022-03-21 DIAGNOSIS — R112 Nausea with vomiting, unspecified: Secondary | ICD-10-CM | POA: Diagnosis not present

## 2022-04-21 ENCOUNTER — Ambulatory Visit (INDEPENDENT_AMBULATORY_CARE_PROVIDER_SITE_OTHER): Payer: Medicare Other | Admitting: Gastroenterology

## 2022-04-21 ENCOUNTER — Encounter (INDEPENDENT_AMBULATORY_CARE_PROVIDER_SITE_OTHER): Payer: Self-pay | Admitting: Gastroenterology

## 2022-04-21 DIAGNOSIS — K580 Irritable bowel syndrome with diarrhea: Secondary | ICD-10-CM | POA: Diagnosis not present

## 2022-04-21 DIAGNOSIS — Z79891 Long term (current) use of opiate analgesic: Secondary | ICD-10-CM | POA: Diagnosis not present

## 2022-04-21 DIAGNOSIS — N3000 Acute cystitis without hematuria: Secondary | ICD-10-CM | POA: Diagnosis not present

## 2022-04-21 DIAGNOSIS — F32A Depression, unspecified: Secondary | ICD-10-CM | POA: Diagnosis not present

## 2022-04-21 DIAGNOSIS — R197 Diarrhea, unspecified: Secondary | ICD-10-CM | POA: Diagnosis not present

## 2022-04-21 DIAGNOSIS — R1084 Generalized abdominal pain: Secondary | ICD-10-CM | POA: Diagnosis not present

## 2022-04-21 DIAGNOSIS — R0789 Other chest pain: Secondary | ICD-10-CM | POA: Diagnosis not present

## 2022-04-21 DIAGNOSIS — R0602 Shortness of breath: Secondary | ICD-10-CM | POA: Diagnosis not present

## 2022-04-21 DIAGNOSIS — R6889 Other general symptoms and signs: Secondary | ICD-10-CM | POA: Diagnosis not present

## 2022-04-21 DIAGNOSIS — E872 Acidosis, unspecified: Secondary | ICD-10-CM | POA: Diagnosis not present

## 2022-04-21 DIAGNOSIS — R079 Chest pain, unspecified: Secondary | ICD-10-CM | POA: Diagnosis not present

## 2022-04-21 DIAGNOSIS — I10 Essential (primary) hypertension: Secondary | ICD-10-CM | POA: Diagnosis not present

## 2022-04-21 DIAGNOSIS — Z7982 Long term (current) use of aspirin: Secondary | ICD-10-CM | POA: Diagnosis not present

## 2022-04-21 DIAGNOSIS — D72829 Elevated white blood cell count, unspecified: Secondary | ICD-10-CM | POA: Diagnosis not present

## 2022-04-21 DIAGNOSIS — E785 Hyperlipidemia, unspecified: Secondary | ICD-10-CM | POA: Diagnosis not present

## 2022-04-21 DIAGNOSIS — R7402 Elevation of levels of lactic acid dehydrogenase (LDH): Secondary | ICD-10-CM | POA: Diagnosis not present

## 2022-04-21 DIAGNOSIS — Z79899 Other long term (current) drug therapy: Secondary | ICD-10-CM | POA: Diagnosis not present

## 2022-04-21 DIAGNOSIS — K5730 Diverticulosis of large intestine without perforation or abscess without bleeding: Secondary | ICD-10-CM | POA: Diagnosis not present

## 2022-04-21 DIAGNOSIS — I2510 Atherosclerotic heart disease of native coronary artery without angina pectoris: Secondary | ICD-10-CM | POA: Diagnosis not present

## 2022-04-21 DIAGNOSIS — R1030 Lower abdominal pain, unspecified: Secondary | ICD-10-CM | POA: Diagnosis not present

## 2022-04-21 DIAGNOSIS — Z7951 Long term (current) use of inhaled steroids: Secondary | ICD-10-CM | POA: Diagnosis not present

## 2022-04-21 DIAGNOSIS — K5792 Diverticulitis of intestine, part unspecified, without perforation or abscess without bleeding: Secondary | ICD-10-CM | POA: Diagnosis not present

## 2022-04-21 DIAGNOSIS — E119 Type 2 diabetes mellitus without complications: Secondary | ICD-10-CM | POA: Diagnosis not present

## 2022-04-21 DIAGNOSIS — Z7984 Long term (current) use of oral hypoglycemic drugs: Secondary | ICD-10-CM | POA: Diagnosis not present

## 2022-04-21 DIAGNOSIS — Z792 Long term (current) use of antibiotics: Secondary | ICD-10-CM | POA: Diagnosis not present

## 2022-04-21 DIAGNOSIS — K7460 Unspecified cirrhosis of liver: Secondary | ICD-10-CM | POA: Diagnosis not present

## 2022-04-21 DIAGNOSIS — I499 Cardiac arrhythmia, unspecified: Secondary | ICD-10-CM | POA: Diagnosis not present

## 2022-04-21 DIAGNOSIS — Z794 Long term (current) use of insulin: Secondary | ICD-10-CM | POA: Diagnosis not present

## 2022-04-21 DIAGNOSIS — R6510 Systemic inflammatory response syndrome (SIRS) of non-infectious origin without acute organ dysfunction: Secondary | ICD-10-CM | POA: Diagnosis not present

## 2022-04-21 DIAGNOSIS — K219 Gastro-esophageal reflux disease without esophagitis: Secondary | ICD-10-CM | POA: Diagnosis not present

## 2022-04-21 DIAGNOSIS — R1110 Vomiting, unspecified: Secondary | ICD-10-CM | POA: Diagnosis not present

## 2022-04-21 DIAGNOSIS — R109 Unspecified abdominal pain: Secondary | ICD-10-CM | POA: Diagnosis not present

## 2022-04-21 DIAGNOSIS — E1140 Type 2 diabetes mellitus with diabetic neuropathy, unspecified: Secondary | ICD-10-CM | POA: Diagnosis not present

## 2022-04-21 DIAGNOSIS — Z20822 Contact with and (suspected) exposure to covid-19: Secondary | ICD-10-CM | POA: Diagnosis not present

## 2022-04-21 DIAGNOSIS — Z743 Need for continuous supervision: Secondary | ICD-10-CM | POA: Diagnosis not present

## 2022-04-21 DIAGNOSIS — Z9641 Presence of insulin pump (external) (internal): Secondary | ICD-10-CM | POA: Diagnosis not present

## 2022-04-22 DIAGNOSIS — Z792 Long term (current) use of antibiotics: Secondary | ICD-10-CM | POA: Diagnosis not present

## 2022-04-22 DIAGNOSIS — R1110 Vomiting, unspecified: Secondary | ICD-10-CM | POA: Diagnosis not present

## 2022-04-22 DIAGNOSIS — D72829 Elevated white blood cell count, unspecified: Secondary | ICD-10-CM | POA: Diagnosis not present

## 2022-04-22 DIAGNOSIS — E119 Type 2 diabetes mellitus without complications: Secondary | ICD-10-CM | POA: Diagnosis not present

## 2022-04-22 DIAGNOSIS — R197 Diarrhea, unspecified: Secondary | ICD-10-CM | POA: Diagnosis not present

## 2022-04-22 DIAGNOSIS — Z7984 Long term (current) use of oral hypoglycemic drugs: Secondary | ICD-10-CM | POA: Diagnosis not present

## 2022-04-22 DIAGNOSIS — I10 Essential (primary) hypertension: Secondary | ICD-10-CM | POA: Diagnosis not present

## 2022-04-22 DIAGNOSIS — K5792 Diverticulitis of intestine, part unspecified, without perforation or abscess without bleeding: Secondary | ICD-10-CM | POA: Diagnosis not present

## 2022-04-22 DIAGNOSIS — E872 Acidosis, unspecified: Secondary | ICD-10-CM | POA: Diagnosis not present

## 2022-04-22 DIAGNOSIS — R6510 Systemic inflammatory response syndrome (SIRS) of non-infectious origin without acute organ dysfunction: Secondary | ICD-10-CM | POA: Diagnosis not present

## 2022-04-22 DIAGNOSIS — Z79899 Other long term (current) drug therapy: Secondary | ICD-10-CM | POA: Diagnosis not present

## 2022-04-23 DIAGNOSIS — Z79899 Other long term (current) drug therapy: Secondary | ICD-10-CM | POA: Diagnosis not present

## 2022-04-23 DIAGNOSIS — Z794 Long term (current) use of insulin: Secondary | ICD-10-CM | POA: Diagnosis not present

## 2022-04-23 DIAGNOSIS — Z7984 Long term (current) use of oral hypoglycemic drugs: Secondary | ICD-10-CM | POA: Diagnosis not present

## 2022-04-23 DIAGNOSIS — E119 Type 2 diabetes mellitus without complications: Secondary | ICD-10-CM | POA: Diagnosis not present

## 2022-04-23 DIAGNOSIS — I10 Essential (primary) hypertension: Secondary | ICD-10-CM | POA: Diagnosis not present

## 2022-04-23 DIAGNOSIS — K5792 Diverticulitis of intestine, part unspecified, without perforation or abscess without bleeding: Secondary | ICD-10-CM | POA: Diagnosis not present

## 2022-04-23 DIAGNOSIS — Z792 Long term (current) use of antibiotics: Secondary | ICD-10-CM | POA: Diagnosis not present

## 2022-04-24 DIAGNOSIS — Z7984 Long term (current) use of oral hypoglycemic drugs: Secondary | ICD-10-CM | POA: Diagnosis not present

## 2022-04-24 DIAGNOSIS — K5792 Diverticulitis of intestine, part unspecified, without perforation or abscess without bleeding: Secondary | ICD-10-CM | POA: Diagnosis not present

## 2022-04-24 DIAGNOSIS — Z794 Long term (current) use of insulin: Secondary | ICD-10-CM | POA: Diagnosis not present

## 2022-04-24 DIAGNOSIS — E119 Type 2 diabetes mellitus without complications: Secondary | ICD-10-CM | POA: Diagnosis not present

## 2022-04-24 DIAGNOSIS — I10 Essential (primary) hypertension: Secondary | ICD-10-CM | POA: Diagnosis not present

## 2022-04-24 DIAGNOSIS — K580 Irritable bowel syndrome with diarrhea: Secondary | ICD-10-CM | POA: Diagnosis not present

## 2022-04-30 DIAGNOSIS — Z09 Encounter for follow-up examination after completed treatment for conditions other than malignant neoplasm: Secondary | ICD-10-CM | POA: Diagnosis not present

## 2022-04-30 DIAGNOSIS — K5792 Diverticulitis of intestine, part unspecified, without perforation or abscess without bleeding: Secondary | ICD-10-CM | POA: Diagnosis not present

## 2022-04-30 DIAGNOSIS — I10 Essential (primary) hypertension: Secondary | ICD-10-CM | POA: Diagnosis not present

## 2022-04-30 DIAGNOSIS — Z299 Encounter for prophylactic measures, unspecified: Secondary | ICD-10-CM | POA: Diagnosis not present

## 2022-04-30 DIAGNOSIS — M545 Low back pain, unspecified: Secondary | ICD-10-CM | POA: Diagnosis not present

## 2022-05-01 DIAGNOSIS — Z1231 Encounter for screening mammogram for malignant neoplasm of breast: Secondary | ICD-10-CM | POA: Diagnosis not present

## 2022-05-08 DIAGNOSIS — R197 Diarrhea, unspecified: Secondary | ICD-10-CM | POA: Diagnosis not present

## 2022-05-08 DIAGNOSIS — K7460 Unspecified cirrhosis of liver: Secondary | ICD-10-CM | POA: Diagnosis not present

## 2022-05-08 DIAGNOSIS — Z299 Encounter for prophylactic measures, unspecified: Secondary | ICD-10-CM | POA: Diagnosis not present

## 2022-05-08 DIAGNOSIS — I10 Essential (primary) hypertension: Secondary | ICD-10-CM | POA: Diagnosis not present

## 2022-05-08 DIAGNOSIS — I700 Atherosclerosis of aorta: Secondary | ICD-10-CM | POA: Diagnosis not present

## 2022-07-07 DIAGNOSIS — Z713 Dietary counseling and surveillance: Secondary | ICD-10-CM | POA: Diagnosis not present

## 2022-07-07 DIAGNOSIS — Z5320 Procedure and treatment not carried out because of patient's decision for unspecified reasons: Secondary | ICD-10-CM | POA: Diagnosis not present

## 2022-07-07 DIAGNOSIS — Z789 Other specified health status: Secondary | ICD-10-CM | POA: Diagnosis not present

## 2022-07-07 DIAGNOSIS — Z0000 Encounter for general adult medical examination without abnormal findings: Secondary | ICD-10-CM | POA: Diagnosis not present

## 2022-07-07 DIAGNOSIS — Z7189 Other specified counseling: Secondary | ICD-10-CM | POA: Diagnosis not present

## 2022-07-07 DIAGNOSIS — I10 Essential (primary) hypertension: Secondary | ICD-10-CM | POA: Diagnosis not present

## 2022-07-07 DIAGNOSIS — Z299 Encounter for prophylactic measures, unspecified: Secondary | ICD-10-CM | POA: Diagnosis not present

## 2022-07-08 DIAGNOSIS — E039 Hypothyroidism, unspecified: Secondary | ICD-10-CM | POA: Diagnosis not present

## 2022-07-08 DIAGNOSIS — Z79899 Other long term (current) drug therapy: Secondary | ICD-10-CM | POA: Diagnosis not present

## 2022-07-08 DIAGNOSIS — E7800 Pure hypercholesterolemia, unspecified: Secondary | ICD-10-CM | POA: Diagnosis not present

## 2022-07-22 ENCOUNTER — Ambulatory Visit: Payer: Medicare Other | Admitting: Gastroenterology

## 2022-09-03 DIAGNOSIS — B356 Tinea cruris: Secondary | ICD-10-CM | POA: Diagnosis not present

## 2022-09-03 DIAGNOSIS — Z23 Encounter for immunization: Secondary | ICD-10-CM | POA: Diagnosis not present

## 2022-09-03 DIAGNOSIS — Z299 Encounter for prophylactic measures, unspecified: Secondary | ICD-10-CM | POA: Diagnosis not present

## 2022-09-03 DIAGNOSIS — I10 Essential (primary) hypertension: Secondary | ICD-10-CM | POA: Diagnosis not present

## 2022-09-03 DIAGNOSIS — M545 Low back pain, unspecified: Secondary | ICD-10-CM | POA: Diagnosis not present

## 2022-09-03 DIAGNOSIS — M4807 Spinal stenosis, lumbosacral region: Secondary | ICD-10-CM | POA: Diagnosis not present

## 2022-09-03 DIAGNOSIS — R52 Pain, unspecified: Secondary | ICD-10-CM | POA: Diagnosis not present

## 2022-09-03 DIAGNOSIS — M8588 Other specified disorders of bone density and structure, other site: Secondary | ICD-10-CM | POA: Diagnosis not present

## 2022-09-12 DIAGNOSIS — K7460 Unspecified cirrhosis of liver: Secondary | ICD-10-CM | POA: Diagnosis not present

## 2022-09-12 DIAGNOSIS — Z9641 Presence of insulin pump (external) (internal): Secondary | ICD-10-CM | POA: Diagnosis not present

## 2022-09-12 DIAGNOSIS — E039 Hypothyroidism, unspecified: Secondary | ICD-10-CM | POA: Diagnosis not present

## 2022-09-12 DIAGNOSIS — E1165 Type 2 diabetes mellitus with hyperglycemia: Secondary | ICD-10-CM | POA: Diagnosis not present

## 2022-09-12 DIAGNOSIS — E7800 Pure hypercholesterolemia, unspecified: Secondary | ICD-10-CM | POA: Diagnosis not present

## 2022-09-12 DIAGNOSIS — I2510 Atherosclerotic heart disease of native coronary artery without angina pectoris: Secondary | ICD-10-CM | POA: Diagnosis not present

## 2022-09-12 DIAGNOSIS — I10 Essential (primary) hypertension: Secondary | ICD-10-CM | POA: Diagnosis not present

## 2022-10-14 ENCOUNTER — Other Ambulatory Visit: Payer: Self-pay | Admitting: Cardiology

## 2022-10-14 DIAGNOSIS — I2510 Atherosclerotic heart disease of native coronary artery without angina pectoris: Secondary | ICD-10-CM

## 2022-10-14 DIAGNOSIS — R0609 Other forms of dyspnea: Secondary | ICD-10-CM

## 2022-10-23 DIAGNOSIS — E1142 Type 2 diabetes mellitus with diabetic polyneuropathy: Secondary | ICD-10-CM | POA: Diagnosis not present

## 2022-10-23 DIAGNOSIS — I700 Atherosclerosis of aorta: Secondary | ICD-10-CM | POA: Diagnosis not present

## 2022-10-23 DIAGNOSIS — M545 Low back pain, unspecified: Secondary | ICD-10-CM | POA: Diagnosis not present

## 2022-12-26 DIAGNOSIS — M4800 Spinal stenosis, site unspecified: Secondary | ICD-10-CM | POA: Diagnosis not present

## 2022-12-26 DIAGNOSIS — M545 Low back pain, unspecified: Secondary | ICD-10-CM | POA: Diagnosis not present

## 2023-02-10 DIAGNOSIS — Z299 Encounter for prophylactic measures, unspecified: Secondary | ICD-10-CM | POA: Diagnosis not present

## 2023-02-10 DIAGNOSIS — R52 Pain, unspecified: Secondary | ICD-10-CM | POA: Diagnosis not present

## 2023-02-10 DIAGNOSIS — I10 Essential (primary) hypertension: Secondary | ICD-10-CM | POA: Diagnosis not present

## 2023-02-10 DIAGNOSIS — E1142 Type 2 diabetes mellitus with diabetic polyneuropathy: Secondary | ICD-10-CM | POA: Diagnosis not present

## 2023-02-10 DIAGNOSIS — M545 Low back pain, unspecified: Secondary | ICD-10-CM | POA: Diagnosis not present

## 2023-02-12 DIAGNOSIS — E1165 Type 2 diabetes mellitus with hyperglycemia: Secondary | ICD-10-CM | POA: Diagnosis not present

## 2023-02-12 DIAGNOSIS — K7460 Unspecified cirrhosis of liver: Secondary | ICD-10-CM | POA: Diagnosis not present

## 2023-02-12 DIAGNOSIS — Z9641 Presence of insulin pump (external) (internal): Secondary | ICD-10-CM | POA: Diagnosis not present

## 2023-02-12 DIAGNOSIS — E7800 Pure hypercholesterolemia, unspecified: Secondary | ICD-10-CM | POA: Diagnosis not present

## 2023-02-12 DIAGNOSIS — I2510 Atherosclerotic heart disease of native coronary artery without angina pectoris: Secondary | ICD-10-CM | POA: Diagnosis not present

## 2023-02-12 DIAGNOSIS — I10 Essential (primary) hypertension: Secondary | ICD-10-CM | POA: Diagnosis not present

## 2023-02-12 DIAGNOSIS — E039 Hypothyroidism, unspecified: Secondary | ICD-10-CM | POA: Diagnosis not present

## 2023-04-26 DIAGNOSIS — R1111 Vomiting without nausea: Secondary | ICD-10-CM | POA: Diagnosis not present

## 2023-04-26 DIAGNOSIS — R531 Weakness: Secondary | ICD-10-CM | POA: Diagnosis not present

## 2023-06-17 DIAGNOSIS — K766 Portal hypertension: Secondary | ICD-10-CM | POA: Diagnosis not present

## 2023-06-17 DIAGNOSIS — F32A Depression, unspecified: Secondary | ICD-10-CM | POA: Diagnosis not present

## 2023-06-17 DIAGNOSIS — K5732 Diverticulitis of large intestine without perforation or abscess without bleeding: Secondary | ICD-10-CM | POA: Diagnosis not present

## 2023-06-17 DIAGNOSIS — E872 Acidosis, unspecified: Secondary | ICD-10-CM | POA: Diagnosis not present

## 2023-06-17 DIAGNOSIS — K5792 Diverticulitis of intestine, part unspecified, without perforation or abscess without bleeding: Secondary | ICD-10-CM | POA: Diagnosis not present

## 2023-06-17 DIAGNOSIS — Z79899 Other long term (current) drug therapy: Secondary | ICD-10-CM | POA: Diagnosis not present

## 2023-06-17 DIAGNOSIS — R6889 Other general symptoms and signs: Secondary | ICD-10-CM | POA: Diagnosis not present

## 2023-06-17 DIAGNOSIS — I4581 Long QT syndrome: Secondary | ICD-10-CM | POA: Diagnosis not present

## 2023-06-17 DIAGNOSIS — N179 Acute kidney failure, unspecified: Secondary | ICD-10-CM | POA: Diagnosis not present

## 2023-06-17 DIAGNOSIS — Z8719 Personal history of other diseases of the digestive system: Secondary | ICD-10-CM | POA: Diagnosis not present

## 2023-06-17 DIAGNOSIS — R1032 Left lower quadrant pain: Secondary | ICD-10-CM | POA: Diagnosis not present

## 2023-06-17 DIAGNOSIS — Z743 Need for continuous supervision: Secondary | ICD-10-CM | POA: Diagnosis not present

## 2023-06-17 DIAGNOSIS — E119 Type 2 diabetes mellitus without complications: Secondary | ICD-10-CM | POA: Diagnosis not present

## 2023-06-17 DIAGNOSIS — E039 Hypothyroidism, unspecified: Secondary | ICD-10-CM | POA: Diagnosis not present

## 2023-06-17 DIAGNOSIS — K7581 Nonalcoholic steatohepatitis (NASH): Secondary | ICD-10-CM | POA: Diagnosis not present

## 2023-06-17 DIAGNOSIS — K439 Ventral hernia without obstruction or gangrene: Secondary | ICD-10-CM | POA: Diagnosis not present

## 2023-06-17 DIAGNOSIS — L299 Pruritus, unspecified: Secondary | ICD-10-CM | POA: Diagnosis not present

## 2023-06-17 DIAGNOSIS — R739 Hyperglycemia, unspecified: Secondary | ICD-10-CM | POA: Diagnosis not present

## 2023-06-17 DIAGNOSIS — R9431 Abnormal electrocardiogram [ECG] [EKG]: Secondary | ICD-10-CM | POA: Diagnosis not present

## 2023-06-17 DIAGNOSIS — E1121 Type 2 diabetes mellitus with diabetic nephropathy: Secondary | ICD-10-CM | POA: Diagnosis not present

## 2023-06-17 DIAGNOSIS — A419 Sepsis, unspecified organism: Secondary | ICD-10-CM | POA: Diagnosis not present

## 2023-06-18 DIAGNOSIS — K5792 Diverticulitis of intestine, part unspecified, without perforation or abscess without bleeding: Secondary | ICD-10-CM | POA: Diagnosis not present

## 2023-06-18 DIAGNOSIS — I4581 Long QT syndrome: Secondary | ICD-10-CM | POA: Diagnosis not present

## 2023-06-18 DIAGNOSIS — N179 Acute kidney failure, unspecified: Secondary | ICD-10-CM | POA: Diagnosis not present

## 2023-06-18 DIAGNOSIS — A419 Sepsis, unspecified organism: Secondary | ICD-10-CM | POA: Diagnosis not present

## 2023-06-23 DIAGNOSIS — K589 Irritable bowel syndrome without diarrhea: Secondary | ICD-10-CM | POA: Diagnosis not present

## 2023-08-31 DIAGNOSIS — Z7982 Long term (current) use of aspirin: Secondary | ICD-10-CM | POA: Diagnosis not present

## 2023-08-31 DIAGNOSIS — I10 Essential (primary) hypertension: Secondary | ICD-10-CM | POA: Diagnosis not present

## 2023-08-31 DIAGNOSIS — M542 Cervicalgia: Secondary | ICD-10-CM | POA: Diagnosis not present

## 2023-08-31 DIAGNOSIS — I6521 Occlusion and stenosis of right carotid artery: Secondary | ICD-10-CM | POA: Diagnosis not present

## 2023-08-31 DIAGNOSIS — M19012 Primary osteoarthritis, left shoulder: Secondary | ICD-10-CM | POA: Diagnosis not present

## 2023-08-31 DIAGNOSIS — S0990XA Unspecified injury of head, initial encounter: Secondary | ICD-10-CM | POA: Diagnosis not present

## 2023-08-31 DIAGNOSIS — E1140 Type 2 diabetes mellitus with diabetic neuropathy, unspecified: Secondary | ICD-10-CM | POA: Diagnosis not present

## 2023-08-31 DIAGNOSIS — R519 Headache, unspecified: Secondary | ICD-10-CM | POA: Diagnosis not present

## 2023-08-31 DIAGNOSIS — Z794 Long term (current) use of insulin: Secondary | ICD-10-CM | POA: Diagnosis not present

## 2023-08-31 DIAGNOSIS — Z79890 Hormone replacement therapy (postmenopausal): Secondary | ICD-10-CM | POA: Diagnosis not present

## 2023-08-31 DIAGNOSIS — K7460 Unspecified cirrhosis of liver: Secondary | ICD-10-CM | POA: Diagnosis not present

## 2023-08-31 DIAGNOSIS — F419 Anxiety disorder, unspecified: Secondary | ICD-10-CM | POA: Diagnosis not present

## 2023-08-31 DIAGNOSIS — S2232XA Fracture of one rib, left side, initial encounter for closed fracture: Secondary | ICD-10-CM | POA: Diagnosis not present

## 2023-08-31 DIAGNOSIS — Z90710 Acquired absence of both cervix and uterus: Secondary | ICD-10-CM | POA: Diagnosis not present

## 2023-08-31 DIAGNOSIS — K5730 Diverticulosis of large intestine without perforation or abscess without bleeding: Secondary | ICD-10-CM | POA: Diagnosis not present

## 2023-08-31 DIAGNOSIS — W109XXA Fall (on) (from) unspecified stairs and steps, initial encounter: Secondary | ICD-10-CM | POA: Diagnosis not present

## 2023-08-31 DIAGNOSIS — I672 Cerebral atherosclerosis: Secondary | ICD-10-CM | POA: Diagnosis not present

## 2023-08-31 DIAGNOSIS — Z043 Encounter for examination and observation following other accident: Secondary | ICD-10-CM | POA: Diagnosis not present

## 2023-08-31 DIAGNOSIS — S4992XA Unspecified injury of left shoulder and upper arm, initial encounter: Secondary | ICD-10-CM | POA: Diagnosis not present

## 2023-09-01 DIAGNOSIS — R079 Chest pain, unspecified: Secondary | ICD-10-CM | POA: Diagnosis not present

## 2023-09-01 DIAGNOSIS — T1490XA Injury, unspecified, initial encounter: Secondary | ICD-10-CM | POA: Diagnosis not present

## 2023-09-01 DIAGNOSIS — M50321 Other cervical disc degeneration at C4-C5 level: Secondary | ICD-10-CM | POA: Diagnosis not present

## 2023-09-01 DIAGNOSIS — K7460 Unspecified cirrhosis of liver: Secondary | ICD-10-CM | POA: Diagnosis not present

## 2023-09-01 DIAGNOSIS — K219 Gastro-esophageal reflux disease without esophagitis: Secondary | ICD-10-CM | POA: Diagnosis not present

## 2023-09-01 DIAGNOSIS — M47812 Spondylosis without myelopathy or radiculopathy, cervical region: Secondary | ICD-10-CM | POA: Diagnosis not present

## 2023-09-01 DIAGNOSIS — M545 Low back pain, unspecified: Secondary | ICD-10-CM | POA: Diagnosis not present

## 2023-09-01 DIAGNOSIS — M542 Cervicalgia: Secondary | ICD-10-CM | POA: Diagnosis not present

## 2023-09-01 DIAGNOSIS — S064X0A Epidural hemorrhage without loss of consciousness, initial encounter: Secondary | ICD-10-CM | POA: Diagnosis not present

## 2023-09-01 DIAGNOSIS — S2232XA Fracture of one rib, left side, initial encounter for closed fracture: Secondary | ICD-10-CM | POA: Diagnosis not present

## 2023-09-01 DIAGNOSIS — M25512 Pain in left shoulder: Secondary | ICD-10-CM | POA: Diagnosis not present

## 2023-09-01 DIAGNOSIS — Z981 Arthrodesis status: Secondary | ICD-10-CM | POA: Diagnosis not present

## 2023-09-01 DIAGNOSIS — I10 Essential (primary) hypertension: Secondary | ICD-10-CM | POA: Diagnosis not present

## 2023-09-01 DIAGNOSIS — M4802 Spinal stenosis, cervical region: Secondary | ICD-10-CM | POA: Diagnosis not present

## 2023-09-01 DIAGNOSIS — M79622 Pain in left upper arm: Secondary | ICD-10-CM | POA: Diagnosis not present

## 2023-09-01 DIAGNOSIS — M546 Pain in thoracic spine: Secondary | ICD-10-CM | POA: Diagnosis not present

## 2023-09-01 DIAGNOSIS — M19012 Primary osteoarthritis, left shoulder: Secondary | ICD-10-CM | POA: Diagnosis not present

## 2023-09-02 DIAGNOSIS — T1490XA Injury, unspecified, initial encounter: Secondary | ICD-10-CM | POA: Diagnosis not present

## 2023-09-03 DIAGNOSIS — T1490XA Injury, unspecified, initial encounter: Secondary | ICD-10-CM | POA: Diagnosis not present

## 2023-09-04 DIAGNOSIS — T1490XA Injury, unspecified, initial encounter: Secondary | ICD-10-CM | POA: Diagnosis not present

## 2023-09-09 DIAGNOSIS — R58 Hemorrhage, not elsewhere classified: Secondary | ICD-10-CM | POA: Diagnosis not present

## 2023-09-09 DIAGNOSIS — I10 Essential (primary) hypertension: Secondary | ICD-10-CM | POA: Diagnosis not present

## 2023-09-09 DIAGNOSIS — Z23 Encounter for immunization: Secondary | ICD-10-CM | POA: Diagnosis not present

## 2023-09-09 DIAGNOSIS — Z09 Encounter for follow-up examination after completed treatment for conditions other than malignant neoplasm: Secondary | ICD-10-CM | POA: Diagnosis not present

## 2023-09-09 DIAGNOSIS — Z299 Encounter for prophylactic measures, unspecified: Secondary | ICD-10-CM | POA: Diagnosis not present

## 2023-09-09 DIAGNOSIS — F339 Major depressive disorder, recurrent, unspecified: Secondary | ICD-10-CM | POA: Diagnosis not present

## 2023-09-09 DIAGNOSIS — K7460 Unspecified cirrhosis of liver: Secondary | ICD-10-CM | POA: Diagnosis not present

## 2023-11-05 DIAGNOSIS — F419 Anxiety disorder, unspecified: Secondary | ICD-10-CM | POA: Diagnosis not present

## 2023-11-05 DIAGNOSIS — M545 Low back pain, unspecified: Secondary | ICD-10-CM | POA: Diagnosis not present

## 2023-11-05 DIAGNOSIS — E1142 Type 2 diabetes mellitus with diabetic polyneuropathy: Secondary | ICD-10-CM | POA: Diagnosis not present

## 2024-01-17 DIAGNOSIS — E1140 Type 2 diabetes mellitus with diabetic neuropathy, unspecified: Secondary | ICD-10-CM | POA: Diagnosis not present

## 2024-01-17 DIAGNOSIS — Z7901 Long term (current) use of anticoagulants: Secondary | ICD-10-CM | POA: Diagnosis not present

## 2024-01-17 DIAGNOSIS — E1122 Type 2 diabetes mellitus with diabetic chronic kidney disease: Secondary | ICD-10-CM | POA: Diagnosis not present

## 2024-01-17 DIAGNOSIS — E160 Drug-induced hypoglycemia without coma: Secondary | ICD-10-CM | POA: Diagnosis not present

## 2024-01-17 DIAGNOSIS — K219 Gastro-esophageal reflux disease without esophagitis: Secondary | ICD-10-CM | POA: Diagnosis not present

## 2024-01-17 DIAGNOSIS — R0609 Other forms of dyspnea: Secondary | ICD-10-CM | POA: Diagnosis not present

## 2024-01-17 DIAGNOSIS — R079 Chest pain, unspecified: Secondary | ICD-10-CM | POA: Diagnosis not present

## 2024-01-17 DIAGNOSIS — E11649 Type 2 diabetes mellitus with hypoglycemia without coma: Secondary | ICD-10-CM | POA: Diagnosis not present

## 2024-01-17 DIAGNOSIS — T383X5A Adverse effect of insulin and oral hypoglycemic [antidiabetic] drugs, initial encounter: Secondary | ICD-10-CM | POA: Diagnosis not present

## 2024-01-17 DIAGNOSIS — N1831 Chronic kidney disease, stage 3a: Secondary | ICD-10-CM | POA: Diagnosis not present

## 2024-01-17 DIAGNOSIS — H8101 Meniere's disease, right ear: Secondary | ICD-10-CM | POA: Diagnosis not present

## 2024-01-17 DIAGNOSIS — H9191 Unspecified hearing loss, right ear: Secondary | ICD-10-CM | POA: Diagnosis not present

## 2024-01-17 DIAGNOSIS — Z79899 Other long term (current) drug therapy: Secondary | ICD-10-CM | POA: Diagnosis not present

## 2024-01-17 DIAGNOSIS — Z7984 Long term (current) use of oral hypoglycemic drugs: Secondary | ICD-10-CM | POA: Diagnosis not present

## 2024-01-17 DIAGNOSIS — R5383 Other fatigue: Secondary | ICD-10-CM | POA: Diagnosis not present

## 2024-01-17 DIAGNOSIS — I2510 Atherosclerotic heart disease of native coronary artery without angina pectoris: Secondary | ICD-10-CM | POA: Diagnosis not present

## 2024-01-17 DIAGNOSIS — Z7982 Long term (current) use of aspirin: Secondary | ICD-10-CM | POA: Diagnosis not present

## 2024-01-17 DIAGNOSIS — I129 Hypertensive chronic kidney disease with stage 1 through stage 4 chronic kidney disease, or unspecified chronic kidney disease: Secondary | ICD-10-CM | POA: Diagnosis not present

## 2024-01-17 DIAGNOSIS — E039 Hypothyroidism, unspecified: Secondary | ICD-10-CM | POA: Diagnosis not present

## 2024-01-17 DIAGNOSIS — R0789 Other chest pain: Secondary | ICD-10-CM | POA: Diagnosis not present

## 2024-01-18 DIAGNOSIS — N1831 Chronic kidney disease, stage 3a: Secondary | ICD-10-CM | POA: Diagnosis not present

## 2024-01-18 DIAGNOSIS — R0602 Shortness of breath: Secondary | ICD-10-CM | POA: Diagnosis not present

## 2024-01-18 DIAGNOSIS — Z79899 Other long term (current) drug therapy: Secondary | ICD-10-CM | POA: Diagnosis not present

## 2024-01-18 DIAGNOSIS — E162 Hypoglycemia, unspecified: Secondary | ICD-10-CM | POA: Diagnosis not present

## 2024-01-18 DIAGNOSIS — I2510 Atherosclerotic heart disease of native coronary artery without angina pectoris: Secondary | ICD-10-CM | POA: Diagnosis not present

## 2024-01-18 DIAGNOSIS — I129 Hypertensive chronic kidney disease with stage 1 through stage 4 chronic kidney disease, or unspecified chronic kidney disease: Secondary | ICD-10-CM | POA: Diagnosis not present

## 2024-01-18 DIAGNOSIS — E039 Hypothyroidism, unspecified: Secondary | ICD-10-CM | POA: Diagnosis not present

## 2024-01-18 DIAGNOSIS — H8109 Meniere's disease, unspecified ear: Secondary | ICD-10-CM | POA: Diagnosis not present

## 2024-01-18 DIAGNOSIS — E1122 Type 2 diabetes mellitus with diabetic chronic kidney disease: Secondary | ICD-10-CM | POA: Diagnosis not present

## 2024-05-08 DIAGNOSIS — Z794 Long term (current) use of insulin: Secondary | ICD-10-CM | POA: Diagnosis not present

## 2024-05-08 DIAGNOSIS — Z7984 Long term (current) use of oral hypoglycemic drugs: Secondary | ICD-10-CM | POA: Diagnosis not present

## 2024-05-08 DIAGNOSIS — M79602 Pain in left arm: Secondary | ICD-10-CM | POA: Diagnosis not present

## 2024-05-08 DIAGNOSIS — E119 Type 2 diabetes mellitus without complications: Secondary | ICD-10-CM | POA: Diagnosis not present

## 2024-05-08 DIAGNOSIS — S4992XA Unspecified injury of left shoulder and upper arm, initial encounter: Secondary | ICD-10-CM | POA: Diagnosis not present

## 2024-05-08 DIAGNOSIS — S60011A Contusion of right thumb without damage to nail, initial encounter: Secondary | ICD-10-CM | POA: Diagnosis not present

## 2024-05-08 DIAGNOSIS — M1811 Unilateral primary osteoarthritis of first carpometacarpal joint, right hand: Secondary | ICD-10-CM | POA: Diagnosis not present

## 2024-05-08 DIAGNOSIS — S60012A Contusion of left thumb without damage to nail, initial encounter: Secondary | ICD-10-CM | POA: Diagnosis not present

## 2024-05-08 DIAGNOSIS — M25522 Pain in left elbow: Secondary | ICD-10-CM | POA: Diagnosis not present

## 2024-05-08 DIAGNOSIS — I10 Essential (primary) hypertension: Secondary | ICD-10-CM | POA: Diagnosis not present

## 2024-05-08 DIAGNOSIS — M25512 Pain in left shoulder: Secondary | ICD-10-CM | POA: Diagnosis not present

## 2024-05-08 DIAGNOSIS — Z7982 Long term (current) use of aspirin: Secondary | ICD-10-CM | POA: Diagnosis not present

## 2024-05-24 DIAGNOSIS — E119 Type 2 diabetes mellitus without complications: Secondary | ICD-10-CM | POA: Diagnosis not present

## 2024-05-24 DIAGNOSIS — M25539 Pain in unspecified wrist: Secondary | ICD-10-CM | POA: Diagnosis not present

## 2024-05-24 DIAGNOSIS — Z299 Encounter for prophylactic measures, unspecified: Secondary | ICD-10-CM | POA: Diagnosis not present

## 2024-05-24 DIAGNOSIS — M25519 Pain in unspecified shoulder: Secondary | ICD-10-CM | POA: Diagnosis not present

## 2024-05-24 DIAGNOSIS — R110 Nausea: Secondary | ICD-10-CM | POA: Diagnosis not present

## 2024-05-24 DIAGNOSIS — I10 Essential (primary) hypertension: Secondary | ICD-10-CM | POA: Diagnosis not present

## 2024-06-16 ENCOUNTER — Other Ambulatory Visit: Payer: Self-pay | Admitting: Internal Medicine

## 2024-06-16 DIAGNOSIS — Z1231 Encounter for screening mammogram for malignant neoplasm of breast: Secondary | ICD-10-CM

## 2024-06-25 DIAGNOSIS — R911 Solitary pulmonary nodule: Secondary | ICD-10-CM | POA: Diagnosis not present

## 2024-06-25 DIAGNOSIS — G629 Polyneuropathy, unspecified: Secondary | ICD-10-CM | POA: Diagnosis not present

## 2024-06-25 DIAGNOSIS — R1012 Left upper quadrant pain: Secondary | ICD-10-CM | POA: Diagnosis not present

## 2024-06-25 DIAGNOSIS — E1140 Type 2 diabetes mellitus with diabetic neuropathy, unspecified: Secondary | ICD-10-CM | POA: Diagnosis not present

## 2024-06-25 DIAGNOSIS — I129 Hypertensive chronic kidney disease with stage 1 through stage 4 chronic kidney disease, or unspecified chronic kidney disease: Secondary | ICD-10-CM | POA: Diagnosis not present

## 2024-06-25 DIAGNOSIS — R079 Chest pain, unspecified: Secondary | ICD-10-CM | POA: Diagnosis not present

## 2024-06-25 DIAGNOSIS — Z888 Allergy status to other drugs, medicaments and biological substances status: Secondary | ICD-10-CM | POA: Diagnosis not present

## 2024-06-25 DIAGNOSIS — R0789 Other chest pain: Secondary | ICD-10-CM | POA: Diagnosis not present

## 2024-06-25 DIAGNOSIS — M25519 Pain in unspecified shoulder: Secondary | ICD-10-CM | POA: Diagnosis not present

## 2024-06-25 DIAGNOSIS — Z7984 Long term (current) use of oral hypoglycemic drugs: Secondary | ICD-10-CM | POA: Diagnosis not present

## 2024-06-25 DIAGNOSIS — M25559 Pain in unspecified hip: Secondary | ICD-10-CM | POA: Diagnosis not present

## 2024-06-25 DIAGNOSIS — E039 Hypothyroidism, unspecified: Secondary | ICD-10-CM | POA: Diagnosis not present

## 2024-06-25 DIAGNOSIS — E1122 Type 2 diabetes mellitus with diabetic chronic kidney disease: Secondary | ICD-10-CM | POA: Diagnosis not present

## 2024-06-25 DIAGNOSIS — Z79899 Other long term (current) drug therapy: Secondary | ICD-10-CM | POA: Diagnosis not present

## 2024-06-25 DIAGNOSIS — G8929 Other chronic pain: Secondary | ICD-10-CM | POA: Diagnosis not present

## 2024-06-25 DIAGNOSIS — Z7982 Long term (current) use of aspirin: Secondary | ICD-10-CM | POA: Diagnosis not present

## 2024-06-25 DIAGNOSIS — R296 Repeated falls: Secondary | ICD-10-CM | POA: Diagnosis not present

## 2024-06-28 ENCOUNTER — Encounter

## 2024-07-06 DIAGNOSIS — E1165 Type 2 diabetes mellitus with hyperglycemia: Secondary | ICD-10-CM | POA: Diagnosis not present

## 2024-07-06 DIAGNOSIS — M25552 Pain in left hip: Secondary | ICD-10-CM | POA: Diagnosis not present

## 2024-07-06 DIAGNOSIS — M549 Dorsalgia, unspecified: Secondary | ICD-10-CM | POA: Diagnosis not present

## 2024-07-06 DIAGNOSIS — I10 Essential (primary) hypertension: Secondary | ICD-10-CM | POA: Diagnosis not present

## 2024-07-06 DIAGNOSIS — Z299 Encounter for prophylactic measures, unspecified: Secondary | ICD-10-CM | POA: Diagnosis not present

## 2024-07-06 DIAGNOSIS — Y92009 Unspecified place in unspecified non-institutional (private) residence as the place of occurrence of the external cause: Secondary | ICD-10-CM | POA: Diagnosis not present

## 2024-07-20 ENCOUNTER — Inpatient Hospital Stay: Admission: RE | Admit: 2024-07-20 | Source: Ambulatory Visit

## 2024-07-21 DIAGNOSIS — Z0000 Encounter for general adult medical examination without abnormal findings: Secondary | ICD-10-CM | POA: Diagnosis not present

## 2024-07-21 DIAGNOSIS — E1165 Type 2 diabetes mellitus with hyperglycemia: Secondary | ICD-10-CM | POA: Diagnosis not present

## 2024-07-21 DIAGNOSIS — I10 Essential (primary) hypertension: Secondary | ICD-10-CM | POA: Diagnosis not present

## 2024-07-21 DIAGNOSIS — Z299 Encounter for prophylactic measures, unspecified: Secondary | ICD-10-CM | POA: Diagnosis not present

## 2024-07-21 DIAGNOSIS — Z7189 Other specified counseling: Secondary | ICD-10-CM | POA: Diagnosis not present

## 2024-07-21 DIAGNOSIS — R52 Pain, unspecified: Secondary | ICD-10-CM | POA: Diagnosis not present

## 2024-08-09 ENCOUNTER — Inpatient Hospital Stay: Admission: RE | Admit: 2024-08-09 | Source: Ambulatory Visit

## 2024-08-24 ENCOUNTER — Ambulatory Visit
Admission: RE | Admit: 2024-08-24 | Discharge: 2024-08-24 | Disposition: A | Discharge status: Home / Self Care | Source: Ambulatory Visit | Attending: Internal Medicine | Admitting: Internal Medicine

## 2024-08-24 DIAGNOSIS — Z1231 Encounter for screening mammogram for malignant neoplasm of breast: Secondary | ICD-10-CM
